# Patient Record
Sex: Female | Born: 1940
Health system: Southern US, Community
[De-identification: ages and names within clinical notes are randomized; demographics above are authoritative.]

## PROBLEM LIST (undated history)

## (undated) DIAGNOSIS — M545 Low back pain: Secondary | ICD-10-CM

## (undated) DIAGNOSIS — J45909 Unspecified asthma, uncomplicated: Secondary | ICD-10-CM

## (undated) DIAGNOSIS — R05 Cough: Secondary | ICD-10-CM

## (undated) DIAGNOSIS — J209 Acute bronchitis, unspecified: Secondary | ICD-10-CM

## (undated) DIAGNOSIS — S6000XA Contusion of unspecified finger without damage to nail, initial encounter: Secondary | ICD-10-CM

## (undated) DIAGNOSIS — R0600 Dyspnea, unspecified: Secondary | ICD-10-CM

## (undated) DIAGNOSIS — D509 Iron deficiency anemia, unspecified: Secondary | ICD-10-CM

## (undated) DIAGNOSIS — M6740 Ganglion, unspecified site: Secondary | ICD-10-CM

## (undated) DIAGNOSIS — N959 Unspecified menopausal and perimenopausal disorder: Secondary | ICD-10-CM

## (undated) DIAGNOSIS — I10 Essential (primary) hypertension: Secondary | ICD-10-CM

## (undated) DIAGNOSIS — E785 Hyperlipidemia, unspecified: Secondary | ICD-10-CM

## (undated) DIAGNOSIS — Z853 Personal history of malignant neoplasm of breast: Secondary | ICD-10-CM

## (undated) DIAGNOSIS — M25569 Pain in unspecified knee: Secondary | ICD-10-CM

## (undated) DIAGNOSIS — R21 Rash and other nonspecific skin eruption: Secondary | ICD-10-CM

## (undated) DIAGNOSIS — M25519 Pain in unspecified shoulder: Secondary | ICD-10-CM

## (undated) DIAGNOSIS — R5383 Other fatigue: Secondary | ICD-10-CM

## (undated) DIAGNOSIS — C50919 Malignant neoplasm of unspecified site of unspecified female breast: Secondary | ICD-10-CM

## (undated) DIAGNOSIS — R5381 Other malaise: Secondary | ICD-10-CM

## (undated) DIAGNOSIS — Z9289 Personal history of other medical treatment: Secondary | ICD-10-CM

## (undated) DIAGNOSIS — E538 Deficiency of other specified B group vitamins: Secondary | ICD-10-CM

## (undated) HISTORY — DX: Other fatigue: R53.83

## (undated) HISTORY — DX: Pain in unspecified shoulder: M25.519

## (undated) HISTORY — DX: Other malaise: R53.81

## (undated) HISTORY — DX: Low back pain: M54.5

## (undated) HISTORY — DX: Rash and other nonspecific skin eruption: R21

## (undated) HISTORY — DX: Contusion of unspecified finger without damage to nail, initial encounter: S60.00XA

## (undated) HISTORY — DX: Hyperlipidemia, unspecified: E78.5

## (undated) HISTORY — DX: Ganglion, unspecified site: M67.40

## (undated) HISTORY — DX: Cough: R05

## (undated) HISTORY — PX: OTHER SURGICAL HISTORY: SHX169

## (undated) HISTORY — DX: Essential (primary) hypertension: I10

## (undated) HISTORY — DX: Pain in unspecified knee: M25.569

## (undated) HISTORY — DX: Personal history of malignant neoplasm of breast: Z85.3

## (undated) HISTORY — DX: Acute bronchitis, unspecified: J20.9

## (undated) HISTORY — DX: Iron deficiency anemia, unspecified: D50.9

## (undated) HISTORY — DX: Deficiency of other specified B group vitamins: E53.8

## (undated) HISTORY — DX: Unspecified menopausal and perimenopausal disorder: N95.9

## (undated) HISTORY — DX: Unspecified asthma, uncomplicated: J45.909

---

## 1998-12-31 ENCOUNTER — Emergency Department (HOSPITAL_COMMUNITY): Admission: EM | Admit: 1998-12-31 | Discharge: 1999-01-01 | Payer: Self-pay

## 2000-12-26 ENCOUNTER — Emergency Department (HOSPITAL_COMMUNITY): Admission: EM | Admit: 2000-12-26 | Discharge: 2000-12-26 | Payer: Self-pay | Admitting: Internal Medicine

## 2003-01-25 ENCOUNTER — Emergency Department (HOSPITAL_COMMUNITY): Admission: EM | Admit: 2003-01-25 | Discharge: 2003-01-25 | Payer: Self-pay | Admitting: Emergency Medicine

## 2003-01-25 ENCOUNTER — Encounter: Payer: Self-pay | Admitting: Emergency Medicine

## 2003-01-31 ENCOUNTER — Emergency Department (HOSPITAL_COMMUNITY): Admission: EM | Admit: 2003-01-31 | Discharge: 2003-01-31 | Payer: Self-pay | Admitting: *Deleted

## 2005-03-11 ENCOUNTER — Ambulatory Visit: Payer: Self-pay | Admitting: Internal Medicine

## 2005-03-18 ENCOUNTER — Ambulatory Visit: Payer: Self-pay | Admitting: Family Medicine

## 2005-04-27 ENCOUNTER — Ambulatory Visit: Payer: Self-pay | Admitting: Internal Medicine

## 2005-06-09 ENCOUNTER — Ambulatory Visit: Payer: Self-pay | Admitting: Internal Medicine

## 2005-07-21 ENCOUNTER — Ambulatory Visit: Payer: Self-pay | Admitting: Internal Medicine

## 2005-09-04 ENCOUNTER — Emergency Department (HOSPITAL_COMMUNITY): Admission: EM | Admit: 2005-09-04 | Discharge: 2005-09-04 | Payer: Self-pay | Admitting: Emergency Medicine

## 2005-12-23 ENCOUNTER — Ambulatory Visit: Payer: Self-pay | Admitting: Internal Medicine

## 2006-09-16 ENCOUNTER — Ambulatory Visit: Payer: Self-pay | Admitting: Internal Medicine

## 2006-09-16 LAB — CONVERTED CEMR LAB
ALT: 16 units/L (ref 0–40)
Basophils Relative: 0.2 % (ref 0.0–1.0)
CO2: 28 meq/L (ref 19–32)
Chloride: 103 meq/L (ref 96–112)
Creatinine, Ser: 0.8 mg/dL (ref 0.4–1.2)
Glucose, Bld: 90 mg/dL (ref 70–99)
Hemoglobin: 11.5 g/dL — ABNORMAL LOW (ref 12.0–15.0)
Ketones, ur: NEGATIVE mg/dL
Leukocytes, UA: NEGATIVE
Lymphocytes Relative: 23.5 % (ref 12.0–46.0)
MCHC: 32.7 g/dL (ref 30.0–36.0)
Monocytes Absolute: 0.4 10*3/uL (ref 0.2–0.7)
Monocytes Relative: 5.6 % (ref 3.0–11.0)
Mucus, UA: NEGATIVE
Neutro Abs: 4.5 10*3/uL (ref 1.4–7.7)
Platelets: 245 10*3/uL (ref 150–400)
Specific Gravity, Urine: 1.015 (ref 1.000–1.03)
TSH: 0.47 microintl units/mL (ref 0.35–5.50)
Urine Glucose: NEGATIVE mg/dL
Urobilinogen, UA: 0.2 (ref 0.0–1.0)
WBC number, urine, microscopy: NONE SEEN /hpf
pH: 7.5 (ref 5.0–8.0)

## 2006-10-24 ENCOUNTER — Ambulatory Visit: Payer: Self-pay | Admitting: Internal Medicine

## 2006-12-23 ENCOUNTER — Ambulatory Visit: Payer: Self-pay | Admitting: Internal Medicine

## 2006-12-23 LAB — CONVERTED CEMR LAB
AST: 21 units/L (ref 0–37)
Albumin: 3.6 g/dL (ref 3.5–5.2)
Bilirubin, Direct: 0.1 mg/dL (ref 0.0–0.3)
Folate: >20 ng/mL
Iron: 36 ug/dL — ABNORMAL LOW (ref 42–145)
Vitamin B-12: 313 pg/mL (ref 211–911)

## 2006-12-28 ENCOUNTER — Ambulatory Visit: Payer: Self-pay | Admitting: Internal Medicine

## 2006-12-28 LAB — CONVERTED CEMR LAB
ALT: 14 units/L (ref 0–40)
Alkaline Phosphatase: 74 units/L (ref 39–117)
BUN: 15 mg/dL (ref 6–23)
Basophils Relative: 0.5 % (ref 0.0–1.0)
CO2: 33 meq/L — ABNORMAL HIGH (ref 19–32)
Calcium: 9.1 mg/dL (ref 8.4–10.5)
Creatinine, Ser: 0.7 mg/dL (ref 0.4–1.2)
Eosinophils Relative: 2.4 % (ref 0.0–5.0)
Glucose, Bld: 93 mg/dL (ref 70–99)
Hemoglobin: 11.9 g/dL — ABNORMAL LOW (ref 12.0–15.0)
Monocytes Absolute: 0.5 10*3/uL (ref 0.2–0.7)
Monocytes Relative: 6.5 % (ref 3.0–11.0)
Platelets: 278 10*3/uL (ref 150–400)
RDW: 11.4 % — ABNORMAL LOW (ref 11.5–14.6)
Total Bilirubin: 0.5 mg/dL (ref 0.3–1.2)
Total Protein: 7.1 g/dL (ref 6.0–8.3)
WBC: 7.7 10*3/uL (ref 4.5–10.5)

## 2007-01-11 ENCOUNTER — Ambulatory Visit: Payer: Self-pay

## 2007-01-12 ENCOUNTER — Ambulatory Visit: Payer: Self-pay

## 2007-01-17 ENCOUNTER — Encounter: Payer: Self-pay | Admitting: Cardiology

## 2007-01-17 ENCOUNTER — Ambulatory Visit: Payer: Self-pay

## 2007-01-26 ENCOUNTER — Ambulatory Visit: Payer: Self-pay | Admitting: Internal Medicine

## 2007-05-05 DIAGNOSIS — I10 Essential (primary) hypertension: Secondary | ICD-10-CM

## 2007-05-05 DIAGNOSIS — D509 Iron deficiency anemia, unspecified: Secondary | ICD-10-CM | POA: Insufficient documentation

## 2007-05-05 HISTORY — DX: Essential (primary) hypertension: I10

## 2007-05-05 HISTORY — DX: Iron deficiency anemia, unspecified: D50.9

## 2007-05-21 DIAGNOSIS — M545 Low back pain, unspecified: Secondary | ICD-10-CM | POA: Insufficient documentation

## 2007-05-21 DIAGNOSIS — E785 Hyperlipidemia, unspecified: Secondary | ICD-10-CM | POA: Insufficient documentation

## 2007-05-21 HISTORY — DX: Low back pain, unspecified: M54.50

## 2007-05-21 HISTORY — DX: Hyperlipidemia, unspecified: E78.5

## 2008-01-31 ENCOUNTER — Ambulatory Visit: Payer: Self-pay | Admitting: Internal Medicine

## 2008-01-31 DIAGNOSIS — J209 Acute bronchitis, unspecified: Secondary | ICD-10-CM

## 2008-01-31 DIAGNOSIS — J45909 Unspecified asthma, uncomplicated: Secondary | ICD-10-CM

## 2008-01-31 HISTORY — DX: Acute bronchitis, unspecified: J20.9

## 2008-01-31 HISTORY — DX: Unspecified asthma, uncomplicated: J45.909

## 2008-04-04 ENCOUNTER — Emergency Department (HOSPITAL_COMMUNITY): Admission: EM | Admit: 2008-04-04 | Discharge: 2008-04-04 | Payer: Self-pay | Admitting: Emergency Medicine

## 2008-05-10 ENCOUNTER — Ambulatory Visit: Payer: Self-pay | Admitting: Internal Medicine

## 2008-05-10 DIAGNOSIS — M25569 Pain in unspecified knee: Secondary | ICD-10-CM

## 2008-05-10 HISTORY — DX: Pain in unspecified knee: M25.569

## 2008-06-05 ENCOUNTER — Telehealth (INDEPENDENT_AMBULATORY_CARE_PROVIDER_SITE_OTHER): Payer: Self-pay | Admitting: *Deleted

## 2008-06-26 ENCOUNTER — Encounter: Payer: Self-pay | Admitting: Internal Medicine

## 2008-08-29 ENCOUNTER — Telehealth: Payer: Self-pay | Admitting: Internal Medicine

## 2008-08-29 ENCOUNTER — Encounter: Payer: Self-pay | Admitting: Internal Medicine

## 2008-09-09 ENCOUNTER — Encounter: Payer: Self-pay | Admitting: Internal Medicine

## 2008-09-12 ENCOUNTER — Encounter: Payer: Self-pay | Admitting: Internal Medicine

## 2008-09-19 ENCOUNTER — Encounter: Admission: RE | Admit: 2008-09-19 | Discharge: 2008-09-19 | Payer: Self-pay | Admitting: General Surgery

## 2008-09-24 ENCOUNTER — Encounter: Payer: Self-pay | Admitting: Internal Medicine

## 2008-10-30 ENCOUNTER — Ambulatory Visit: Payer: Self-pay | Admitting: Internal Medicine

## 2008-10-30 DIAGNOSIS — S6000XA Contusion of unspecified finger without damage to nail, initial encounter: Secondary | ICD-10-CM

## 2008-10-30 HISTORY — DX: Contusion of unspecified finger without damage to nail, initial encounter: S60.00XA

## 2008-12-29 ENCOUNTER — Emergency Department (HOSPITAL_COMMUNITY): Admission: EM | Admit: 2008-12-29 | Discharge: 2008-12-29 | Payer: Self-pay | Admitting: Emergency Medicine

## 2009-03-15 ENCOUNTER — Emergency Department (HOSPITAL_COMMUNITY): Admission: EM | Admit: 2009-03-15 | Discharge: 2009-03-15 | Payer: Self-pay | Admitting: Emergency Medicine

## 2009-05-01 ENCOUNTER — Ambulatory Visit: Payer: Self-pay | Admitting: Internal Medicine

## 2009-05-01 DIAGNOSIS — M6740 Ganglion, unspecified site: Secondary | ICD-10-CM | POA: Insufficient documentation

## 2009-05-01 DIAGNOSIS — R21 Rash and other nonspecific skin eruption: Secondary | ICD-10-CM

## 2009-05-01 HISTORY — DX: Ganglion, unspecified site: M67.40

## 2009-05-01 HISTORY — DX: Rash and other nonspecific skin eruption: R21

## 2009-05-02 ENCOUNTER — Encounter (INDEPENDENT_AMBULATORY_CARE_PROVIDER_SITE_OTHER): Payer: Self-pay | Admitting: *Deleted

## 2009-05-12 ENCOUNTER — Encounter (INDEPENDENT_AMBULATORY_CARE_PROVIDER_SITE_OTHER): Payer: Self-pay | Admitting: *Deleted

## 2009-05-22 ENCOUNTER — Ambulatory Visit: Payer: Self-pay | Admitting: Internal Medicine

## 2009-05-27 ENCOUNTER — Ambulatory Visit: Payer: Self-pay | Admitting: Internal Medicine

## 2009-05-27 ENCOUNTER — Telehealth: Payer: Self-pay | Admitting: Internal Medicine

## 2009-07-08 ENCOUNTER — Telehealth: Payer: Self-pay | Admitting: Internal Medicine

## 2009-07-10 ENCOUNTER — Encounter: Payer: Self-pay | Admitting: Internal Medicine

## 2009-10-17 ENCOUNTER — Ambulatory Visit: Payer: Self-pay | Admitting: Internal Medicine

## 2009-10-17 DIAGNOSIS — R05 Cough: Secondary | ICD-10-CM | POA: Insufficient documentation

## 2009-10-17 DIAGNOSIS — R059 Cough, unspecified: Secondary | ICD-10-CM

## 2009-10-17 HISTORY — DX: Cough, unspecified: R05.9

## 2009-11-14 ENCOUNTER — Ambulatory Visit: Payer: Self-pay | Admitting: Internal Medicine

## 2009-11-14 DIAGNOSIS — Z853 Personal history of malignant neoplasm of breast: Secondary | ICD-10-CM

## 2009-11-14 DIAGNOSIS — M25519 Pain in unspecified shoulder: Secondary | ICD-10-CM

## 2009-11-14 DIAGNOSIS — R5383 Other fatigue: Secondary | ICD-10-CM

## 2009-11-14 DIAGNOSIS — R5381 Other malaise: Secondary | ICD-10-CM

## 2009-11-14 DIAGNOSIS — C50511 Malignant neoplasm of lower-outer quadrant of right female breast: Secondary | ICD-10-CM

## 2009-11-14 HISTORY — DX: Pain in unspecified shoulder: M25.519

## 2009-11-14 HISTORY — DX: Personal history of malignant neoplasm of breast: Z85.3

## 2009-11-14 HISTORY — DX: Other malaise: R53.81

## 2010-02-04 ENCOUNTER — Emergency Department (HOSPITAL_COMMUNITY): Admission: EM | Admit: 2010-02-04 | Discharge: 2010-02-04 | Payer: Self-pay | Admitting: Emergency Medicine

## 2010-02-18 ENCOUNTER — Ambulatory Visit (HOSPITAL_COMMUNITY): Admission: RE | Admit: 2010-02-18 | Discharge: 2010-02-18 | Payer: Self-pay | Admitting: Internal Medicine

## 2010-02-24 ENCOUNTER — Ambulatory Visit: Payer: Self-pay | Admitting: Internal Medicine

## 2010-02-26 ENCOUNTER — Encounter: Admission: RE | Admit: 2010-02-26 | Discharge: 2010-02-26 | Payer: Self-pay | Admitting: Internal Medicine

## 2010-02-26 ENCOUNTER — Encounter: Payer: Self-pay | Admitting: Internal Medicine

## 2010-02-26 LAB — HM MAMMOGRAPHY

## 2010-03-05 ENCOUNTER — Encounter: Payer: Self-pay | Admitting: Internal Medicine

## 2010-05-22 ENCOUNTER — Ambulatory Visit: Payer: Self-pay | Admitting: Internal Medicine

## 2010-05-22 DIAGNOSIS — N959 Unspecified menopausal and perimenopausal disorder: Secondary | ICD-10-CM

## 2010-05-22 HISTORY — DX: Unspecified menopausal and perimenopausal disorder: N95.9

## 2010-05-22 LAB — CONVERTED CEMR LAB
BUN: 9 mg/dL (ref 6–23)
Basophils Relative: 0.2 % (ref 0.0–3.0)
Bilirubin, Direct: 0.1 mg/dL (ref 0.0–0.3)
CO2: 35 meq/L — ABNORMAL HIGH (ref 19–32)
Chloride: 103 meq/L (ref 96–112)
Cholesterol: 192 mg/dL (ref 0–200)
Creatinine, Ser: 0.6 mg/dL (ref 0.4–1.2)
Eosinophils Absolute: 0.1 10*3/uL (ref 0.0–0.7)
HCT: 33.8 % — ABNORMAL LOW (ref 36.0–46.0)
HDL: 51.1 mg/dL (ref >39.00)
Ketones, ur: NEGATIVE mg/dL
Lymphs Abs: 1.7 10*3/uL (ref 0.7–4.0)
MCHC: 33.6 g/dL (ref 30.0–36.0)
MCV: 88.3 fL (ref 78.0–100.0)
Monocytes Absolute: 0.4 10*3/uL (ref 0.1–1.0)
Neutrophils Relative %: 61 % (ref 43.0–77.0)
Platelets: 230 10*3/uL (ref 150.0–400.0)
Potassium: 4.2 meq/L (ref 3.5–5.1)
RBC: 3.83 M/uL — ABNORMAL LOW (ref 3.87–5.11)
Specific Gravity, Urine: 1.015 (ref 1.000–1.030)
Total CHOL/HDL Ratio: 4
Total Protein, Urine: NEGATIVE mg/dL
Total Protein: 6.4 g/dL (ref 6.0–8.3)
Triglycerides: 87 mg/dL (ref 0.0–149.0)
Urine Glucose: NEGATIVE mg/dL
pH: 7.5 (ref 5.0–8.0)

## 2010-05-23 DIAGNOSIS — E538 Deficiency of other specified B group vitamins: Secondary | ICD-10-CM

## 2010-05-23 HISTORY — DX: Deficiency of other specified B group vitamins: E53.8

## 2010-05-25 ENCOUNTER — Ambulatory Visit: Payer: Self-pay | Admitting: Internal Medicine

## 2010-05-25 LAB — CONVERTED CEMR LAB
Folate: 19.7 ng/mL
Iron: 61 ug/dL (ref 42–145)
Saturation Ratios: 23.1 % (ref 20.0–50.0)
Transferrin: 189 mg/dL — ABNORMAL LOW (ref 212.0–360.0)
Vitamin B-12: 179 pg/mL — ABNORMAL LOW (ref 211–911)

## 2010-05-28 ENCOUNTER — Telehealth: Payer: Self-pay | Admitting: Internal Medicine

## 2010-11-01 ENCOUNTER — Encounter: Payer: Self-pay | Admitting: General Surgery

## 2010-11-08 LAB — CONVERTED CEMR LAB
AST: 20 units/L (ref 0–37)
Alkaline Phosphatase: 73 units/L (ref 39–117)
BUN: 10 mg/dL (ref 6–23)
BUN: 10 mg/dL (ref 6–23)
Basophils Absolute: 0 10*3/uL (ref 0.0–0.1)
Basophils Absolute: 0 10*3/uL (ref 0.0–0.1)
Bilirubin Urine: NEGATIVE
CO2: 31 meq/L (ref 19–32)
Calcium: 9.2 mg/dL (ref 8.4–10.5)
Calcium: 9.2 mg/dL (ref 8.4–10.5)
Cholesterol: 154 mg/dL (ref 0–200)
Creatinine, Ser: 0.6 mg/dL (ref 0.4–1.2)
Creatinine, Ser: 0.6 mg/dL (ref 0.4–1.2)
Eosinophils Absolute: 0.1 10*3/uL (ref 0.0–0.7)
GFR calc non Af Amer: 127.42 mL/min (ref >60)
Glucose, Bld: 79 mg/dL (ref 70–99)
Glucose, Bld: 88 mg/dL (ref 70–99)
LDL Cholesterol: 102 mg/dL — ABNORMAL HIGH (ref 0–99)
Lymphocytes Relative: 31.5 % (ref 12.0–46.0)
Lymphocytes Relative: 34.2 % (ref 12.0–46.0)
Lymphs Abs: 2.1 10*3/uL (ref 0.7–4.0)
MCHC: 33.2 g/dL (ref 30.0–36.0)
Monocytes Relative: 5.8 % (ref 3.0–12.0)
Neutrophils Relative %: 57.3 % (ref 43.0–77.0)
Neutrophils Relative %: 60.1 % (ref 43.0–77.0)
Nitrite: NEGATIVE
Platelets: 220 10*3/uL (ref 150.0–400.0)
Platelets: 222 10*3/uL (ref 150.0–400.0)
RDW: 11 % — ABNORMAL LOW (ref 11.5–14.6)
RDW: 11.3 % — ABNORMAL LOW (ref 11.5–14.6)
Saturation Ratios: 38 % (ref 20.0–50.0)
Total Bilirubin: 0.6 mg/dL (ref 0.3–1.2)
Total Protein, Urine: NEGATIVE mg/dL
Transferrin: 156.2 mg/dL — ABNORMAL LOW (ref 212.0–360.0)
VLDL: 11.2 mg/dL (ref 0.0–40.0)

## 2010-11-12 NOTE — Assessment & Plan Note (Signed)
Summary: B12/JOHN/CD   Nurse Visit   Allergies: No Known Drug Allergies  Medication Administration  Injection # 1:    Medication: Vit B12 1000 mcg    Diagnosis: VITAMIN B12 DEFICIENCY (ICD-266.2)    Route: IM    Site: L deltoid    Exp Date: 01/2012    Lot #: [id number]    Mfr: [employer]    Patient tolerated injection without complications    Given by: Zella Ball Ewing CMA (AAMA) (May 25, 2010 11:01 AM)  Orders Added: 1)  Vit B12 1000 mcg [J3420] 2)  Admin of Therapeutic Inj  intramuscular or subcutaneous [16109]

## 2010-11-12 NOTE — Letter (Signed)
Summary: Staff Medical Report  Staff Medical Report   Imported By: Sherian Rein 11/17/2009 09:47:51  _____________________________________________________________________  External Attachment:    Type:   Image     Comment:   External Document

## 2010-11-12 NOTE — Assessment & Plan Note (Signed)
Summary: SNEEZING/NWS   Vital Signs:  Patient profile:   70 year old female Height:      65 inches Weight:      232 pounds BMI:     38.75 O2 Sat:      97 % on Room air Temp:     97.7 degrees F oral Pulse rate:   68 / minute BP sitting:   140 / 78  (left arm) Cuff size:   large  Vitals Entered ByMarland Kitchen Zella Ball Ewing (October 17, 2009 2:48 PM)  O2 Flow:  Room air CC: sneezing,cough/RE   CC:  sneezing and cough/RE.  History of Present Illness: here with several days recent sneezing, cough and headache with hx of pna and worried she might be going there again , although she has no fever, ST, sob/dpe and Pt denies CP, sob, doe, wheezing, orthopnea, pnd, worsening LE edema, palps, dizziness or syncope ; also mentions peak wt was 270, now down another 3 lbs since last visit to current with cont'd better diet and excercise;  Also mentions despite this she has onset mild left knee pain diffusely it seems more anteromedially but without effusion or decreased ROM,  denies fever , truama, falls or hx of gout.  No twisting or recent increased excercise.  Pt denies new neuro symptoms such as headache, facial or extremity weakness   Problems Prior to Update: 1)  Rash-nonvesicular  (ICD-782.1) 2)  Preventive Health Care  (ICD-V70.0) 3)  Ganglion Cyst  (ICD-727.43) 4)  Rash-nonvesicular  (ICD-782.1) 5)  Subungual Hematoma  (ICD-923.3) 6)  Knee Pain, Bilateral  (ICD-719.46) 7)  Bronchitis, Acute  (ICD-466.0) 8)  Asthma  (ICD-493.90) 9)  Family History of Asthma  (ICD-V17.5) 10)  Hyperlipidemia  (ICD-272.4) 11)  Low Back Pain  (ICD-724.2) 12)  Hypertension  (ICD-401.9) 13)  Anemia-iron Deficiency  (ICD-280.9)  Medications Prior to Update: 1)  Lotrisone 1-0.05 % Crea (Clotrimazole-Betamethasone) .... Use Asd Two Times A Day As Needed  Current Medications (verified): 1)  Lotrisone 1-0.05 % Crea (Clotrimazole-Betamethasone) .... 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   Head:  normocephalic and atraumatic.   Eyes:  vision grossly intact, pupils equal, and pupils round.   Ears:  R ear normal and L ear normal.   Nose:  no external deformity and no nasal discharge.   Mouth:  no gingival abnormalities and pharynx pink and moist.   Neck:  supple and no masses.   Lungs:  normal respiratory effort and normal breath sounds.   Heart:  normal rate and regular rhythm.   Msk:  no joint tenderness, no joint swelling, and no joint warmth.  and right knee with mild crepitus but good ROM and no effusion Extremities:  no edema, no erythema    Impression & Recommendations:  Problem # 1:  COUGH (ICD-786.2) most likely due to allergy I suspect with benign exam today; ok for claritin or similar otc as needed;  no evidence infection  today, ok to follow, pt reaasured  Problem # 2:  KNEE PAIN, LEFT (ICD-719.46) prob DJD, mild without effusion, for tylenol as needed   Problem # 3:  HYPERTENSION (ICD-401.9)  BP today: 140/78 Prior BP: 138/84 (05/27/2009)  Labs Reviewed: K+: 3.7 (05/22/2009) Creat: : 0.6 (05/22/2009)   Chol: 154 (05/22/2009)   HDL: 40.90 (05/22/2009)   LDL: 102 (05/22/2009)   TG: 56.0 (05/22/2009) stable overall by hx and exam, ok to continue meds/tx as is   Complete Medication List: 1)  Lotrisone 1-0.05 % Crea (Clotrimazole-betamethasone) .... Use asd two times a day as needed  Patient Instructions: 1)   Continue all previous medications as before this visit  2)  you can take Tylenol Arthritis for the knee pains 3)  please followup in August 2010 with CPX labs

## 2010-11-12 NOTE — Assessment & Plan Note (Signed)
Summary: ?exposure to somebody w/MRSA/cd   Vital Signs:  Patient profile:   70 year old female Height:      65 inches Weight:      219.50 pounds BMI:     36.66 O2 Sat:      96 % on Room air Temp:     97.5 degrees F oral Pulse rate:   67 / minute BP sitting:   130 / 80  (left arm) Cuff size:   large  Vitals Entered By: Zella Ball Ewing CMA (AAMA) (May 22, 2010 1:14 PM)  O2 Flow:  Room air  CC: Exposed to MRSA, labs/RE   CC:  Exposed to MRSA and labs/RE.  History of Present Illness: here to f/u, unfort has not seen gen surgeon in f/u for known right breast cancer invasive distal;  today mentions she had some exposure to a person with known MRSA but not sure about direct contact and denies fever, ST, cough, skin boil or GU symtpoms or other infectious symptoms.   Does have significant worse left knee pain iwth medial swelling with limited walking;  has sees [place] [facility] in the past with cortione shot, and is reluctant, but is willing to go back due to the pain now, and seems somewhat unstable to walk, like it might give way and fall.  Pt denies CP, sob, doe, wheezing, orthopnea, pnd, worsening LE edema, palps, dizziness or syncope  Pt denies new neuro symptoms such as headache, facial or extremity weakness   Problems Prior to Update: 1)  Shoulder Pain, Left  (ICD-719.41) 2)  Fatigue  (ICD-780.79) 3)  Breast Cancer, Hx of  (ICD-V10.3) 4)  Knee Pain, Left  (ICD-719.46) 5)  Cough  (ICD-786.2) 6)  Rash-nonvesicular  (ICD-782.1) 7)  Preventive Health Care  (ICD-V70.0) 8)  Ganglion Cyst  (ICD-727.43) 9)  Rash-nonvesicular  (ICD-782.1) 10)  Subungual Hematoma  (ICD-923.3) 11)  Knee Pain, Bilateral  (ICD-719.46) 12)  Bronchitis, Acute  (ICD-466.0) 13)  Asthma  (ICD-493.90) 14)  Family History of Asthma  (ICD-V17.5) 15)  Hyperlipidemia  (ICD-272.4) 16)  Low Back Pain  (ICD-724.2) 17)  Hypertension  (ICD-401.9) 18)  Anemia-iron Deficiency  (ICD-280.9)  Medications Prior to Update: 1)   Lotrisone 1-0.05 % Crea (Clotrimazole-Betamethasone) .... Use Asd Two Times A Day As Needed  Current Medications (verified): 1)  Lotrisone 1-0.05 % Crea (Clotrimazole-Betamethasone) .... 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         all otherwise negative per pt -    Physical Exam  General:  alert  and overweight-appearing.   Head:  normocephalic and atraumatic.   Eyes:  vision grossly intact, pupils equal, and pupils round.   Ears:  R ear normal and L ear normal.   Nose:  no external deformity and no nasal discharge.   Mouth:  no gingival abnormalities and pharynx pink and moist.   Neck:  supple and no masses.   Lungs:  normal respiratory effort and normal breath sounds.   Heart:  normal rate and regular rhythm.     Abdomen:  soft, non-tender, and normal bowel sounds.   Msk:  no joint tenderness and no joint swelling.  except for left knee mod crepitus with FROM but with medial tenderness and localize sweling Extremities:  no edema, no erythema  Neurologic:  cranial nerves II-XII intact and strength normal in all extremities.   Skin:  color normal and no rashes.   Psych:  not depressed appearing and moderately anxious.     Impression & Recommendations:  Problem # 1:  Preventive Health Care (ICD-V70.0)  Overall doing well, age appropriate education and counseling updated and referral for appropriate preventive services done unless declined, immunizations up to date or declined, diet counseling done if overweight, urged to quit smoking if smokes , most recent labs reviewed and current ordered if appropriate, ecg reviewed or declined (interpretation per ECG scanned in the EMR if done); information regarding Medicare Prevention requirements given if appropriate; speciality referrals updated as appropriate   Orders: TLB-BMP (Basic Metabolic Panel-BMET) (80048-METABOL) TLB-CBC Platelet - w/Differential (85025-CBCD) TLB-Hepatic/Liver Function Pnl (80076-HEPATIC) TLB-Lipid Panel (80061-LIPID) TLB-TSH (Thyroid Stimulating Hormone) (84443-TSH) TLB-Udip ONLY (81003-UDIP)  Problem # 2:  BREAST CANCER, HX OF (ICD-V10.3)  with known recurrence per MRI may 2011 - still needs to see Dr Derrell Lolling, I will refer   Orders: Surgical Referral (Surgery)  Problem # 3:  KNEE PAIN, BILATERAL (ICD-719.46)  needs f/u with incr pain and ? cystic mass to the left medial knee;  will refer back to [place] [facility]  Orders: Orthopedic Surgeon Referral (Ortho Surgeon)  Problem # 4:  MENOPAUSAL DISORDER (ICD-627.9)  due for dxa  Orders: T-Bone Densitometry (16109)  Complete Medication List: 1)  Lotrisone 1-0.05 % Crea (Clotrimazole-betamethasone) .... Use asd two times a day as needed  Patient Instructions: 1)  You will be  contacted about the referral(s) to: Gen Surgeon, and orthopedic 2)  Please go to the Lab in the basement for your blood and/or urine tests today 3)  please schedule your bone density before leaving today 4)  you are given the handicap parking form today 5)  Please schedule a follow-up appointment in 6 months or sooner if needed

## 2010-11-12 NOTE — Letter (Signed)
Summary: [facility]   Imported By: Lester [place] 03/11/2010 10:23:30  _____________________________________________________________________  External Attachment:    Type:   Image     Comment:   External Document

## 2010-11-12 NOTE — Progress Notes (Signed)
Summary: breast cancer referral  Phone Note From Other Clinic Call back at [phone]   Caller: Edie  Request: Talk with Telvin Reinders Summary of Call: Dr. Jacinto Halim office called about pt being non-compliant and not keeping any of her appointments. She was referred in May for her breast cancer and she has not come in yet. They are contacting the office to see if we can contact her so that she can schedule an appointment. Initial call taken by: Brenton Grills MA,  May 28, 2010 9:16 AM  Follow-up for Phone Call        noted -  Follow-up by: Corwin Levins MD,  May 28, 2010 10:13 AM

## 2010-11-12 NOTE — Miscellaneous (Signed)
Summary: BONE DENSITY  Clinical Lists Changes  Orders: Added new Test order of T-Lumbar Vertebral Assessment (77082) - Signed 

## 2010-11-12 NOTE — Letter (Signed)
Summary: [facility]   Imported By: Lester [place] 03/18/2010 09:51:46  _____________________________________________________________________  External Attachment:    Type:   Image     Comment:   External Document

## 2010-11-12 NOTE — Assessment & Plan Note (Signed)
Summary: YEARLY FU/ LABS AFTER / NWS  #   Vital Signs:  Patient profile:   70 year old female Height:      65 inches Weight:      228 pounds BMI:     38.08 O2 Sat:      97 % on Room air Temp:     98.2 degrees F oral Pulse rate:   58 / minute BP sitting:   142 / 74  (left arm) Cuff size:   large  Vitals Entered ByZella Ball Ewing (November 14, 2009 8:52 AM)  O2 Flow:  Room air CC: yearly/RE   CC:  yearly/RE.  History of Present Illness: here after being diagnosed dec 2009 with met breast cancer to right axilla and has been lost to f/u preferring to trying diet, but now wants to be staged and treated;  no obvious further symtpoms such as incresed axillary mass, pulm or bony sympotms such as sob or pain;  Pt denies CP, sob, doe, wheezing, orthopnea, pnd, worsening LE edema, palps, dizziness or syncope  Pt denies new neuro symptoms such as headache, facial or extremity weakness   Declines flu shot today.  Needs a form completed for work purposes, and also with gradually worsening x 4 to 5 months left shoudler pain assoc with a hard knot , with pain worse to forward elevated and abduct and has some limited ROM with this aswell, without hx of fall or injury or known bony mets.  Plans to retire in 6 months .    Problems Prior to Update: 1)  Shoulder Pain, Left  (ICD-719.41) 2)  Fatigue  (ICD-780.79) 3)  Breast Cancer, Hx of  (ICD-V10.3) 4)  Knee Pain, Left  (ICD-719.46) 5)  Cough  (ICD-786.2) 6)  Rash-nonvesicular  (ICD-782.1) 7)  Preventive Health Care  (ICD-V70.0) 8)  Ganglion Cyst  (ICD-727.43) 9)  Rash-nonvesicular  (ICD-782.1) 10)  Subungual Hematoma  (ICD-923.3) 11)  Knee Pain, Bilateral  (ICD-719.46) 12)  Bronchitis, Acute  (ICD-466.0) 13)  Asthma  (ICD-493.90) 14)  Family History of Asthma  (ICD-V17.5) 15)  Hyperlipidemia  (ICD-272.4) 16)  Low Back Pain  (ICD-724.2) 17)  Hypertension  (ICD-401.9) 18)  Anemia-iron Deficiency  (ICD-280.9)  Medications Prior to Update: 1)   Lotrisone 1-0.05 % Crea (Clotrimazole-Betamethasone) .... Use Asd Two Times A Day As Needed  Current Medications (verified): 1)  Lotrisone 1-0.05 % Crea (Clotrimazole-Betamethasone) .... 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   Head:  normocephalic and atraumatic.   Eyes:  vision grossly intact, pupils equal, and pupils round.   Ears:  R ear normal and L ear normal.   Nose:  no external deformity and no nasal discharge.   Mouth:  no gingival abnormalities and pharynx pink and moist.   Neck:  supple and no masses.   Lungs:  normal respiratory effort and normal breath sounds.  Heart:  normal rate and regular rhythm.   Abdomen:  soft, non-tender, normal bowel sounds, no hepatomegaly, and no splenomegaly.   Msk:  no joint tenderness and no joint swelling.  except a tender bony enlargement to the area at or near the left [facility] joint Extremities:  no edema, no erythema  Neurologic:  alert & oriented X3 and cranial nerves II-XII intact.   Cervical Nodes:  no anterior cervical adenopathy and no posterior cervical adenopathy.   Axillary Nodes:  not done Inguinal Nodes:  not done   Impression & Recommendations:  Problem # 1:  BREAST CANCER, HX OF (ICD-V10.3)  ok to refer to oncology for staging and treatment, seems to have been in some kind of denial possibly; but  is convinved after I go over the nov and dec 2009 U/S, MRI and path results.    Orders: Oncology Referral (Oncology)  Problem # 2:  HYPERTENSION (ICD-401.9)  Orders: EKG w/ Interpretation (93000)  BP today: 142/74 Prior BP: 140/78 (10/17/2009)  Labs Reviewed: K+: 3.7 (05/22/2009) Creat: : 0.6 (05/22/2009)   Chol: 154 (05/22/2009)   HDL: 40.90 (05/22/2009)   LDL: 102 (05/22/2009)   TG: 56.0 (05/22/2009) persistent mild elev - declines tx at this time  Problem # 3:  FATIGUE (ICD-780.79) exam benign, to check labs below; follow with expectant management  Orders: TLB-BMP (Basic Metabolic Panel-BMET) (80048-METABOL) TLB-CBC Platelet - w/Differential (85025-CBCD) TLB-IBC Pnl (Iron/FE;Transferrin) (83550-IBC) TLB-B12 + Folate Pnl (82746_82607-B12/FOL) TLB-Sedimentation Rate (ESR) (85652-ESR) TLB-TSH (Thyroid Stimulating Hormone) (84443-TSH)  Problem # 4:  SHOULDER PAIN, LEFT (ICD-719.41)  with bony abnormal - ? ac joint vs other large bone spur - to refer to ortho  Orders: Orthopedic Surgeon Referral (Ortho Surgeon)  Complete Medication List: 1)  Lotrisone 1-0.05 % Crea (Clotrimazole-betamethasone) .... Use asd two times a day as needed  Patient Instructions: 1)  You will be contacted about the referral(s) to: oncology , and orthopedic 2)  Please go to the Lab in the basement for your blood and/or urine tests today 3)  Continue all previous medications as before this visit  4)  Please schedule a follow-up appointment in 6 months. with CPX labs and: 5)  iron/tibc 285.9

## 2010-11-12 NOTE — Assessment & Plan Note (Signed)
Summary: follow up after mammogram-lb   Vital Signs:  Patient profile:   70 year old female Height:      64.5 inches Weight:      232 pounds BMI:     39.35 O2 Sat:      96 % on Room air Temp:     98.2 degrees F oral Pulse rate:   68 / minute BP sitting:   152 / 82  (left arm) Cuff size:   large  Vitals Entered ByZella Ball Ewing (Feb 24, 2010 1:53 PM)  O2 Flow:  Room air CC: followup on mammogram, labs/RE   CC:  followup on mammogram and labs/RE.  History of Present Illness: here after recent mammogram with finding of possilbe right breast mass,  daughter wants to know if the size of the mass is more or less than previous;  pt has f/u evaluation further testing later this wk, denies pain, fever, redness of the area, as well as no recent wt loss, CP, sob, night sweats, fatigue  or other constitutional symptoms.  Pt denies CP, sob, doe, wheezing, orthopnea, pnd, worsening LE edema, palps, dizziness or syncope  Overall good compliace with tx, currently on no meds.  BP at home has been < 140/90.    Problems Prior to Update: 1)  Shoulder Pain, Left  (ICD-719.41) 2)  Fatigue  (ICD-780.79) 3)  Breast Cancer, Hx of  (ICD-V10.3) 4)  Knee Pain, Left  (ICD-719.46) 5)  Cough  (ICD-786.2) 6)  Rash-nonvesicular  (ICD-782.1) 7)  Preventive Health Care  (ICD-V70.0) 8)  Ganglion Cyst  (ICD-727.43) 9)  Rash-nonvesicular  (ICD-782.1) 10)  Subungual Hematoma  (ICD-923.3) 11)  Knee Pain, Bilateral  (ICD-719.46) 12)  Bronchitis, Acute  (ICD-466.0) 13)  Asthma  (ICD-493.90) 14)  Family History of Asthma  (ICD-V17.5) 15)  Hyperlipidemia  (ICD-272.4) 16)  Low Back Pain  (ICD-724.2) 17)  Hypertension  (ICD-401.9) 18)  Anemia-iron Deficiency  (ICD-280.9)  Medications Prior to Update: 1)  Lotrisone 1-0.05 % Crea (Clotrimazole-Betamethasone) .... Use Asd Two Times A Day As Needed  Current Medications (verified): 1)  Lotrisone 1-0.05 % Crea (Clotrimazole-Betamethasone) .... 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   Head:  normocephalic and atraumatic.   Eyes:  vision grossly intact, pupils equal, and pupils round.   Ears:  R ear normal and L ear normal.   Nose:  no external deformity and no nasal discharge.   Mouth:  no gingival abnormalities and pharynx pink and moist.   Neck:  supple and no masses.   Lungs:  normal respiratory effort and normal breath sounds.   Heart:  normal rate and regular rhythm.   Abdomen:  soft, non-tender, and normal bowel sounds.   Extremities:  no edema, no erythema    Impression & Recommendations:  Problem # 1:  BREAST CANCER, HX OF (ICD-V10.3) with recent mammogram abnormal - d/w pt; reassured, to f/u with additional images this thur  Problem # 2:  HYPERTENSION (ICD-401.9)  BP today: 152/82 Prior BP: 142/74 (11/14/2009)  Labs Reviewed: K+: 3.8 (11/14/2009) Creat: : 0.6 (11/14/2009)   Chol: 154 (05/22/2009)   HDL: 40.90 (05/22/2009)   LDL: 102 (05/22/2009)   TG: 56.0 (05/22/2009) has been well controlled until recent;  pt declines meds, to focus on wt loss first, f/u next visit  Problem # 3:  HYPERLIPIDEMIA (ICD-272.4)  Labs Reviewed: SGOT: 20 (05/22/2009)   SGPT: 19 (05/22/2009)   HDL:40.90 (05/22/2009)  LDL:102 (05/22/2009)  Chol:154 (05/22/2009)  Trig:56.0 (05/22/2009) mild, to focus on diet and wt loss ; declines low dose statin at this time  Problem # 4:   ASTHMA (ICD-493.90) stable overall by hx and exam, ok to continue meds/tx as is   Complete Medication List: 1)  Lotrisone 1-0.05 % Crea (Clotrimazole-betamethasone) .... Use asd two times a day as needed  Patient Instructions: 1)  Continue all previous medications as before this visit  2)  Please schedule a follow-up appointment in 3 months with: CPX labs

## 2011-05-18 ENCOUNTER — Ambulatory Visit (INDEPENDENT_AMBULATORY_CARE_PROVIDER_SITE_OTHER): Payer: Medicare Other | Admitting: Internal Medicine

## 2011-05-18 ENCOUNTER — Other Ambulatory Visit (INDEPENDENT_AMBULATORY_CARE_PROVIDER_SITE_OTHER): Payer: Medicare Other

## 2011-05-18 ENCOUNTER — Encounter: Payer: Self-pay | Admitting: Internal Medicine

## 2011-05-18 DIAGNOSIS — E538 Deficiency of other specified B group vitamins: Secondary | ICD-10-CM

## 2011-05-18 DIAGNOSIS — E785 Hyperlipidemia, unspecified: Secondary | ICD-10-CM

## 2011-05-18 DIAGNOSIS — Z0000 Encounter for general adult medical examination without abnormal findings: Secondary | ICD-10-CM

## 2011-05-18 DIAGNOSIS — I10 Essential (primary) hypertension: Secondary | ICD-10-CM

## 2011-05-18 LAB — CBC WITH DIFFERENTIAL/PLATELET
Basophils Relative: 0.3 % (ref 0.0–3.0)
Eosinophils Relative: 1.9 % (ref 0.0–5.0)
HCT: 35.1 % — ABNORMAL LOW (ref 36.0–46.0)
Lymphs Abs: 1.8 10*3/uL (ref 0.7–4.0)
MCV: 90.1 fl (ref 78.0–100.0)
Monocytes Absolute: 0.3 10*3/uL (ref 0.1–1.0)
Monocytes Relative: 5.7 % (ref 3.0–12.0)
Neutrophils Relative %: 57.7 % (ref 43.0–77.0)
Platelets: 222 10*3/uL (ref 150.0–400.0)
RBC: 3.89 Mil/uL (ref 3.87–5.11)
WBC: 5.2 10*3/uL (ref 4.5–10.5)

## 2011-05-18 LAB — BASIC METABOLIC PANEL
BUN: 8 mg/dL (ref 6–23)
Calcium: 8.7 mg/dL (ref 8.4–10.5)
GFR: 202.28 mL/min (ref >60.00)
Glucose, Bld: 88 mg/dL (ref 70–99)

## 2011-05-18 LAB — HEPATIC FUNCTION PANEL
ALT: 15 U/L (ref 0–35)
AST: 21 U/L (ref 0–37)
Bilirubin, Direct: 0 mg/dL (ref 0.0–0.3)
Total Bilirubin: 0.6 mg/dL (ref 0.3–1.2)
Total Protein: 6.7 g/dL (ref 6.0–8.3)

## 2011-05-18 LAB — URINALYSIS, ROUTINE W REFLEX MICROSCOPIC
Bilirubin Urine: NEGATIVE
Hgb urine dipstick: NEGATIVE
Nitrite: NEGATIVE
Total Protein, Urine: NEGATIVE
Urine Glucose: NEGATIVE
pH: 5.5 (ref 5.0–8.0)

## 2011-05-18 LAB — LIPID PANEL: Cholesterol: 155 mg/dL (ref 0–200)

## 2011-05-18 LAB — TSH: TSH: 0.46 u[IU]/mL (ref 0.35–5.50)

## 2011-05-18 NOTE — Patient Instructions (Signed)
Continue all other medications as before Please go to LAB in the Basement for the blood and/or urine tests to be done today Please call the phone number [phone] (the [employer]Tree System) for results of testing in 2-3 days;  When calling, simply dial the number, and when prompted enter the MRN number above (the Medical Record Number) and the # key, then the message should start.  

## 2011-05-18 NOTE — Assessment & Plan Note (Signed)

## 2011-05-18 NOTE — Progress Notes (Signed)
Subjective:    Patient ID: Margaret Rubio, female    DOB: Apr 06, 1941, 70 y.o.   MRN: [id number]  HPI Here for wellness and f/u;  Overall doing ok;  Pt denies CP, worsening SOB, DOE, wheezing, orthopnea, PND, worsening LE edema, palpitations, dizziness or syncope.  Pt denies neurological change such as new Headache, facial or extremity weakness.  Pt denies polydipsia, polyuria, or low sugar symptoms. Pt states overall good compliance with treatment and medications, good tolerability, and trying to follow lower cholesterol diet.  Pt denies worsening depressive symptoms, suicidal ideation or panic. No fever, wt loss, night sweats, loss of appetite, or other constitutional symptoms.  Pt states good ability with ADL's, low fall risk, home safety reviewed and adequate, no significant changes in hearing or vision, and occasionally active with exercise.  Did incidentally have mammogram today with pt reported finding of 6 cm breast mass, was 3 cm last yr and pt has deferred tx so far.. Wt down form 219 to 205 this yr. Past Medical History  Diagnosis Date  . 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 ASTHMA 01/31/2008  . BREAST CANCER, HX OF 11/14/2009  . BRONCHITIS, ACUTE 01/31/2008  . Cough 10/17/2009  . FATIGUE 11/14/2009  . GANGLION CYST 05/01/2009  . HYPERLIPIDEMIA 05/21/2007  . HYPERTENSION 05/05/2007  . LOW BACK PAIN 05/21/2007  . MENOPAUSAL DISORDER 05/22/2010  . Pain in joint, lower leg 05/10/2008  . RASH-NONVESICULAR 05/01/2009  . SHOULDER PAIN, LEFT 11/14/2009  . 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 VITAMIN B12 DEFICIENCY 05/23/2010   Past Surgical History  Procedure Date  . Removal of neck lump     benigh 1986    reports that she has never smoked. She does not have any smokeless tobacco history on file. She reports that she drinks alcohol. Her drug history not on file. family history includes Asthma in her other; Diabetes in her other; and Hypertension in her other. No Known Allergies No current outpatient  prescriptions on file prior to visit.   Review of Systems Review of Systems  Constitutional: Negative for diaphoresis, activity change, appetite change and unexpected weight change.  HENT: Negative for hearing loss, ear pain, facial swelling, mouth sores and neck stiffness.   Eyes: Negative for pain, redness and visual disturbance.  Respiratory: Negative for shortness of breath and wheezing.   Cardiovascular: Negative for chest pain and palpitations.  Gastrointestinal: Negative for diarrhea, blood in stool, abdominal distention and rectal pain.  Genitourinary: Negative for hematuria, flank pain and decreased urine volume.  Musculoskeletal: Negative for myalgias and joint swelling.  Skin: Negative for color change and wound.  Neurological: Negative for syncope and numbness.  Hematological: Negative for adenopathy.  Psychiatric/Behavioral: Negative for hallucinations, self-injury, decreased concentration and agitation.      Objective:   Physical Exam BP 140/80  Pulse 56  Temp(Src) 98.2 F (36.8 C) (Oral)  Ht 5\' 5"  (1.651 m)  Wt 205 lb 6 oz (93.157 kg)  BMI 34.18 kg/m2  SpO2 97% Physical Exam  VS noted Constitutional: Pt is oriented to person, place, and time. Appears well-developed and well-nourished.  HENT:  Head: Normocephalic and atraumatic.  Right Ear: External ear normal.  Left Ear: External ear normal.  Nose: Nose normal.  Mouth/Throat: Oropharynx is clear and moist.  Eyes: Conjunctivae and EOM are normal. Pupils are equal, round, and reactive to light.  Neck: Normal range of motion. Neck supple. No JVD present. No tracheal deviation present.  Cardiovascular: Normal rate, regular rhythm, normal heart sounds and intact  distal pulses.   Pulmonary/Chest: Effort normal and breath sounds normal.  Abdominal: Soft. Bowel sounds are normal. There is no tenderness.  Musculoskeletal: Normal range of motion. Exhibits no edema.  Lymphadenopathy:  Has no cervical adenopathy.    Neurological: Pt is alert and oriented to person, place, and time. Pt has normal reflexes. No cranial nerve deficit.  Skin: Skin is warm and dry. No rash noted.  Psychiatric:  Has  normal mood and affect. Behavior is normal.  except 1+ nervous        Assessment & Plan:

## 2011-05-19 ENCOUNTER — Other Ambulatory Visit: Payer: Self-pay | Admitting: Oncology

## 2011-05-19 ENCOUNTER — Ambulatory Visit: Payer: Self-pay | Admitting: Internal Medicine

## 2011-05-19 ENCOUNTER — Ambulatory Visit (HOSPITAL_BASED_OUTPATIENT_CLINIC_OR_DEPARTMENT_OTHER): Payer: Medicare Other | Admitting: Surgery

## 2011-05-19 ENCOUNTER — Encounter (HOSPITAL_BASED_OUTPATIENT_CLINIC_OR_DEPARTMENT_OTHER): Payer: Medicare Other | Admitting: Oncology

## 2011-05-19 VITALS — BP 153/87 | HR 71 | Temp 97.6°F | Resp 20 | Ht 64.0 in | Wt 202.3 lb

## 2011-05-19 DIAGNOSIS — C50119 Malignant neoplasm of central portion of unspecified female breast: Secondary | ICD-10-CM

## 2011-05-19 DIAGNOSIS — C50919 Malignant neoplasm of unspecified site of unspecified female breast: Secondary | ICD-10-CM

## 2011-05-19 DIAGNOSIS — Z853 Personal history of malignant neoplasm of breast: Secondary | ICD-10-CM

## 2011-05-19 LAB — COMPREHENSIVE METABOLIC PANEL
BUN: 8 mg/dL (ref 6–23)
CO2: 29 mEq/L (ref 19–32)
Glucose, Bld: 80 mg/dL (ref 70–99)
Sodium: 140 mEq/L (ref 135–145)
Total Bilirubin: 0.4 mg/dL (ref 0.3–1.2)
Total Protein: 7.6 g/dL (ref 6.0–8.3)

## 2011-05-19 LAB — CBC WITH DIFFERENTIAL/PLATELET
Basophils Absolute: 0 10*3/uL (ref 0.0–0.1)
Eosinophils Absolute: 0.1 10*3/uL (ref 0.0–0.5)
HCT: 35.1 % (ref 34.8–46.6)
HGB: 11.8 g/dL (ref 11.6–15.9)
LYMPH%: 22 % (ref 14.0–49.7)
MCV: 89 fL (ref 79.5–101.0)
MONO#: 0.3 10*3/uL (ref 0.1–0.9)
NEUT#: 3.4 10*3/uL (ref 1.5–6.5)
NEUT%: 69.8 % (ref 38.4–76.8)
Platelets: 234 10*3/uL (ref 145–400)
WBC: 4.9 10*3/uL (ref 3.9–10.3)

## 2011-05-19 NOTE — Progress Notes (Signed)
Chief Complaint  Patient presents with  . Other    Breast Cancer   MDBC patient.  Has seen Dr. Derrell Lolling in past for her breast cancer, but failed f/u.  Margaret Rubio is a 70 y.o. AA female who is a patient of Margaret Barre, MD, MD and comes to me today for advanced right breast cancer.  The patient has a somewhat convoluted history. It appears that she presented in November 2009. 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  In December of 2009, she underwent a right axillary lymph node biopsy on 09/24/2008 which showed metastatic ductal carcinoma.  (Case No: [id number]) The tumor was ER 100% positive, PR 0% negative, Ki67 - 22%.  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  The ratio of HER-2:CEP 17 was 2.07.  In May 2011 she underwent a mammogram that showed a 3.8 cm no invasive duct carcinoma in the subareolar region of the right breast which had increased since November 2009.  During this time, she was he either scheduled to see or scheduled to have surgery by Dr. Derrell Lolling. However, she failed multiple appointments and sought alternative medical care in [place]. This involved putting something on her right breast that caused some scarring.  Today she is accompanied by her daughter who is visiting from [place]. I think her daughter is helping the patient make decisions and trying to keep her on track.  Past Medical History  Diagnosis Date  . 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 ASTHMA 01/31/2008  . BREAST CANCER, HX OF 11/14/2009  . BRONCHITIS, ACUTE 01/31/2008  . Cough 10/17/2009  . FATIGUE 11/14/2009  . GANGLION CYST 05/01/2009  . HYPERLIPIDEMIA 05/21/2007  . HYPERTENSION 05/05/2007  . LOW BACK PAIN 05/21/2007  . MENOPAUSAL DISORDER 05/22/2010  . Pain in joint, lower leg 05/10/2008  . RASH-NONVESICULAR 05/01/2009  . SHOULDER PAIN, LEFT 11/14/2009  . 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 VITAMIN B12  DEFICIENCY 05/23/2010    Past Surgical History  Procedure Date  . Removal of neck lump     benigh 1986    Current Outpatient Prescriptions  Medication Sig Dispense Refill  . clotrimazole-betamethasone (LOTRISONE) cream Apply topically 2 (two) times daily.        . cyanocobalamin (,VITAMIN B-12,) 1000 MCG/ML injection Inject 1,000 mcg into the muscle every 30 (thirty) days.          No Known Allergies  SOCIAL and FAMILY HISTORY: [name] is accompanied with her daughter. She has failed multiple appointments to manage her breast cancer and understands that she needs to keep her appointments to be appropriately managed.  PHYSICAL EXAM: BP 153/87  Pulse 71  Temp 97.6 F (36.4 C)  Resp 20  Ht 5\' 4"  (1.626 m)  Wt 202 lb 4.8 oz (91.763 kg)  BMI 34.72 kg/m2  HEENT: Normal. Pupils equal. Normal dentition. Neck: Supple. No thyroid mass. Lymph Nodes:  No supraclavicular or cervical nodes.  Fullness in right axilla. Lungs: Clear and symmetric. Breast:  "edema" of right breast.  I do not think this is an inflammatory breast ca, but more of a breast cancer that has grown and received no treatment.  Heart:  RRR. No murmur. Abdomen: No mass. No tenderness. No hernia. Normal bowel sounds.  No abdominal scars. Rectal: Not done. Extremities:  Good strength in upper and lower extremities. Neurologic:  Grossly intact to motor and sensory function.  DATA  REVIEWED: Old pathology reports and office notes.  ASSESSMENT and PLAN: 1. Right breast cancer, locally advanced.  The patient has been scheduled in the past to see Dr. Derrell Lolling for management of this breast cancer. She has failed multiple followup appointments.  I stressed to her the importance of followup in her care. I think she has a better understanding of this, but expresses significant fear of possible radiation therapy.  The daughter was in the room during my discussion with the patient.  I discussed the plan with Dr. Donnie Coffin.  Our plan is:  #1. Repeat right breast biopsy to make sure her underlying markers have not changed.  #2. To evaluate her for systemic disease with PET scan.  #3. The tumor has been strongly estrogen receptor positive in the past. It was also HER2Neu positive. She would be a candidate for neoadjuvant therapy with either antiestrogen therapy or HER-2/neu therapy depending on her new biopsies.  #4. Surgery and/or radiation therapy would follow her neoadjuvant therapy and depend on her response.  2.  Hypertension.

## 2011-05-20 ENCOUNTER — Other Ambulatory Visit: Payer: Self-pay

## 2011-05-24 ENCOUNTER — Encounter (INDEPENDENT_AMBULATORY_CARE_PROVIDER_SITE_OTHER): Payer: Self-pay

## 2011-05-24 ENCOUNTER — Telehealth: Payer: Self-pay

## 2011-05-24 ENCOUNTER — Encounter: Payer: Self-pay | Admitting: Internal Medicine

## 2011-05-24 NOTE — Telephone Encounter (Signed)
Pt's daughter called requesting excuse from work for pt's 08/07 OV. Daughter states she accompanied pt to visit.

## 2011-05-24 NOTE — Telephone Encounter (Signed)
Ok for note 

## 2011-05-25 NOTE — Telephone Encounter (Signed)
Called the patients daughter she no longer needs the letter.

## 2011-05-27 ENCOUNTER — Encounter: Payer: Self-pay | Admitting: Internal Medicine

## 2011-05-28 ENCOUNTER — Encounter (HOSPITAL_COMMUNITY): Payer: Self-pay

## 2011-05-28 ENCOUNTER — Encounter (HOSPITAL_COMMUNITY)
Admission: RE | Admit: 2011-05-28 | Discharge: 2011-05-28 | Disposition: A | Discharge status: Home / Self Care | Payer: Medicare Other | Source: Ambulatory Visit | Attending: Oncology | Admitting: Oncology

## 2011-05-28 ENCOUNTER — Other Ambulatory Visit: Payer: Self-pay | Admitting: Oncology

## 2011-05-28 DIAGNOSIS — C50919 Malignant neoplasm of unspecified site of unspecified female breast: Secondary | ICD-10-CM

## 2011-05-28 DIAGNOSIS — C773 Secondary and unspecified malignant neoplasm of axilla and upper limb lymph nodes: Secondary | ICD-10-CM | POA: Insufficient documentation

## 2011-05-28 DIAGNOSIS — I2510 Atherosclerotic heart disease of native coronary artery without angina pectoris: Secondary | ICD-10-CM | POA: Insufficient documentation

## 2011-05-28 LAB — GLUCOSE, CAPILLARY: Glucose-Capillary: 92 mg/dL (ref 70–99)

## 2011-05-28 MED ORDER — FLUDEOXYGLUCOSE F - 18 (FDG) INJECTION
18.4000 | Freq: Once | INTRAVENOUS | Status: AC | PRN
  Administered 2011-05-28: 18.4 via INTRAVENOUS

## 2011-05-28 MED ORDER — IOHEXOL 300 MG/ML  SOLN
100.0000 mL | Freq: Once | INTRAMUSCULAR | Status: AC | PRN
  Administered 2011-05-28: 100 mL via INTRAVENOUS

## 2011-07-02 NOTE — Telephone Encounter (Signed)
Erroneous encounter

## 2011-07-21 ENCOUNTER — Other Ambulatory Visit: Payer: Self-pay | Admitting: Oncology

## 2011-07-21 ENCOUNTER — Encounter (HOSPITAL_BASED_OUTPATIENT_CLINIC_OR_DEPARTMENT_OTHER): Payer: Medicare Other | Admitting: Oncology

## 2011-07-21 DIAGNOSIS — Z853 Personal history of malignant neoplasm of breast: Secondary | ICD-10-CM

## 2011-07-21 DIAGNOSIS — Z170 Estrogen receptor positive status [ER+]: Secondary | ICD-10-CM

## 2011-07-21 DIAGNOSIS — C50019 Malignant neoplasm of nipple and areola, unspecified female breast: Secondary | ICD-10-CM

## 2011-07-21 DIAGNOSIS — Z5111 Encounter for antineoplastic chemotherapy: Secondary | ICD-10-CM

## 2011-07-22 ENCOUNTER — Ambulatory Visit
Admission: RE | Admit: 2011-07-22 | Discharge: 2011-07-22 | Disposition: A | Discharge status: Home / Self Care | Payer: Medicare Other | Source: Ambulatory Visit | Attending: Oncology | Admitting: Oncology

## 2011-07-22 DIAGNOSIS — Z853 Personal history of malignant neoplasm of breast: Secondary | ICD-10-CM

## 2011-09-01 ENCOUNTER — Encounter (INDEPENDENT_AMBULATORY_CARE_PROVIDER_SITE_OTHER): Payer: Self-pay

## 2011-09-06 ENCOUNTER — Ambulatory Visit: Payer: Medicare Other

## 2011-09-08 ENCOUNTER — Other Ambulatory Visit: Payer: Medicare Other | Admitting: Lab

## 2011-09-08 ENCOUNTER — Encounter: Payer: Medicare Other | Admitting: Hematology & Oncology

## 2011-09-13 ENCOUNTER — Telehealth: Payer: Self-pay | Admitting: *Deleted

## 2011-09-13 NOTE — Telephone Encounter (Signed)
Pt called said sorry for missing 11-28 said she was still in [place] and will call when she gets back or is on the way back to reschedule

## 2011-09-16 ENCOUNTER — Telehealth: Payer: Self-pay | Admitting: *Deleted

## 2011-09-16 ENCOUNTER — Encounter (INDEPENDENT_AMBULATORY_CARE_PROVIDER_SITE_OTHER): Payer: Self-pay | Admitting: Surgery

## 2011-09-16 ENCOUNTER — Ambulatory Visit (INDEPENDENT_AMBULATORY_CARE_PROVIDER_SITE_OTHER): Payer: Medicare Other | Admitting: Surgery

## 2011-09-16 VITALS — BP 146/74 | HR 60 | Temp 97.0°F | Resp 12 | Ht 66.0 in | Wt 196.4 lb

## 2011-09-16 DIAGNOSIS — Z853 Personal history of malignant neoplasm of breast: Secondary | ICD-10-CM

## 2011-09-16 NOTE — Patient Instructions (Signed)
1.  You would be best served with an anti-estrogen treatment.  Then to consider surgery.

## 2011-09-16 NOTE — Progress Notes (Addendum)
MDBC patient  ASSESSMENT and PLAN: 1. Right breast cancer, locally advanced.  The patient has been scheduled in the past to see Dr. Derrell Lolling for management of this breast cancer. She has failed multiple followup appointments.  She was seen at the [facility] on [facility]/19/2011.  Dr. Donnie Coffin saw her with me.   Our plan was:  #1. Repeat right breast biopsy - This was done 05/20/2011 and showed invasive ductal cancer, ER 99%, PR 4%, Ki67-20%, Her2Neu - neg.  #2. PET scan (05/28/2011) - showed no disease outside of right breast and axilla.  #3. She is a candidate for neoadjuvant therapy with antiestrogen therapy, but I am not sure where she is with this.  She saw Dr. Donnie Coffin 07/21/2011.  She tried Femara for 3 weeks, then stopped.  Then she tried one or two shots of Fosladex.  She made an appt for a second opinion with Dr. Myna Hidalgo, but she failed THAT appt 09/08/2011.  [I spoke to [place], filling in for Dr. Renelda Loma nurse. DN 09/16/2011]  #4. Surgery and/or radiation therapy would follow her neoadjuvant therapy and depend on her response.  I spent around 30 minutes explaining to the patient the rationale of our treatment.  I gave her copies of her path report and PET scan report from the summer.  She is going to continue anti-estrogen tx through Dr. Donnie Coffin.  I will see her every two months during her treatment to help time her eventual surgery.  [Dr. Myna Hidalgo called me about Ms. Windmiller.  It does not sound like she has had any treatment since I saw her in Dec. 2012.  But Dr. Myna Hidalgo has seen her back and discussed taking Aromasin, which she is unsure if she will do.  He also talked to her about going ahead with a mastectomy.   So she is going to see me back.  DN 06/30/2012]  2.  Hypertension.  3.  Intentional 80 pound weight loss.  HPI: AARA JACQUOT is a 70 y.o. AA female who is a patient of Oliver Barre, MD, MD and comes to me today for advanced right breast cancer.  The patient has a somewhat convoluted history. It appears  that she presented in November 2009. 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  In December of 2009, she underwent a right axillary lymph node biopsy on 09/24/2008 which showed metastatic ductal carcinoma.  (Case No: [id number]) The tumor was ER 100% positive, PR 0% negative, Ki67 - 22%.  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  The ratio of HER-2:CEP 17 was 2.07.  In May 2011 she underwent a mammogram that showed a 3.8 cm no invasive duct carcinoma in the subareolar region of the right breast which had increased since November 2009.  During this time, she was he either scheduled to see or scheduled to have surgery by Dr. Derrell Lolling. However, she failed multiple appointments and sought alternative medical care in [place]. This involved putting something on her right breast that caused some scarring.  She was seen in the [facility] and a plan was set up to re-stage her, re-biopsy her right breast, and try to treat her with neoadjuvant therapy.  She comes by herself today.  She had two shots of Faslodex, but is not on any oral antiestrogen tx. It appears she tried Femara, but it is unclear how long.  [She saw Donnie Coffin, but then sought a second  opinion with Dr. Myna Hidalgo, but failed the appt.]  She underwent a biopsy on 05/20/2011 which showed invasive ductal cancer, ER 99%, PR 4%, Ki67-20%, Her2Neu - neg. (copies to Dr. Verita Schneiders and Mervin Hack).  PET on 06/28/2011 showed: 1. The patient's known right breast cancer is moderately hypermetabolic. There are multiple nodal metastases within the right axilla and subpectoral stations. 2. Diffuse dermal and trabecular thickening throughout the right breast may represent a component of inflammatory breast cancer. 3. No distant metastases demonstrated. --- Gerrianne Scale, M.D.    Past Medical History  Diagnosis Date  . 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 ASTHMA  01/31/2008  . BREAST CANCER, HX OF 11/14/2009  . BRONCHITIS, ACUTE 01/31/2008  . Cough 10/17/2009  . FATIGUE 11/14/2009  . GANGLION CYST 05/01/2009  . HYPERLIPIDEMIA 05/21/2007  . HYPERTENSION 05/05/2007  . LOW BACK PAIN 05/21/2007  . MENOPAUSAL DISORDER 05/22/2010  . Pain in joint, lower leg 05/10/2008  . RASH-NONVESICULAR 05/01/2009  . SHOULDER PAIN, LEFT 11/14/2009  . 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 VITAMIN B12 DEFICIENCY 05/23/2010  . rt breast ca dx'd 2009    right breast    Past Surgical History  Procedure Date  . Removal of neck lump     benigh 1986  . Myomectomy     No current outpatient prescriptions on file.    No Known Allergies  SOCIAL and FAMILY HISTORY: Her daughter lives in [place].  She is back there.  PHYSICAL EXAM: BP 146/74  Pulse 60  Temp(Src) 97 F (36.1 C) (Temporal)  Resp 12  Ht 5\' 6"  (1.676 m)  Wt 196 lb 6.4 oz (89.086 kg)  BMI 31.70 kg/m2  HEENT: Normal. Pupils equal. Normal dentition. Neck: Supple. No thyroid mass. Lymph Nodes:  No supraclavicular or cervical nodes.  Fullness in right axilla, but I am not sure that I feel a definite mass. Lungs: Clear and symmetric. Breast:  Right:  Thickening of skin with skin color changes.  Vague mass effect in the right breast.  I am not sure that I feel any axillary masses. I still do not think this is an inflammatory breast ca, but more of a breast cancer that has grown and received no treatment.  I think her right breast may be a little better since I saw her this summer.  [I took photos of her breast for comparison in the future.]  Left:  Normal to palpation.  Heart:  RRR. No murmur. Abdomen: No mass. No tenderness. No hernia. Normal bowel sounds.  No abdominal scars. Rectal: Not done. Extremities:  Good strength in upper and lower extremities. Neurologic:  Grossly intact to motor and sensory function.  DATA REVIEWED: PET scan and path report given to patient. I talked to Marylu Lund, sub nurse for Dr. Donnie Coffin.  He is out  of the office.

## 2011-09-16 NOTE — Telephone Encounter (Signed)
Dr. Ovidio Kin called.  He saw the patient today and was wondering about her follow-up with the oncologist.  Let him know that our records indicated that she scheduled an appt. To get a second opinion with Dr. Myna Hidalgo on 11-28, however she did not keep that appt. Dr. Ezzard Standing requested that Dr. Donnie Coffin call him on Monday when Dr. Donnie Coffin returns to the office.  [phone]

## 2011-09-21 ENCOUNTER — Telehealth (INDEPENDENT_AMBULATORY_CARE_PROVIDER_SITE_OTHER): Payer: Self-pay | Admitting: Surgery

## 2011-10-01 NOTE — Telephone Encounter (Signed)
I don't know why this is open.  DN  09/30/2011

## 2011-10-06 ENCOUNTER — Telehealth: Payer: Self-pay | Admitting: *Deleted

## 2011-10-06 NOTE — Telephone Encounter (Signed)
left voice message to inform the patient of the new date and time on 11-01-2011 asked patient to please call back and confirm they did recieve the message

## 2011-10-20 ENCOUNTER — Telehealth: Payer: Self-pay | Admitting: *Deleted

## 2011-10-20 NOTE — Telephone Encounter (Signed)
left voice message to inform the patient of the new date and time of the new appointment

## 2011-11-01 ENCOUNTER — Telehealth: Payer: Self-pay | Admitting: Oncology

## 2011-11-01 ENCOUNTER — Other Ambulatory Visit: Payer: Self-pay | Admitting: Lab

## 2011-11-01 ENCOUNTER — Ambulatory Visit: Payer: Self-pay | Admitting: Oncology

## 2011-11-01 NOTE — Telephone Encounter (Signed)
Pt called to cancel her lab and md appts. Per pt she will call back to r/s.

## 2011-11-02 ENCOUNTER — Ambulatory Visit: Payer: Self-pay | Admitting: Physician Assistant

## 2011-11-02 ENCOUNTER — Other Ambulatory Visit: Payer: Self-pay | Admitting: Lab

## 2011-11-02 ENCOUNTER — Ambulatory Visit: Payer: Self-pay | Admitting: Oncology

## 2011-11-04 ENCOUNTER — Encounter (INDEPENDENT_AMBULATORY_CARE_PROVIDER_SITE_OTHER): Payer: Self-pay | Admitting: Surgery

## 2012-05-08 ENCOUNTER — Telehealth: Payer: Self-pay

## 2012-05-08 ENCOUNTER — Telehealth: Payer: Self-pay | Admitting: *Deleted

## 2012-05-08 DIAGNOSIS — Z0000 Encounter for general adult medical examination without abnormal findings: Secondary | ICD-10-CM

## 2012-05-08 NOTE — Telephone Encounter (Signed)
Labs ordered.

## 2012-05-08 NOTE — Telephone Encounter (Signed)
Pt called requesting CPE labs prior to appt 08/08, please advise.

## 2012-05-08 NOTE — Telephone Encounter (Signed)
Called the patient informed ok to come to the lab and that order has been entered.

## 2012-05-08 NOTE — Telephone Encounter (Signed)
Received message from pt wanting to scheduled an appt with Dr Myna Hidalgo. Spoke to pt and advised her she would need to get a referral from [facility] to see Dr Myna Hidalgo. Pt states she had an appt with Dr Myna Hidalgo in the past and was unable to keep the appt. Pt was wanting to r/s previous appt. Gave pt # to Dr Gustavo Lah office to discuss rescheduling with them.

## 2012-05-09 ENCOUNTER — Telehealth: Payer: Self-pay | Admitting: Hematology & Oncology

## 2012-05-09 ENCOUNTER — Other Ambulatory Visit (INDEPENDENT_AMBULATORY_CARE_PROVIDER_SITE_OTHER): Payer: Self-pay

## 2012-05-09 DIAGNOSIS — Z0000 Encounter for general adult medical examination without abnormal findings: Secondary | ICD-10-CM

## 2012-05-09 LAB — CBC WITH DIFFERENTIAL/PLATELET
Basophils Absolute: 0 10*3/uL (ref 0.0–0.1)
Eosinophils Relative: 1 % (ref 0.0–5.0)
HCT: 33.1 % — ABNORMAL LOW (ref 36.0–46.0)
Lymphs Abs: 1.4 10*3/uL (ref 0.7–4.0)
MCHC: 33 g/dL (ref 30.0–36.0)
MCV: 89.6 fl (ref 78.0–100.0)
Monocytes Absolute: 0.3 10*3/uL (ref 0.1–1.0)
Platelets: 195 10*3/uL (ref 150.0–400.0)
RDW: 11.8 % (ref 11.5–14.6)

## 2012-05-09 LAB — LIPID PANEL
LDL Cholesterol: 84 mg/dL (ref 0–99)
VLDL: 9.8 mg/dL (ref 0.0–40.0)

## 2012-05-09 LAB — BASIC METABOLIC PANEL
GFR: 155.92 mL/min (ref >60.00)
Potassium: 4.3 mEq/L (ref 3.5–5.1)
Sodium: 139 mEq/L (ref 135–145)

## 2012-05-09 LAB — URINALYSIS, ROUTINE W REFLEX MICROSCOPIC
Bilirubin Urine: NEGATIVE
Ketones, ur: NEGATIVE
Leukocytes, UA: NEGATIVE
pH: 7 (ref 5.0–8.0)

## 2012-05-09 LAB — HEPATIC FUNCTION PANEL
AST: 20 U/L (ref 0–37)
Alkaline Phosphatase: 59 U/L (ref 39–117)
Total Bilirubin: 0.5 mg/dL (ref 0.3–1.2)

## 2012-05-09 LAB — TSH: TSH: 0.5 u[IU]/mL (ref 0.35–5.50)

## 2012-05-09 LAB — IBC PANEL: Iron: 80 ug/dL (ref 42–145)

## 2012-05-09 NOTE — Telephone Encounter (Signed)
Patient called and resch missed appt from 09/08/11.  She is back in town and wanted to sch appt, so she sch appt for 06/05/12.

## 2012-05-15 ENCOUNTER — Encounter: Payer: Self-pay | Admitting: Internal Medicine

## 2012-05-15 ENCOUNTER — Ambulatory Visit (INDEPENDENT_AMBULATORY_CARE_PROVIDER_SITE_OTHER): Payer: Medicare Other | Admitting: Internal Medicine

## 2012-05-15 VITALS — BP 140/78 | HR 66 | Temp 98.1°F | Ht 64.0 in | Wt 195.5 lb

## 2012-05-15 DIAGNOSIS — Z0000 Encounter for general adult medical examination without abnormal findings: Secondary | ICD-10-CM

## 2012-05-15 NOTE — Progress Notes (Signed)
Subjective:    Patient ID: Margaret Rubio, female    DOB: 08-27-1941, 71 y.o.   MRN: [id number]  HPI Here for wellness and f/u;  Overall doing ok;  Pt denies CP, worsening SOB, DOE, wheezing, orthopnea, PND, worsening LE edema, palpitations, dizziness or syncope.  Pt denies neurological change such as new Headache, facial or extremity weakness.  Pt denies polydipsia, polyuria, or low sugar symptoms. Pt states overall good compliance with treatment and medications, good tolerability, and trying to follow lower cholesterol diet.  Pt denies worsening depressive symptoms, suicidal ideation or panic. No fever, wt loss, night sweats, loss of appetite, or other constitutional symptoms.  Pt states good ability with ADL's, low fall risk, home safety reviewed and adequate, no significant changes in hearing or vision, and occasionally active with exercise.  Has known breast ca, followed per Dr Ezzard Standing, and has declined surgury or med tx so far.  Has lost 10 lbs since aug 2012, had been as much as 279 per pt about 3 yrs ago.   Does walk for exercise now up to 2 miles about 3 times per wk, but at east one mild per day Past Medical History  Diagnosis Date  . 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 ASTHMA 01/31/2008  . BREAST CANCER, HX OF 11/14/2009  . BRONCHITIS, ACUTE 01/31/2008  . Cough 10/17/2009  . FATIGUE 11/14/2009  . GANGLION CYST 05/01/2009  . HYPERLIPIDEMIA 05/21/2007  . HYPERTENSION 05/05/2007  . LOW BACK PAIN 05/21/2007  . MENOPAUSAL DISORDER 05/22/2010  . Pain in joint, lower leg 05/10/2008  . RASH-NONVESICULAR 05/01/2009  . SHOULDER PAIN, LEFT 11/14/2009  . 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 VITAMIN B12 DEFICIENCY 05/23/2010  . rt breast ca dx'd 2009    right breast   Past Surgical History  Procedure Date  . Removal of neck lump     benigh 1986  . Myomectomy     reports that she has never smoked. She does not have any smokeless tobacco history on file. She reports that she does not drink alcohol or use illicit  drugs. family history includes Asthma in her other; Diabetes in her other; Heart disease in her father; and Hypertension in her father and other. No Known Allergies No current outpatient prescriptions on file prior to visit.   Review of Systems Review of Systems  Constitutional: Negative for diaphoresis, activity change, appetite change and unexpected weight change.  HENT: Negative for hearing loss, ear pain, facial swelling, mouth sores and neck stiffness.   Eyes: Negative for pain, redness and visual disturbance.  Respiratory: Negative for shortness of breath and wheezing.   Cardiovascular: Negative for chest pain and palpitations.  Gastrointestinal: Negative for diarrhea, blood in stool, abdominal distention and rectal pain.  Genitourinary: Negative for hematuria, flank pain and decreased urine volume.  Musculoskeletal: Negative for myalgias and joint swelling.  Skin: Negative for color change and wound.  Neurological: Negative for syncope and numbness.  Hematological: Negative for adenopathy.  Psychiatric/Behavioral: Negative for hallucinations, self-injury, decreased concentration and agitation.      Objective:   Physical Exam BP 140/78  Pulse 66  Temp 98.1 F (36.7 C) (Oral)  Ht 5\' 4"  (1.626 m)  Wt 195 lb 8 oz (88.678 kg)  BMI 33.56 kg/m2  SpO2 97% Physical Exam  VS noted, very pleasant, smiling Constitutional: Pt is oriented to person, place, and time. Appears well-developed and well-nourished.  Head: Normocephalic and atraumatic.  Right Ear: External ear normal.  Left Ear: External ear normal.  Nose:  Nose normal.  Mouth/Throat: Oropharynx is clear and moist.  Eyes: Conjunctivae and EOM are normal. Pupils are equal, round, and reactive to light.  Neck: Normal range of motion. Neck supple. No JVD present. No tracheal deviation present.  Cardiovascular: Normal rate, regular rhythm, normal heart sounds and intact distal pulses.   Pulmonary/Chest: Effort normal and breath  sounds normal.  Abdominal: Soft. Bowel sounds are normal. There is no tenderness.  Musculoskeletal: Normal range of motion. Exhibits no edema.  Lymphadenopathy:  Has no cervical adenopathy.  Neurological: Pt is alert and oriented to person, place, and time. Pt has normal reflexes. No cranial nerve deficit.  Skin: Skin is warm and dry. No rash noted.  Psychiatric:  Has  normal mood and affect. Behavior is normal.     Assessment & Plan:

## 2012-05-15 NOTE — Assessment & Plan Note (Signed)

## 2012-05-15 NOTE — Patient Instructions (Addendum)
Please start Aspirin 81 mg - 1 per day - Enteric Coated only - to help reduce risk of stroke and heart disease Please continue your efforts at being more active, low cholesterol diet, and weight control. Please keep your appointments with your specialists as you have planned Please return in 6 months, or sooner if needed

## 2012-05-18 ENCOUNTER — Encounter: Payer: Medicare Other | Admitting: Internal Medicine

## 2012-06-05 ENCOUNTER — Other Ambulatory Visit: Payer: Self-pay | Admitting: *Deleted

## 2012-06-05 ENCOUNTER — Other Ambulatory Visit (HOSPITAL_BASED_OUTPATIENT_CLINIC_OR_DEPARTMENT_OTHER): Payer: Medicare Other | Admitting: Lab

## 2012-06-05 ENCOUNTER — Ambulatory Visit (HOSPITAL_BASED_OUTPATIENT_CLINIC_OR_DEPARTMENT_OTHER): Payer: Medicare Other | Admitting: Hematology & Oncology

## 2012-06-05 VITALS — BP 145/69 | HR 66 | Temp 98.0°F | Resp 18 | Ht 64.0 in | Wt 194.0 lb

## 2012-06-05 DIAGNOSIS — C50919 Malignant neoplasm of unspecified site of unspecified female breast: Secondary | ICD-10-CM

## 2012-06-05 DIAGNOSIS — C50119 Malignant neoplasm of central portion of unspecified female breast: Secondary | ICD-10-CM

## 2012-06-05 DIAGNOSIS — Z170 Estrogen receptor positive status [ER+]: Secondary | ICD-10-CM

## 2012-06-05 MED ORDER — EXEMESTANE 25 MG PO TABS: 25.0000 mg | ORAL_TABLET | Freq: Every day | ORAL | Status: DC

## 2012-06-05 NOTE — Telephone Encounter (Signed)
error 

## 2012-06-05 NOTE — Progress Notes (Signed)
This office note has been dictated.

## 2012-06-07 ENCOUNTER — Other Ambulatory Visit (HOSPITAL_BASED_OUTPATIENT_CLINIC_OR_DEPARTMENT_OTHER): Payer: Medicare Other

## 2012-06-07 ENCOUNTER — Ambulatory Visit (HOSPITAL_BASED_OUTPATIENT_CLINIC_OR_DEPARTMENT_OTHER)
Admission: RE | Admit: 2012-06-07 | Discharge: 2012-06-07 | Disposition: A | Discharge status: Home / Self Care | Payer: Medicare Other | Source: Ambulatory Visit | Attending: Hematology & Oncology | Admitting: Hematology & Oncology

## 2012-06-07 DIAGNOSIS — C50919 Malignant neoplasm of unspecified site of unspecified female breast: Secondary | ICD-10-CM | POA: Insufficient documentation

## 2012-06-07 DIAGNOSIS — C779 Secondary and unspecified malignant neoplasm of lymph node, unspecified: Secondary | ICD-10-CM | POA: Insufficient documentation

## 2012-06-07 DIAGNOSIS — C7951 Secondary malignant neoplasm of bone: Secondary | ICD-10-CM

## 2012-06-07 MED ORDER — FLUDEOXYGLUCOSE F - 18 (FDG) INJECTION
14.4500 | Freq: Once | INTRAVENOUS | Status: AC | PRN
  Administered 2012-06-07: 14.45 via INTRAVENOUS

## 2012-06-08 NOTE — Progress Notes (Signed)
CC:   Corwin Levins, MD Sandria Bales. Ezzard Standing, M.D.  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  CURRENT THERAPY:  Observation.  INTERIM HISTORY:  Ms. Haskew comes in for her 1st office visit to the [facility].  She had been seen by Dr. Donnie Coffin in the past.  She also has been seeing other doctors.  She apparently has been doing what she felt was best for herself.  She has been recommended to have an mastectomy, which she has refused.  Dr. Donnie Coffin had put her on Femara.  Her tumor was ER/PR positive.  Her tumor was HER-2 negative.  She did take the Femara for only a couple of weeks. She then stopped because of some arthralgias.  She has not seen any doctor since October of last year.  When she last had studies done with lab work, her CA27-29 was 56.  This was back in August.  Her last PET scan was done back in August 2012. Again, the PET scan showed the known right breast cancer.  There was nodal metastasis within the right axilla and some pectoral stations. There was no evidence of distant metastases.  She said that she feels okay.  She has not noticed any problems with her right breast.  There is no pain with right breast.  There is no discharge with right breast.  There is no swelling of the right arm.  She has had no headache.  She has had no cough.  She has had no nausea or vomiting.  There has been no change in bowel or bladder habits.  Again, she just has not felt comfortable going through surgery.  She has also not felt comfortable taking any kind of chemotherapy or other recognized treatment for breast cancer.  She now comes to [facility] to discuss her current status.  PHYSICAL EXAMINATION:  This is a well-developed, well-nourished [place] American female in no obvious distress.  Vital signs:  Temperature of 98, pulse 66, respiratory rate 18, blood pressure 145/69.  Weight is 194.  Head and  neck:  Normocephalic, atraumatic skull.  There are no ocular or oral lesions.  There are no palpable cervical or supraclavicular lymph nodes.  Thyroid is not palpable.  Lungs:  Clear bilaterally.  There are no rales, wheezes or rhonchi.  Cardiac:  Regular rate and rhythm with a normal S1, S2.  There are no murmurs, rubs or bruits.  Abdomen:  Soft with good bowel sounds.  There is no palpable abdominal mass.  There is no fluid wave.  No palpable hepatosplenomegaly.  Breasts:  The left breast with no masses, edema or erythema.  There is no left axillary adenopathy.  Right breast is slightly hyperpigmented.  There may be some slight swelling of the right breast.  There is some firmness in the upper outer quadrant of the right breast.  There is an area of fullness measuring about 2 cm.  No discharge is noted from the right breast.  There is no right axillary adenopathy.  Back:  No tenderness over the spine, ribs, or hips. Extremities:  No clubbing, cyanosis or edema.  No lymphedema is noted in the right arm.  She has good range motion of her joints.  Neurological: No focal neurological deficits.  LABORATORY STUDIES:  White cell count 4.6, hemoglobin 10.9, hematocrit 33.1, platelet count 195.  Her calcium is 9.2 with an albumin of 3.7. Sodium 139, potassium 4.3.  Liver function tests  were normal.  She has had a PET scan done.  The PET scan does show improvement in the right breast lesion. It now measures 2.4 cm with an SUV of 3.4.  There is interval improvement in the right axillary lymphadenopathy.  There is no mediastinal or hilar lymphadenopathy noted on PET scan.  There is no evidence of distant metastasis.  IMPRESSION:  Ms. Olvey is a 71 year old African American female with a ductal carcinoma of the right breast.  She has been doing holistic therapy.  Shockingly enough, she does not have any evidence of metastatic disease.  In fact, it appears as if her tumor has shrunk.  I still  believe that she would benefit from local control.  I still believe that surgery would be the best way to try to help her out to try to help control her tumor locally.  I think that lumpectomy certainly would be feasible with axillary node dissection.  Whether or not she goes for this is another story.  I did recommend that she go on Aromasin.  I think this is very reasonable approach for her.  I think that she would be able to tolerate Aromasin.  As her tumor was ER positive, we should be able to see a response with Aromasin.  I spent a good hour with Ms. Tuman.  She definitely has her own opinions as to what she feels is best.  I will get back with her regarding the results of her PET scan.  Maybe we can convince her to go back to surgery for evaluation and possible lumpectomy.  If not, then I would just keep her on the Aromasin.  I am not sure what radiation therapy would be able to do for [place] since she has asymptomatic disease.  I am not sure she would even go for radiation therapy.  We will plan to have Ms. Slezak come back to see [place] in another 3 weeks.    ______________________________ Josph Macho, M.D. PRE/MEDQ  D:  06/07/2012  T:  06/08/2012  Job:  [id number]

## 2012-06-09 ENCOUNTER — Other Ambulatory Visit (HOSPITAL_BASED_OUTPATIENT_CLINIC_OR_DEPARTMENT_OTHER): Payer: Medicare Other

## 2012-06-14 ENCOUNTER — Telehealth: Payer: Self-pay | Admitting: *Deleted

## 2012-06-14 NOTE — Telephone Encounter (Signed)
Patient called wanting to find out results of recent PET scan.  Dr. Myna Hidalgo read scan and stated that according to scan, does not appear to have spread and actually looks better.  Gave this information to patient

## 2012-06-30 ENCOUNTER — Other Ambulatory Visit (HOSPITAL_BASED_OUTPATIENT_CLINIC_OR_DEPARTMENT_OTHER): Payer: Medicare Other | Admitting: Lab

## 2012-06-30 ENCOUNTER — Ambulatory Visit (HOSPITAL_BASED_OUTPATIENT_CLINIC_OR_DEPARTMENT_OTHER): Payer: Medicare Other | Admitting: Hematology & Oncology

## 2012-06-30 VITALS — BP 136/67 | HR 67 | Temp 98.0°F | Resp 16 | Ht 64.0 in | Wt 198.0 lb

## 2012-06-30 DIAGNOSIS — C50919 Malignant neoplasm of unspecified site of unspecified female breast: Secondary | ICD-10-CM

## 2012-06-30 DIAGNOSIS — C50119 Malignant neoplasm of central portion of unspecified female breast: Secondary | ICD-10-CM

## 2012-06-30 LAB — LACTATE DEHYDROGENASE: LDH: 193 U/L (ref 94–250)

## 2012-06-30 LAB — COMPREHENSIVE METABOLIC PANEL
Albumin: 4 g/dL (ref 3.5–5.2)
CO2: 27 mEq/L (ref 19–32)
Calcium: 9.1 mg/dL (ref 8.4–10.5)
Chloride: 103 mEq/L (ref 96–112)
Glucose, Bld: 79 mg/dL (ref 70–99)
Potassium: 4.4 mEq/L (ref 3.5–5.3)
Sodium: 138 mEq/L (ref 135–145)
Total Protein: 6.6 g/dL (ref 6.0–8.3)

## 2012-06-30 LAB — CBC WITH DIFFERENTIAL (CANCER CENTER ONLY)
Eosinophils Absolute: 0.1 10*3/uL (ref 0.0–0.5)
LYMPH#: 1.8 10*3/uL (ref 0.9–3.3)
LYMPH%: 34.2 % (ref 14.0–48.0)
MCV: 87 fL (ref 81–101)
MONO#: 0.5 10*3/uL (ref 0.1–0.9)
NEUT#: 2.8 10*3/uL (ref 1.5–6.5)
Platelets: 213 10*3/uL (ref 145–400)
RBC: 3.82 10*6/uL (ref 3.70–5.32)
WBC: 5.1 10*3/uL (ref 3.9–10.0)

## 2012-06-30 LAB — CANCER ANTIGEN 27.29: CA 27.29: 28 U/mL (ref 0–39)

## 2012-06-30 MED ORDER — EXEMESTANE 25 MG PO TABS: 25.0000 mg | ORAL_TABLET | Freq: Every day | ORAL | Status: DC

## 2012-06-30 MED ORDER — ERGOCALCIFEROL 1.25 MG (50000 UT) PO CAPS: 50000.0000 [IU] | ORAL_CAPSULE | ORAL | Status: DC

## 2012-06-30 NOTE — Patient Instructions (Signed)
Please see Dr. Ezzard Standing - his office will call you!!  Please take the Vit D once a week!!!  \  Take the Aromasin every day!!!  It will help your breast cancer even if you have surgery!!!

## 2012-06-30 NOTE — Progress Notes (Signed)
This office note has been dictated.

## 2012-07-01 NOTE — Progress Notes (Signed)
CC:   Margaret Levins, MD Margaret Rubio. Margaret Rubio, M.D.  DIAGNOSIS:  Locally-advanced, lymph=node positive by PET scan breast cancer of the right breast.  CURRENT THERAPY:  Observation.  INTERIM HISTORY:  [name] comes in for a 2nd office visit.  She had a PET scan done.  Surprisingly enough, the PET scan did not show any evidence of metastatic disease.  [name] has not been on any treatment for the breast cancer for almost a year.  The PET scan actually showed improvement in the right breast mass.  It measures 2.4 cm.  There is interval improvement in right axillary adenopathy.  No evidence of metabolic disease elsewhere to suggest metastasis.  She feels okay.  She has had no right breast swelling.  There has been no right arm pain or swelling.  She has had no fever.  There has been no cough.  She has had no change in bowel or bladder habits.  There has been no headache.  When we last saw her, her CA27-29 was normal.  PHYSICAL EXAMINATION:  This is a well-developed, well-nourished [place] American female in no obvious distress.  Vital signs 98, pulse 67, respiratory rate 16, blood pressure 136/67.  Weight is 198.  Head and neck:  Normocephalic, atraumatic skull.  There are no ocular or oral lesions.  There are no palpable cervical or supraclavicular lymph nodes. Lungs:  Clear bilaterally.  Cardiac:  Regular rate and rhythm with a normal S1 and S2.  There are no murmurs, rubs or bruits.  Abdomen:  Soft with good bowel sounds.  There is no palpable abdominal mass.  There is no fluid wave.  There is no palpable hepatosplenomegaly.  Back:  No tenderness over the spine, ribs, or hips.  Extremities:  No clubbing, cyanosis or edema.  LABORATORY STUDIES:  White cell count is 5.1, hemoglobin 11.2, hematocrit 33.3, platelet count 213.  IMPRESSION:  [name] is a 71 year old African American female with essentially untreated breast cancer of the right breast.  Again, she does not  have any metastatic disease.  She has not had any real treatment for almost a year.  I convinced her to talk to Dr. Ovidio Kin again.  I told her that if she really wanted to have the best chance of cure that she will need surgery.  Hopefully, she will not need a mastectomy.  I also told her that she really would benefit from antiestrogen therapy. She had been on Femara in the past for only 3 weeks.  She has had a couple of doses of past Faslodex.  I think that Aromasin would be reasonable for her.  I also think that vitamin D might help with any arthralgias that she may get from the Aromasin.  I gave her the prescriptions for the Aromasin and the vitamin D. Hopefully, she will get these filled.  I spoke to Dr. Ovidio Kin.  He will see her.  I want see [name] back in 2 months' time.  Hopefully by then, she would have had her surgery.    ______________________________ Josph Macho, M.D. PRE/MEDQ  D:  06/30/2012  T:  07/01/2012  Job:  [id number]

## 2012-07-12 NOTE — Progress Notes (Signed)
This encounter was created in error - please disregard.

## 2012-08-11 LAB — HM MAMMOGRAPHY

## 2012-08-29 ENCOUNTER — Other Ambulatory Visit: Payer: Medicare Other | Admitting: Lab

## 2012-08-29 ENCOUNTER — Ambulatory Visit: Payer: Medicare Other | Admitting: Hematology & Oncology

## 2012-08-30 ENCOUNTER — Telehealth: Payer: Self-pay | Admitting: Hematology & Oncology

## 2012-08-30 NOTE — Telephone Encounter (Signed)
Called pt to see if she would like to reschedule missed 11-19 appointment. No answer or voice mail sent letter to pt

## 2012-12-15 ENCOUNTER — Ambulatory Visit: Payer: Medicare Other | Admitting: Internal Medicine

## 2012-12-19 ENCOUNTER — Encounter: Payer: Self-pay | Admitting: Internal Medicine

## 2012-12-19 ENCOUNTER — Ambulatory Visit (INDEPENDENT_AMBULATORY_CARE_PROVIDER_SITE_OTHER): Payer: Medicare Other | Admitting: Internal Medicine

## 2012-12-19 VITALS — BP 140/80 | HR 61 | Temp 97.3°F | Ht 65.0 in | Wt 200.0 lb

## 2012-12-19 DIAGNOSIS — E785 Hyperlipidemia, unspecified: Secondary | ICD-10-CM

## 2012-12-19 DIAGNOSIS — Z853 Personal history of malignant neoplasm of breast: Secondary | ICD-10-CM

## 2012-12-19 DIAGNOSIS — M25561 Pain in right knee: Secondary | ICD-10-CM | POA: Insufficient documentation

## 2012-12-19 DIAGNOSIS — I10 Essential (primary) hypertension: Secondary | ICD-10-CM

## 2012-12-19 DIAGNOSIS — Z0000 Encounter for general adult medical examination without abnormal findings: Secondary | ICD-10-CM

## 2012-12-19 NOTE — Assessment & Plan Note (Signed)
stable overall by history and exam, recent data reviewed with pt, and pt to continue medical treatment as before,  to f/u any worsening symptoms or concerns Lab Results  Component Value Date   LDLCALC 84 05/09/2012

## 2012-12-19 NOTE — Patient Instructions (Addendum)
Please see our Patient Care Coordinator to help you make an appt today with Dr Ezzard Standing, who still may be able to help you with surgury if you do not take the medication from Dr Marinell Blight will be contacted regarding the referral for: orthopedic You should also make an appointment with Dr Myna Hidalgo for after seeing Dr Ezzard Standing Please continue all other medications as before, and refills have been done if requested. Please remember to sign up for My Chart if you have not done so, as this will be important to you in the future with finding out test results, communicating by private email, and scheduling acute appointments online when needed. Please return in 6 months, or sooner if needed, with Lab testing done 3-5 days before

## 2012-12-19 NOTE — Assessment & Plan Note (Signed)
stable overall by history and exam, recent data reviewed with pt, and pt to continue medical treatment as before,  to f/u any worsening symptoms or concerns BP Readings from Last 3 Encounters:  12/19/12 140/80  06/30/12 136/67  06/05/12 145/69

## 2012-12-19 NOTE — Assessment & Plan Note (Addendum)
Pt just not willing to take aromasin;  I explained that although this might help prior to surgury, she might benefit from surgury anyway, though may need re-staging at this point, I asked her to make appt with Dr Allene Pyo office before leaving today and she agrees; ideally should also f/u with Dr Myna Hidalgo but suspect she needs to see Dr Ezzard Standing first  Note:  Total time for pt hx, exam, review of record with pt in the room, determination of diagnoses and plan for further eval and tx is > 40 min, with over 50% spent in coordination and counseling of patient

## 2012-12-19 NOTE — Progress Notes (Signed)
Subjective:    Patient ID: Margaret Rubio, female    DOB: 1941/02/04, 72 y.o.   MRN: [id number]  HPI  Here to f/u, has not started the aromasin. Has not seen Dr Ezzard Standing b/c she was under the impression she was required to take the aromasin prior, and she just is too wary of side effect/less quality of life to take the med at all, so did not take at all, and also lost to f/u with Dr Myna Hidalgo as well (suggested for nov 2013).  Thinks right breast mass may be enlarging. C/o mild discomfort but no nipple d/c, and overlying skin has become more "firm" but nonulcerated.  Pt denies chest pain, increased sob or doe, wheezing, orthopnea, PND, increased LE swelling, palpitations, dizziness or syncope.  No known hx of metastasis but last eval was 2012 with PET.   Pt denies fever, wt loss, night sweats, loss of appetite, or other constitutional symptoms  Denies worsening depressive symptoms, suicidal ideation, or panic; has ongoing anxiety.  Also with increased pain/swelling to right knee with tendency to giveaway in the past 1-2 mo, no falls. Past Medical History  Diagnosis Date  . 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 ASTHMA 01/31/2008  . BREAST CANCER, HX OF 11/14/2009  . BRONCHITIS, ACUTE 01/31/2008  . Cough 10/17/2009  . FATIGUE 11/14/2009  . GANGLION CYST 05/01/2009  . HYPERLIPIDEMIA 05/21/2007  . HYPERTENSION 05/05/2007  . LOW BACK PAIN 05/21/2007  . MENOPAUSAL DISORDER 05/22/2010  . Pain in joint, lower leg 05/10/2008  . RASH-NONVESICULAR 05/01/2009  . SHOULDER PAIN, LEFT 11/14/2009  . 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 VITAMIN B12 DEFICIENCY 05/23/2010  . rt breast ca dx'd 2009    right breast   Past Surgical History  Procedure Laterality Date  . Removal of neck lump      benigh 1986  . Myomectomy      reports that she has never smoked. She does not have any smokeless tobacco history on file. She reports that she does not drink alcohol or use illicit drugs. family history includes Asthma in her other; Diabetes  in her other; Heart disease in her father; and Hypertension in her father and other. No Known Allergies Current Outpatient Prescriptions on File Prior to Visit  Medication Sig Dispense Refill  . Ascorbic Acid (VITAMIN C) 100 MG CHEW Chew by mouth every morning.      . ergocalciferol (VITAMIN D2) 50000 UNITS capsule Take 1 capsule (50,000 Units total) by mouth once a week.  4 capsule  12  . exemestane (AROMASIN) 25 MG tablet Take 1 tablet (25 mg total) by mouth daily after breakfast.  30 tablet  6   No current facility-administered medications on file prior to visit.   Review of Systems  Constitutional: Negative for unexpected weight change, or unusual diaphoresis  HENT: Negative for tinnitus.   Eyes: Negative for photophobia and visual disturbance.  Respiratory: Negative for choking and stridor.   Gastrointestinal: Negative for vomiting and blood in stool.  Genitourinary: Negative for hematuria and decreased urine volume.  Musculoskeletal: Negative for acute joint swelling Skin: Negative for color change and wound.  Neurological: Negative for tremors and numbness other than noted  Psychiatric/Behavioral: Negative for decreased concentration or  hyperactivity.       Objective:   Physical Exam BP 140/80  Pulse 61  Temp(Src) 97.3 F (36.3 C) (Oral)  Ht 5\' 5"  (1.651 m)  Wt 200 lb (90.719 kg)  BMI 33.28 kg/m2  SpO2 97% VS noted,  Constitutional:  Pt appears well-developed and well-nourished.  HENT: Head: NCAT.  Right Ear: External ear normal.  Left Ear: External ear normal.  Eyes: Conjunctivae and EOM are normal. Pupils are equal, round, and reactive to light.  Neck: Normal range of motion. Neck supple.  Cardiovascular: Normal rate and regular rhythm.   Pulmonary/Chest: Effort normal and breath sounds normal.  Abd:  Soft, NT, non-distended, + BS Neurological: Pt is alert. Not confused  Psychiatric: Pt behavior is normal. Thought content normal. 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

## 2012-12-19 NOTE — Assessment & Plan Note (Signed)
With OA flare it seems but also with tendency towards giveaway, for ortho eval - will refer

## 2012-12-20 ENCOUNTER — Encounter (INDEPENDENT_AMBULATORY_CARE_PROVIDER_SITE_OTHER): Payer: Self-pay | Admitting: Surgery

## 2012-12-21 ENCOUNTER — Ambulatory Visit (INDEPENDENT_AMBULATORY_CARE_PROVIDER_SITE_OTHER): Payer: Medicare Other | Admitting: Surgery

## 2012-12-21 ENCOUNTER — Encounter (INDEPENDENT_AMBULATORY_CARE_PROVIDER_SITE_OTHER): Payer: Self-pay | Admitting: Surgery

## 2012-12-21 ENCOUNTER — Telehealth (INDEPENDENT_AMBULATORY_CARE_PROVIDER_SITE_OTHER): Payer: Self-pay

## 2012-12-21 VITALS — BP 140/82 | HR 71 | Temp 97.8°F | Resp 18 | Ht 65.04 in | Wt 202.4 lb

## 2012-12-21 DIAGNOSIS — Z853 Personal history of malignant neoplasm of breast: Secondary | ICD-10-CM

## 2012-12-21 NOTE — Progress Notes (Addendum)
Name:  Margaret Rubio DOB:  [birth year] MRN: [id number]  MDBC patient  ASSESSMENT and PLAN: 1. Right breast cancer, locally advanced.  She continues to fail follow up and will not take anti-estrogen meds.  This all presents a delimma.  I discussed the options for breast cancer treatment with the patient.  I discussed the idea of a multidisciplinary approach to the treatment of breast cancer, which includes medical oncology and radiation oncology.  She really is only a candidate for mastectomy, because of the size of her cancer.  And I don't know how much of the skin pigmentation changes could be cancer, though I don't think so.   She said that she put "potices" on her breast and this changed the skin color.  The risks of surgery include, but are not limited to, bleeding, infection, the need for further surgery, and nerve injury.  Dr. Derrell Lolling, Dr. Myna Hidalgo, and I have all tried to get her to consider anti-estrogen tx, which I think could shrink the tumor and make it more resectable.  But she just will not take it.  So I think now is time to go ahead with a mastectomy.  The tumor is mobile.  I think there is a risk of skin involvement.  I discussed this with her.  If her skin is involved, treatment options would be rad tx, anti-hormonal tx (which she has refused to take up until now), or wider skin excision.  She has made her own decisions and not followed our advice at all so far.  [Spoke to Ms Dareen Piano by phone.  She said that she is ready to set up her surgery.  She wondered about how long the drains would stay in (1 to 3 weeks).  She wanted to be recovered by 7/4, which should be okay.  She does not remember me talking about she having a prior positive axillary node.  (09/24/2008 - Case #: [id number])  I think that she needs an axillary dissection.  She has a friend who has lymphedema post axillary surgery and is worried about this.  I again talked about anti-estrogen therapy.  She has refused to take  this in the past.  She wants me to talk to her daughters (twins) - Lorinda Copland - [phone] and Vinisha Faxon - [phone] (who both live in [place]).  Her son, Dorothyann Gibbs, is at her house.  I tried Education officer, environmental and left a message on her phone.  DN 02/06/2013]  2.  Hypertension. 3.  Intentional 80 pound weight loss.  HPI: Margaret Rubio is a 72 y.o. AA female who is a patient of Oliver Barre, MD and comes to me today for advanced right breast cancer.  She seems to do what she wants to do.  She did not remember Dr. Gustavo Lah name.  Dr. Oliver Barre had pushed her to our office to be seen.  She was supposed to follow up with me q 2 months, but that did not happen.   History of Breast Cancer: The patient has a somewhat convoluted history. It appears that she presented in November 2009. 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  In December of 2009, she underwent a right axillary lymph node biopsy on 09/24/2008 which showed metastatic ductal carcinoma.  (Case No: [id number]) The tumor was ER 100% positive, PR 0% negative, Ki67 - 22%.  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  The ratio of HER-2:CEP 17 was 2.07.  In May 2011 she underwent a mammogram that showed a 3.8 cm no invasive duct carcinoma in the subareolar region of the right breast which had increased since November 2009.  During this time, she was he either scheduled to see or scheduled to have surgery by Dr. Derrell Lolling. However, she failed multiple appointments and sought alternative medical care in [place]. This involved putting something on her right breast that caused some scarring.  She was seen in the [facility] and a plan was set up to re-stage her, re-biopsy her right breast, and try to treat her with neoadjuvant therapy.  She comes by herself today.  She had two shots of Faslodex, but is not on any oral antiestrogen tx. It appears she  tried Femara, but it is unclear how long.  [She saw Donnie Coffin, but then sought a second opinion with Dr. Myna Hidalgo, but failed the appt.]  She underwent a biopsy on 05/20/2011 which showed invasive ductal cancer, ER 99%, PR 4%, Ki67-20%, Her2Neu - neg. (copies to Dr. Verita Schneiders and Mervin Hack).  PET on 06/28/2011 showed: 1. The patient's known right breast cancer is moderately hypermetabolic. There are multiple nodal metastases within the right axilla and subpectoral stations. 2. Diffuse dermal and trabecular thickening throughout the right breast may represent a component of inflammatory breast cancer. 3. No distant metastases demonstrated. --- Gerrianne Scale, M.D.  She was seen at the [facility] on 05/19/2011.  Dr. Donnie Coffin saw her with me. Our plan was:  #1. Repeat right breast biopsy - This was done 05/20/2011 and showed invasive ductal cancer, ER 99%, PR 4%, Ki67-20%, Her2Neu - neg.  #2. PET scan (05/28/2011) - showed no disease outside of right breast and axilla.  #3. She is a candidate for neoadjuvant therapy with antiestrogen therapy, but I am not sure where she is with this.  She saw Dr. Donnie Coffin 07/21/2011.  She tried Femara for 3 weeks, then stopped.  Then she tried one or two shots of Fosladex.  She made an appt for a second opinion with Dr. Myna Hidalgo, but she failed THAT appt 09/08/2011.   #4. Surgery and/or radiation therapy would follow her neoadjuvant therapy and depend on her response.  +++++++Dr. Myna Hidalgo called me about Ms. Tercero.  It does not sound like she has had any treatment since I saw her in Dec. 2012.  But Dr. Myna Hidalgo has seen her back and discussed taking Aromasin, which she is unsure if she will do.  He also talked to her about going ahead with a mastectomy.   So she is going to see me back.  DN 06/30/2012    Past Medical History  Diagnosis Date  . 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 ASTHMA 01/31/2008  . BREAST CANCER, HX OF 11/14/2009  . BRONCHITIS, ACUTE 01/31/2008  . Cough 10/17/2009  .  FATIGUE 11/14/2009  . GANGLION CYST 05/01/2009  . HYPERLIPIDEMIA 05/21/2007  . HYPERTENSION 05/05/2007  . LOW BACK PAIN 05/21/2007  . MENOPAUSAL DISORDER 05/22/2010  . Pain in joint, lower leg 05/10/2008  . RASH-NONVESICULAR 05/01/2009  . SHOULDER PAIN, LEFT 11/14/2009  . 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 VITAMIN B12 DEFICIENCY 05/23/2010  . rt breast ca dx'd 2009    right breast    Past Surgical History  Procedure Laterality Date  . Removal of neck lump      benigh 1986  . Myomectomy      Current Outpatient Prescriptions  Medication Sig Dispense Refill  . Ascorbic  Acid (VITAMIN C) 100 MG CHEW Chew by mouth every morning.      . ergocalciferol (VITAMIN D2) 50000 UNITS capsule Take 1 capsule (50,000 Units total) by mouth once a week.  4 capsule  12   No current facility-administered medications for this visit.    No Known Allergies  SOCIAL and FAMILY HISTORY: Widowed Her children live in [place].  She has 3 children, 2 daughters and a son. She travels between here, [facility], and [place].  She has a good friend, who does holistic medicine, who lives in [place]. Niece, Meimei, is a weight loss patient of mine.  PHYSICAL EXAM: BP 140/82  Pulse 71  Temp(Src) 97.8 F (36.6 C) (Temporal)  Resp 18  Ht 5' 5.04" (1.652 m)  Wt 202 lb 6.4 oz (91.808 kg)  BMI 33.64 kg/m2  HEENT: Normal. Pupils equal. Normal dentition. Neck: Supple. No thyroid mass. Lymph Nodes:  No supraclavicular or cervical nodes.  Fullness in right axilla, but I am not sure that I feel a definite mass. Lungs: Clear and symmetric. Breast:  Right:  Thickening of skin with skin color changes.  Mass at 5 o'clock right breast.  I am not sure that I feel any axillary masses.  Not fixed.  [I took photos of her breast for comparison in the future.  There are also photos in Epic from my last visit.]  Left:  Normal to palpation.  Heart:  RRR. No murmur. Abdomen: No mass. No tenderness. No hernia. Normal bowel sounds.  No abdominal  scars. Extremities:  Good strength in upper and lower extremities. Neurologic:  Grossly intact to motor and sensory function.  DATA REVIEWED: Notes in Epic.  Ovidio Kin, MD, [facility] Pager: [phone] Office phone:  [phone]

## 2012-12-25 NOTE — Telephone Encounter (Signed)
error 

## 2012-12-28 ENCOUNTER — Encounter (INDEPENDENT_AMBULATORY_CARE_PROVIDER_SITE_OTHER): Payer: Self-pay | Admitting: Surgery

## 2013-01-03 ENCOUNTER — Telehealth (INDEPENDENT_AMBULATORY_CARE_PROVIDER_SITE_OTHER): Payer: Self-pay

## 2013-01-03 NOTE — Telephone Encounter (Signed)
Patient states she is waiting for her children to call and let her know the dates they are  available so she will have assistance after surgery. I advised her to call soon.

## 2013-01-10 ENCOUNTER — Telehealth: Payer: Self-pay | Admitting: Hematology & Oncology

## 2013-01-10 NOTE — Telephone Encounter (Signed)
Left pt message to call for appointment °

## 2013-01-12 ENCOUNTER — Telehealth: Payer: Self-pay | Admitting: Hematology & Oncology

## 2013-01-12 NOTE — Telephone Encounter (Signed)
Dr. Myna Hidalgo aware I left message for pt to call for appointment on 01-10-13. He said to let her call us back.

## 2013-04-30 ENCOUNTER — Telehealth (INDEPENDENT_AMBULATORY_CARE_PROVIDER_SITE_OTHER): Payer: Self-pay | Admitting: Surgery

## 2013-04-30 NOTE — Telephone Encounter (Signed)
Left voice mail for patient to call back about surgery, will need to be seen in office again.

## 2013-09-26 ENCOUNTER — Encounter: Payer: Self-pay | Admitting: Family Medicine

## 2013-09-26 ENCOUNTER — Other Ambulatory Visit (INDEPENDENT_AMBULATORY_CARE_PROVIDER_SITE_OTHER): Payer: Medicare Other

## 2013-09-26 ENCOUNTER — Encounter: Payer: Self-pay | Admitting: Internal Medicine

## 2013-09-26 ENCOUNTER — Ambulatory Visit (INDEPENDENT_AMBULATORY_CARE_PROVIDER_SITE_OTHER): Payer: Medicare Other | Admitting: Family Medicine

## 2013-09-26 ENCOUNTER — Ambulatory Visit (INDEPENDENT_AMBULATORY_CARE_PROVIDER_SITE_OTHER): Payer: Medicare Other | Admitting: Internal Medicine

## 2013-09-26 VITALS — BP 138/80 | HR 58 | Ht 65.0 in | Wt 186.0 lb

## 2013-09-26 VITALS — BP 138/80 | HR 58 | Temp 98.4°F | Ht 65.0 in | Wt 186.8 lb

## 2013-09-26 DIAGNOSIS — Z0000 Encounter for general adult medical examination without abnormal findings: Secondary | ICD-10-CM

## 2013-09-26 DIAGNOSIS — M25569 Pain in unspecified knee: Secondary | ICD-10-CM

## 2013-09-26 DIAGNOSIS — M1710 Unilateral primary osteoarthritis, unspecified knee: Secondary | ICD-10-CM | POA: Insufficient documentation

## 2013-09-26 DIAGNOSIS — M1711 Unilateral primary osteoarthritis, right knee: Secondary | ICD-10-CM

## 2013-09-26 DIAGNOSIS — Z136 Encounter for screening for cardiovascular disorders: Secondary | ICD-10-CM

## 2013-09-26 DIAGNOSIS — E559 Vitamin D deficiency, unspecified: Secondary | ICD-10-CM | POA: Insufficient documentation

## 2013-09-26 DIAGNOSIS — I10 Essential (primary) hypertension: Secondary | ICD-10-CM

## 2013-09-26 DIAGNOSIS — E538 Deficiency of other specified B group vitamins: Secondary | ICD-10-CM

## 2013-09-26 DIAGNOSIS — E785 Hyperlipidemia, unspecified: Secondary | ICD-10-CM

## 2013-09-26 DIAGNOSIS — M25561 Pain in right knee: Secondary | ICD-10-CM

## 2013-09-26 DIAGNOSIS — D649 Anemia, unspecified: Secondary | ICD-10-CM | POA: Insufficient documentation

## 2013-09-26 LAB — URINALYSIS, ROUTINE W REFLEX MICROSCOPIC
Hgb urine dipstick: NEGATIVE
Ketones, ur: NEGATIVE
Specific Gravity, Urine: 1.01 (ref 1.000–1.030)
Total Protein, Urine: NEGATIVE
Urine Glucose: NEGATIVE
Urobilinogen, UA: 0.2 (ref 0.0–1.0)
pH: 7.5 (ref 5.0–8.0)

## 2013-09-26 LAB — CBC WITH DIFFERENTIAL/PLATELET
Basophils Absolute: 0 10*3/uL (ref 0.0–0.1)
Eosinophils Absolute: 0.1 10*3/uL (ref 0.0–0.7)
Eosinophils Relative: 1 % (ref 0.0–5.0)
HCT: 35.9 % — ABNORMAL LOW (ref 36.0–46.0)
Hemoglobin: 11.9 g/dL — ABNORMAL LOW (ref 12.0–15.0)
Lymphocytes Relative: 26.3 % (ref 12.0–46.0)
Lymphs Abs: 1.3 10*3/uL (ref 0.7–4.0)
MCHC: 33.2 g/dL (ref 30.0–36.0)
MCV: 87.9 fl (ref 78.0–100.0)
Monocytes Absolute: 0.3 10*3/uL (ref 0.1–1.0)
Neutro Abs: 3.4 10*3/uL (ref 1.4–7.7)
RBC: 4.09 Mil/uL (ref 3.87–5.11)
RDW: 12 % (ref 11.5–14.6)

## 2013-09-26 LAB — BASIC METABOLIC PANEL
Calcium: 8.7 mg/dL (ref 8.4–10.5)
GFR: 136.28 mL/min (ref >60.00)
Potassium: 3.6 mEq/L (ref 3.5–5.1)
Sodium: 135 mEq/L (ref 135–145)

## 2013-09-26 LAB — IBC PANEL
Iron: 72 ug/dL (ref 42–145)
Saturation Ratios: 29.2 % (ref 20.0–50.0)
Transferrin: 175.9 mg/dL — ABNORMAL LOW (ref 212.0–360.0)

## 2013-09-26 LAB — LIPID PANEL
Cholesterol: 165 mg/dL (ref 0–200)
HDL: 49.5 mg/dL (ref >39.00)
Total CHOL/HDL Ratio: 3
VLDL: 19.6 mg/dL (ref 0.0–40.0)

## 2013-09-26 LAB — HEPATIC FUNCTION PANEL
Albumin: 4 g/dL (ref 3.5–5.2)
Alkaline Phosphatase: 65 U/L (ref 39–117)
Bilirubin, Direct: 0.1 mg/dL (ref 0.0–0.3)

## 2013-09-26 MED ORDER — ASPIRIN EC 81 MG PO TBEC: 81.0000 mg | DELAYED_RELEASE_TABLET | Freq: Every day | ORAL | Status: DC

## 2013-09-26 NOTE — Progress Notes (Signed)
Subjective:    Patient ID: Margaret Rubio, female    DOB: [birth year], 72 y.o.   MRN: [id number]  HPI  Here for wellness and f/u;  Overall doing ok;  Pt denies CP, worsening SOB, DOE, wheezing, orthopnea, PND, worsening LE edema, palpitations, dizziness or syncope.  Pt denies neurological change such as new headache, facial or extremity weakness.  Pt denies polydipsia, polyuria, or low sugar symptoms. Pt states overall good compliance with treatment and medications, good tolerability, and has been trying to follow lower cholesterol diet.  Pt denies worsening depressive symptoms, suicidal ideation or panic. No fever, night sweats, wt loss, loss of appetite, or other constitutional symptoms.  Pt states good ability with ADL's, has low fall risk, home safety reviewed and adequate, no other significant changes in hearing or vision, and only occasionally active with exercise.  Has bilat knee pain right > left with ? Effusion on the right, tendency towards giveaway but without falls Past Medical History  Diagnosis Date  . 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 ASTHMA 01/31/2008  . BREAST CANCER, HX OF 11/14/2009  . BRONCHITIS, ACUTE 01/31/2008  . Cough 10/17/2009  . FATIGUE 11/14/2009  . GANGLION CYST 05/01/2009  . HYPERLIPIDEMIA 05/21/2007  . HYPERTENSION 05/05/2007  . LOW BACK PAIN 05/21/2007  . MENOPAUSAL DISORDER 05/22/2010  . Pain in joint, lower leg 05/10/2008  . RASH-NONVESICULAR 05/01/2009  . SHOULDER PAIN, LEFT 11/14/2009  . 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 VITAMIN B12 DEFICIENCY 05/23/2010  . rt breast ca dx'd 2009    right breast   Past Surgical History  Procedure Laterality Date  . Removal of neck lump      benigh 1986  . Myomectomy      reports that she has never smoked. She does not have any smokeless tobacco history on file. She reports that she does not drink alcohol or use illicit drugs. family history includes Asthma in her other; Diabetes in her other; Heart disease in her father;  Hypertension in her father and other. No Known Allergies Current Outpatient Prescriptions on File Prior to Visit  Medication Sig Dispense Refill  . Ascorbic Acid (VITAMIN C) 100 MG CHEW Chew by mouth every morning.      . ergocalciferol (VITAMIN D2) 50000 UNITS capsule Take 1 capsule (50,000 Units total) by mouth once a week.  4 capsule  12   No current facility-administered medications on file prior to visit.   Review of Systems Constitutional: Negative for diaphoresis, activity change, appetite change or unexpected weight change.  HENT: Negative for hearing loss, ear pain, facial swelling, mouth sores and neck stiffness.   Eyes: Negative for pain, redness and visual disturbance.  Respiratory: Negative for shortness of breath and wheezing.   Cardiovascular: Negative for chest pain and palpitations.  Gastrointestinal: Negative for diarrhea, blood in stool, abdominal distention or other pain Genitourinary: Negative for hematuria, flank pain or change in urine volume.  Musculoskeletal: Negative for myalgias and joint swelling.  Skin: Negative for color change and wound.  Neurological: Negative for syncope and numbness. other than noted Hematological: Negative for adenopathy.  Psychiatric/Behavioral: Negative for hallucinations, self-injury, decreased concentration and agitation.      Objective:   Physical Exam BP 138/80  Pulse 58  Temp(Src) 98.4 F (36.9 C) (Oral)  Ht 5\' 5"  (1.651 m)  Wt 186 lb 12 oz (84.709 kg)  BMI 31.08 kg/m2  SpO2 98% VS noted,  Constitutional: Pt is oriented to person, place, and time. Appears well-developed and well-nourished.  Head: Normocephalic and atraumatic.  Right Ear: External ear normal.  Left Ear: External ear normal.  Nose: Nose normal.  Mouth/Throat: Oropharynx is clear and moist.  Eyes: Conjunctivae and EOM are normal. Pupils are equal, round, and reactive to light.  Neck: Normal range of motion. Neck supple. No JVD present. No tracheal  deviation present.  Cardiovascular: Normal rate, regular rhythm, normal heart sounds and intact distal pulses.   Pulmonary/Chest: Effort normal and breath sounds normal.  Abdominal: Soft. Bowel sounds are normal. There is no tenderness. No HSM  Musculoskeletal: Normal range of motion. Exhibits no edema.  Lymphadenopathy:  Has no cervical adenopathy.  Neurological: Pt is alert and oriented to person, place, and time. Pt has normal reflexes. No cranial nerve deficit.  Skin: Skin is warm and dry. No rash noted.  Psychiatric:  Has  normal mood and affect. Behavior is normal.  Right knee with trace effusion, crepitusm left with crepitus as well    Assessment & Plan:

## 2013-09-26 NOTE — Patient Instructions (Signed)
Very nice to meet you Try exercises I am giving you for your knee and shoulder.  Take tylenol 650 mg three times a day is the best evidence based medicine we have for arthritis.  Glucosamine sulfate 750mg  twice a day is a supplement that has been shown to help moderate to severe arthritis. Vitamin D 1000-2000 IU daily Fish oil 2 grams daily.  Tumeric 500mg  twice daily.  Capsaicin topically up to four times a day may also help with pain. Cortisone injections are an option if these interventions do not seem to make a difference or need more relief.  If cortisone injections do not help, there are different types of shots that may help but they take longer to take effect.  We can discuss this at follow up.  It's important that you continue to stay active. Controlling your weight is important.  Consider physical therapy to strengthen muscles around the joint that hurts to take pressure off of the joint itself. Shoe inserts with good arch support may be helpful.  [employer] [facility] [employer] [facility] could help.  Water aerobics and cycling with low resistance are the best two types of exercise for arthritis. Come back and see me in 4 weeks.

## 2013-09-26 NOTE — Progress Notes (Signed)
I'm seeing this patient by the request  of:  Oliver Barre, MD   CC: Right knee pain  HPI: Patient is a very pleasant 72 year old female who is coming in with bilateral knee pain right greater than left. Patient states that it hurts more on the inside of her right knee. Patient does not remember any true injury. Patient states that the pain seems to be more constant now and describes it as a dull aching sensation. Patient denies any giving out on her but states that it does swell from time to time. Patient denies any radiation or numbness of the lower extremity. Patient denies any redness, fevers chills or any abnormal weight loss. Patient though has lost a significant amount of weight on purpose. Patient felt that there could be potentially a mass on the inside of his knee but she did feel. Denies that this is painful to. Patient is still able to do light duty today living. Denies any nighttime awakening. Patient rates the pain 5/10 on severity.  Past medical, surgical, family and social history reviewed. Medications reviewed all in the electronic medical record.   Review of Systems: No headache, visual changes, nausea, vomiting, diarrhea, constipation, dizziness, abdominal pain, skin rash, fevers, chills, night sweats, weight loss, swollen lymph nodes, body aches, joint swelling, muscle aches, chest pain, shortness of breath, mood changes.   Objective:    Blood pressure 138/80, pulse 58, height 5\' 5"  (1.651 m), weight 186 lb (84.369 kg).   General: No apparent distress alert and oriented x3 mood and affect normal, dressed appropriately.  HEENT: Pupils equal, extraocular movements intact Respiratory: Patient's speak in full sentences and does not appear short of breath Cardiovascular: No lower extremity edema, non tender, no erythema Skin: Warm dry intact with no signs of infection or rash on extremities or on axial skeleton. Abdomen: Soft nontender Neuro: Cranial nerves II through XII are  intact, neurovascularly intact in all extremities with 2+ DTRs and 2+ pulses. Lymph: No lymphadenopathy of posterior or anterior cervical chain or axillae bilaterally.  Gait normal with good balance and coordination.  MSK: Non tender with full range of motion and good stability and symmetric strength and tone of shoulders, elbows, wrist, and ankles bilaterally.  Knee: Right Normal to inspection with no erythema or effusion or obvious bony abnormalities. Patient though does have osteoarthritic changes of the knees bilaterally Palpation normal with no warmth, but does have joint line tenderness, but no patellar tenderness, or condyle tenderness. ROM full in flexion and extension and lower leg rotation. Ligaments with solid consistent endpoints including ACL, PCL, LCL, MCL. Negative Mcmurray's, Apley's, and Thessalonian tests. Painful patellar compression. Patellar glide with marked crepitus. Patellar and quadriceps tendons unremarkable. Hamstring and quadriceps strength is normal.  Contralateral knee is fairly similar with less joint line tenderness there is no mass that is appreciated on the medial aspect.  MSK US performed of: Right knee This study was ordered, performed, and interpreted by Terrilee Files D.O.  Knee: All structures visualized. Patient does have significant osteophytic changes with joint space narrowing of the medial and lateral joints. Anteromedial, anterolateral, posteromedial, and posterolateral menisci unremarkable without tearing,, effusion, or displacement. Patient's meniscus do appeared degenerative with some mild fraying Patellar Tendon unremarkable on long and transverse views without effusion. No abnormality of prepatellar bursa. LCL and MCL unremarkable on long and transverse views. No abnormality of origin of medial or lateral head of the gastrocnemius.  IMPRESSION:  Osteoarthritis right knee   Impression and Recommendations:  This case required medical  decision making of moderate complexity.

## 2013-09-26 NOTE — Patient Instructions (Addendum)
Please consider returning for a Nurse Visit at your convenience for the new Prevnar Pneumonia shot Please start the Aspirin 81 mg - 1 per day - Enteric coated only Please continue all other medications as before, and refills have been done if requested. Please have the pharmacy call with any other refills you may need. Please continue your efforts at being more active, low cholesterol diet, and weight control. You are otherwise up to date with prevention measures today.  Please go to the LAB in the Basement (turn left off the elevator) for the tests to be done today  You will be contacted by phone if any changes need to be made immediately.  Otherwise, you will receive a letter about your results with an explanation, but please check with MyChart first  Please remember to sign up for My Chart if you have not done so, as this will be important to you in the future with finding out test results, communicating by private email, and scheduling acute appointments online when needed.  Please return in 1 year for your yearly visit, or sooner if needed  You have a 930AM appt with Dr Katrinka Blazing for your knees

## 2013-09-26 NOTE — Assessment & Plan Note (Signed)
For sport med referral later todday

## 2013-09-26 NOTE — Progress Notes (Signed)
Pre-visit discussion using our clinic review tool. No additional management support is needed unless otherwise documented below in the visit note.  

## 2013-09-26 NOTE — Assessment & Plan Note (Signed)
Patient does have appears to be osteoarthritis of the right knee. Patient does have some degenerative tearing of the meniscus but does not appear that that is what giving her pain. Patient is ambulating without any significant discomfort at this time. Patient was given multiple different treatment options and prognosis. Patient elected try conservative therapy and was given a list of over-the-counter medications that can be beneficial. We discussed icing protocol and was given a home exercise program. Patient will return again in 3 weeks. If she continues to have pain we'll consider doing a steroid injection as well as getting x-rays.

## 2013-09-26 NOTE — Assessment & Plan Note (Addendum)

## 2013-09-27 ENCOUNTER — Encounter: Payer: Self-pay | Admitting: Internal Medicine

## 2013-09-27 LAB — VITAMIN D 25 HYDROXY (VIT D DEFICIENCY, FRACTURES): Vit D, 25-Hydroxy: <10 ng/mL — ABNORMAL LOW (ref 30–89)

## 2013-10-29 ENCOUNTER — Ambulatory Visit (INDEPENDENT_AMBULATORY_CARE_PROVIDER_SITE_OTHER): Payer: Medicare Other | Admitting: Family Medicine

## 2013-10-29 ENCOUNTER — Encounter: Payer: Self-pay | Admitting: Family Medicine

## 2013-10-29 VITALS — BP 138/76 | HR 67 | Temp 98.5°F | Resp 16 | Wt 185.0 lb

## 2013-10-29 DIAGNOSIS — R5383 Other fatigue: Secondary | ICD-10-CM

## 2013-10-29 DIAGNOSIS — R5381 Other malaise: Secondary | ICD-10-CM

## 2013-10-29 DIAGNOSIS — M25569 Pain in unspecified knee: Secondary | ICD-10-CM

## 2013-10-29 NOTE — Patient Instructions (Signed)
It is wonderful to see you! Continue your vitamins.  Vitamin D 2000IU daily Iron 325 mg daily.  Consider adding a soy protein. Look into soy protein supplements.  Try exercises most days of the week for your arms Come back in 4 weeks.

## 2013-10-29 NOTE — Assessment & Plan Note (Signed)
Patient is responding well to conservative therapy as well as weight loss. Encourage her to start vitamin D on a regular basis which I think will be beneficial as well, discussed over-the-counter orthotics they can help relieve some of the stress from regular activities, patient with followup again in 4 weeks for further evaluation. Once again if she has any pain we can was consider injections as well as formal physical therapy.

## 2013-10-29 NOTE — Progress Notes (Signed)
Pre-visit discussion using our clinic review tool. No additional management support is needed unless otherwise documented below in the visit note.  

## 2013-10-29 NOTE — Progress Notes (Signed)
  CC: Right knee pain follow up  HPI: Patient is a very pleasant 73 year old female who is coming in for followup of bilateral knee pain right greater than left. Patient was seen previously one month ago. Patient was given exercises to try to do and states that she is doing significantly better. Patient denies any worsening pain, any numbness, any radiation. Patient has lost weight as well which has made her feel much better. Patient has been trying to start other activity but she still has some weakness she states with most of her muscles. Patient states her upper to recently given her some trouble as well. Overall the patient is able to do all activities of daily living without any significant discomfort.  Past medical, surgical, family and social history reviewed. Medications reviewed all in the electronic medical record.   Review of Systems: No headache, visual changes, nausea, vomiting, diarrhea, constipation, dizziness, abdominal pain, skin rash, fevers, chills, night sweats, weight loss, swollen lymph nodes, body aches, joint swelling, muscle aches, chest pain, shortness of breath, mood changes.   Objective:    Blood pressure 138/76, pulse 67, temperature 98.5 F (36.9 C), temperature source Oral, resp. rate 16, weight 185 lb 0.6 oz (83.934 kg), SpO2 97.00%.   General: No apparent distress alert and oriented x3 mood and affect normal, dressed appropriately.  HEENT: Pupils equal, extraocular movements intact Respiratory: Patient's speak in full sentences and does not appear short of breath Cardiovascular: No lower extremity edema, non tender, no erythema Skin: Warm dry intact with no signs of infection or rash on extremities or on axial skeleton. Abdomen: Soft nontender Neuro: Cranial nerves II through XII are intact, neurovascularly intact in all extremities with 2+ DTRs and 2+ pulses. Lymph: No lymphadenopathy of posterior or anterior cervical chain or axillae bilaterally.  Gait normal  with good balance and coordination.  MSK: Non tender with full range of motion and good stability and symmetric strength and tone of shoulders, elbows, wrist, and ankles bilaterally.  Knee: Right Normal to inspection with no erythema or effusion or obvious bony abnormalities. Patient though does have osteoarthritic changes of the knees bilaterally Palpation normal with no warmth, but does have joint line tenderness, but no patellar tenderness, or condyle tenderness. ROM full in flexion and extension and lower leg rotation. Ligaments with solid consistent endpoints including ACL, PCL, LCL, MCL. Negative Mcmurray's, Apley's, and Thessalonian tests. Painful patellar compression. Patellar glide with marked crepitus. Patellar and quadriceps tendons unremarkable. Hamstring and quadriceps strength is normal.  Contralateral knee is fairly similar with less joint line tenderness there is no mass that is appreciated on the medial aspect. Patient though does have full range of motion is full strength of the shoulder girdles bilaterally neurovascularly intact distally.   Impression and Recommendations:     This case required medical decision making of moderate complexity.

## 2013-10-29 NOTE — Assessment & Plan Note (Signed)
I believe patient's fatigue is secondary to her diet. Patient is trying to lose weight and does not eat any minimal products. Discussed soy protein supplementation as well as iron supplementation second help.  Discussed timing of medications as well as food that can be beneficial to Patient will come back again in 4 weeks for further evaluation and continuing to have fatigue we may consider further workup including labs.

## 2014-08-19 ENCOUNTER — Telehealth (HOSPITAL_COMMUNITY): Payer: Self-pay | Admitting: *Deleted

## 2014-08-19 NOTE — Telephone Encounter (Signed)
error 

## 2014-08-19 NOTE — Telephone Encounter (Signed)
Spoke to pt about need for AWV to include colon Ca screening that is due at this point. She stated refusal for colonoscopy procedure. Pt will call back to schedule AWV.

## 2014-08-21 ENCOUNTER — Encounter: Payer: Self-pay | Admitting: Internal Medicine

## 2014-08-21 ENCOUNTER — Other Ambulatory Visit: Payer: Self-pay | Admitting: Internal Medicine

## 2014-08-21 ENCOUNTER — Ambulatory Visit (INDEPENDENT_AMBULATORY_CARE_PROVIDER_SITE_OTHER)
Admission: RE | Admit: 2014-08-21 | Discharge: 2014-08-21 | Disposition: A | Discharge status: Home / Self Care | Payer: Medicare Other | Source: Ambulatory Visit | Attending: Internal Medicine | Admitting: Internal Medicine

## 2014-08-21 ENCOUNTER — Other Ambulatory Visit (INDEPENDENT_AMBULATORY_CARE_PROVIDER_SITE_OTHER): Payer: Medicare Other

## 2014-08-21 ENCOUNTER — Ambulatory Visit (INDEPENDENT_AMBULATORY_CARE_PROVIDER_SITE_OTHER): Payer: Medicare Other | Admitting: Internal Medicine

## 2014-08-21 VITALS — BP 120/82 | HR 73 | Temp 98.1°F | Wt 185.0 lb

## 2014-08-21 DIAGNOSIS — Z853 Personal history of malignant neoplasm of breast: Secondary | ICD-10-CM

## 2014-08-21 DIAGNOSIS — E785 Hyperlipidemia, unspecified: Secondary | ICD-10-CM

## 2014-08-21 DIAGNOSIS — E538 Deficiency of other specified B group vitamins: Secondary | ICD-10-CM

## 2014-08-21 DIAGNOSIS — E559 Vitamin D deficiency, unspecified: Secondary | ICD-10-CM

## 2014-08-21 DIAGNOSIS — Z0000 Encounter for general adult medical examination without abnormal findings: Secondary | ICD-10-CM

## 2014-08-21 DIAGNOSIS — D509 Iron deficiency anemia, unspecified: Secondary | ICD-10-CM

## 2014-08-21 LAB — LIPID PANEL
CHOL/HDL RATIO: 3
Cholesterol: 189 mg/dL (ref 0–200)
HDL: 56.6 mg/dL (ref >39.00)
LDL Cholesterol: 119 mg/dL — ABNORMAL HIGH (ref 0–99)
NONHDL: 132.4
TRIGLYCERIDES: 68 mg/dL (ref 0.0–149.0)
VLDL: 13.6 mg/dL (ref 0.0–40.0)

## 2014-08-21 LAB — CBC WITH DIFFERENTIAL/PLATELET
BASOS PCT: 0.2 % (ref 0.0–3.0)
Basophils Absolute: 0 10*3/uL (ref 0.0–0.1)
EOS PCT: 1 % (ref 0.0–5.0)
Eosinophils Absolute: 0.1 10*3/uL (ref 0.0–0.7)
HCT: 35.1 % — ABNORMAL LOW (ref 36.0–46.0)
HEMOGLOBIN: 11.4 g/dL — AB (ref 12.0–15.0)
LYMPHS ABS: 1.9 10*3/uL (ref 0.7–4.0)
Lymphocytes Relative: 31.1 % (ref 12.0–46.0)
MCHC: 32.4 g/dL (ref 30.0–36.0)
MCV: 88 fl (ref 78.0–100.0)
MONO ABS: 0.4 10*3/uL (ref 0.1–1.0)
Monocytes Relative: 6.3 % (ref 3.0–12.0)
NEUTROS ABS: 3.7 10*3/uL (ref 1.4–7.7)
Neutrophils Relative %: 61.4 % (ref 43.0–77.0)
PLATELETS: 230 10*3/uL (ref 150.0–400.0)
RBC: 3.99 Mil/uL (ref 3.87–5.11)
RDW: 12.2 % (ref 11.5–15.5)
WBC: 6 10*3/uL (ref 4.0–10.5)

## 2014-08-21 LAB — VITAMIN D 25 HYDROXY (VIT D DEFICIENCY, FRACTURES): VITD: 7.29 ng/mL — ABNORMAL LOW (ref 30.00–100.00)

## 2014-08-21 LAB — BASIC METABOLIC PANEL
BUN: 10 mg/dL (ref 6–23)
CHLORIDE: 104 meq/L (ref 96–112)
CO2: 26 mEq/L (ref 19–32)
Calcium: 9.4 mg/dL (ref 8.4–10.5)
Creatinine, Ser: 0.6 mg/dL (ref 0.4–1.2)
GFR: 123.17 mL/min (ref >60.00)
GLUCOSE: 83 mg/dL (ref 70–99)
Potassium: 3.8 mEq/L (ref 3.5–5.1)
SODIUM: 138 meq/L (ref 135–145)

## 2014-08-21 LAB — URINALYSIS, ROUTINE W REFLEX MICROSCOPIC
Bilirubin Urine: NEGATIVE
KETONES UR: NEGATIVE
Nitrite: NEGATIVE
PH: 5.5 (ref 5.0–8.0)
SPECIFIC GRAVITY, URINE: 1.01 (ref 1.000–1.030)
TOTAL PROTEIN, URINE-UPE24: NEGATIVE
URINE GLUCOSE: NEGATIVE
Urobilinogen, UA: 0.2 (ref 0.0–1.0)

## 2014-08-21 LAB — HEPATIC FUNCTION PANEL
ALT: 14 U/L (ref 0–35)
AST: 32 U/L (ref 0–37)
Albumin: 3.4 g/dL — ABNORMAL LOW (ref 3.5–5.2)
Alkaline Phosphatase: 65 U/L (ref 39–117)
BILIRUBIN DIRECT: 0.1 mg/dL (ref 0.0–0.3)
BILIRUBIN TOTAL: 0.5 mg/dL (ref 0.2–1.2)
Total Protein: 7.1 g/dL (ref 6.0–8.3)

## 2014-08-21 LAB — IBC PANEL
Iron: 74 ug/dL (ref 42–145)
Saturation Ratios: 29.2 % (ref 20.0–50.0)
TRANSFERRIN: 180.8 mg/dL — AB (ref 212.0–360.0)

## 2014-08-21 LAB — TSH: TSH: 0.48 u[IU]/mL (ref 0.35–4.50)

## 2014-08-21 LAB — VITAMIN B12: VITAMIN B 12: 352 pg/mL (ref 211–911)

## 2014-08-21 MED ORDER — ERGOCALCIFEROL 1.25 MG (50000 UT) PO CAPS: 50000.0000 [IU] | ORAL_CAPSULE | ORAL | Status: DC

## 2014-08-21 NOTE — Assessment & Plan Note (Signed)
Also for vit b12 re-check

## 2014-08-21 NOTE — Assessment & Plan Note (Signed)
Also for iron check

## 2014-08-21 NOTE — Progress Notes (Signed)
Subjective:    Patient ID: Margaret Rubio, female    DOB: 11/18/1940, 73 y.o.   MRN: [id number]  HPI  Here for wellness and f/u;  Overall doing ok;  Pt denies CP, worsening SOB, DOE, wheezing, orthopnea, PND, worsening LE edema, palpitations, dizziness or syncope.  Pt denies neurological change such as new headache, facial or extremity weakness.  Pt denies polydipsia, polyuria, or low sugar symptoms. Pt states overall good compliance with treatment and medications, good tolerability, and has been trying to follow lower cholesterol diet.  Pt denies worsening depressive symptoms, suicidal ideation or panic. No fever, night sweats, wt loss, loss of appetite, or other constitutional symptoms.  Pt states good ability with ADL's, has low fall risk, home safety reviewed and adequate, no other significant changes in hearing or vision, and only occasionally active with exercise.  Has also increased size and density of right breast recently, now with some ? Ulceration near nipple.  Has hx of locally advanced breast ca since 2009, has deferred tx, trying diet and wt loss. Past Medical History  Diagnosis Date  . 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 ASTHMA 01/31/2008  . BREAST CANCER, HX OF 11/14/2009  . BRONCHITIS, ACUTE 01/31/2008  . Cough 10/17/2009  . FATIGUE 11/14/2009  . GANGLION CYST 05/01/2009  . HYPERLIPIDEMIA 05/21/2007  . HYPERTENSION 05/05/2007  . LOW BACK PAIN 05/21/2007  . MENOPAUSAL DISORDER 05/22/2010  . Pain in joint, lower leg 05/10/2008  . RASH-NONVESICULAR 05/01/2009  . SHOULDER PAIN, LEFT 11/14/2009  . 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 VITAMIN B12 DEFICIENCY 05/23/2010  . rt breast ca dx'd 2009    right breast   Past Surgical History  Procedure Laterality Date  . Removal of neck lump      benigh 1986  . Myomectomy      reports that she has never smoked. She does not have any smokeless tobacco history on file. She reports that she does not drink alcohol or use illicit drugs. family history  includes Asthma in her other; Diabetes in her other; Heart disease in her father; Hypertension in her father and other. No Known Allergies Current Outpatient Prescriptions on File Prior to Visit  Medication Sig Dispense Refill  . Ascorbic Acid (VITAMIN C) 100 MG CHEW Chew by mouth every morning.    Marland Kitchen aspirin EC 81 MG tablet Take 1 tablet (81 mg total) by mouth daily. 90 tablet 11  . ergocalciferol (VITAMIN D2) 50000 UNITS capsule Take 1 capsule (50,000 Units total) by mouth once a week. 4 capsule 12   No current facility-administered medications on file prior to visit.   Review of Systems Constitutional: Negative for increased diaphoresis, other activity, appetite or other siginficant weight change  HENT: Negative for worsening hearing loss, ear pain, facial swelling, mouth sores and neck stiffness.   Eyes: Negative for other worsening pain, redness or visual disturbance.  Respiratory: Negative for shortness of breath and wheezing.   Cardiovascular: Negative for chest pain and palpitations.  Gastrointestinal: Negative for diarrhea, blood in stool, abdominal distention or other pain Genitourinary: Negative for hematuria, flank pain or change in urine volume.  Musculoskeletal: Negative for myalgias or other joint complaints.  Skin: Negative for color change and wound.  Neurological: Negative for syncope and numbness. other than noted Hematological: Negative for adenopathy. or other swelling Psychiatric/Behavioral: Negative for hallucinations, self-injury, decreased concentration or other worsening agitation.      Objective:   Physical Exam BP 120/82 mmHg  Pulse 73  Temp(Src) 98.1 F (  36.7 C) (Oral)  Wt 185 lb (83.915 kg)  SpO2 98% VS noted,  Constitutional: Pt is oriented to person, place, and time. Appears well-developed and well-nourished.  Head: Normocephalic and atraumatic.  Right Ear: External ear normal.  Left Ear: External ear normal.  Nose: Nose normal.  Mouth/Throat:  Oropharynx is clear and moist.  Breasts: right twice size and density as left diffusely with diffuse skin induration, and ulcerations small x 3 near nipple without d/c, no definitive mass I could appreciate; left breast NT, normal size, no skin change, no nipple d/c, no mass Eyes: Conjunctivae and EOM are normal. Pupils are equal, round, and reactive to light.  Neck: Normal range of motion. Neck supple. No JVD present. No tracheal deviation present.  Cardiovascular: Normal rate, regular rhythm, normal heart sounds and intact distal pulses.   Pulmonary/Chest: Effort normal and breath sounds without rales or wheezing  Abdominal: Soft. Bowel sounds are normal. NT. No HSM  Musculoskeletal: Normal range of motion. Exhibits no edema.  Lymphadenopathy:  Has no cervical adenopathy.  Neurological: Pt is alert and oriented to person, place, and time. Pt has normal reflexes. No cranial nerve deficit. Motor grossly intact Skin: Skin is warm and dry. No rash noted.  Psychiatric:  Has normal mood and affect. Behavior is normal.      Assessment & Plan:

## 2014-08-21 NOTE — Assessment & Plan Note (Signed)
Also for vit d re-check

## 2014-08-21 NOTE — Assessment & Plan Note (Signed)

## 2014-08-21 NOTE — Assessment & Plan Note (Signed)
Worse in last few months, pt now wishes referral to Dr Galvin Proffer for re-eva;l, also for cxr, also for medical oncology referral - pt is ok with this

## 2014-08-21 NOTE — Patient Instructions (Addendum)
Please continue all other medications as before, and refills have been done if requested.  Please have the pharmacy call with any other refills you may need.  Please continue your efforts at being more active, low cholesterol diet, and weight control.  You are otherwise up to date with prevention measures today.  Please keep your appointments with your specialists as you may have planned  You will be contacted regarding the referral for: Dr Newman/surgury, as well as Oncology (the medical cancer doctor)  Please go to the XRAY Department in the Basement (go straight as you get off the elevator) for the x-ray testing  Please go to the LAB in the Basement (turn left off the elevator) for the tests to be done today  You will be contacted by phone if any changes need to be made immediately.  Otherwise, you will receive a letter about your results with an explanation, but please check with MyChart first.  Please return in 6 months, or sooner if needed

## 2014-08-21 NOTE — Progress Notes (Signed)
Pre visit review using our clinic review tool, if applicable. No additional management support is needed unless otherwise documented below in the visit note. 

## 2014-09-23 ENCOUNTER — Telehealth: Payer: Self-pay | Admitting: *Deleted

## 2014-09-23 NOTE — Telephone Encounter (Signed)
[place] [facility] - Client TELEPHONE ADVICE RECORD [facility] Patient Name: Margaret Rubio Gender: Female DOB: 1941/09/23 Age: 73 Y 67 M 28 D Return Phone Number: [phone] (Primary) Address: City/State/Zip: [place] [employer] Primary Care [place] Night - Client Client Site [place] - Night Physician [place], [place] Type Call [place] Name [place] Phone Number [phone] Relationship To Patient Self Is this call to report lab results? No Call Type General Information Initial Comment Caller states she needs the office to call her, she wants to make an appointment, declined nurse triage. General Information Type Message Only Nurse Assessment Guidelines Guideline Title Affirmed Question Affirmed Notes Nurse Date/Time (Eastern Time) Disp. Time Eilene Ghazi Time) Disposition Final User 09/23/2014 7:09:44 AM General Information Provided Yes Baruch Goldmann After Care Instructions Given Call Event Type User Date / Time Description

## 2014-09-24 ENCOUNTER — Ambulatory Visit (INDEPENDENT_AMBULATORY_CARE_PROVIDER_SITE_OTHER): Payer: Medicare Other | Admitting: Internal Medicine

## 2014-09-24 ENCOUNTER — Encounter: Payer: Self-pay | Admitting: Internal Medicine

## 2014-09-24 VITALS — BP 134/78 | HR 74 | Temp 97.9°F | Ht 65.0 in | Wt 188.5 lb

## 2014-09-24 DIAGNOSIS — M545 Low back pain, unspecified: Secondary | ICD-10-CM

## 2014-09-24 DIAGNOSIS — I10 Essential (primary) hypertension: Secondary | ICD-10-CM

## 2014-09-24 DIAGNOSIS — M79605 Pain in left leg: Principal | ICD-10-CM

## 2014-09-24 MED ORDER — KETOROLAC TROMETHAMINE 30 MG/ML IJ SOLN
30.0000 mg | Freq: Once | INTRAMUSCULAR | Status: AC
  Administered 2014-09-24: 30 mg via INTRAMUSCULAR

## 2014-09-24 MED ORDER — TRAMADOL HCL 50 MG PO TABS: 50.0000 mg | ORAL_TABLET | Freq: Four times a day (QID) | ORAL | Status: DC | PRN

## 2014-09-24 MED ORDER — TIZANIDINE HCL 4 MG PO TABS: 4.0000 mg | ORAL_TABLET | Freq: Four times a day (QID) | ORAL | Status: DC | PRN

## 2014-09-24 MED ORDER — PREDNISONE 10 MG PO TABS: ORAL_TABLET | ORAL | Status: DC

## 2014-09-24 NOTE — Assessment & Plan Note (Signed)
Mod to severe, no neuro change and pain seems radiating but not neuritic, has element left lumbar paravertebral spasm tender noted, for toradoil IM, predpack, tramadol pain control and mucle relaxer prn, f/u anyworsening neuro symptoms

## 2014-09-24 NOTE — Patient Instructions (Signed)
You had the toradol shot for pain today  Please take all new medication as prescribed - the prednisone, tramadol for pain , and tizanidine for muscle relaxer as needed  Please call for any worsening pain, numbness or weakness of the left leg  Please continue all other medications as before, and refills have been done if requested.  Please have the pharmacy call with any other refills you may need.  Please keep your appointments with your specialists as you may have planned

## 2014-09-24 NOTE — Progress Notes (Signed)
Pre visit review using our clinic review tool, if applicable. No additional management support is needed unless otherwise documented below in the visit note. 

## 2014-09-24 NOTE — Progress Notes (Signed)
Subjective:    Patient ID: Margaret Rubio, female    DOB: 1940/12/06, 73 y.o.   MRN: [id number]  HPI  Since Dec 11, Friday, sudden onset severe sharp pain to left lower back with radiation to just above the knee, started after cleaning up about the house, worse now to standing up or bending.  Pt continues to have recurring LBP, no bowel or bladder change, fever, wt loss,  worsening LE numbness/weakness, gait change or falls. Can only walk room to room holding furniture, Pt denies chest pain, increased sob or doe, wheezing, orthopnea, PND, increased LE swelling, palpitations, dizziness or syncope.  Pt denies other new neurological symptoms such as new headache, or facial or extremity weakness or numbness  Denies urinary symptoms such as dysuria, frequency, urgency, flank pain, hematuria or n/v, fever, chills. Past Medical History  Diagnosis Date  . 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 ASTHMA 01/31/2008  . BREAST CANCER, HX OF 11/14/2009  . BRONCHITIS, ACUTE 01/31/2008  . Cough 10/17/2009  . FATIGUE 11/14/2009  . GANGLION CYST 05/01/2009  . HYPERLIPIDEMIA 05/21/2007  . HYPERTENSION 05/05/2007  . LOW BACK PAIN 05/21/2007  . MENOPAUSAL DISORDER 05/22/2010  . Pain in joint, lower leg 05/10/2008  . RASH-NONVESICULAR 05/01/2009  . SHOULDER PAIN, LEFT 11/14/2009  . 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 VITAMIN B12 DEFICIENCY 05/23/2010  . rt breast ca dx'd 2009    right breast   Past Surgical History  Procedure Laterality Date  . Removal of neck lump      benigh 1986  . Myomectomy      reports that she has never smoked. She does not have any smokeless tobacco history on file. She reports that she does not drink alcohol or use illicit drugs. family history includes Asthma in her other; Diabetes in her other; Heart disease in her father; Hypertension in her father and other. No Known Allergies Current Outpatient Prescriptions on File Prior to Visit  Medication Sig Dispense Refill  . Ascorbic Acid (VITAMIN C) 100  MG CHEW Chew by mouth every morning.    Marland Kitchen aspirin EC 81 MG tablet Take 1 tablet (81 mg total) by mouth daily. 90 tablet 11  . ergocalciferol (VITAMIN D2) 50000 UNITS capsule Take 1 capsule (50,000 Units total) by mouth once a week. 4 capsule 3   No current facility-administered medications on file prior to visit.    Review of Systems  Constitutional: Negative for unusual diaphoresis or other sweats  HENT: Negative for ringing in ear Eyes: Negative for double vision or worsening visual disturbance.  Respiratory: Negative for choking and stridor.   Gastrointestinal: Negative for vomiting or other signifcant bowel change Genitourinary: Negative for hematuria or decreased urine volume.  Musculoskeletal: Negative for other MSK pain or swelling Skin: Negative for color change and worsening wound.  Neurological: Negative for tremors and numbness other than noted  Psychiatric/Behavioral: Negative for decreased concentration or agitation other than above       Objective:   Physical Exam BP 134/78 mmHg  Pulse 74  Temp(Src) 97.9 F (36.6 C) (Oral)  Ht 5\' 5"  (1.651 m)  Wt 188 lb 8 oz (85.503 kg)  BMI 31.37 kg/m2  SpO2 96% VS noted,  Constitutional: Pt appears well-developed, well-nourished.  HENT: Head: NCAT.  Right Ear: External ear normal.  Left Ear: External ear normal.  Eyes: . Pupils are equal, round, and reactive to light. Conjunctivae and EOM are normal Neck: Normal range of motion. Neck supple.  Cardiovascular: Normal rate and regular  rhythm.   Pulmonary/Chest: Effort normal and breath sounds normal.  Abd:  Soft, NT, ND, + BS Spine nontender Left lumbar paravertebral tender spasm noted Neurological: Pt is alert. Not confused , motor grossly intact Skin: Skin is warm. No rash Psychiatric: Pt behavior is normal. No agitation.     Assessment & Plan:

## 2014-09-29 NOTE — Assessment & Plan Note (Signed)
stable overall by history and exam, recent data reviewed with pt, and pt to continue medical treatment as before,  to f/u any worsening symptoms or concerns BP Readings from Last 3 Encounters:  09/24/14 134/78  08/21/14 120/82  10/29/13 138/76

## 2014-10-08 ENCOUNTER — Encounter: Payer: Self-pay | Admitting: Internal Medicine

## 2014-10-08 ENCOUNTER — Ambulatory Visit
Admission: RE | Admit: 2014-10-08 | Discharge: 2014-10-08 | Disposition: A | Discharge status: Home / Self Care | Payer: Medicare Other | Source: Ambulatory Visit | Attending: Internal Medicine | Admitting: Internal Medicine

## 2014-10-08 ENCOUNTER — Ambulatory Visit (INDEPENDENT_AMBULATORY_CARE_PROVIDER_SITE_OTHER): Payer: Medicare Other | Admitting: Internal Medicine

## 2014-10-08 ENCOUNTER — Telehealth: Payer: Self-pay | Admitting: Internal Medicine

## 2014-10-08 VITALS — BP 148/82 | HR 66 | Temp 97.9°F | Ht 65.0 in | Wt 184.0 lb

## 2014-10-08 DIAGNOSIS — M5416 Radiculopathy, lumbar region: Secondary | ICD-10-CM

## 2014-10-08 DIAGNOSIS — I10 Essential (primary) hypertension: Secondary | ICD-10-CM

## 2014-10-08 DIAGNOSIS — C50919 Malignant neoplasm of unspecified site of unspecified female breast: Secondary | ICD-10-CM

## 2014-10-08 MED ORDER — HYDROCODONE-ACETAMINOPHEN 5-325 MG PO TABS: 1.0000 | ORAL_TABLET | Freq: Four times a day (QID) | ORAL | Status: DC | PRN

## 2014-10-08 MED ORDER — PREDNISONE 10 MG PO TABS: ORAL_TABLET | ORAL | Status: DC

## 2014-10-08 MED ORDER — MORPHINE SULFATE ER 15 MG PO TBCR: 15.0000 mg | EXTENDED_RELEASE_TABLET | Freq: Two times a day (BID) | ORAL | Status: DC

## 2014-10-08 NOTE — Patient Instructions (Signed)
Please take all new medication as prescribed - the pain medication, and prednisone  Please continue all other medications as before, and refills have been done if requested.  Please have the pharmacy call with any other refills you may need.  Please keep your appointments with your specialists as you may have planned  You will be contacted regarding the referral for: MRI, and Orthopedic referral

## 2014-10-08 NOTE — Assessment & Plan Note (Signed)
With new worsening neuro changes c/w ; for increased pain control, repeat prednisone, MRI LS spine, and refer ortho,  to f/u any worsening symptoms or concerns

## 2014-10-08 NOTE — Telephone Encounter (Signed)
Spoke to pt at home by phone,  MRI high suspicion for met breast ca recurrence  For oncology referral, as well as MS Contin low dose 15 bid - pt to come to office to pick up, hold hydrocodone for breakthrough pain only

## 2014-10-08 NOTE — Assessment & Plan Note (Signed)
stable overall by history and exam, recent data reviewed with pt, and pt to continue medical treatment as before,  to f/u any worsening symptoms or concerns BP Readings from Last 3 Encounters:  10/08/14 148/82  09/24/14 134/78  08/21/14 120/82   Mild elev today likely reactive,  to f/u any worsening symptoms or concerns

## 2014-10-08 NOTE — Progress Notes (Signed)
Subjective:    Patient ID: Margaret Rubio, female    DOB: 25-Sep-1941, 73 y.o.   MRN: [id number]  HPI  Here unfortunately with left LBP persistent, similar in character as last visit, but more severe now 8-10/10.  Dec 11, Friday, sudden onset severe sharp pain to left lower back with radiation to just above the knee, started after cleaning up about the house, worse now to standing up or bending. Pt continues to have recurring LBP, no bowel or bladder change, fever, wt loss, worsening LE numbness/weakness, gait change or falls, except now radiating to the post left thigh to below the knee.. Can only walk room to room holding furniture. No recent MRI, no prior ortho. Usually no significant ortho issues, except for remote left knee arthroscopy. Past Medical History  Diagnosis Date  . 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 ASTHMA 01/31/2008  . BREAST CANCER, HX OF 11/14/2009  . BRONCHITIS, ACUTE 01/31/2008  . Cough 10/17/2009  . FATIGUE 11/14/2009  . GANGLION CYST 05/01/2009  . HYPERLIPIDEMIA 05/21/2007  . HYPERTENSION 05/05/2007  . LOW BACK PAIN 05/21/2007  . MENOPAUSAL DISORDER 05/22/2010  . Pain in joint, lower leg 05/10/2008  . RASH-NONVESICULAR 05/01/2009  . SHOULDER PAIN, LEFT 11/14/2009  . 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 VITAMIN B12 DEFICIENCY 05/23/2010  . rt breast ca dx'd 2009    right breast   Past Surgical History  Procedure Laterality Date  . Removal of neck lump      benigh 1986  . Myomectomy      reports that she has never smoked. She does not have any smokeless tobacco history on file. She reports that she does not drink alcohol or use illicit drugs. family history includes Asthma in her other; Diabetes in her other; Heart disease in her father; Hypertension in her father and other. No Known Allergies Current Outpatient Prescriptions on File Prior to Visit  Medication Sig Dispense Refill  . Ascorbic Acid (VITAMIN C) 100 MG CHEW Chew by mouth every morning.    Marland Kitchen aspirin EC 81 MG tablet  Take 1 tablet (81 mg total) by mouth daily. 90 tablet 11  . ergocalciferol (VITAMIN D2) 50000 UNITS capsule Take 1 capsule (50,000 Units total) by mouth once a week. 4 capsule 3  . tiZANidine (ZANAFLEX) 4 MG tablet Take 1 tablet (4 mg total) by mouth every 6 (six) hours as needed for muscle spasms. 40 tablet 1  . traMADol (ULTRAM) 50 MG tablet Take 1 tablet (50 mg total) by mouth every 6 (six) hours as needed. 60 tablet 1   No current facility-administered medications on file prior to visit.   Review of Systems  Constitutional: Negative for unusual diaphoresis or other sweats  HENT: Negative for ringing in ear Eyes: Negative for double vision or worsening visual disturbance.  Respiratory: Negative for choking and stridor.   Gastrointestinal: Negative for vomiting or other signifcant bowel change Genitourinary: Negative for hematuria or decreased urine volume.  Musculoskeletal: Negative for other MSK pain or swelling Skin: Negative for color change and worsening wound.  Neurological: Negative for tremors and numbness other than noted  Psychiatric/Behavioral: Negative for decreased concentration or agitation other than above       Objective:   Physical Exam BP 148/82 mmHg  Pulse 66  Temp(Src) 97.9 F (36.6 C) (Oral)  Ht 5\' 5"  (1.651 m)  Wt 184 lb (83.462 kg)  BMI 30.62 kg/m2  SpO2 97% VS noted,  Constitutional: Pt appears well-developed, well-nourished.  HENT: Head: NCAT.  Right Ear: External ear normal.  Left Ear: External ear normal.  Eyes: . Pupils are equal, round, and reactive to light. Conjunctivae and EOM are normal Neck: Normal range of motion. Neck supple.  Cardiovascular: Normal rate and regular rhythm.   Pulmonary/Chest: Effort normal and breath sounds without rales or wheezing.  Abd:  Soft, NT, ND, + BS, no flank tender Neurological: Pt is alert. Not confused , motor with 4+/5 LLE weakness, dtr/sens intact, gait markedly slowed ,mod risk fall, walks with cane Skin:  Skin is warm. No rash Psychiatric: Pt behavior is normal. No agitation.     Assessment & Plan:

## 2014-10-08 NOTE — Progress Notes (Signed)
Pre visit review using our clinic review tool, if applicable. No additional management support is needed unless otherwise documented below in the visit note. 

## 2014-10-14 ENCOUNTER — Telehealth: Payer: Self-pay | Admitting: *Deleted

## 2014-10-14 NOTE — Telephone Encounter (Signed)
Received referral from Dr. Gwynn Burly offices.  Noticed pt is a current pt of Dr. Dicie Beam.  Called and left a message for the pt to return my call so I can see if she wants to see Dr. Marin Olp again or come back her to [place].

## 2014-10-15 ENCOUNTER — Telehealth: Payer: Self-pay | Admitting: *Deleted

## 2014-10-15 NOTE — Telephone Encounter (Signed)
Pt returned my call and requested a female med onc.  Does not want to see Dr. Marin Olp since [facility] is too far for her to drive.  Confirmed 10/30/14 appt w/ her.  Emailed Dr. Jenny Reichmann to make him aware.

## 2014-10-30 ENCOUNTER — Ambulatory Visit (HOSPITAL_BASED_OUTPATIENT_CLINIC_OR_DEPARTMENT_OTHER): Payer: Medicare Other

## 2014-10-30 ENCOUNTER — Encounter: Payer: Self-pay | Admitting: Hematology

## 2014-10-30 ENCOUNTER — Telehealth: Payer: Self-pay | Admitting: Hematology

## 2014-10-30 ENCOUNTER — Ambulatory Visit (HOSPITAL_BASED_OUTPATIENT_CLINIC_OR_DEPARTMENT_OTHER): Payer: Medicare Other | Admitting: Hematology

## 2014-10-30 VITALS — BP 128/57 | HR 74 | Temp 98.1°F | Resp 18 | Ht 65.0 in | Wt 180.7 lb

## 2014-10-30 DIAGNOSIS — M25559 Pain in unspecified hip: Secondary | ICD-10-CM

## 2014-10-30 DIAGNOSIS — C50911 Malignant neoplasm of unspecified site of right female breast: Secondary | ICD-10-CM

## 2014-10-30 DIAGNOSIS — M545 Low back pain: Secondary | ICD-10-CM | POA: Diagnosis not present

## 2014-10-30 DIAGNOSIS — C50919 Malignant neoplasm of unspecified site of unspecified female breast: Secondary | ICD-10-CM | POA: Diagnosis not present

## 2014-10-30 LAB — COMPREHENSIVE METABOLIC PANEL (CC13)
ALBUMIN: 3.6 g/dL (ref 3.5–5.0)
ALK PHOS: 77 U/L (ref 40–150)
ALT: 9 U/L (ref 0–55)
AST: 28 U/L (ref 5–34)
Anion Gap: 8 mEq/L (ref 3–11)
BUN: 8.8 mg/dL (ref 7.0–26.0)
CALCIUM: 8.9 mg/dL (ref 8.4–10.4)
CHLORIDE: 104 meq/L (ref 98–109)
CO2: 26 mEq/L (ref 22–29)
Creatinine: 0.7 mg/dL (ref 0.6–1.1)
GLUCOSE: 100 mg/dL (ref 70–140)
Potassium: 4.2 mEq/L (ref 3.5–5.1)
Sodium: 138 mEq/L (ref 136–145)
TOTAL PROTEIN: 6.7 g/dL (ref 6.4–8.3)
Total Bilirubin: 0.54 mg/dL (ref 0.20–1.20)

## 2014-10-30 LAB — CBC WITH DIFFERENTIAL/PLATELET
BASO%: 0.5 % (ref 0.0–2.0)
BASOS ABS: 0 10*3/uL (ref 0.0–0.1)
EOS ABS: 0 10*3/uL (ref 0.0–0.5)
EOS%: 0.8 % (ref 0.0–7.0)
HCT: 31.9 % — ABNORMAL LOW (ref 34.8–46.6)
HEMOGLOBIN: 10 g/dL — AB (ref 11.6–15.9)
LYMPH#: 1.4 10*3/uL (ref 0.9–3.3)
LYMPH%: 28.4 % (ref 14.0–49.7)
MCH: 28 pg (ref 25.1–34.0)
MCHC: 31.3 g/dL — ABNORMAL LOW (ref 31.5–36.0)
MCV: 89.3 fL (ref 79.5–101.0)
MONO#: 0.3 10*3/uL (ref 0.1–0.9)
MONO%: 6.7 % (ref 0.0–14.0)
NEUT#: 3.3 10*3/uL (ref 1.5–6.5)
NEUT%: 63.6 % (ref 38.4–76.8)
Platelets: 260 10*3/uL (ref 145–400)
RBC: 3.58 10*6/uL — AB (ref 3.70–5.45)
RDW: 13.2 % (ref 11.2–14.5)
WBC: 5.1 10*3/uL (ref 3.9–10.3)

## 2014-10-30 MED ORDER — HYDROCODONE-ACETAMINOPHEN 5-325 MG PO TABS: 1.0000 | ORAL_TABLET | Freq: Four times a day (QID) | ORAL | Status: DC | PRN

## 2014-10-30 NOTE — Progress Notes (Signed)
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 Pt declined surgery or other treatment.     05/20/2011 Tumor Marker right breast mass biopsy showed IDA, ER+/PR+/HER2- (ratio 1.37)   09/04/2011 Cancer Staging PET scan showed no distant mets   10/08/2014 Progression  lumbar spine MRI showed multiple bone metastasis , highly suspicious for  metastatic disease.     HISTORY OF PRESENTING ILLNESS:  Margaret Rubio 74 y.o. female  Was referred by her primary care physician to discuss the management ofprobable metastatic breast cancer.  She was initially diagnosed with right breast cancer in November 2009. 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 In December of 2009, she underwent a right axillary lymph node biopsy on 09/24/2008 which showed metastatic ductal carcinoma. (Case No: [id number]) The tumor was ER 100% positive, PR 0% negative, Ki67 - 22%. 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 The ratio of HER-2:CEP 17 was 2.07.  She decided to pursue nature therapy by diet and exercise, and did not have surgery and other tranditional therapy for her breast cancer.   In May 2011 she underwent a mammogram that showed a 3.8 cm invasive duct carcinoma in the subareolar region of the right breast which had increased since November 2009.  The tumor was ER  99% positive,  PR 4% positive, HER-2 negative ( ratio 1.37).  She  was seen by Dr. Obie Dredge and Dr. Ezzard Standing.  However, she failed multiple appointments and sought alternative medical care in [place]. This involved putting something on her right breast that caused some scarring.  She noticed the enlarged right breast mass and nipple inversion about one year ago. No pain, but some skin change. She developed left low back/buttock and hip and  pain in Nov 2015, was seen by PCP Dr. Jonny Ruiz who prescribed vicodin and tramadol and pain improved with meds. Due to the persistent pain, she underwent a lumber MRI  On 10/08/2014, which showed multiple bone metastasis.  She run out pain medication about a week ago,  But did not call for refill. She states her pain is not bad , but she appears to be uncomfortable when she stands up and walks.  She otherwise feels well overall,  Has good appetite and energy level. She lives alone and still functions well.  She comes in with her niece today.    MEDICAL HISTORY:  Past Medical History  Diagnosis Date  . 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 ASTHMA 01/31/2008  . BREAST CANCER, HX OF 11/14/2009  . BRONCHITIS, ACUTE 01/31/2008  . Cough 10/17/2009  . FATIGUE 11/14/2009  . GANGLION CYST 05/01/2009  . HYPERLIPIDEMIA 05/21/2007  . HYPERTENSION 05/05/2007  . LOW BACK PAIN 05/21/2007  . MENOPAUSAL DISORDER 05/22/2010  . Pain in joint, lower leg 05/10/2008  . RASH-NONVESICULAR 05/01/2009  . SHOULDER PAIN, LEFT 11/14/2009  . 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 VITAMIN B12 DEFICIENCY 05/23/2010  . rt breast ca dx'd 2009    right  breast    SURGICAL HISTORY: Past Surgical History  Procedure Laterality Date  . Removal of neck lump      benigh 1986  . Myomectomy      SOCIAL HISTORY: History   Social History  . Marital Status: Widowed    Spouse Name: N/A    Number of Children: 69, lives in [place].   . Years of Education: N/A   Occupational History  . Not on file.   Social History Main Topics  . Smoking status: Never Smoker   . Smokeless  tobacco: Not on file  . Alcohol Use: No  . Drug Use: No  . Sexual Activity: Not on file   Other Topics Concern  . Not on file   Social History Narrative    FAMILY HISTORY: Family History  Problem Relation Age of Onset  . Asthma Other   . Diabetes Other   . Hypertension Other   . Hypertension Father   . Heart disease Father   Paternal aunt had cancer, unknown type. No other family history of malignancy   ALLERGIES:  has No Known Allergies.  MEDICATIONS:  Current Outpatient Prescriptions  Medication Sig Dispense Refill  . Ascorbic Acid (VITAMIN C) 100 MG CHEW Chew by mouth every morning.    Marland Kitchen aspirin EC 81 MG tablet Take 1 tablet (81 mg total) by mouth daily. 90 tablet 11  . ergocalciferol (VITAMIN D2) 50000 UNITS capsule Take 1 capsule (50,000 Units total) by mouth once a week. 4 capsule 3  . HYDROcodone-acetaminophen (NORCO/VICODIN) 5-325 MG per tablet Take 1 tablet by mouth every 6 (six) hours as needed for moderate pain. 60 tablet 0  . morphine (MS CONTIN) 15 MG 12 hr tablet Take 1 tablet (15 mg total) by mouth every 12 (twelve) hours. 60 tablet 0  . 0    0  . tiZANidine (ZANAFLEX) 4 MG tablet Take 1 tablet (4 mg total) by mouth every 6 (six) hours as needed for muscle spasms. 40 tablet 1   No current facility-administered medications for this visit.    REVIEW OF SYSTEMS:   Constitutional: Denies fevers, chills or abnormal night sweats Eyes: Denies blurriness of vision, double vision or watery eyes Ears, nose, mouth, throat, and face: Denies mucositis or sore throat Respiratory: Denies cough, dyspnea or wheezes Cardiovascular: Denies palpitation, chest discomfort or lower extremity swelling Gastrointestinal:  Denies nausea, heartburn or change in bowel habits Skin: Denies abnormal skin rashes Lymphatics: Denies new lymphadenopathy or easy bruising Neurological:Denies numbness, tingling or new weaknesses Behavioral/Psych: Mood is stable, no new changes  All other  systems were reviewed with the patient and are negative.  PHYSICAL EXAMINATION: ECOG PERFORMANCE STATUS: 1 - Symptomatic but completely ambulatory  Filed Vitals:   10/30/14 1115  BP: 128/57  Pulse: 74  Temp: 98.1 F (36.7 C)  Resp: 18   Filed Weights   10/30/14 1115  Weight: 180 lb 11.2 oz (81.965 kg)    GENERAL:alert, no distress and comfortable SKIN: skin color, texture, turgor are normal, no rashes or significant lesions EYES: normal, conjunctiva are pink and non-injected, sclera clear OROPHARYNX:no exudate, no erythema and lips, buccal mucosa, and tongue normal  NECK: supple, thyroid normal size, non-tender, without nodularity LYMPH:  no palpable lymphadenopathy in the cervical, axillary or inguinal LUNGS: clear to auscultation and percussion with normal breathing effort HEART: regular rate & rhythm and no murmurs and no lower extremity edema ABDOMEN:abdomen soft, non-tender and normal bowel sounds Musculoskeletal:no cyanosis of digits and no clubbing  PSYCH: alert & oriented x 3 with fluent speech NEURO: no focal motor/sensory deficits Breasts: Breast inspection showed  Significantly enlarged right breast, compared to left breast, (+) skin pigmentations , nipple inversion and surrounding dry ulcer/ scar change , no discharge.  The entire breast is very firm , nontender , no discrete mass. Palpation of the  Left breasts and axilla revealed no obvious mass that I could appreciate.   LABORATORY DATA:  I have reviewed the data as listed Lab Results  Component Value Date   WBC 6.0 08/21/2014   HGB 11.4* 08/21/2014   HCT 35.1* 08/21/2014   MCV 88.0 08/21/2014   PLT 230.0 08/21/2014    Recent Labs  08/21/14 1524  NA 138  K 3.8  CL 104  CO2 26  GLUCOSE 83  BUN 10  CREATININE 0.6  CALCIUM 9.4  PROT 7.1  ALBUMIN 3.4*  AST 32  ALT 14  ALKPHOS 65  BILITOT 0.5  BILIDIR 0.1   PATHOLOGY REPORT  RIGHT BREAST MASS, 6 O'CLOCK, NEEDLE CORE BIOPSIES:  09/03/2008 MICROSCOPIC FOCUS OF DUCTAL CARCINOMA IN SITU ASSOCIATED WITH MICROSCOPIC FOCUS OF MICROINVASION. SEE COMMENT.  LYMPH NODE, RIGHT AXILLARY, NEEDLE CORE BIOPSIES: METASTATIC DUCTAL CARCINOMA.  09/24/2008  COMMENT There are needle core biopsies of lymph node showing partial replacement by metastatic adenocarcinoma consistent with metastasis from a breast primary, metastatic ductal carcinoma. Since the breast prognostic profile is not performed on the previous material because of limited invasive tumor ([id number]), the breast prognostic profile will be performed on the tumor in the lymph node and reported in a separate report. Interpretation HER-2/NEU BY CISH -- SHOWS AMPLIFICATION BY CISH ANALYSIS. THE RATIO OF HER-2: CEP 17 SIGNALS WAS 2.07  Diagnosis 05/20/2011 Breast, right, needle core biopsy - INVASIVE DUCTAL CARCINOMA, SEE COMMENT. - LYMPHOVASCULAR INVASION PRESENT. Estrogen Receptor (Negative, <1%): 99%, STRONG STAINING INTENSITY Progesterone Receptor (Negative, <1%): 4%, STRONG HER-2/NEU BY CISH - NO AMPLIFICATION OF HER-2 DETECTED. THE RATIO OF HER-2: CEP 17 SIGNALS WAS 1.38.  RADIOGRAPHIC STUDIES: I have personally reviewed the radiological images as listed and agreed with the findings in the report.  Mr Lumbar Spine Wo Contrast 10/08/2014   IMPRESSION: 1. New diffuse lumbosacral metastatic disease compatible with metastatic breast cancer. No cauda equina compression or extraosseous extension of tumor. No pathologic compression fracture. 2. 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 In this patient with LEFT hip and gluteal pain, the metastatic disease is the most likely cause however there is also potential for facet mediated referred pain or irritation of the exited LEFT L3 nerve root at L3-L4. 3. These results will be called to the ordering clinician or representative by the Radiologist Assistant, and communication documented in the PACS or  zVision Dashboard.   Electronically Signed   By: Dereck Ligas M.D.   On: 10/08/2014 16:33    ASSESSMENT & PLAN:   Mrs. Lauderbaugh is a 74 year old  African-American female, who was diagnosed with node positive right breast  Invasive adenocarcinoma in  2009, untreated,  Now presents with low back pain and lumbar spine MRI is highly suspicious for bone metastasis.  1.  Right breast IDA,  Strongly ER positive,  PR weakly positive,  HER-2 positive on first biopsy , but negative on second biopsy 3 years later,  Likely metastatic now.  - it has been over 6 years since her initial diagnosis. Her lumbar MRI is very suspicious for bone metastasis.  Her disease course is more consistent with ER positive , HER-2 negative  disease. - I recommend to obtain a PET scan to further evaluate her bone lesions and ruled out other distant metastasis. - based on the PET scan findings, I recommend a biopsy at is a metastatic lesion,  If possible.  If bone biopsy is not feasible, I would consider repeated her breast mass biopsy to  clarify her HER-2 status. - I discussed that her disease at this stage is unlikely curable , I would not offer surgery. The goal of treatment is palliative. - I would likely consider hormonal therapy  As first line treatment if there are no  visceral metastasis.  Chemotherapy can be considered if she has high disease burden. - She voiced good understanding, and is willing to start treatment.  2.  Low back/ hip pain,  Secondary to metastatic cancer - I encouraged her to take pain medication. I given her a prescription of Vicodin  Today.  Plan: - PET scan -  tissuebiopsy based on PET scan findings,  And repeat  ER/PR and HER-2 - return to clinic in 2-3 weeks after the above  Results return.   I spoke with her daughter Severiano Gilbert [phone]),  Who is her power of attorney,  regarding the above.  She  Lives in [place],  And is planning to visit her mother soon.   All questions were  answered. The patient knows to call the clinic with any problems, questions or concerns. I spent 55 minutes counseling the patient face to face. The total time spent in the appointment was 60 minutes and more than 50% was on counseling.     Truitt Merle, MD 10/30/2014 11:21 AM

## 2014-10-30 NOTE — Telephone Encounter (Signed)
Pt confirmed labs/ov per 01/20 POF, gave pt AVS.... KJ

## 2014-10-31 LAB — CANCER ANTIGEN 27.29: CA 27.29: 355 U/mL — ABNORMAL HIGH (ref 0–39)

## 2014-11-13 ENCOUNTER — Ambulatory Visit (HOSPITAL_COMMUNITY): Payer: Medicare Other

## 2014-11-14 ENCOUNTER — Ambulatory Visit: Payer: Medicare Other | Admitting: Hematology

## 2014-11-14 ENCOUNTER — Other Ambulatory Visit: Payer: Medicare Other

## 2014-11-14 ENCOUNTER — Telehealth: Payer: Self-pay | Admitting: *Deleted

## 2014-11-14 NOTE — Telephone Encounter (Signed)
Pt called and left message wanting to know re:  Could pt rescheduled follow up appt with Dr. Burr Medico from 11/20/14 to 11/25/14  Since her daughter will be in town and can come with pt to her appt - to discuss PET scan results.   If pt cannot change her appt, would Dr. Burr Medico discuss scan results with her daughter who lives out of town. Pt's  Phone    [phone].

## 2014-11-15 ENCOUNTER — Telehealth: Payer: Self-pay | Admitting: Hematology

## 2014-11-15 ENCOUNTER — Ambulatory Visit (HOSPITAL_COMMUNITY): Payer: Medicare Other

## 2014-11-15 NOTE — Telephone Encounter (Signed)
Pt came in r/s labs/ov due to PET scan was down r/s pt confirmed updated labs/ov.... KJ

## 2014-11-20 ENCOUNTER — Telehealth: Payer: Self-pay | Admitting: *Deleted

## 2014-11-20 ENCOUNTER — Other Ambulatory Visit: Payer: Medicare Other

## 2014-11-20 ENCOUNTER — Ambulatory Visit: Payer: Medicare Other | Admitting: Hematology

## 2014-11-20 NOTE — Telephone Encounter (Signed)
Patient called reporting she sees she missed a call from [phone] number. Message sent to collaborative nurse to notify of this call.

## 2014-11-20 NOTE — Telephone Encounter (Signed)
Pt returned nurse's call from 11/19/14.  Attempted to call pt back unsuccessfully.  Unable to leave message on voice mail due to voice mail box has not been set up.

## 2014-11-25 ENCOUNTER — Encounter (HOSPITAL_COMMUNITY)
Admission: RE | Admit: 2014-11-25 | Discharge: 2014-11-25 | Disposition: A | Discharge status: Home / Self Care | Payer: Medicare Other | Source: Ambulatory Visit | Attending: Hematology | Admitting: Hematology

## 2014-11-25 ENCOUNTER — Other Ambulatory Visit: Payer: Self-pay | Admitting: Hematology

## 2014-11-25 DIAGNOSIS — C50911 Malignant neoplasm of unspecified site of right female breast: Secondary | ICD-10-CM

## 2014-11-25 DIAGNOSIS — C7981 Secondary malignant neoplasm of breast: Secondary | ICD-10-CM | POA: Diagnosis not present

## 2014-11-25 DIAGNOSIS — C7951 Secondary malignant neoplasm of bone: Secondary | ICD-10-CM | POA: Diagnosis not present

## 2014-11-25 LAB — GLUCOSE, CAPILLARY: GLUCOSE-CAPILLARY: 78 mg/dL (ref 70–99)

## 2014-11-25 MED ORDER — FLUDEOXYGLUCOSE F - 18 (FDG) INJECTION
7.9000 | Freq: Once | INTRAVENOUS | Status: AC | PRN
  Administered 2014-11-25: 7.9 via INTRAVENOUS

## 2014-11-29 ENCOUNTER — Encounter: Payer: Self-pay | Admitting: Hematology

## 2014-11-29 ENCOUNTER — Telehealth: Payer: Self-pay | Admitting: Hematology

## 2014-11-29 ENCOUNTER — Other Ambulatory Visit (HOSPITAL_BASED_OUTPATIENT_CLINIC_OR_DEPARTMENT_OTHER): Payer: Medicare Other

## 2014-11-29 ENCOUNTER — Ambulatory Visit (HOSPITAL_BASED_OUTPATIENT_CLINIC_OR_DEPARTMENT_OTHER): Payer: Medicare Other | Admitting: Hematology

## 2014-11-29 VITALS — BP 146/66 | HR 62 | Temp 98.5°F | Resp 18 | Ht 65.0 in | Wt 181.0 lb

## 2014-11-29 DIAGNOSIS — C7951 Secondary malignant neoplasm of bone: Secondary | ICD-10-CM | POA: Diagnosis not present

## 2014-11-29 DIAGNOSIS — C50911 Malignant neoplasm of unspecified site of right female breast: Secondary | ICD-10-CM

## 2014-11-29 DIAGNOSIS — C50811 Malignant neoplasm of overlapping sites of right female breast: Secondary | ICD-10-CM

## 2014-11-29 DIAGNOSIS — M25559 Pain in unspecified hip: Secondary | ICD-10-CM

## 2014-11-29 DIAGNOSIS — M545 Low back pain: Secondary | ICD-10-CM

## 2014-11-29 LAB — CBC WITH DIFFERENTIAL/PLATELET
BASO%: 0.2 % (ref 0.0–2.0)
Basophils Absolute: 0 10*3/uL (ref 0.0–0.1)
EOS%: 1.2 % (ref 0.0–7.0)
Eosinophils Absolute: 0.1 10*3/uL (ref 0.0–0.5)
HCT: 32.1 % — ABNORMAL LOW (ref 34.8–46.6)
HGB: 10.5 g/dL — ABNORMAL LOW (ref 11.6–15.9)
LYMPH#: 1.4 10*3/uL (ref 0.9–3.3)
LYMPH%: 32.2 % (ref 14.0–49.7)
MCH: 29 pg (ref 25.1–34.0)
MCHC: 32.7 g/dL (ref 31.5–36.0)
MCV: 88.7 fL (ref 79.5–101.0)
MONO#: 0.4 10*3/uL (ref 0.1–0.9)
MONO%: 8.1 % (ref 0.0–14.0)
NEUT%: 58.3 % (ref 38.4–76.8)
NEUTROS ABS: 2.5 10*3/uL (ref 1.5–6.5)
Platelets: 243 10*3/uL (ref 145–400)
RBC: 3.62 10*6/uL — AB (ref 3.70–5.45)
RDW: 12.9 % (ref 11.2–14.5)
WBC: 4.3 10*3/uL (ref 3.9–10.3)

## 2014-11-29 LAB — COMPREHENSIVE METABOLIC PANEL (CC13)
ALK PHOS: 77 U/L (ref 40–150)
ALT: 10 U/L (ref 0–55)
ANION GAP: 10 meq/L (ref 3–11)
AST: 28 U/L (ref 5–34)
Albumin: 3.6 g/dL (ref 3.5–5.0)
BILIRUBIN TOTAL: 0.43 mg/dL (ref 0.20–1.20)
BUN: 7.2 mg/dL (ref 7.0–26.0)
CO2: 24 mEq/L (ref 22–29)
CREATININE: 0.7 mg/dL (ref 0.6–1.1)
Calcium: 9.3 mg/dL (ref 8.4–10.4)
Chloride: 105 mEq/L (ref 98–109)
EGFR: >90 mL/min/{1.73_m2} (ref >90)
Glucose: 86 mg/dl (ref 70–140)
Potassium: 3.7 mEq/L (ref 3.5–5.1)
Sodium: 139 mEq/L (ref 136–145)
TOTAL PROTEIN: 6.8 g/dL (ref 6.4–8.3)

## 2014-11-29 LAB — CANCER ANTIGEN 27.29: CA 27.29: 384 U/mL — ABNORMAL HIGH (ref 0–39)

## 2014-11-29 MED ORDER — ANASTROZOLE 1 MG PO TABS: 1.0000 mg | ORAL_TABLET | Freq: Every day | ORAL | Status: DC

## 2014-11-29 NOTE — Progress Notes (Signed)
Pt would like to apply for the [employer].  I went over what the grant will cover.  She will bring her bank statement or SSI to see if she qualifies.

## 2014-11-29 NOTE — Telephone Encounter (Signed)
Gave avs & calendar for February/march. No pof

## 2014-11-29 NOTE — Progress Notes (Signed)
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 Pt declined surgery or other treatment.     05/20/2011 Tumor Marker right breast mass biopsy showed IDA, ER+/PR+/HER2- (ratio 1.37)   09/04/2011 Cancer Staging PET scan showed no distant mets   10/08/2014 Progression  lumbar spine MRI showed multiple bone metastasis , highly suspicious for  metastatic disease.     HISTORY OF PRESENTING ILLNESS:  Margaret Rubio 74 y.o. female  Was referred by her primary care physician to discuss the management ofprobable metastatic breast cancer.  She was initially diagnosed with right breast cancer in November 2009. 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 In December of 2009, she underwent a right axillary lymph node biopsy on 09/24/2008 which showed metastatic ductal carcinoma. (Case No: [id number]) The tumor was ER 100% positive, PR 0% negative, Ki67 - 22%. 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 The ratio of HER-2:CEP 17 was 2.07.  She decided to pursue nature therapy by diet and exercise, and did not have surgery and other tranditional therapy for her breast cancer.   In May 2011 she underwent a mammogram that showed a 3.8 cm invasive duct carcinoma in the subareolar region of the right breast which had increased since November 2009.  The tumor was ER  99% positive,  PR 4% positive, HER-2 negative ( ratio 1.37).  She was seen by Dr. Estella Husk and Dr. Lucia Gaskins.  However, she failed multiple appointments and sought alternative medical care in [place]. This involved putting something  on her right breast that caused some scarring.  She noticed the enlarged right breast mass and nipple inversion about one year ago. No pain, but some skin change. She developed left low back/buttock and hip and  pain in Nov 2015, was seen by PCP Dr. Jenny Reichmann who prescribed vicodin and tramadol and pain improved with meds. Due to the persistent pain, she underwent a lumber MRI  On 10/08/2014, which showed multiple bone metastasis.  She run out pain medication about a week ago,  But did not call for refill. She states her pain is not bad , but she appears to be uncomfortable when she stands up and walks.  She otherwise feels well overall,  Has good appetite and energy level. She lives alone and still functions well.  She comes in with her niece today.   INTERIM HISTORY: Margaret Rubio returns for follow-up with her daughter. She tried Vicodin once, did not like the way she feels and stopped taking it. She still has moderate low back pain, not taking any pain medication. She denies any other new symptoms. She states she has good appetite, and feels well. She lives alone, able to do all activities independently.  MEDICAL HISTORY:  Past Medical History  Diagnosis Date  . 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 ASTHMA 01/31/2008  . BREAST CANCER, HX OF 11/14/2009  . BRONCHITIS, ACUTE 01/31/2008  . Cough 10/17/2009  . FATIGUE 11/14/2009  . GANGLION CYST 05/01/2009  . HYPERLIPIDEMIA 05/21/2007  . HYPERTENSION 05/05/2007  . LOW BACK PAIN 05/21/2007  . MENOPAUSAL DISORDER 05/22/2010  .  Pain in joint, lower leg 05/10/2008  . RASH-NONVESICULAR 05/01/2009  . SHOULDER PAIN, LEFT 11/14/2009  . 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 VITAMIN B12 DEFICIENCY 05/23/2010  . rt breast ca dx'd 2009    right breast    SURGICAL HISTORY: Past Surgical History  Procedure Laterality Date  . Removal of neck lump      benigh 1986  . Myomectomy      SOCIAL HISTORY: History   Social History  . Marital Status: Widowed    Spouse Name: N/A      Number of Children: 63, lives in [place].   . Years of Education: N/A   Occupational History  . Not on file.   Social History Main Topics  . Smoking status: Never Smoker   . Smokeless tobacco: Not on file  . Alcohol Use: No  . Drug Use: No  . Sexual Activity: Not on file   Other Topics Concern  . Not on file   Social History Narrative    FAMILY HISTORY: Family History  Problem Relation Age of Onset  . Asthma Other   . Diabetes Other   . Hypertension Other   . Hypertension Father   . Heart disease Father   Paternal aunt had cancer, unknown type. No other family history of malignancy   ALLERGIES:  has No Known Allergies.  MEDICATIONS:  Current Outpatient Prescriptions  Medication Sig Dispense Refill  . Ascorbic Acid (VITAMIN C) 100 MG CHEW Chew by mouth every morning.    Marland Kitchen aspirin EC 81 MG tablet Take 1 tablet (81 mg total) by mouth daily. 90 tablet 11  . ergocalciferol (VITAMIN D2) 50000 UNITS capsule Take 1 capsule (50,000 Units total) by mouth once a week. 4 capsule 3  . HYDROcodone-acetaminophen (NORCO/VICODIN) 5-325 MG per tablet Take 1 tablet by mouth every 6 (six) hours as needed for moderate pain. 60 tablet 0  . morphine (MS CONTIN) 15 MG 12 hr tablet Take 1 tablet (15 mg total) by mouth every 12 (twelve) hours. 60 tablet 0  . 0    0  . tiZANidine (ZANAFLEX) 4 MG tablet Take 1 tablet (4 mg total) by mouth every 6 (six) hours as needed for muscle spasms. 40 tablet 1   No current facility-administered medications for this visit.    REVIEW OF SYSTEMS:   Constitutional: Denies fevers, chills or abnormal night sweats Eyes: Denies blurriness of vision, double vision or watery eyes Ears, nose, mouth, throat, and face: Denies mucositis or sore throat Respiratory: Denies cough, dyspnea or wheezes Cardiovascular: Denies palpitation, chest discomfort or lower extremity swelling Gastrointestinal:  Denies nausea, heartburn or change in bowel habits Skin: Denies abnormal  skin rashes Lymphatics: Denies new lymphadenopathy or easy bruising Neurological:Denies numbness, tingling or new weaknesses Behavioral/Psych: Mood is stable, no new changes  All other systems were reviewed with the patient and are negative.  PHYSICAL EXAMINATION: ECOG PERFORMANCE STATUS: 1 - Symptomatic but completely ambulatory  Filed Vitals:   11/29/14 0822  BP: 146/66  Pulse: 62  Temp: 98.5 F (36.9 C)  Resp: 18   Filed Weights   11/29/14 0822  Weight: 181 lb (82.101 kg)    GENERAL:alert, no distress and comfortable SKIN: skin color, texture, turgor are normal, no rashes or significant lesions EYES: normal, conjunctiva are pink and non-injected, sclera clear OROPHARYNX:no exudate, no erythema and lips, buccal mucosa, and tongue normal  NECK: supple, thyroid normal size, non-tender, without nodularity LYMPH:  no palpable lymphadenopathy in the cervical, axillary or inguinal LUNGS:  clear to auscultation and percussion with normal breathing effort HEART: regular rate & rhythm and no murmurs and no lower extremity edema ABDOMEN:abdomen soft, non-tender and normal bowel sounds Musculoskeletal:no cyanosis of digits and no clubbing  PSYCH: alert & oriented x 3 with fluent speech NEURO: no focal motor/sensory deficits Breasts: Breast inspection showed  Significantly enlarged right breast, compared to left breast, (+) skin pigmentations , nipple inversion and surrounding dry ulcer/ scar change , no discharge.  The entire breast is very firm , nontender , no discrete mass. Palpation of the  Left breasts and axilla revealed no obvious mass that I could appreciate.   LABORATORY DATA:  I have reviewed the data as listed Lab Results  Component Value Date   WBC 4.3 11/29/2014   HGB 10.5* 11/29/2014   HCT 32.1* 11/29/2014   MCV 88.7 11/29/2014   PLT 243 11/29/2014    Recent Labs  08/21/14 1524 10/30/14 1302 11/29/14 0805  NA 138 138 139  K 3.8 4.2 3.7  CL 104  --   --    CO2 $Re'26 26 24  'eXk$ GLUCOSE 83 100 86  BUN 10 8.8 7.2  CREATININE 0.6 0.7 0.7  CALCIUM 9.4 8.9 9.3  PROT 7.1 6.7 6.8  ALBUMIN 3.4* 3.6 3.6  AST 32 28 28  ALT $Re'14 9 10  'trG$ ALKPHOS 65 77 77  BILITOT 0.5 0.54 0.43  BILIDIR 0.1  --   --    PATHOLOGY REPORT  RIGHT BREAST MASS, 6 O'CLOCK, NEEDLE CORE BIOPSIES: 09/03/2008 MICROSCOPIC FOCUS OF DUCTAL CARCINOMA IN SITU ASSOCIATED WITH MICROSCOPIC FOCUS OF MICROINVASION. SEE COMMENT.  LYMPH NODE, RIGHT AXILLARY, NEEDLE CORE BIOPSIES: METASTATIC DUCTAL CARCINOMA.  09/24/2008  COMMENT There are needle core biopsies of lymph node showing partial replacement by metastatic adenocarcinoma consistent with metastasis from a breast primary, metastatic ductal carcinoma. Since the breast prognostic profile is not performed on the previous material because of limited invasive tumor ([id number]), the breast prognostic profile will be performed on the tumor in the lymph node and reported in a separate report. Interpretation HER-2/NEU BY CISH -- SHOWS AMPLIFICATION BY CISH ANALYSIS. THE RATIO OF HER-2: CEP 17 SIGNALS WAS 2.07  Diagnosis 05/20/2011 Breast, right, needle core biopsy - INVASIVE DUCTAL CARCINOMA, SEE COMMENT. - LYMPHOVASCULAR INVASION PRESENT. Estrogen Receptor (Negative, <1%): 99%, STRONG STAINING INTENSITY Progesterone Receptor (Negative, <1%): 4%, STRONG HER-2/NEU BY CISH - NO AMPLIFICATION OF HER-2 DETECTED. THE RATIO OF HER-2: CEP 17 SIGNALS WAS 1.38.  RADIOGRAPHIC STUDIES: I have personally reviewed the radiological images as listed and agreed with the findings in the report.  Mr Lumbar Spine Wo Contrast 10/08/2014   IMPRESSION: 1. New diffuse lumbosacral metastatic disease compatible with metastatic breast cancer. No cauda equina compression or extraosseous extension of tumor. No pathologic compression fracture. 2. 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 In this patient with LEFT hip and gluteal pain, the  metastatic disease is the most likely cause however there is also potential for facet mediated referred pain or irritation of the exited LEFT L3 nerve root at L3-L4. 3. These results will be called to the ordering clinician or representative by the Radiologist Assistant, and communication documented in the PACS or zVision Dashboard.   Electronically Signed   By: Dereck Ligas M.D.   On: 10/08/2014 16:33    ASSESSMENT & PLAN:   Margaret Rubio is a 74 year old  African-American female, who was diagnosed with node positive right breast  Invasive adenocarcinoma in  2009, untreated,  Now presents with low  back pain and lumbar spine MRI is highly suspicious for bone metastasis.  1.  Right breast IDA,  Strongly ER positive,  PR weakly positive,  HER-2 positive on first biopsy , but negative on second biopsy 3 years later, with diffuse bone metastatic now, stage IV  - it has been over 6 years since her initial diagnosis. Her lumbar MRI is very suspicious for bone metastasis.  Her disease course is more consistent with ER positive , HER-2 negative disease. - The discussed her PET scan results with her and her daughter. 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 -I recommend bone biopsy to confirm the metastasis, and also repeat ER/PR/HER-2 to guide further therapy. Her daughter strongly encouraged her to consider biopsy but she he declined after long conversation. She is also very hesitated to have a breast mass biopsy. - I discussed that her disease at this stage is unlikely curable , I would not offer surgery. The goal of treatment is palliative. - Giving the bone metastasis only, with recommend endocrine therapy as a first-line treatment. I discussed aromatase inhibitors with or without palbociclib, potential side effects of each medication were discussed with patient, which includes but not limited to, fatigue, hot flash, vaginal dryness, musculoskeletal pain and stiffness, cytopenia,  risk of infection, she finally agreed to start aromatase inhibitor alone. I sent a prescription of anastrozole to her pharmacy today. She will start tomorrow. -She is very interested in complementary therapy, I spoke with the practitioner who offers her countryman to therapy, and she'll bring a list of supplements she will take. I explained the possibility of interaction between this and anastrozole, but that she seems still want to take it regardless.   2.  Low back/ hip pain,  Secondary to metastatic cancer - I encouraged her to take pain medication Vicodin, she agrees to try.  Plan: - start Arimidex tomorrow -RTC in 2 weeks -please consider bone biopsy    All questions were answered. The patient knows to call the clinic with any problems, questions or concerns. I spent 30 minutes counseling the patient face to face. The total time spent in the appointment was 40 minutes and more than 50% was on counseling.     Truitt Merle, MD 11/29/2014 6:00 PM

## 2014-12-13 ENCOUNTER — Telehealth: Payer: Self-pay | Admitting: Hematology

## 2014-12-13 ENCOUNTER — Ambulatory Visit
Admission: RE | Admit: 2014-12-13 | Discharge: 2014-12-13 | Disposition: A | Discharge status: Home / Self Care | Payer: Medicare Other | Source: Ambulatory Visit | Attending: Hematology | Admitting: Hematology

## 2014-12-13 ENCOUNTER — Encounter: Payer: Self-pay | Admitting: Hematology

## 2014-12-13 ENCOUNTER — Other Ambulatory Visit: Payer: Self-pay | Admitting: Hematology

## 2014-12-13 ENCOUNTER — Ambulatory Visit (HOSPITAL_BASED_OUTPATIENT_CLINIC_OR_DEPARTMENT_OTHER): Payer: Medicare Other | Admitting: Hematology

## 2014-12-13 VITALS — BP 137/67 | HR 68 | Temp 98.6°F | Resp 18 | Ht 65.0 in | Wt 184.0 lb

## 2014-12-13 DIAGNOSIS — C7951 Secondary malignant neoplasm of bone: Secondary | ICD-10-CM | POA: Diagnosis not present

## 2014-12-13 DIAGNOSIS — Z170 Estrogen receptor positive status [ER+]: Secondary | ICD-10-CM

## 2014-12-13 DIAGNOSIS — C50911 Malignant neoplasm of unspecified site of right female breast: Secondary | ICD-10-CM

## 2014-12-13 DIAGNOSIS — C773 Secondary and unspecified malignant neoplasm of axilla and upper limb lymph nodes: Secondary | ICD-10-CM | POA: Diagnosis not present

## 2014-12-13 DIAGNOSIS — C50111 Malignant neoplasm of central portion of right female breast: Secondary | ICD-10-CM

## 2014-12-13 DIAGNOSIS — C50511 Malignant neoplasm of lower-outer quadrant of right female breast: Secondary | ICD-10-CM | POA: Diagnosis not present

## 2014-12-13 DIAGNOSIS — N63 Unspecified lump in breast: Secondary | ICD-10-CM | POA: Diagnosis not present

## 2014-12-13 NOTE — Progress Notes (Signed)
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 Pt declined surgery or other treatment.     05/20/2011 Tumor Marker right breast mass biopsy showed IDA, ER+/PR+/HER2- (ratio 1.37)   09/04/2011 Cancer Staging PET scan showed no distant mets   10/08/2014 Progression  lumbar spine MRI showed multiple bone metastasis , highly suspicious for  metastatic disease.     HISTORY OF PRESENTING ILLNESS:  Margaret Rubio 74 y.o. female  Was referred by her primary care physician to discuss the management ofprobable metastatic breast cancer.  She was initially diagnosed with right breast cancer in November 2009. 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 In December of 2009, she underwent a right axillary lymph node biopsy on 09/24/2008 which showed metastatic ductal carcinoma. (Case No: [id number]) The tumor was ER 100% positive, PR 0% negative, Ki67 - 22%. 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 The ratio of HER-2:CEP 17 was 2.07.  She decided to pursue nature therapy by diet and exercise, and did not have surgery and other tranditional therapy for her breast cancer.   In May 2011 she underwent a mammogram that showed a 3.8 cm invasive duct carcinoma in the subareolar region of the right breast which had increased since November 2009.  The tumor was ER  99% positive,  PR 4% positive, HER-2 negative ( ratio 1.37).  She was seen by Dr. Estella Husk and Dr. Lucia Gaskins.  However, she failed multiple appointments and sought alternative medical care in [place]. This involved putting something on  her right breast that caused some scarring.  She noticed the enlarged right breast mass and nipple inversion about one year ago. No pain, but some skin change. She developed left low back/buttock and hip and  pain in Nov 2015, was seen by PCP Dr. Jenny Reichmann who prescribed vicodin and tramadol and pain improved with meds. Due to the persistent pain, she underwent a lumber MRI  On 10/08/2014, which showed multiple bone metastasis.  She run out pain medication about a week ago,  But did not call for refill. She states her pain is not bad , but she appears to be uncomfortable when she stands up and walks.  She otherwise feels well overall,  Has good appetite and energy level. She lives alone and still functions well.  She comes in with her niece today.   INTERIM HISTORY: [name] returns for follow-up with her daughter. She feels that the prescription of anastrozole, but has not started yet. She is still concerned about the side effect of medication. She states her left hip pain is stable, she only takes Tylenol as needed. She does use a cane. She lives alone, able to take care of herself. Her daughter came from [place] and accommodated her to the clinic today. She denies any other new symptoms.   MEDICAL HISTORY:  Past Medical History  Diagnosis Date  . 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 ASTHMA 01/31/2008  . BREAST CANCER, HX OF 11/14/2009  . BRONCHITIS, ACUTE 01/31/2008  . Cough 10/17/2009  . FATIGUE 11/14/2009  . GANGLION CYST 05/01/2009  .  HYPERLIPIDEMIA 05/21/2007  . HYPERTENSION 05/05/2007  . LOW BACK PAIN 05/21/2007  . MENOPAUSAL DISORDER 05/22/2010  . Pain in joint, lower leg 05/10/2008  . RASH-NONVESICULAR 05/01/2009  . SHOULDER PAIN, LEFT 11/14/2009  . 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 VITAMIN B12 DEFICIENCY 05/23/2010  . rt breast ca dx'd 2009    right breast    SURGICAL HISTORY: Past Surgical History  Procedure Laterality Date  . Removal of neck lump      benigh 1986  . Myomectomy       SOCIAL HISTORY: History   Social History  . Marital Status: Widowed    Spouse Name: N/A    Number of Children: 81, lives in [place].   . Years of Education: N/A   Occupational History  . Not on file.   Social History Main Topics  . Smoking status: Never Smoker   . Smokeless tobacco: Not on file  . Alcohol Use: No  . Drug Use: No  . Sexual Activity: Not on file   Other Topics Concern  . Not on file   Social History Narrative    FAMILY HISTORY: Family History  Problem Relation Age of Onset  . Asthma Other   . Diabetes Other   . Hypertension Other   . Hypertension Father   . Heart disease Father   Paternal aunt had cancer, unknown type. No other family history of malignancy   ALLERGIES:  has No Known Allergies.  MEDICATIONS:  Current Outpatient Prescriptions  Medication Sig Dispense Refill  . Ascorbic Acid (VITAMIN C) 100 MG CHEW Chew by mouth every morning.    Marland Kitchen aspirin EC 81 MG tablet Take 1 tablet (81 mg total) by mouth daily. 90 tablet 11  . ergocalciferol (VITAMIN D2) 50000 UNITS capsule Take 1 capsule (50,000 Units total) by mouth once a week. 4 capsule 3  . HYDROcodone-acetaminophen (NORCO/VICODIN) 5-325 MG per tablet Take 1 tablet by mouth every 6 (six) hours as needed for moderate pain. 60 tablet 0  . morphine (MS CONTIN) 15 MG 12 hr tablet Take 1 tablet (15 mg total) by mouth every 12 (twelve) hours. 60 tablet 0  . 0    0  . tiZANidine (ZANAFLEX) 4 MG tablet Take 1 tablet (4 mg total) by mouth every 6 (six) hours as needed for muscle spasms. 40 tablet 1   No current facility-administered medications for this visit.    REVIEW OF SYSTEMS:   Constitutional: Denies fevers, chills or abnormal night sweats Eyes: Denies blurriness of vision, double vision or watery eyes Ears, nose, mouth, throat, and face: Denies mucositis or sore throat Respiratory: Denies cough, dyspnea or wheezes Cardiovascular: Denies palpitation, chest discomfort or lower extremity  swelling Gastrointestinal:  Denies nausea, heartburn or change in bowel habits Skin: Denies abnormal skin rashes Lymphatics: Denies new lymphadenopathy or easy bruising Neurological:Denies numbness, tingling or new weaknesses Behavioral/Psych: Mood is stable, no new changes  All other systems were reviewed with the patient and are negative.  PHYSICAL EXAMINATION: ECOG PERFORMANCE STATUS: 1 - Symptomatic but completely ambulatory  Filed Vitals:   12/13/14 0828  BP: 137/67  Pulse: 68  Temp: 98.6 F (37 C)  Resp: 18   Filed Weights   12/13/14 0828  Weight: 184 lb (83.462 kg)    GENERAL:alert, no distress and comfortable SKIN: skin color, texture, turgor are normal, no rashes or significant lesions EYES: normal, conjunctiva are pink and non-injected, sclera clear OROPHARYNX:no exudate, no erythema and lips, buccal mucosa, and tongue normal  NECK: supple,  thyroid normal size, non-tender, without nodularity LYMPH:  no palpable lymphadenopathy in the cervical, axillary or inguinal LUNGS: clear to auscultation and percussion with normal breathing effort HEART: regular rate & rhythm and no murmurs and no lower extremity edema ABDOMEN:abdomen soft, non-tender and normal bowel sounds Musculoskeletal:no cyanosis of digits and no clubbing  PSYCH: alert & oriented x 3 with fluent speech NEURO: no focal motor/sensory deficits Breasts: Breast inspection showed  Significantly enlarged right breast, compared to left breast, (+) skin pigmentations , nipple inversion and surrounding dry ulcer/ scar change , no discharge.  The entire breast is very firm , nontender , no discrete mass. Palpation of the  Left breasts and axilla revealed no obvious mass that I could appreciate.   LABORATORY DATA:  I have reviewed the data as listed Lab Results  Component Value Date   WBC 4.3 11/29/2014   HGB 10.5* 11/29/2014   HCT 32.1* 11/29/2014   MCV 88.7 11/29/2014   PLT 243 11/29/2014    Recent Labs   08/21/14 1524 10/30/14 1302 11/29/14 0805  NA 138 138 139  K 3.8 4.2 3.7  CL 104  --   --   CO2 $Re'26 26 24  'KaQ$ GLUCOSE 83 100 86  BUN 10 8.8 7.2  CREATININE 0.6 0.7 0.7  CALCIUM 9.4 8.9 9.3  PROT 7.1 6.7 6.8  ALBUMIN 3.4* 3.6 3.6  AST 32 28 28  ALT $Re'14 9 10  'ZnN$ ALKPHOS 65 77 77  BILITOT 0.5 0.54 0.43  BILIDIR 0.1  --   --    PATHOLOGY REPORT  RIGHT BREAST MASS, 6 O'CLOCK, NEEDLE CORE BIOPSIES: 09/03/2008 MICROSCOPIC FOCUS OF DUCTAL CARCINOMA IN SITU ASSOCIATED WITH MICROSCOPIC FOCUS OF MICROINVASION. SEE COMMENT.  LYMPH NODE, RIGHT AXILLARY, NEEDLE CORE BIOPSIES: METASTATIC DUCTAL CARCINOMA.  09/24/2008  COMMENT There are needle core biopsies of lymph node showing partial replacement by metastatic adenocarcinoma consistent with metastasis from a breast primary, metastatic ductal carcinoma. Since the breast prognostic profile is not performed on the previous material because of limited invasive tumor ([id number]), the breast prognostic profile will be performed on the tumor in the lymph node and reported in a separate report. Interpretation HER-2/NEU BY CISH -- SHOWS AMPLIFICATION BY CISH ANALYSIS. THE RATIO OF HER-2: CEP 17 SIGNALS WAS 2.07  Diagnosis 05/20/2011 Breast, right, needle core biopsy - INVASIVE DUCTAL CARCINOMA, SEE COMMENT. - LYMPHOVASCULAR INVASION PRESENT. Estrogen Receptor (Negative, <1%): 99%, STRONG STAINING INTENSITY Progesterone Receptor (Negative, <1%): 4%, STRONG HER-2/NEU BY CISH - NO AMPLIFICATION OF HER-2 DETECTED. THE RATIO OF HER-2: CEP 17 SIGNALS WAS 1.38.  RADIOGRAPHIC STUDIES: I have personally reviewed the radiological images as listed and agreed with the findings in the report.  Mr Lumbar Spine Wo Contrast 10/08/2014   IMPRESSION: 1. New diffuse lumbosacral metastatic disease compatible with metastatic breast cancer. No cauda equina compression or extraosseous extension of tumor. No pathologic compression fracture. 2. Mild to  moderate lumbar spondylosis detailed above. In this patient with LEFT hip and gluteal pain, the metastatic disease is the most likely cause however there is also potential for facet mediated referred pain or irritation of the exited LEFT L3 nerve root at L3-L4. 3. These results will be called to the ordering clinician or representative by the Radiologist Assistant, and communication documented in the PACS or zVision Dashboard.   Electronically Signed   By: Dereck Ligas M.D.   On: 10/08/2014 16:33    ASSESSMENT & PLAN:   Mrs. Briski is a 74 year old  African-American female, who was  diagnosed with node positive right breast  Invasive adenocarcinoma in  2009, untreated,  Now presents with low back pain and lumbar spine MRI is highly suspicious for bone metastasis.  1.  Right breast IDA,  Strongly ER positive,  PR weakly positive,  HER-2 positive on first biopsy , but negative on second biopsy 3 years later, with diffuse bone metastatic now, stage IV  - it has been over 6 years since her initial diagnosis. Her lumbar MRI is very suspicious for bone metastasis.  Her disease course is more consistent with ER positive , HER-2 negative disease. - The discussed her PET scan results with her and her daughter. 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 -I recommend bone biopsy to confirm the metastasis, and also repeat ER/PR/HER-2 to guide further therapy. Her daughter strongly encouraged her to consider biopsy but she again declined bone biopsy after long conversation. She finally agreed with a breast biopsy. -I called [facility] and arranged her breast biopsy for today. - I discussed that her disease at this stage is unlikely curable , I would not offer surgery. The goal of treatment is palliative. - Giving the bone metastasis only, I recommend endocrine therapy as a first-line treatment. I discussed aromatase inhibitors with or without palbociclib, potential  side effects of each medication were discussed with patient, which includes but not limited to, fatigue, hot flash, vaginal dryness, musculoskeletal pain and stiffness, cytopenia, risk of infection. -She finally agreed to start anastrozole today which she already field.  -She is not willing to try palbociclib at this point. -She is very interested in complementary therapy, I spoke with the practitioner who offers her countryman to therapy, and she'll bring a list of supplements she will take. I explained the possibility of interaction between this and anastrozole, but that she seems still want to take it regardless.   2.  Low back/ hip pain,  Secondary to metastatic cancer - I encouraged her to take pain medication Vicodin, she agrees to try.  Plan: - start Arimidex today -RTC in 2 weeks -breast biopsy today, to repeat HER2 status    All questions were answered. The patient knows to call the clinic with any problems, questions or concerns. I spent 30 minutes counseling the patient face to face. The total time spent in the appointment was 40 minutes and more than 50% was on counseling.     Truitt Merle, MD 12/13/2014

## 2014-12-13 NOTE — Telephone Encounter (Signed)
gv adn printed appt sched adn avs for pt for March....pt sched to go to [facility] at 12:45pm

## 2014-12-20 ENCOUNTER — Telehealth: Payer: Self-pay

## 2014-12-23 NOTE — Telephone Encounter (Signed)
Unable to confirm status of flu; could not reach patient

## 2014-12-27 ENCOUNTER — Other Ambulatory Visit (HOSPITAL_BASED_OUTPATIENT_CLINIC_OR_DEPARTMENT_OTHER): Payer: Medicare Other

## 2014-12-27 ENCOUNTER — Ambulatory Visit (HOSPITAL_BASED_OUTPATIENT_CLINIC_OR_DEPARTMENT_OTHER): Payer: Medicare Other | Admitting: Hematology

## 2014-12-27 ENCOUNTER — Encounter: Payer: Self-pay | Admitting: Hematology

## 2014-12-27 ENCOUNTER — Telehealth: Payer: Self-pay | Admitting: Hematology

## 2014-12-27 VITALS — BP 135/75 | HR 65 | Temp 98.3°F | Resp 18 | Ht 65.0 in | Wt 187.0 lb

## 2014-12-27 DIAGNOSIS — Z170 Estrogen receptor positive status [ER+]: Secondary | ICD-10-CM | POA: Diagnosis not present

## 2014-12-27 DIAGNOSIS — C50911 Malignant neoplasm of unspecified site of right female breast: Secondary | ICD-10-CM | POA: Diagnosis not present

## 2014-12-27 DIAGNOSIS — C50111 Malignant neoplasm of central portion of right female breast: Secondary | ICD-10-CM | POA: Diagnosis not present

## 2014-12-27 DIAGNOSIS — D649 Anemia, unspecified: Secondary | ICD-10-CM | POA: Diagnosis not present

## 2014-12-27 DIAGNOSIS — C7951 Secondary malignant neoplasm of bone: Secondary | ICD-10-CM

## 2014-12-27 LAB — CBC WITH DIFFERENTIAL/PLATELET
BASO%: 0.6 % (ref 0.0–2.0)
Basophils Absolute: 0 10*3/uL (ref 0.0–0.1)
EOS ABS: 0 10*3/uL (ref 0.0–0.5)
EOS%: 1.2 % (ref 0.0–7.0)
HCT: 29.7 % — ABNORMAL LOW (ref 34.8–46.6)
HGB: 9.4 g/dL — ABNORMAL LOW (ref 11.6–15.9)
LYMPH%: 32.2 % (ref 14.0–49.7)
MCH: 28.5 pg (ref 25.1–34.0)
MCHC: 31.6 g/dL (ref 31.5–36.0)
MCV: 90.2 fL (ref 79.5–101.0)
MONO#: 0.3 10*3/uL (ref 0.1–0.9)
MONO%: 7.9 % (ref 0.0–14.0)
NEUT#: 2.3 10*3/uL (ref 1.5–6.5)
NEUT%: 58.1 % (ref 38.4–76.8)
Platelets: 214 10*3/uL (ref 145–400)
RBC: 3.3 10*6/uL — ABNORMAL LOW (ref 3.70–5.45)
RDW: 13.5 % (ref 11.2–14.5)
WBC: 4 10*3/uL (ref 3.9–10.3)
lymph#: 1.3 10*3/uL (ref 0.9–3.3)

## 2014-12-27 LAB — COMPREHENSIVE METABOLIC PANEL (CC13)
ALK PHOS: 78 U/L (ref 40–150)
ALT: 13 U/L (ref 0–55)
ANION GAP: 6 meq/L (ref 3–11)
AST: 25 U/L (ref 5–34)
Albumin: 3.4 g/dL — ABNORMAL LOW (ref 3.5–5.0)
BILIRUBIN TOTAL: 0.42 mg/dL (ref 0.20–1.20)
BUN: 10.1 mg/dL (ref 7.0–26.0)
CO2: 26 mEq/L (ref 22–29)
Calcium: 8.7 mg/dL (ref 8.4–10.4)
Chloride: 106 mEq/L (ref 98–109)
Creatinine: 0.7 mg/dL (ref 0.6–1.1)
EGFR: >90 mL/min/{1.73_m2} (ref >90)
Glucose: 85 mg/dl (ref 70–140)
Potassium: 4.1 mEq/L (ref 3.5–5.1)
Sodium: 138 mEq/L (ref 136–145)
Total Protein: 6.4 g/dL (ref 6.4–8.3)

## 2014-12-27 LAB — CANCER ANTIGEN 27.29: CA 27.29: 414 U/mL — ABNORMAL HIGH (ref 0–39)

## 2014-12-27 NOTE — Progress Notes (Signed)
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 Pt declined surgery or other treatment.     05/20/2011 Tumor Marker right breast mass biopsy showed IDA, ER+/PR+/HER2- (ratio 1.37)   09/04/2011 Cancer Staging PET scan showed no distant mets   10/08/2014 Progression  lumbar spine MRI showed multiple bone metastasis , highly suspicious for  metastatic disease.   12/13/2014 Pathology Results Right breast needle biopsy showed invasive ductal carcinoma, ER 100% positive, PR 2% positive, HER-2 negative with ratio 1.45 and copy number 2.25   12/14/2014 -  Anti-estrogen oral therapy Arimidex $RemoveBeforeDE'1mg'[id number]$  daily      HISTORY OF PRESENTING ILLNESS:  Margaret Rubio 74 y.o. female  Was referred by her primary care physician to discuss the management ofprobable metastatic breast cancer.  She was initially diagnosed with right breast cancer in November 2009. 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 In December of 2009, she underwent a right axillary lymph node biopsy on 09/24/2008 which showed metastatic ductal carcinoma. (Case No: [id number]) The tumor was ER 100% positive, PR 0% negative, Ki67 - 22%. 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 The ratio of HER-2:CEP 17 was 2.07.  She decided to pursue nature therapy by diet and exercise, and did not have surgery and other tranditional therapy for her breast cancer.   In May 2011 she underwent a mammogram that showed a 3.8 cm invasive duct carcinoma in the subareolar region of the right breast which had increased since November 2009.  The tumor was ER   99% positive,  PR 4% positive, HER-2 negative ( ratio 1.37).  She was seen by Dr. Estella Husk and Dr. Lucia Gaskins.  However, she failed multiple appointments and sought alternative medical care in [place]. This involved putting something on her right breast that caused some scarring.  She noticed the enlarged right breast mass and nipple inversion about one year ago. No pain, but some skin change. She developed left low back/buttock and hip and  pain in Nov 2015, was seen by PCP Dr. Jenny Reichmann who prescribed vicodin and tramadol and pain improved with meds. Due to the persistent pain, she underwent a lumber MRI  On 10/08/2014, which showed multiple bone metastasis.  She run out pain medication about a week ago,  But did not call for refill. She states her pain is not bad , but she appears to be uncomfortable when she stands up and walks.  She otherwise feels well overall,  Has good appetite and energy level. She lives alone and still functions well.  She comes in with her niece today.   Current therapy: Anastrozole 1 mg once daily started on 12/10/2014  INTERIM HISTORY: Ms. Renier returns for follow-up with her daughter. She stared Arimidex 2 weeks ago, tolerating it well. She had a few episodes of night sweats, but overall half rash is mild and tolerable. She also notices her movement is little slower than before,  still has intermittent left buttock pain, and she takes pain medication only as needed. She otherwise denies any other new symptoms, functions well at home.  MEDICAL HISTORY:  Past Medical History  Diagnosis Date  . 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 ASTHMA 01/31/2008  . BREAST CANCER, HX OF 11/14/2009  . BRONCHITIS, ACUTE 01/31/2008  . Cough 10/17/2009  . FATIGUE 11/14/2009  . GANGLION CYST 05/01/2009  . HYPERLIPIDEMIA 05/21/2007  . HYPERTENSION 05/05/2007  . LOW BACK PAIN 05/21/2007  . MENOPAUSAL DISORDER 05/22/2010  . Pain in joint, lower leg 05/10/2008  . RASH-NONVESICULAR 05/01/2009  .  SHOULDER PAIN, LEFT 11/14/2009  . 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 VITAMIN B12 DEFICIENCY 05/23/2010  . rt breast ca dx'd 2009    right breast    SURGICAL HISTORY: Past Surgical History  Procedure Laterality Date  . Removal of neck lump      benigh 1986  . Myomectomy      SOCIAL HISTORY: History   Social History  . Marital Status: Widowed    Spouse Name: N/A    Number of Children: 3, lives in [place].   . Years of Education: N/A   Occupational History  . Not on file.   Social History Main Topics  . Smoking status: Never Smoker   . Smokeless tobacco: Not on file  . Alcohol Use: No  . Drug Use: No  . Sexual Activity: Not on file   Other Topics Concern  . Not on file   Social History Narrative    FAMILY HISTORY: Family History  Problem Relation Age of Onset  . Asthma Other   . Diabetes Other   . Hypertension Other   . Hypertension Father   . Heart disease Father   Paternal aunt had cancer, unknown type. No other family history of malignancy   ALLERGIES:  has No Known Allergies.  MEDICATIONS:  Current Outpatient Prescriptions  Medication Sig Dispense Refill  . Ascorbic Acid (VITAMIN C) 100 MG CHEW Chew by mouth every morning.    Marland Kitchen aspirin EC 81 MG tablet Take 1 tablet (81 mg total) by mouth daily. 90 tablet 11  . ergocalciferol (VITAMIN D2) 50000 UNITS capsule Take 1 capsule (50,000 Units total) by mouth once a week. 4 capsule 3  . HYDROcodone-acetaminophen (NORCO/VICODIN) 5-325 MG per tablet Take 1 tablet by mouth every 6 (six) hours as needed for moderate pain. 60 tablet 0  . morphine (MS CONTIN) 15 MG 12 hr tablet Take 1 tablet (15 mg total) by mouth every 12 (twelve) hours. 60 tablet 0  . 0    0  . tiZANidine (ZANAFLEX) 4 MG tablet Take 1 tablet (4 mg total) by mouth every 6 (six) hours as needed for muscle spasms. 40 tablet 1   No current facility-administered medications for this visit.    REVIEW OF SYSTEMS:   Constitutional: Denies fevers, chills or  abnormal night sweats Eyes: Denies blurriness of vision, double vision or watery eyes Ears, nose, mouth, throat, and face: Denies mucositis or sore throat Respiratory: Denies cough, dyspnea or wheezes Cardiovascular: Denies palpitation, chest discomfort or lower extremity swelling Gastrointestinal:  Denies nausea, heartburn or change in bowel habits Skin: Denies abnormal skin rashes Lymphatics: Denies new lymphadenopathy or easy bruising Neurological:Denies numbness, tingling or new weaknesses Behavioral/Psych: Mood is stable, no new changes  All other systems were reviewed with the patient and are negative.  PHYSICAL EXAMINATION: ECOG PERFORMANCE STATUS: 1 - Symptomatic but completely ambulatory  Filed Vitals:   12/27/14 0857  BP: 135/75  Pulse: 65  Temp: 98.3 F (36.8 C)  Resp: 18   Filed Weights   12/27/14 0857  Weight: 187 lb (84.823 kg)  GENERAL:alert, no distress and comfortable SKIN: skin color, texture, turgor are normal, no rashes or significant lesions EYES: normal, conjunctiva are pink and non-injected, sclera clear OROPHARYNX:no exudate, no erythema and lips, buccal mucosa, and tongue normal  NECK: supple, thyroid normal size, non-tender, without nodularity LYMPH:  no palpable lymphadenopathy in the cervical, axillary or inguinal LUNGS: clear to auscultation and percussion with normal breathing effort HEART: regular rate & rhythm and no murmurs and no lower extremity edema ABDOMEN:abdomen soft, non-tender and normal bowel sounds Musculoskeletal:no cyanosis of digits and no clubbing  PSYCH: alert & oriented x 3 with fluent speech NEURO: no focal motor/sensory deficits Breasts: Breast inspection showed  Significantly enlarged right breast, compared to left breast, (+) skin pigmentations , nipple inversion and surrounding dry ulcer/ scar change , no discharge.  The entire breast is very firm , nontender , no discrete mass. Palpation of the  Left breasts and axilla  revealed no obvious mass that I could appreciate.   LABORATORY DATA:  I have reviewed the data as listed Lab Results  Component Value Date   WBC 4.0 12/27/2014   HGB 9.4* 12/27/2014   HCT 29.7* 12/27/2014   MCV 90.2 12/27/2014   PLT 214 12/27/2014    Recent Labs  08/21/14 1524 10/30/14 1302 11/29/14 0805 12/27/14 0825  NA 138 138 139 138  K 3.8 4.2 3.7 4.1  CL 104  --   --   --   CO2 $Re'26 26 24 26  'QPf$ GLUCOSE 83 100 86 85  BUN 10 8.8 7.2 10.1  CREATININE 0.6 0.7 0.7 0.7  CALCIUM 9.4 8.9 9.3 8.7  PROT 7.1 6.7 6.8 6.4  ALBUMIN 3.4* 3.6 3.6 3.4*  AST 32 $Remo'28 28 25  'EDeVb$ ALT $Rem'14 9 10 13  'aBxM$ ALKPHOS 65 77 77 78  BILITOT 0.5 0.54 0.43 0.42  BILIDIR 0.1  --   --   --    CA 27.29  Status: Finalresult Visible to patient:  Not Released Nextappt: Today at 08:45 AM in [facility], Krista Blue, MD) Dx:  Breast cancer, right              Ref Range 4wk ago  49mo ago  68yr ago     CA 27.29 0 - 39 U/mL 384 (H) 355 (H) 28         PATHOLOGY REPORT  RIGHT BREAST MASS, 6 O'CLOCK, NEEDLE CORE BIOPSIES: 09/03/2008 MICROSCOPIC FOCUS OF DUCTAL CARCINOMA IN SITU ASSOCIATED WITH MICROSCOPIC FOCUS OF MICROINVASION. SEE COMMENT.  LYMPH NODE, RIGHT AXILLARY, NEEDLE CORE BIOPSIES: METASTATIC DUCTAL CARCINOMA.  09/24/2008  COMMENT There are needle core biopsies of lymph node showing partial replacement by metastatic adenocarcinoma consistent with metastasis from a breast primary, metastatic ductal carcinoma. Since the breast prognostic profile is not performed on the previous material because of limited invasive tumor ([id number]), the breast prognostic profile will be performed on the tumor in the lymph node and reported in a separate report. Interpretation HER-2/NEU BY CISH -- SHOWS AMPLIFICATION BY CISH ANALYSIS. THE RATIO OF HER-2: CEP 17 SIGNALS WAS 2.07  Diagnosis 05/20/2011 Breast, right, needle core biopsy - INVASIVE DUCTAL CARCINOMA, SEE COMMENT. -  LYMPHOVASCULAR INVASION PRESENT. Estrogen Receptor (Negative, <1%): 99%, STRONG STAINING INTENSITY Progesterone Receptor (Negative, <1%): 4%, STRONG HER-2/NEU BY CISH - NO AMPLIFICATION OF HER-2 DETECTED. THE RATIO OF HER-2: CEP 17 SIGNALS WAS 1.38.  Breast, right, needle core biopsy, 6:30 o'clock 12/13/2014 - INVASIVE MAMMARY CARCINOMA. - SEE MICROSCOPIC DESCRIPTION. Microscopic Comment The features favor Grade II invasive ductal  carcinoma. A breast prognostic profile will be performed. Dr. Donato Heinz agrees. The results were called to [facility] on 12/16/14. (JDP:ds 12/16/14) ADDENDUM: Immunohistochemistry for E-cadherin is performed and shows diffuse strong positivity consistent with invasive ductal carcinoma. Estrogen Receptor: 100%, POSITIVE, STRONG STAINING INTENSITY Progesterone Receptor: 2%, POSITIVE, STRONG STAINING INTENSITY Proliferation Marker Ki67: 27% HER-2/NEU BY CISH - NEGATIVE. RESULT RATIO OF HER2: CEP 17 SIGNALS 1.45 AVERAGE HER2 COPY NUMBER PER CELL 2.25   RADIOGRAPHIC STUDIES: I have personally reviewed the radiological images as listed and agreed with the findings in the report.  Mr Lumbar Spine Wo Contrast 10/08/2014   IMPRESSION: 1. New diffuse lumbosacral metastatic disease compatible with metastatic breast cancer. No cauda equina compression or extraosseous extension of tumor. No pathologic compression fracture. 2. 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 In this patient with LEFT hip and gluteal pain, the metastatic disease is the most likely cause however there is also potential for facet mediated referred pain or irritation of the exited LEFT L3 nerve root at L3-L4. 3. These results will be called to the ordering clinician or representative by the Radiologist Assistant, and communication documented in the PACS or zVision Dashboard.   Electronically Signed   By: Dereck Ligas M.D.   On: 10/08/2014 16:33   Bilateral mammogram and  ultrasound 12/13/2014 IMPRESSION: Findings compatible with increasing diffuse right breast malignancy with dermal lymphatic invasion. Dominant mass in the lower right right breast breast has slightly increased in size.   ASSESSMENT & PLAN:   Mrs. Wirick is a 74 year old  African-American female, who was diagnosed with node positive right breast  Invasive adenocarcinoma in  2009, untreated,  Now presents with low back pain and lumbar spine MRI is highly suspicious for bone metastasis.  1.  Right breast IDA,  Strongly ER positive,  PR weakly positive,  HER-2 positive on first biopsy , but negative on second and third  biopsy 3 and 6 years later, with diffuse bone metastatic now, T4NxM1 stage IV  - it has been over 6 years since her initial diagnosis. Her lumbar MRI is very suspicious for bone metastasis.  Her disease course is more consistent with ER positive , HER-2 negative disease. - The discussed her PET scan results with her and her daughter. 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 -I recommend bone biopsy to confirm the metastasis, and also repeat ER/PR/HER-2 to guide further therapy. Her daughter strongly encouraged her to consider biopsy but she again declined bone biopsy after long conversation. She finally agreed with a breast biopsy. -I discussed her recent breast biopsy results. Again this is a ER/PR strongly positive invasive ductal carcinoma, HER-2 is negative on this biopsy. Giving the only marginal positive HER-2 on first biopsy, and the natural history of disease, I think at this is consistent with a HER-2 negative disease. - I discussed that her disease at this stage is unlikely curable , I would not offer surgery. The goal of treatment is palliative. - Giving the bone metastasis only, I recommend endocrine therapy as a first-line treatment. I discussed aromatase inhibitors with or without palbociclib, potential side effects of each medication were  discussed with patient, which includes but not limited to, fatigue, hot flash, vaginal dryness, musculoskeletal pain and stiffness, cytopenia, risk of infection. -She finally agreed to start anastrozole, and is tolerating well. -She is not willing to try palbociclib at this point. -She is very interested in complementary therapy, I spoke with the practitioner  who offers her complementary therapy, and she'll bring a list of supplements she will take. I explained the possibility of interaction between this and anastrozole, but that she seems still want to take it regardless.   2.  Low back/ hip pain,  Secondary to metastatic cancer - I encouraged her to take pain medication Vicodin, she agrees to try.  3. Anemia -Likely secondary to her breast cancer and a bone metastasis -I'll check her iron level and the B12 ruled out nutritional anemia on next visit  -No need for transfusion. We'll follow up closely.  Plan: - continue Arimidex  -RTC in 4 weeks   All questions were answered. The patient knows to call the clinic with any problems, questions or concerns. I spent 30 minutes counseling the patient face to face. The total time spent in the appointment was 40 minutes and more than 50% was on counseling.     Truitt Merle, MD 12/27/2014

## 2014-12-27 NOTE — Telephone Encounter (Signed)
Pt confirmed labs/ov per 03/18 POF, gave pt AVS and Calendar......Marland Kitchen KJ

## 2015-01-31 ENCOUNTER — Telehealth: Payer: Self-pay | Admitting: Internal Medicine

## 2015-01-31 ENCOUNTER — Ambulatory Visit (HOSPITAL_BASED_OUTPATIENT_CLINIC_OR_DEPARTMENT_OTHER): Payer: Medicare Other | Admitting: Hematology

## 2015-01-31 ENCOUNTER — Telehealth: Payer: Self-pay | Admitting: Hematology

## 2015-01-31 ENCOUNTER — Other Ambulatory Visit (HOSPITAL_BASED_OUTPATIENT_CLINIC_OR_DEPARTMENT_OTHER): Payer: Medicare Other

## 2015-01-31 VITALS — BP 159/70 | HR 65 | Temp 98.6°F | Resp 19 | Ht 65.0 in | Wt 188.2 lb

## 2015-01-31 DIAGNOSIS — C50919 Malignant neoplasm of unspecified site of unspecified female breast: Secondary | ICD-10-CM | POA: Diagnosis not present

## 2015-01-31 DIAGNOSIS — D649 Anemia, unspecified: Secondary | ICD-10-CM

## 2015-01-31 DIAGNOSIS — C50911 Malignant neoplasm of unspecified site of right female breast: Secondary | ICD-10-CM

## 2015-01-31 DIAGNOSIS — C7951 Secondary malignant neoplasm of bone: Secondary | ICD-10-CM | POA: Diagnosis not present

## 2015-01-31 DIAGNOSIS — D519 Vitamin B12 deficiency anemia, unspecified: Secondary | ICD-10-CM

## 2015-01-31 DIAGNOSIS — E538 Deficiency of other specified B group vitamins: Secondary | ICD-10-CM

## 2015-01-31 DIAGNOSIS — Z170 Estrogen receptor positive status [ER+]: Secondary | ICD-10-CM

## 2015-01-31 LAB — COMPREHENSIVE METABOLIC PANEL (CC13)
ALBUMIN: 3.4 g/dL — AB (ref 3.5–5.0)
ALT: 9 U/L (ref 0–55)
ANION GAP: 11 meq/L (ref 3–11)
AST: 19 U/L (ref 5–34)
Alkaline Phosphatase: 85 U/L (ref 40–150)
BUN: 12.2 mg/dL (ref 7.0–26.0)
CALCIUM: 8.5 mg/dL (ref 8.4–10.4)
CHLORIDE: 106 meq/L (ref 98–109)
CO2: 24 meq/L (ref 22–29)
CREATININE: 0.6 mg/dL (ref 0.6–1.1)
EGFR: >90 mL/min/{1.73_m2} (ref >90)
GLUCOSE: 83 mg/dL (ref 70–140)
Potassium: 3.8 mEq/L (ref 3.5–5.1)
Sodium: 140 mEq/L (ref 136–145)
Total Bilirubin: 0.36 mg/dL (ref 0.20–1.20)
Total Protein: 6.4 g/dL (ref 6.4–8.3)

## 2015-01-31 LAB — CBC & DIFF AND RETIC
BASO%: 0.2 % (ref 0.0–2.0)
BASOS ABS: 0 10*3/uL (ref 0.0–0.1)
EOS ABS: 0.1 10*3/uL (ref 0.0–0.5)
EOS%: 1.4 % (ref 0.0–7.0)
HCT: 31.1 % — ABNORMAL LOW (ref 34.8–46.6)
HGB: 10.2 g/dL — ABNORMAL LOW (ref 11.6–15.9)
Immature Retic Fract: 19.2 % — ABNORMAL HIGH (ref 1.60–10.00)
LYMPH%: 25.1 % (ref 14.0–49.7)
MCH: 29.1 pg (ref 25.1–34.0)
MCHC: 32.8 g/dL (ref 31.5–36.0)
MCV: 88.9 fL (ref 79.5–101.0)
MONO#: 0.3 10*3/uL (ref 0.1–0.9)
MONO%: 8.2 % (ref 0.0–14.0)
NEUT%: 65.1 % (ref 38.4–76.8)
NEUTROS ABS: 2.7 10*3/uL (ref 1.5–6.5)
Platelets: 208 10*3/uL (ref 145–400)
RBC: 3.5 10*6/uL — ABNORMAL LOW (ref 3.70–5.45)
RDW: 13 % (ref 11.2–14.5)
RETIC %: 2.46 % — AB (ref 0.70–2.10)
RETIC CT ABS: 86.1 10*3/uL (ref 33.70–90.70)
WBC: 4.2 10*3/uL (ref 3.9–10.3)
lymph#: 1 10*3/uL (ref 0.9–3.3)

## 2015-01-31 LAB — FERRITIN CHCC: Ferritin: 216 ng/ml (ref 9–269)

## 2015-01-31 LAB — IRON AND TIBC CHCC
%SAT: 38 % (ref 21–57)
Iron: 74 ug/dL (ref 41–142)
TIBC: 194 ug/dL — AB (ref 236–444)
UIBC: 120 ug/dL (ref 120–384)

## 2015-01-31 MED ORDER — ERGOCALCIFEROL 1.25 MG (50000 UT) PO CAPS: 50000.0000 [IU] | ORAL_CAPSULE | ORAL | Status: DC

## 2015-01-31 MED ORDER — VITAMIN C 100 MG PO CHEW: 1.0000 | CHEWABLE_TABLET | Freq: Every morning | ORAL | Status: AC

## 2015-01-31 NOTE — Progress Notes (Signed)
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 Pt declined surgery or other treatment.     05/20/2011 Tumor Marker right breast mass biopsy showed IDA, ER+/PR+/HER2- (ratio 1.37)   09/04/2011 Cancer Staging PET scan showed no distant mets   10/08/2014 Progression  lumbar spine MRI showed multiple bone metastasis , highly suspicious for  metastatic disease.   12/13/2014 Pathology Results Right breast needle biopsy showed invasive ductal carcinoma, ER 100% positive, PR 2% positive, HER-2 negative with ratio 1.45 and copy number 2.25   12/14/2014 -  Anti-estrogen oral therapy Arimidex $RemoveBeforeDE'1mg'kjResZnXrYBfjKe$  daily      HISTORY OF PRESENTING ILLNESS:  Margaret Rubio 74 y.o. female  Was referred by her primary care physician to discuss the management probable metastatic breast cancer.  She was initially diagnosed with right breast cancer in November 2009. 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 In December of 2009, she underwent a right axillary lymph node biopsy on 09/24/2008 which showed metastatic ductal carcinoma. (Case No: [id number]) The tumor was ER 100% positive, PR 0% negative, Ki67 - 22%. 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 The ratio of HER-2:CEP 17 was 2.07.  She decided to pursue nature therapy by diet and exercise, and did not have surgery and other tranditional therapy for her breast cancer.   In May 2011 she underwent a mammogram that showed a 3.8 cm invasive duct carcinoma in the subareolar region of the right breast which had increased since November 2009.  The tumor was ER   99% positive,  PR 4% positive, HER-2 negative ( ratio 1.37).  She was seen by Dr. Estella Husk and Dr. Lucia Gaskins.  However, she failed multiple appointments and sought alternative medical care in [place]. This involved putting something on her right breast that caused some scarring.  She noticed the enlarged right breast mass and nipple inversion about one year ago. No pain, but some skin change. She developed left low back/buttock and hip and  pain in Nov 2015, was seen by PCP Dr. Jenny Reichmann who prescribed vicodin and tramadol and pain improved with meds. Due to the persistent pain, she underwent a lumber MRI  On 10/08/2014, which showed multiple bone metastasis.  She run out pain medication about a week ago,  But did not call for refill. She states her pain is not bad , but she appears to be uncomfortable when she stands up and walks.  She otherwise feels well overall,  Has good appetite and energy level. She lives alone and still functions well.  She comes in with her niece today.   Current therapy: Anastrozole 1 mg once daily started on 12/10/2014  INTERIM HISTORY: [name] returns for follow-up with her daughter. She has been tolerating arimidex well, slightly more fatigue, and some memory issue, mild hot flush at night, no other new symptoms. Her left hip pain has resolved since she started arimidex. She is overall doing well.    MEDICAL HISTORY:  Past Medical History  Diagnosis Date  . 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 ASTHMA 01/31/2008  . BREAST CANCER, HX OF 11/14/2009  .  BRONCHITIS, ACUTE 01/31/2008  . Cough 10/17/2009  . FATIGUE 11/14/2009  . GANGLION CYST 05/01/2009  . HYPERLIPIDEMIA 05/21/2007  . HYPERTENSION 05/05/2007  . LOW BACK PAIN 05/21/2007  . MENOPAUSAL DISORDER 05/22/2010  . Pain in joint, lower leg 05/10/2008  . RASH-NONVESICULAR 05/01/2009  . SHOULDER PAIN, LEFT 11/14/2009  . 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 VITAMIN B12 DEFICIENCY 05/23/2010  . rt breast ca dx'd 2009    right  breast    SURGICAL HISTORY: Past Surgical History  Procedure Laterality Date  . Removal of neck lump      benigh 1986  . Myomectomy      SOCIAL HISTORY: History   Social History  . Marital Status: Widowed    Spouse Name: N/A    Number of Children: 60, lives in [place].   . Years of Education: N/A   Occupational History  . Not on file.   Social History Main Topics  . Smoking status: Never Smoker   . Smokeless tobacco: Not on file  . Alcohol Use: No  . Drug Use: No  . Sexual Activity: Not on file   Other Topics Concern  . Not on file   Social History Narrative    FAMILY HISTORY: Family History  Problem Relation Age of Onset  . Asthma Other   . Diabetes Other   . Hypertension Other   . Hypertension Father   . Heart disease Father   Paternal aunt had cancer, unknown type. No other family history of malignancy   ALLERGIES:  has No Known Allergies.  MEDICATIONS:  Current Outpatient Prescriptions  Medication Sig Dispense Refill  . Ascorbic Acid (VITAMIN C) 100 MG CHEW Chew by mouth every morning.    Marland Kitchen aspirin EC 81 MG tablet Take 1 tablet (81 mg total) by mouth daily. 90 tablet 11  . ergocalciferol (VITAMIN D2) 50000 UNITS capsule Take 1 capsule (50,000 Units total) by mouth once a week. 4 capsule 3  . HYDROcodone-acetaminophen (NORCO/VICODIN) 5-325 MG per tablet Take 1 tablet by mouth every 6 (six) hours as needed for moderate pain. 60 tablet 0  . morphine (MS CONTIN) 15 MG 12 hr tablet Take 1 tablet (15 mg total) by mouth every 12 (twelve) hours. 60 tablet 0  . 0    0  . tiZANidine (ZANAFLEX) 4 MG tablet Take 1 tablet (4 mg total) by mouth every 6 (six) hours as needed for muscle spasms. 40 tablet 1   No current facility-administered medications for this visit.    REVIEW OF SYSTEMS:   Constitutional: Denies fevers, chills or abnormal night sweats Eyes: Denies blurriness of vision, double vision or watery eyes Ears, nose, mouth, throat, and face: Denies mucositis  or sore throat Respiratory: Denies cough, dyspnea or wheezes Cardiovascular: Denies palpitation, chest discomfort or lower extremity swelling Gastrointestinal:  Denies nausea, heartburn or change in bowel habits Skin: Denies abnormal skin rashes Lymphatics: Denies new lymphadenopathy or easy bruising Neurological:Denies numbness, tingling or new weaknesses Behavioral/Psych: Mood is stable, no new changes  All other systems were reviewed with the patient and are negative.  PHYSICAL EXAMINATION: ECOG PERFORMANCE STATUS: 1 - Symptomatic but completely ambulatory Weight 188 pounds, BSA 1.98, blood pressure 159/70, heart rate 65, respiratory rate 19, temperature 98.6, pulse ox 100% GENERAL:alert, no distress and comfortable SKIN: skin color, texture, turgor are normal, no rashes or significant lesions EYES: normal, conjunctiva are pink and non-injected, sclera clear OROPHARYNX:no exudate, no erythema and lips, buccal mucosa, and tongue normal  NECK: supple, thyroid normal  size, non-tender, without nodularity LYMPH:  no palpable lymphadenopathy in the cervical, axillary or inguinal LUNGS: clear to auscultation and percussion with normal breathing effort HEART: regular rate & rhythm and no murmurs and no lower extremity edema ABDOMEN:abdomen soft, non-tender and normal bowel sounds Musculoskeletal:no cyanosis of digits and no clubbing  PSYCH: alert & oriented x 3 with fluent speech NEURO: no focal motor/sensory deficits Breasts: Breast inspection showed  Significantly enlarged right breast, compared to left breast, (+) skin pigmentations , nipple inversion and a dry ulcer/ scar below the nipple, no discharge.  The entire breast is very firm , nontender , no discrete mass. Palpation of the  Left breasts and axilla revealed no obvious mass that I could appreciate.   LABORATORY DATA:  I have reviewed the data as listed Lab Results  Component Value Date   WBC 4.0 12/27/2014   HGB 9.4*  12/27/2014   HCT 29.7* 12/27/2014   MCV 90.2 12/27/2014   PLT 214 12/27/2014    Recent Labs  08/21/14 1524 10/30/14 1302 11/29/14 0805 12/27/14 0825  NA 138 138 139 138  K 3.8 4.2 3.7 4.1  CL 104  --   --   --   CO2 $Re'26 26 24 26  'nkv$ GLUCOSE 83 100 86 85  BUN 10 8.8 7.2 10.1  CREATININE 0.6 0.7 0.7 0.7  CALCIUM 9.4 8.9 9.3 8.7  PROT 7.1 6.7 6.8 6.4  ALBUMIN 3.4* 3.6 3.6 3.4*  AST 32 $Remo'28 28 25  'UkHeA$ ALT $Rem'14 9 10 13  'Qxej$ ALKPHOS 65 77 77 78  BILITOT 0.5 0.54 0.43 0.42  BILIDIR 0.1  --   --   --     CA 27.29 (Order [id number])      CA 27.29  Status: Preliminaryresult Visible to patient:  Not Released Nextappt: 03/25/2015 at 08:15 AM in Oncology ([facility]) Dx:  Breast cancer, right              Ref Range 1d ago  57mo ago  11mo ago  68mo ago     CA 27.29 0 - 39 U/mL 402 (H)P 414 (H) 384 (H) 355 (H)          PATHOLOGY REPORT  RIGHT BREAST MASS, 6 O'CLOCK, NEEDLE CORE BIOPSIES: 09/03/2008 MICROSCOPIC FOCUS OF DUCTAL CARCINOMA IN SITU ASSOCIATED WITH MICROSCOPIC FOCUS OF MICROINVASION. SEE COMMENT.  LYMPH NODE, RIGHT AXILLARY, NEEDLE CORE BIOPSIES: METASTATIC DUCTAL CARCINOMA.  09/24/2008  COMMENT There are needle core biopsies of lymph node showing partial replacement by metastatic adenocarcinoma consistent with metastasis from a breast primary, metastatic ductal carcinoma. Since the breast prognostic profile is not performed on the previous material because of limited invasive tumor ([id number]), the breast prognostic profile will be performed on the tumor in the lymph node and reported in a separate report. Interpretation HER-2/NEU BY CISH -- SHOWS AMPLIFICATION BY CISH ANALYSIS. THE RATIO OF HER-2: CEP 17 SIGNALS WAS 2.07  Diagnosis 05/20/2011 Breast, right, needle core biopsy - INVASIVE DUCTAL CARCINOMA, SEE COMMENT. - LYMPHOVASCULAR INVASION PRESENT. Estrogen Receptor (Negative, <1%): 99%, STRONG STAINING  INTENSITY Progesterone Receptor (Negative, <1%): 4%, STRONG HER-2/NEU BY CISH - NO AMPLIFICATION OF HER-2 DETECTED. THE RATIO OF HER-2: CEP 17 SIGNALS WAS 1.38.  Breast, right, needle core biopsy, 6:30 o'clock 12/13/2014 - INVASIVE MAMMARY CARCINOMA. - SEE MICROSCOPIC DESCRIPTION. Microscopic Comment The features favor Grade II invasive ductal carcinoma. A breast prognostic profile will be performed. Dr. Donato Heinz agrees. The results were called to [facility] on 12/16/14. (JDP:ds  12/16/14) ADDENDUM: Immunohistochemistry for E-cadherin is performed and shows diffuse strong positivity consistent with invasive ductal carcinoma. Estrogen Receptor: 100%, POSITIVE, STRONG STAINING INTENSITY Progesterone Receptor: 2%, POSITIVE, STRONG STAINING INTENSITY Proliferation Marker Ki67: 27% HER-2/NEU BY CISH - NEGATIVE. RESULT RATIO OF HER2: CEP 17 SIGNALS 1.45 AVERAGE HER2 COPY NUMBER PER CELL 2.25   RADIOGRAPHIC STUDIES: I have personally reviewed the radiological images as listed and agreed with the findings in the report.  Mr Lumbar Spine Wo Contrast 10/08/2014   IMPRESSION: 1. New diffuse lumbosacral metastatic disease compatible with metastatic breast cancer. No cauda equina compression or extraosseous extension of tumor. No pathologic compression fracture. 2. 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 In this patient with LEFT hip and gluteal pain, the metastatic disease is the most likely cause however there is also potential for facet mediated referred pain or irritation of the exited LEFT L3 nerve root at L3-L4. 3. These results will be called to the ordering clinician or representative by the Radiologist Assistant, and communication documented in the PACS or zVision Dashboard.   Electronically Signed   By: Dereck Ligas M.D.   On: 10/08/2014 16:33   Bilateral mammogram and ultrasound 12/13/2014 IMPRESSION: Findings compatible with increasing diffuse right breast  malignancy with dermal lymphatic invasion. Dominant mass in the lower right right breast breast has slightly increased in size.   ASSESSMENT & PLAN:   Mrs. Charter is a 74 year old  African-American female, who was diagnosed with node positive right breast  Invasive adenocarcinoma in  2009, untreated,  Now presents with low back pain and lumbar spine MRI is highly suspicious for bone metastasis.  1.  Right breast IDA,  Strongly ER positive,  PR weakly positive,  HER-2 positive on first biopsy , but negative on second and third  biopsy 3 and 6 years later, with diffuse bone metastatic now, T4NxM1 stage IV  - it has been over 6 years since her initial diagnosis. Her lumbar MRI is very suspicious for bone metastasis.  Her disease course is more consistent with ER positive , HER-2 negative disease. - I discussed her PET scan results with her and her daughter. 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 -I recommend bone biopsy to confirm the metastasis, and also repeat ER/PR/HER-2 to guide further therapy. Her daughter strongly encouraged her to consider biopsy but she again declined bone biopsy after long conversation.  -I discussed her recent breast biopsy results. Again this is a ER/PR strongly positive invasive ductal carcinoma, HER-2 is negative on this biopsy. Giving the only marginal positive HER-2 on first biopsy, and the natural history of disease, I think this is most consistent with a HER-2 negative disease. - I discussed that her disease at this stage is unlikely curable , I would not offer surgery. The goal of treatment is palliative. - Giving the bone metastasis only, I recommend endocrine therapy as a first-line treatment. I discussed aromatase inhibitors with or without palbociclib, potential side effects of each medication were discussed with patient, which includes but not limited to, fatigue, hot flash, vaginal dryness, musculoskeletal pain and stiffness,  cytopenia, risk of infection. -She finally agreed to start anastrozole, and is tolerating well. -She is not willing to try palbociclib at this point. -She is very interested in complementary therapy, I spoke with the practitioner who offers her complementary therapy, and she'll bring a list of supplements she will take. I explained the possibility of interaction between this and anastrozole, but that she seems still  want to take it regardless.  -Continue anastrozole  2.  Low back/ hip pain,  Secondary to metastatic cancer -Near resolved after she started anastrozole  3. Anemia -Likely secondary to her breast cancer and a bone metastasis -Her parents today showed low TIBC, normal serum iron and saturation, ferritin 216, B12 196 (low). This is most consistent with anemia of chronic disease (likely underlying malignancy) and B12 deficiency. - I recommend B12 injection weeklyX4, then monthly -No need for transfusion. We'll follow up closely.  Plan: - continue Arimidex  -B12 103mcg injection weeklyX4, then monthly  -RTC in 8 weeks -mail you the result   All questions were answered. The patient knows to call the clinic with any problems, questions or concerns. I spent 20 minutes counseling the patient face to face. The total time spent in the appointment was 25 minutes and more than 50% was on counseling.     Truitt Merle, MD 01/31/2015

## 2015-01-31 NOTE — Telephone Encounter (Signed)
Refill sent to [facility].../lmb 

## 2015-01-31 NOTE — Telephone Encounter (Signed)
Gave avs & calendar for June. °

## 2015-01-31 NOTE — Telephone Encounter (Signed)
Patient needs refills for ergocalciferol (VITAMIN D2) 50000 UNITS capsule [[id number] and Ascorbic Acid (VITAMIN C) 100 MG CHEW [[id number]. Cancer center is requesting this. Pharmacy is [facility] [address]

## 2015-02-01 ENCOUNTER — Encounter: Payer: Self-pay | Admitting: Hematology

## 2015-02-02 LAB — CANCER ANTIGEN 27.29: CA 27.29: 402 U/mL — AB (ref 0–39)

## 2015-02-02 LAB — FOLATE RBC: RBC FOLATE: 801 ng/mL (ref >280)

## 2015-02-02 LAB — VITAMIN B12: Vitamin B-12: 196 pg/mL — ABNORMAL LOW (ref 211–911)

## 2015-02-03 ENCOUNTER — Telehealth: Payer: Self-pay | Admitting: *Deleted

## 2015-02-03 NOTE — Telephone Encounter (Signed)
Called pt at home and left message on voice mail requesting a call back from pt for further instructions on Low B12 level.  Pt will need injection weekly x 4 , then monthly.

## 2015-02-05 ENCOUNTER — Telehealth: Payer: Self-pay | Admitting: *Deleted

## 2015-02-05 NOTE — Telephone Encounter (Signed)
Called niece Meimei and left message on voice mail requesting a call back for further instructions from md.  Called pt again at home and left message again requesting a call back from pt for further instructions on Low B12 level.

## 2015-02-07 ENCOUNTER — Other Ambulatory Visit: Payer: Self-pay | Admitting: *Deleted

## 2015-02-07 ENCOUNTER — Other Ambulatory Visit: Payer: Self-pay | Admitting: Hematology

## 2015-02-07 ENCOUNTER — Telehealth: Payer: Self-pay | Admitting: Hematology

## 2015-02-07 ENCOUNTER — Telehealth: Payer: Self-pay | Admitting: *Deleted

## 2015-02-07 NOTE — Telephone Encounter (Signed)
Pt returned call to nurse.   Informed pt that B12 level low ; Dr. Burr Medico would like for pt to receive B12 injection weekly x 4 , then monthly.  Pt agreed and wanted to know when she could have the injections.  Informed pt that a scheduler will contact pt with appts date and time.  Pt voiced understanding.

## 2015-02-07 NOTE — Telephone Encounter (Signed)
Left message to confirm Inj appointments. Mailed calendar.

## 2015-02-10 ENCOUNTER — Telehealth: Payer: Self-pay | Admitting: Hematology

## 2015-02-10 NOTE — Telephone Encounter (Signed)
pt cld and left vm wanting to know when her inj was-cld & left pt a message and adv of  time & date of next inj

## 2015-02-11 ENCOUNTER — Telehealth: Payer: Self-pay | Admitting: *Deleted

## 2015-02-11 NOTE — Telephone Encounter (Signed)
Pt called wanting to know when she is scheduled for B-12 injections. Confirmed appts beginning 5/6.

## 2015-02-14 ENCOUNTER — Ambulatory Visit (HOSPITAL_BASED_OUTPATIENT_CLINIC_OR_DEPARTMENT_OTHER): Payer: Medicare Other

## 2015-02-14 VITALS — BP 165/72 | HR 66 | Temp 97.8°F

## 2015-02-14 DIAGNOSIS — D519 Vitamin B12 deficiency anemia, unspecified: Secondary | ICD-10-CM

## 2015-02-14 DIAGNOSIS — E538 Deficiency of other specified B group vitamins: Secondary | ICD-10-CM

## 2015-02-14 MED ORDER — CYANOCOBALAMIN 1000 MCG/ML IJ SOLN
1000.0000 ug | Freq: Once | INTRAMUSCULAR | Status: AC
  Administered 2015-02-14: 1000 ug via INTRAMUSCULAR

## 2015-02-14 NOTE — Patient Instructions (Signed)

## 2015-02-21 ENCOUNTER — Ambulatory Visit: Payer: Medicare Other

## 2015-02-21 ENCOUNTER — Ambulatory Visit (HOSPITAL_BASED_OUTPATIENT_CLINIC_OR_DEPARTMENT_OTHER): Payer: Medicare Other

## 2015-02-21 VITALS — BP 134/60 | HR 71 | Temp 98.2°F

## 2015-02-21 DIAGNOSIS — D519 Vitamin B12 deficiency anemia, unspecified: Secondary | ICD-10-CM

## 2015-02-21 DIAGNOSIS — E538 Deficiency of other specified B group vitamins: Secondary | ICD-10-CM

## 2015-02-21 MED ORDER — CYANOCOBALAMIN 1000 MCG/ML IJ SOLN
1000.0000 ug | Freq: Once | INTRAMUSCULAR | Status: AC
  Administered 2015-02-21: 1000 ug via INTRAMUSCULAR

## 2015-02-21 NOTE — Patient Instructions (Signed)

## 2015-02-28 ENCOUNTER — Ambulatory Visit: Payer: Medicare Other

## 2015-02-28 ENCOUNTER — Ambulatory Visit (HOSPITAL_BASED_OUTPATIENT_CLINIC_OR_DEPARTMENT_OTHER): Payer: Medicare Other

## 2015-02-28 VITALS — BP 161/66 | HR 59 | Temp 98.4°F

## 2015-02-28 DIAGNOSIS — D519 Vitamin B12 deficiency anemia, unspecified: Secondary | ICD-10-CM | POA: Diagnosis not present

## 2015-02-28 DIAGNOSIS — E538 Deficiency of other specified B group vitamins: Secondary | ICD-10-CM

## 2015-02-28 MED ORDER — CYANOCOBALAMIN 1000 MCG/ML IJ SOLN
1000.0000 ug | Freq: Once | INTRAMUSCULAR | Status: AC
  Administered 2015-02-28: 1000 ug via INTRAMUSCULAR

## 2015-03-07 ENCOUNTER — Ambulatory Visit (HOSPITAL_BASED_OUTPATIENT_CLINIC_OR_DEPARTMENT_OTHER): Payer: Medicare Other

## 2015-03-07 ENCOUNTER — Ambulatory Visit: Payer: Medicare Other

## 2015-03-07 VITALS — BP 134/58 | HR 68 | Temp 98.3°F

## 2015-03-07 DIAGNOSIS — E538 Deficiency of other specified B group vitamins: Secondary | ICD-10-CM

## 2015-03-07 DIAGNOSIS — D519 Vitamin B12 deficiency anemia, unspecified: Secondary | ICD-10-CM | POA: Diagnosis not present

## 2015-03-07 MED ORDER — CYANOCOBALAMIN 1000 MCG/ML IJ SOLN
1000.0000 ug | Freq: Once | INTRAMUSCULAR | Status: AC
  Administered 2015-03-07: 1000 ug via INTRAMUSCULAR

## 2015-03-08 ENCOUNTER — Ambulatory Visit: Payer: Medicare Other

## 2015-03-25 ENCOUNTER — Ambulatory Visit (HOSPITAL_BASED_OUTPATIENT_CLINIC_OR_DEPARTMENT_OTHER): Payer: Medicare Other

## 2015-03-25 ENCOUNTER — Other Ambulatory Visit (HOSPITAL_BASED_OUTPATIENT_CLINIC_OR_DEPARTMENT_OTHER): Payer: Medicare Other

## 2015-03-25 ENCOUNTER — Encounter: Payer: Self-pay | Admitting: Hematology

## 2015-03-25 ENCOUNTER — Ambulatory Visit (HOSPITAL_BASED_OUTPATIENT_CLINIC_OR_DEPARTMENT_OTHER): Payer: Medicare Other | Admitting: Hematology

## 2015-03-25 ENCOUNTER — Telehealth: Payer: Self-pay | Admitting: Hematology

## 2015-03-25 VITALS — BP 154/67 | HR 66 | Temp 97.7°F | Resp 18 | Ht 65.0 in | Wt 184.2 lb

## 2015-03-25 DIAGNOSIS — C50919 Malignant neoplasm of unspecified site of unspecified female breast: Secondary | ICD-10-CM | POA: Diagnosis not present

## 2015-03-25 DIAGNOSIS — D649 Anemia, unspecified: Secondary | ICD-10-CM

## 2015-03-25 DIAGNOSIS — C50911 Malignant neoplasm of unspecified site of right female breast: Secondary | ICD-10-CM | POA: Diagnosis not present

## 2015-03-25 DIAGNOSIS — E538 Deficiency of other specified B group vitamins: Secondary | ICD-10-CM

## 2015-03-25 DIAGNOSIS — C7951 Secondary malignant neoplasm of bone: Secondary | ICD-10-CM

## 2015-03-25 DIAGNOSIS — Z170 Estrogen receptor positive status [ER+]: Secondary | ICD-10-CM

## 2015-03-25 DIAGNOSIS — Z79811 Long term (current) use of aromatase inhibitors: Secondary | ICD-10-CM

## 2015-03-25 LAB — COMPREHENSIVE METABOLIC PANEL (CC13)
ALBUMIN: 3.5 g/dL (ref 3.5–5.0)
ALT: 32 U/L (ref 0–55)
ANION GAP: 8 meq/L (ref 3–11)
AST: 42 U/L — ABNORMAL HIGH (ref 5–34)
Alkaline Phosphatase: 67 U/L (ref 40–150)
BUN: 8.4 mg/dL (ref 7.0–26.0)
CHLORIDE: 107 meq/L (ref 98–109)
CO2: 25 meq/L (ref 22–29)
CREATININE: 0.7 mg/dL (ref 0.6–1.1)
Calcium: 9.1 mg/dL (ref 8.4–10.4)
EGFR: >90 mL/min/{1.73_m2} (ref >90)
Glucose: 83 mg/dl (ref 70–140)
Potassium: 4.1 mEq/L (ref 3.5–5.1)
Sodium: 140 mEq/L (ref 136–145)
Total Bilirubin: 0.43 mg/dL (ref 0.20–1.20)
Total Protein: 6.6 g/dL (ref 6.4–8.3)

## 2015-03-25 LAB — CBC WITH DIFFERENTIAL/PLATELET
BASO%: 0.2 % (ref 0.0–2.0)
Basophils Absolute: 0 10*3/uL (ref 0.0–0.1)
EOS ABS: 0 10*3/uL (ref 0.0–0.5)
EOS%: 0.9 % (ref 0.0–7.0)
HEMATOCRIT: 33.3 % — AB (ref 34.8–46.6)
HGB: 10.8 g/dL — ABNORMAL LOW (ref 11.6–15.9)
LYMPH%: 24.6 % (ref 14.0–49.7)
MCH: 28.9 pg (ref 25.1–34.0)
MCHC: 32.4 g/dL (ref 31.5–36.0)
MCV: 89 fL (ref 79.5–101.0)
MONO#: 0.4 10*3/uL (ref 0.1–0.9)
MONO%: 8 % (ref 0.0–14.0)
NEUT#: 3.1 10*3/uL (ref 1.5–6.5)
NEUT%: 66.3 % (ref 38.4–76.8)
Platelets: 214 10*3/uL (ref 145–400)
RBC: 3.74 10*6/uL (ref 3.70–5.45)
RDW: 12.5 % (ref 11.2–14.5)
WBC: 4.6 10*3/uL (ref 3.9–10.3)
lymph#: 1.1 10*3/uL (ref 0.9–3.3)

## 2015-03-25 LAB — CANCER ANTIGEN 27.29: CA 27.29: 317 U/mL — AB (ref 0–39)

## 2015-03-25 MED ORDER — ANASTROZOLE 1 MG PO TABS: 1.0000 mg | ORAL_TABLET | Freq: Every day | ORAL | Status: DC

## 2015-03-25 MED ORDER — CYANOCOBALAMIN 1000 MCG/ML IJ SOLN
1000.0000 ug | Freq: Once | INTRAMUSCULAR | Status: AC
  Administered 2015-03-25: 1000 ug via INTRAMUSCULAR

## 2015-03-25 NOTE — Progress Notes (Signed)
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 Pt declined surgery or other treatment.     05/20/2011 Tumor Marker right breast mass biopsy showed IDA, ER+/PR+/HER2- (ratio 1.37)   09/04/2011 Cancer Staging PET scan showed no distant mets   10/08/2014 Progression  lumbar spine MRI showed multiple bone metastasis , highly suspicious for  metastatic disease.   12/13/2014 Pathology Results Right breast needle biopsy showed invasive ductal carcinoma, ER 100% positive, PR 2% positive, HER-2 negative with ratio 1.45 and copy number 2.25   12/14/2014 -  Anti-estrogen oral therapy Arimidex 78m daily      HISTORY OF PRESENTING ILLNESS:  Margaret BOULET89y.o. female  Was referred by her primary care physician to discuss the management probable metastatic breast cancer.  She was initially diagnosed with right breast cancer in November 2009. 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 In December of 2009, she underwent a right axillary lymph node biopsy on 09/24/2008 which showed metastatic ductal carcinoma. (Case No: [id number] The tumor was ER 100% positive, PR 0% negative, Ki67 - 22%. 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 The ratio of HER-2:CEP 17 was 2.07.  She decided to pursue nature therapy by diet and exercise, and did not have surgery and other tranditional therapy for her breast cancer.   In May 2011 she underwent a mammogram that showed a 3.8 cm invasive duct carcinoma in the subareolar region of the right breast which had increased since November 2009.  The tumor was ER   99% positive,  PR 4% positive, HER-2 negative ( ratio 1.37).  She was seen by Dr. EEstella Huskand Dr. NLucia Gaskins  However, she failed multiple appointments and sought alternative medical care in [place] This involved putting something on her right breast that caused some scarring.  She noticed the enlarged right breast mass and nipple inversion about one year ago. No pain, but some skin change. She developed left low back/buttock and hip and  pain in Nov 2015, was seen by PCP Dr. JJenny Reichmannwho prescribed vicodin and tramadol and pain improved with meds. Due to the persistent pain, she underwent a lumber MRI  On 10/08/2014, which showed multiple bone metastasis.  She run out pain medication about a week ago,  But did not call for refill. She states her pain is not bad , but she appears to be uncomfortable when she stands up and walks.  She otherwise feels well overall,  Has good appetite and energy level. She lives alone and still functions well.  She comes in with her niece today.   Current therapy: Anastrozole 1 mg once daily started on 12/10/2014  INTERIM HISTORY: Ms. AMelderreturns for follow-up with her daughter. She has been tolerating arimidex well, stable mild fatigue, and some memory issue. She has mild hot flush at night, no other new symptoms. Her left hip pain has resolved since she started arimidex. She is overall doing well. She Route anastrozole a few days ago.   MEDICAL HISTORY:  Past Medical History  Diagnosis Date  . 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 ASTHMA  01/31/2008  . BREAST CANCER, HX OF 11/14/2009  . BRONCHITIS, ACUTE 01/31/2008  . Cough 10/17/2009  . FATIGUE 11/14/2009  . GANGLION CYST 05/01/2009  . HYPERLIPIDEMIA 05/21/2007  . HYPERTENSION 05/05/2007  . LOW BACK PAIN 05/21/2007  . MENOPAUSAL DISORDER 05/22/2010  . Pain in joint, lower leg 05/10/2008  . RASH-NONVESICULAR 05/01/2009  . SHOULDER PAIN, LEFT 11/14/2009  . 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 VITAMIN B12 DEFICIENCY  05/23/2010  . rt breast ca dx'd 2009    right breast    SURGICAL HISTORY: Past Surgical History  Procedure Laterality Date  . Removal of neck lump      benigh 1986  . Myomectomy      SOCIAL HISTORY: History   Social History  . Marital Status: Widowed    Spouse Name: N/A    Number of Children: 36, lives in [place].   . Years of Education: N/A   Occupational History  . Not on file.   Social History Main Topics  . Smoking status: Never Smoker   . Smokeless tobacco: Not on file  . Alcohol Use: No  . Drug Use: No  . Sexual Activity: Not on file   Other Topics Concern  . Not on file   Social History Narrative    FAMILY HISTORY: Family History  Problem Relation Age of Onset  . Asthma Other   . Diabetes Other   . Hypertension Other   . Hypertension Father   . Heart disease Father   Paternal aunt had cancer, unknown type. No other family history of malignancy   ALLERGIES:  has No Known Allergies.  MEDICATIONS:  Current Outpatient Prescriptions  Medication Sig Dispense Refill  . Ascorbic Acid (VITAMIN C) 100 MG CHEW Chew by mouth every morning.    Marland Kitchen aspirin EC 81 MG tablet Take 1 tablet (81 mg total) by mouth daily. 90 tablet 11  . ergocalciferol (VITAMIN D2) 50000 UNITS capsule Take 1 capsule (50,000 Units total) by mouth once a week. 4 capsule 3  . HYDROcodone-acetaminophen (NORCO/VICODIN) 5-325 MG per tablet Take 1 tablet by mouth every 6 (six) hours as needed for moderate pain. 60 tablet 0  . morphine (MS CONTIN) 15 MG 12 hr tablet Take 1 tablet (15 mg total) by mouth every 12 (twelve) hours. 60 tablet 0  . 0    0  . tiZANidine (ZANAFLEX) 4 MG tablet Take 1 tablet (4 mg total) by mouth every 6 (six) hours as needed for muscle spasms. 40 tablet 1   No current facility-administered medications for this visit.    REVIEW OF SYSTEMS:   Constitutional: Denies fevers, chills or abnormal night sweats Eyes: Denies blurriness of vision, double vision or watery eyes Ears,  nose, mouth, throat, and face: Denies mucositis or sore throat Respiratory: Denies cough, dyspnea or wheezes Cardiovascular: Denies palpitation, chest discomfort or lower extremity swelling Gastrointestinal:  Denies nausea, heartburn or change in bowel habits Skin: Denies abnormal skin rashes Lymphatics: Denies new lymphadenopathy or easy bruising Neurological:Denies numbness, tingling or new weaknesses Behavioral/Psych: Mood is stable, no new changes  All other systems were reviewed with the patient and are negative.  PHYSICAL EXAMINATION: ECOG PERFORMANCE STATUS: 1 - Symptomatic but completely ambulatory Weight 188 pounds, BSA 1.98, blood pressure 159/70, heart rate 65, respiratory rate 19, temperature 98.6, pulse ox 100% GENERAL:alert, no distress and comfortable SKIN: skin color, texture, turgor are normal, no rashes or significant lesions EYES: normal, conjunctiva are pink and non-injected, sclera clear OROPHARYNX:no exudate, no erythema and lips,  buccal mucosa, and tongue normal  NECK: supple, thyroid normal size, non-tender, without nodularity LYMPH:  no palpable lymphadenopathy in the cervical, axillary or inguinal LUNGS: clear to auscultation and percussion with normal breathing effort HEART: regular rate & rhythm and no murmurs and no lower extremity edema ABDOMEN:abdomen soft, non-tender and normal bowel sounds Musculoskeletal:no cyanosis of digits and no clubbing  PSYCH: alert & oriented x 3 with fluent speech NEURO: no focal motor/sensory deficits Breasts: Breast inspection showed  Significantly enlarged right breast, compared to left breast, (+) skin pigmentations , nipple inversion and a dry ulcer/ scar below the nipple, no discharge.  The entire breast is very firm , nontender , no discrete mass. Palpation of the  Left breasts and axilla revealed no obvious mass that I could appreciate.   LABORATORY DATA:  I have reviewed the data as listed CBC Latest Ref Rng 03/25/2015  01/31/2015 12/27/2014  WBC 3.9 - 10.3 10e3/uL 4.6 4.2 4.0  Hemoglobin 11.6 - 15.9 g/dL 10.8(L) 10.2(L) 9.4(L)  Hematocrit 34.8 - 46.6 % 33.3(L) 31.1(L) 29.7(L)  Platelets 145 - 400 10e3/uL 214 208 214    CMP Latest Ref Rng 03/25/2015 01/31/2015 12/27/2014  Glucose 70 - 140 mg/dl 83 83 85  BUN 7.0 - 26.0 mg/dL 8.4 12.2 10.1  Creatinine 0.6 - 1.1 mg/dL 0.7 0.6 0.7  Sodium 136 - 145 mEq/L 140 140 138  Potassium 3.5 - 5.1 mEq/L 4.1 3.8 4.1  Chloride 96 - 112 mEq/L - - -  CO2 22 - 29 mEq/L _0 Calcium 8.4 - 10.4 mg/dL 9.1 8.5 8.7  Total Protein 6.4 - 8.3 g/dL 6.6 6.4 6.4  Total Bilirubin 0.20 - 1.20 mg/dL 0.43 0.36 0.42  Alkaline Phos 40 - 150 U/L 67 85 78  AST 5 - 34 U/L 42(H) 19 25  ALT 0 - 55 U/L 32 9 13      CA 27.29 (Order [id number])      CA 27.29  Status: Preliminaryresult Visible to patient:  Not Released Nextappt: 03/25/2015 at 08:15 AM in Oncology ([facility]) Dx:  Breast cancer, right              Ref Range 1d ago  32moago  2109mogo  42m19moo     CA 27.29 0 - 39 U/mL 402 (H)P 414 (H) 384 (H) 355 (H)        Vitamin B12  Status: Finalresult Visible to patient:  Not Released Nextappt: 04/22/2015 at 09:00 AM in Oncology ([facility]) Dx:  Breast cancer, right              Ref Range 76mo5mo  69mo 669mo 3yr a47yr   Vitamin B-12 211 - 911 pg/mL 196 (L) 352 334          PATHOLOGY REPORT  RIGHT BREAST MASS, 6 O'CLOCK, NEEDLE CORE BIOPSIES: 09/03/2008 MICROSCOPIC FOCUS OF DUCTAL CARCINOMA IN SITU ASSOCIATED WITH MICROSCOPIC FOCUS OF MICROINVASION. SEE COMMENT.  LYMPH NODE, RIGHT AXILLARY, NEEDLE CORE BIOPSIES: METASTATIC DUCTAL CARCINOMA.  09/24/2008  COMMENT There are needle core biopsies of lymph node showing partial replacement by metastatic adenocarcinoma consistent with metastasis from a breast primary, metastatic ductal carcinoma. Since the breast prognostic profile is not  performed on the previous material because of limited invasive tumor ([id number] breast prognostic profile will be performed on the tumor in the lymph node and reported in a separate report. Interpretation HER-2/NEU BY CISH -- SHOWS AMPLIFICATION BY CISH ANALYSIS. THE  RATIO OF HER-2: CEP 17 SIGNALS WAS 2.07  Diagnosis 05/20/2011 Breast, right, needle core biopsy - INVASIVE DUCTAL CARCINOMA, SEE COMMENT. - LYMPHOVASCULAR INVASION PRESENT. Estrogen Receptor (Negative, <1%): 99%, STRONG STAINING INTENSITY Progesterone Receptor (Negative, <1%): 4%, STRONG HER-2/NEU BY CISH - NO AMPLIFICATION OF HER-2 DETECTED. THE RATIO OF HER-2: CEP 17 SIGNALS WAS 1.38.  Breast, right, needle core biopsy, 6:30 o'clock 12/13/2014 - INVASIVE MAMMARY CARCINOMA. - SEE MICROSCOPIC DESCRIPTION. Microscopic Comment The features favor Grade II invasive ductal carcinoma. A breast prognostic profile will be performed. Dr. Donato Heinz agrees. The results were called to [facility] on 12/16/14. (JDP:ds 12/16/14) ADDENDUM: Immunohistochemistry for E-cadherin is performed and shows diffuse strong positivity consistent with invasive ductal carcinoma. Estrogen Receptor: 100%, POSITIVE, STRONG STAINING INTENSITY Progesterone Receptor: 2%, POSITIVE, STRONG STAINING INTENSITY Proliferation Marker Ki67: 27% HER-2/NEU BY CISH - NEGATIVE. RESULT RATIO OF HER2: CEP 17 SIGNALS 1.45 AVERAGE HER2 COPY NUMBER PER CELL 2.25   RADIOGRAPHIC STUDIES: I have personally reviewed the radiological images as listed and agreed with the findings in the report.  Mr Lumbar Spine Wo Contrast 10/08/2014   IMPRESSION: 1. New diffuse lumbosacral metastatic disease compatible with metastatic breast cancer. No cauda equina compression or extraosseous extension of tumor. No pathologic compression fracture. 2. 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 In this patient with LEFT hip and gluteal pain, the metastatic  disease is the most likely cause however there is also potential for facet mediated referred pain or irritation of the exited LEFT L3 nerve root at L3-L4. 3. These results will be called to the ordering clinician or representative by the Radiologist Assistant, and communication documented in the PACS or zVision Dashboard.   Electronically Signed   By: Dereck Ligas M.D.   On: 10/08/2014 16:33   Bilateral mammogram and ultrasound 12/13/2014 IMPRESSION: Findings compatible with increasing diffuse right breast malignancy with dermal lymphatic invasion. Dominant mass in the lower right right breast breast has slightly increased in size.   ASSESSMENT & PLAN:   Margaret Rubio is a 74 year old  African-American female, who was diagnosed with node positive right breast  Invasive adenocarcinoma in  2009, untreated,  Now presents with low back pain and lumbar spine MRI is highly suspicious for bone metastasis.  1.  Right breast IDA,  Strongly ER positive,  PR weakly positive,  HER-2 positive on first biopsy , but negative on second and third  biopsy 3 and 6 years later, with diffuse bone metastatic now, T4NxM1 stage IV  - it has been over 6 years since her initial diagnosis. Her lumbar MRI is very suspicious for bone metastasis.  Her disease course is more consistent with ER positive , HER-2 negative disease. - I discussed her PET scan results with her and her daughter. 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 -I recommend bone biopsy to confirm the metastasis, and also repeat ER/PR/HER-2 to guide further therapy. Her daughter strongly encouraged her to consider biopsy but she again declined bone biopsy after long conversation.  -I discussed her recent breast biopsy results. Again this is a ER/PR strongly positive invasive ductal carcinoma, HER-2 is negative on this biopsy. Giving the only marginal positive HER-2 on first biopsy, and the natural history of disease, I think this is  most consistent with a HER-2 negative disease. - I discussed that her disease at this stage is unlikely curable , I would not offer surgery. The goal of treatment is palliative. - Giving the bone metastasis  only, I recommend endocrine therapy as a first-line treatment. I discussed aromatase inhibitors with or without palbociclib, potential side effects of each medication were discussed with patient, which includes but not limited to, fatigue, hot flash, vaginal dryness, musculoskeletal pain and stiffness, cytopenia, risk of infection. -She finally agreed to start anastrozole, and is tolerating well, will continue. -She is not willing to try palbociclib at this point. -She is very interested in complementary therapy, I spoke with the practitioner who offers her complementary therapy.  I explained the possibility of interaction between this and anastrozole, but that she seems still want to take it regardless.  -Continue anastrozole  2.  Low back/ hip pain,  Secondary to metastatic cancer -Near resolved after she started anastrozole -We'll start on Xgeva for her bone metastasis. Benefit and side effects, especially jaw necrosis, we'll discuss with patient. She has a tooth these to be fixed, I recommend her to get a dental appointment as soon as possible. We'll start Xgeva in 2 months.  3. Anemia -Likely secondary to her breast cancer, bone metastases and the B12 deficiency -Slightly improved after weekly B12 injection 4 months. Continue B12 monthly for now -No need for transfusion. We'll follow up closely.  Plan: - continue Arimidex, I refilled for her today -B12 1018mg injection monthly  -Start Xgeva in 2 months, then monthly -Restaging PET scan in 3 months -RTC in 3 months.  All questions were answered. The patient knows to call the clinic with any problems, questions or concerns. I spent 20 minutes counseling the patient face to face. The total time spent in the appointment was 25 minutes  and more than 50% was on counseling.     FTruitt Merle MD 03/25/2015

## 2015-03-25 NOTE — Telephone Encounter (Signed)
Gave and printed appt sched and avs for pt for JUly thru Sept pt going to be out of town from Aug 30 thru the end of Sept

## 2015-04-22 ENCOUNTER — Telehealth: Payer: Self-pay | Admitting: *Deleted

## 2015-04-22 ENCOUNTER — Ambulatory Visit: Payer: Medicare Other

## 2015-04-22 NOTE — Telephone Encounter (Signed)
Attempted to contact patient,  Numbers not correct.  Spoke to son who told me to call daughter to get hold of patient.  Left message to call back.

## 2015-04-23 ENCOUNTER — Emergency Department (HOSPITAL_COMMUNITY)
Admission: EM | Admit: 2015-04-23 | Discharge: 2015-04-23 | Disposition: A | Discharge status: Home / Self Care | Payer: Medicare Other | Attending: Emergency Medicine | Admitting: Emergency Medicine

## 2015-04-23 ENCOUNTER — Encounter (HOSPITAL_COMMUNITY): Payer: Self-pay

## 2015-04-23 ENCOUNTER — Emergency Department (HOSPITAL_COMMUNITY): Payer: Medicare Other

## 2015-04-23 DIAGNOSIS — Y9289 Other specified places as the place of occurrence of the external cause: Secondary | ICD-10-CM | POA: Diagnosis not present

## 2015-04-23 DIAGNOSIS — I10 Essential (primary) hypertension: Secondary | ICD-10-CM | POA: Insufficient documentation

## 2015-04-23 DIAGNOSIS — S60221A Contusion of right hand, initial encounter: Secondary | ICD-10-CM

## 2015-04-23 DIAGNOSIS — Y9389 Activity, other specified: Secondary | ICD-10-CM | POA: Diagnosis not present

## 2015-04-23 DIAGNOSIS — J45909 Unspecified asthma, uncomplicated: Secondary | ICD-10-CM | POA: Insufficient documentation

## 2015-04-23 DIAGNOSIS — Z8639 Personal history of other endocrine, nutritional and metabolic disease: Secondary | ICD-10-CM | POA: Insufficient documentation

## 2015-04-23 DIAGNOSIS — Z853 Personal history of malignant neoplasm of breast: Secondary | ICD-10-CM | POA: Diagnosis not present

## 2015-04-23 DIAGNOSIS — Z8742 Personal history of other diseases of the female genital tract: Secondary | ICD-10-CM | POA: Diagnosis not present

## 2015-04-23 DIAGNOSIS — M79641 Pain in right hand: Secondary | ICD-10-CM | POA: Diagnosis not present

## 2015-04-23 DIAGNOSIS — Z862 Personal history of diseases of the blood and blood-forming organs and certain disorders involving the immune mechanism: Secondary | ICD-10-CM | POA: Insufficient documentation

## 2015-04-23 DIAGNOSIS — W228XXA Striking against or struck by other objects, initial encounter: Secondary | ICD-10-CM | POA: Insufficient documentation

## 2015-04-23 DIAGNOSIS — Z8739 Personal history of other diseases of the musculoskeletal system and connective tissue: Secondary | ICD-10-CM | POA: Diagnosis not present

## 2015-04-23 DIAGNOSIS — Z79899 Other long term (current) drug therapy: Secondary | ICD-10-CM | POA: Diagnosis not present

## 2015-04-23 DIAGNOSIS — M7989 Other specified soft tissue disorders: Secondary | ICD-10-CM | POA: Diagnosis not present

## 2015-04-23 DIAGNOSIS — Y998 Other external cause status: Secondary | ICD-10-CM | POA: Diagnosis not present

## 2015-04-23 DIAGNOSIS — S6991XA Unspecified injury of right wrist, hand and finger(s), initial encounter: Secondary | ICD-10-CM | POA: Diagnosis not present

## 2015-04-23 MED ORDER — HYDROCODONE-ACETAMINOPHEN 5-325 MG PO TABS: 1.0000 | ORAL_TABLET | Freq: Four times a day (QID) | ORAL | Status: DC | PRN

## 2015-04-23 NOTE — ED Provider Notes (Signed)
CSN: [id number]     Arrival date & time 04/23/15  1832 History   This chart was scribed for [employer], PA-C working with Jola Schmidt, MD by Mercy Moore, ED Scribe. This patient was seen in room WTR6/WTR6 and the patient's care was started at 6:53 PM.    Chief Complaint  Patient presents with  . Hand Pain   Patient is a 74 y.o. female presenting with hand pain. The history is provided by the patient. No language interpreter was used.  Hand Pain This is a new problem. The current episode started yesterday. The problem occurs constantly. The problem has been gradually worsening. Pertinent negatives include no chest pain, no abdominal pain and no shortness of breath. Exacerbated by: movement. The symptoms are relieved by ice and acetaminophen. She has tried acetaminophen and a cold compress for the symptoms. The treatment provided mild relief.   HPI Comments: LADORA OSTERBERG is a 74 y.o. female who presents to the Emergency Department with right dorsal hand injury sustained when she hit her hand on her bed yesterday. Since the incident, patient reports worsening, intermittent, aching pain and bruising to site rated at 6/10. Aggravating factors include movement. Patient reports treatment at home with ice and Tylenol, which did mildly relieve her swelling and pain. Patient endorses maintained movement, but reports reduced grip strength due to pain severity. Still able to wiggle all digits. Patient denies fever, chills, cough, shortness of breath, leg swelling, chest pain, abdominal pain, nausea, vomiting, diarrhea, numbness, tingling, or weakness. Patient denies history of gout.    Past Medical History  Diagnosis Date  . 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 ASTHMA 01/31/2008  . BREAST CANCER, HX OF 11/14/2009  . BRONCHITIS, ACUTE 01/31/2008  . Cough 10/17/2009  . FATIGUE 11/14/2009  . GANGLION CYST 05/01/2009  . HYPERLIPIDEMIA 05/21/2007  . HYPERTENSION 05/05/2007  . LOW BACK PAIN 05/21/2007   . MENOPAUSAL DISORDER 05/22/2010  . Pain in joint, lower leg 05/10/2008  . RASH-NONVESICULAR 05/01/2009  . SHOULDER PAIN, LEFT 11/14/2009  . 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 VITAMIN B12 DEFICIENCY 05/23/2010  . rt breast ca dx'd 2009    right breast   Past Surgical History  Procedure Laterality Date  . Removal of neck lump      benigh 1986  . Myomectomy     Family History  Problem Relation Age of Onset  . Asthma Other   . Diabetes Other   . Hypertension Other   . Hypertension Father   . Heart disease Father    History  Substance Use Topics  . Smoking status: Never Smoker   . Smokeless tobacco: Not on file  . Alcohol Use: No   OB History    No data available     Review of Systems  Constitutional: Negative for fever and chills.  Respiratory: Negative for shortness of breath.   Cardiovascular: Negative for chest pain and leg swelling.  Gastrointestinal: Negative for nausea, vomiting and abdominal pain.  Musculoskeletal: Positive for joint swelling (R hand, improved) and arthralgias (R hand). Negative for myalgias.  Skin: Positive for color change (bruise). Negative for wound.  Neurological: Negative for weakness and numbness.  Hematological: Does not bruise/bleed easily.   A complete 10 system review of systems was obtained and all systems are negative except as noted in the HPI and PMH.    Allergies  Review of patient's allergies indicates no known allergies.  Home Medications   Prior to Admission medications   Medication Sig Start Date  End Date Taking? Authorizing Provider  anastrozole (ARIMIDEX) 1 MG tablet Take 1 tablet (1 mg total) by mouth daily. 03/25/15   Truitt Merle, MD  Ascorbic Acid (VITAMIN C) 100 MG CHEW Chew 1 tablet (100 mg total) by mouth every morning. 01/31/15   [name], MD  ergocalciferol (VITAMIN D2) 50000 UNITS capsule Take 1 capsule (50,000 Units total) by mouth once a week. 01/31/15   [name], MD  HYDROcodone-acetaminophen (NORCO/VICODIN)  5-325 MG per tablet Take 1 tablet by mouth every 6 (six) hours as needed for moderate pain. Patient not taking: Reported on 01/31/2015 10/30/14   Truitt Merle, MD   Triage Vitals: BP 141/55 mmHg  Pulse 80  Temp(Src) 99 F (37.2 C) (Oral)  Resp 18  SpO2 99% Physical Exam  Constitutional: She is oriented to person, place, and time. Vital signs are normal. She appears well-developed and well-nourished.  Non-toxic appearance. No distress.  Afebrile, nontoxic, NAD  HENT:  Head: Normocephalic and atraumatic.  Mouth/Throat: Mucous membranes are normal.  Eyes: Conjunctivae and EOM are normal. Right eye exhibits no discharge. Left eye exhibits no discharge.  Neck: Normal range of motion. Neck supple.  Cardiovascular: Normal rate and intact distal pulses.   Pulmonary/Chest: Effort normal. No respiratory distress.  Abdominal: Normal appearance. She exhibits no distension.  Musculoskeletal: Normal range of motion.       Right hand: She exhibits tenderness, bony tenderness and swelling (very mild). She exhibits normal range of motion, normal two-point discrimination, normal capillary refill, no deformity and no laceration. Normal sensation noted. Normal strength noted.  R wrist and hand with FROM intact, mild swelling and TTP at base of CMC joint and extending into 1st metacarpal, no deformity or skin injury, mild bruising without erythema or warmth, sensation and strength grossly intact, distal pulses intact, cap refill brisk and present. No tenderness of scaphoid. No other tenderness along R forearm and elbow  Neurological: She is alert and oriented to person, place, and time. She has normal strength. No sensory deficit.  Skin: Skin is warm, dry and intact. Bruising noted. No abrasion and no rash noted. No erythema.  Psychiatric: She has a normal mood and affect. Her behavior is normal.  Nursing note and vitals reviewed.   ED Course  Procedures (including critical care time)  COORDINATION OF  CARE: 7:00 PM- Discussed treatment plan with patient at bedside and patient agreed to plan.   Labs Review Labs Reviewed - No data to display  Imaging Review Dg Hand Complete Right  04/23/2015   CLINICAL DATA:  Hit hand on bed 2 days ago. Right hand pain and swelling. Initial encounter.  EXAM: RIGHT HAND - COMPLETE 3+ VIEW  COMPARISON:  None.  FINDINGS: There is no evidence of fracture or dislocation. Mild osteoarthritis is seen involving the distal interphalangeal joints of the index and middle fingers. No other significant bone abnormality identified. Soft tissues are unremarkable.  IMPRESSION: No acute findings.  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   Electronically Signed   By: Earle Gell M.D.   On: 04/23/2015 19:14     EKG Interpretation None      MDM   Final diagnoses:  Right hand pain  Hand contusion, right, initial encounter    74 y.o. female here with R hand pain after striking it against her bed yesterday. Associated bruise and mild swelling. Tenderness at [facility] joint. Neurovascularly intact with soft compartments. No erythema or warmth. Will obtain xray  and reassess. Pt declines pain meds. Will reassess shortly.   7:58 PM Xray revealing no fx or dislocation. Will splint to allow area to rest x1wk, discussed ice and elevation, and use of tylenol/motrin for pain and norco as needed for severe pain. Will have her f/up with hand specialist in 1wk for recheck of symptoms. I explained the diagnosis and have given explicit precautions to return to the ER including for any other new or worsening symptoms. The patient understands and accepts the medical plan as it's been dictated and I have answered their questions. Discharge instructions concerning home care and prescriptions have been given. The patient is STABLE and is discharged to home in good condition.   I personally performed the services described in this documentation, which was scribed  in my presence. The recorded information has been reviewed and is accurate.  BP 141/55 mmHg  Pulse 80  Temp(Src) 99 F (37.2 C) (Oral)  Resp 18  SpO2 99%  Meds ordered this encounter  Medications  . HYDROcodone-acetaminophen (NORCO) 5-325 MG per tablet    Sig: Take 1 tablet by mouth every 6 (six) hours as needed for severe pain.    Dispense:  6 tablet    Refill:  0    Order Specific Question:  Supervising Provider    Answer:  Noemi Chapel [[id number]]     Aileena Iglesia Camprubi-Soms, PA-C 04/23/15 [place], MD 04/24/15 0025

## 2015-04-23 NOTE — Discharge Instructions (Signed)
Wear wrist brace for at least 1 week to rest your hand/wrist. Ice and elevate wrist throughout the day. Alternate between ibuprofen or tylenol as needed for pain, and use norco for break through pain. Do not drive or operate machinery with pain medication use. Call the hand specialist to schedule followup appointment for recheck of ongoing hand/wrist pain in 1 week. Return to the ER for changes or worsening symptoms.    Hand Contusion A hand contusion is a deep bruise on your hand area. Contusions are the result of an injury that caused bleeding under the skin. The contusion may turn blue, purple, or yellow. Minor injuries will give you a painless contusion, but more severe contusions may stay painful and swollen for a few weeks. CAUSES  A contusion is usually caused by a blow, trauma, or direct force to an area of the body. SYMPTOMS   Swelling and redness of the injured area.  Discoloration of the injured area.  Tenderness and soreness of the injured area.  Pain. DIAGNOSIS  The diagnosis can be made by taking a history and performing a physical exam. An X-ray, CT scan, or MRI may be needed to determine if there were any associated injuries, such as broken bones (fractures). TREATMENT  Often, the best treatment for a hand contusion is resting, elevating, icing, and applying cold compresses to the injured area. Over-the-counter medicines may also be recommended for pain control. HOME CARE INSTRUCTIONS   Put ice on the injured area.  Put ice in a plastic bag.  Place a towel between your skin and the bag.  Leave the ice on for 15-20 minutes, 03-04 times a day.  Only take over-the-counter or prescription medicines as directed by your caregiver. Your caregiver may recommend avoiding anti-inflammatory medicines (aspirin, ibuprofen, and naproxen) for 48 hours because these medicines may increase bruising.  If told, use an elastic wrap as directed. This can help reduce swelling. You may  remove the wrap for sleeping, showering, and bathing. If your fingers become numb, cold, or blue, take the wrap off and reapply it more loosely.  Elevate your hand with pillows to reduce swelling.  Avoid overusing your hand if it is painful. SEEK IMMEDIATE MEDICAL CARE IF:   You have increased redness, swelling, or pain in your hand.  Your swelling or pain is not relieved with medicines.  You have loss of feeling in your hand or are unable to move your fingers.  Your hand turns cold or blue.  You have pain when you move your fingers.  Your hand becomes warm to the touch.  Your contusion does not improve in 2 days. MAKE SURE YOU:   Understand these instructions.  Will watch your condition.  Will get help right away if you are not doing well or get worse. Document Released: 03/19/2002 Document Revised: 06/21/2012 Document Reviewed: 03/20/2012 [facility] Patient Information 2015 [place], [place]. This information is not intended to replace advice given to you by your health care provider. Make sure you discuss any questions you have with your health care provider.  Cryotherapy Cryotherapy is when you put ice on your injury. Ice helps lessen pain and puffiness (swelling) after an injury. Ice works the best when you start using it in the first 24 to 48 hours after an injury. HOME CARE  Put a dry or damp towel between the ice pack and your skin.  You may press gently on the ice pack.  Leave the ice on for no more than 10 to  20 minutes at a time.  Check your skin after 5 minutes to make sure your skin is okay.  Rest at least 20 minutes between ice pack uses.  Stop using ice when your skin loses feeling (numbness).  Do not use ice on someone who cannot tell you when it hurts. This includes small children and people with memory problems (dementia). GET HELP RIGHT AWAY IF:  You have white spots on your skin.  Your skin turns blue or pale.  Your skin feels waxy or hard.  Your  puffiness gets worse. MAKE SURE YOU:   Understand these instructions.  Will watch your condition.  Will get help right away if you are not doing well or get worse. Document Released: 03/15/2008 Document Revised: 12/20/2011 Document Reviewed: 05/20/2011 [facility] Patient Information 2015 [place], [place]. This information is not intended to replace advice given to you by your health care provider. Make sure you discuss any questions you have with your health care provider.

## 2015-04-23 NOTE — ED Notes (Signed)
Pt hit rt hand on side of bed yesterday.  Using ice and tylenol at home with no relief.  Area is red today.  Full mobility noted to hand

## 2015-04-28 ENCOUNTER — Telehealth: Payer: Self-pay | Admitting: *Deleted

## 2015-04-28 NOTE — Telephone Encounter (Signed)
Pt called and left phone number on nurse voice mail with no messages.  Attempted to call pt back unsuccessfully.  Unable to leave message due to voice mail not set up yet. Pt's  Phone    [phone].

## 2015-05-20 ENCOUNTER — Ambulatory Visit (HOSPITAL_BASED_OUTPATIENT_CLINIC_OR_DEPARTMENT_OTHER): Payer: Medicare Other

## 2015-05-20 VITALS — BP 129/64 | HR 62 | Temp 98.4°F

## 2015-05-20 DIAGNOSIS — E538 Deficiency of other specified B group vitamins: Secondary | ICD-10-CM

## 2015-05-20 MED ORDER — DENOSUMAB 120 MG/1.7ML ~~LOC~~ SOLN: 120.0000 mg | Freq: Once | SUBCUTANEOUS | Status: DC

## 2015-05-20 MED ORDER — CYANOCOBALAMIN 1000 MCG/ML IJ SOLN
1000.0000 ug | Freq: Once | INTRAMUSCULAR | Status: AC
  Administered 2015-05-20: 1000 ug via INTRAMUSCULAR

## 2015-05-20 NOTE — Progress Notes (Signed)
X-geva held due to needing dental work done.  Is going to get teeth pulled in about 2-3 weeks.  Will make sure dentist give OK before getting started on X-geva.

## 2015-05-27 ENCOUNTER — Ambulatory Visit (INDEPENDENT_AMBULATORY_CARE_PROVIDER_SITE_OTHER): Payer: Medicare Other | Admitting: Internal Medicine

## 2015-05-27 ENCOUNTER — Encounter: Payer: Self-pay | Admitting: Internal Medicine

## 2015-05-27 VITALS — BP 126/64 | HR 77 | Temp 97.6°F | Ht 65.0 in | Wt 189.0 lb

## 2015-05-27 DIAGNOSIS — R21 Rash and other nonspecific skin eruption: Secondary | ICD-10-CM

## 2015-05-27 DIAGNOSIS — S025XXA Fracture of tooth (traumatic), initial encounter for closed fracture: Secondary | ICD-10-CM | POA: Diagnosis not present

## 2015-05-27 DIAGNOSIS — I10 Essential (primary) hypertension: Secondary | ICD-10-CM

## 2015-05-27 MED ORDER — PREDNISONE 10 MG PO TABS: ORAL_TABLET | ORAL | Status: DC

## 2015-05-27 MED ORDER — AMOXICILLIN 500 MG PO CAPS: 1000.0000 mg | ORAL_CAPSULE | Freq: Two times a day (BID) | ORAL | Status: DC

## 2015-05-27 MED ORDER — ACETAMINOPHEN-CODEINE 300-30 MG PO TABS: 1.0000 | ORAL_TABLET | Freq: Four times a day (QID) | ORAL | Status: DC | PRN

## 2015-05-27 MED ORDER — TRIAMCINOLONE ACETONIDE 0.1 % EX CREA: 1.0000 "application " | TOPICAL_CREAM | Freq: Two times a day (BID) | CUTANEOUS | Status: DC

## 2015-05-27 NOTE — Patient Instructions (Addendum)
You had the steroid shot today  Please take all new medication as prescribed - the antibiotic, and pain medication, and prednisone, and cream if needed  Please continue all other medications as before, and refills have been done if requested.  Please have the pharmacy call with any other refills you may need.  Please keep your appointments with your specialists as you may have planned  You will be contacted regarding the referral for: dental referral if possible

## 2015-05-27 NOTE — Assessment & Plan Note (Addendum)
Mild to mod, prob allergic, etiology unclear, for depomedro IM, predpac asd, otc zyrtec prn,  to f/u any worsening symptoms or concerns

## 2015-05-27 NOTE — Assessment & Plan Note (Addendum)
For referral dental at [facility] per request, may not qualify but will try, for pain control med, also amoxil course for prob mild infection early

## 2015-05-27 NOTE — Assessment & Plan Note (Signed)
stable overall by history and exam, recent data reviewed with pt, and pt to continue medical treatment as before,  to f/u any worsening symptoms or concerns BP Readings from Last 3 Encounters:  05/27/15 126/64  05/20/15 129/64  04/23/15 141/55

## 2015-05-27 NOTE — Progress Notes (Signed)
Subjective:    Patient ID: Margaret Rubio, female    DOB: [birth year], 74 y.o.   MRN: [id number]  HPI    Here with 2 days onset mild to mod but very itchy rash with several lesions to arms and torso, but no lip/tongue/throat swelling. Pt denies chest pain, increased sob or doe, wheezing, orthopnea, PND, increased LE swelling, palpitations, dizziness or syncope.   Pt denies fever, wt loss, night sweats, loss of appetite, or other constitutional symptoms  Due to start tx for met breast ca soon.  Also with pain to left upper lateral incisor, worsening x 1 wk after fracture, needs to have out, was suggested by soc services to consider referral to [facility].   Past Medical History  Diagnosis Date  . 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 ASTHMA 01/31/2008  . BREAST CANCER, HX OF 11/14/2009  . BRONCHITIS, ACUTE 01/31/2008  . Cough 10/17/2009  . FATIGUE 11/14/2009  . GANGLION CYST 05/01/2009  . HYPERLIPIDEMIA 05/21/2007  . HYPERTENSION 05/05/2007  . LOW BACK PAIN 05/21/2007  . MENOPAUSAL DISORDER 05/22/2010  . Pain in joint, lower leg 05/10/2008  . RASH-NONVESICULAR 05/01/2009  . SHOULDER PAIN, LEFT 11/14/2009  . 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 VITAMIN B12 DEFICIENCY 05/23/2010  . rt breast ca dx'd 2009    right breast   Past Surgical History  Procedure Laterality Date  . Removal of neck lump      benigh 1986  . Myomectomy      reports that she has never smoked. She does not have any smokeless tobacco history on file. She reports that she does not drink alcohol or use illicit drugs. family history includes Asthma in her other; Diabetes in her other; Heart disease in her father; Hypertension in her father and other. No Known Allergies Current Outpatient Prescriptions on File Prior to Visit  Medication Sig Dispense Refill  . anastrozole (ARIMIDEX) 1 MG tablet Take 1 tablet (1 mg total) by mouth daily. 30 tablet 9  . Ascorbic Acid (VITAMIN C) 100 MG CHEW Chew 1 tablet (100 mg total) by mouth every morning.  90 each 3  . ergocalciferol (VITAMIN D2) 50000 UNITS capsule Take 1 capsule (50,000 Units total) by mouth once a week. 4 capsule 5   No current facility-administered medications on file prior to visit.   Review of Systems  Constitutional: Negative for unusual diaphoresis or night sweats HENT: Negative for ringing in ear or discharge Eyes: Negative for double vision or worsening visual disturbance.  Respiratory: Negative for choking and stridor.   Gastrointestinal: Negative for vomiting or other signifcant bowel change Genitourinary: Negative for hematuria or change in urine volume.  Musculoskeletal: Negative for other MSK pain or swelling Skin: Negative for color change and worsening wound.  Neurological: Negative for tremors and numbness other than noted  Psychiatric/Behavioral: Negative for decreased concentration or agitation other than above       Objective:   Physical Exam BP 126/64 mmHg  Pulse 77  Temp(Src) 97.6 F (36.4 C) (Oral)  Ht _0  (1.651 m)  Wt 189 lb (85.73 kg)  BMI 31.45 kg/m2  SpO2 97% VS noted,  Constitutional: Pt appears in no significant distress HENT: Head: NCAT.  Right Ear: External ear normal.  Left Ear: External ear normal.  Eyes: . Pupils are equal, round, and reactive to light. Conjunctivae and EOM are normal Left upper tooth lateral incisor with swelling/tender , tooth itself fx Neck: Normal range of motion. Neck supple.  Cardiovascular: Normal rate and  regular rhythm.   Pulmonary/Chest: Effort normal and breath sounds without rales or wheezing.  Abd:  Soft, NT, ND, + BS Neurological: Pt is alert. Not confused , motor grossly intact Skin: Skin is warm. + mult NT blanching variable sized erythem rash area to inner elbows, torso and extremities,, no LE edema Psychiatric: Pt behavior is normal. No agitation.     Assessment & Plan:

## 2015-05-28 DIAGNOSIS — R21 Rash and other nonspecific skin eruption: Secondary | ICD-10-CM | POA: Diagnosis not present

## 2015-05-28 MED ORDER — METHYLPREDNISOLONE ACETATE 80 MG/ML IJ SUSP
80.0000 mg | Freq: Once | INTRAMUSCULAR | Status: AC
  Administered 2015-05-28: 80 mg via INTRAMUSCULAR

## 2015-05-28 NOTE — Addendum Note (Signed)
Addended by: Lyman Bishop on: 05/28/2015 08:08 AM   Modules accepted: Orders

## 2015-05-30 ENCOUNTER — Telehealth: Payer: Self-pay | Admitting: Internal Medicine

## 2015-05-30 NOTE — Telephone Encounter (Signed)
Pt advised and agrees to use OTC tylenol for pain and follow up if needed

## 2015-05-30 NOTE — Telephone Encounter (Signed)
Patient called in to advise that she went to her dentist this week and had her tooth looked at. The dentist advised that her tooth had no root due to a root canal and that would not have caused her headaches. She states that she is still having headaches, and asks if there is anything further we can do to look into the cause. Please give the patient a call with update.

## 2015-05-30 NOTE — Telephone Encounter (Signed)
I would not have much to say at this point except that her HA is most likely "benign" as based on her last exam she did not have signs pointing to possible brain tumor or stroke.  I would continue the same treatment, and possible take advil or tylenol as needed for pain

## 2015-06-17 ENCOUNTER — Ambulatory Visit: Payer: Medicare Other

## 2015-06-17 ENCOUNTER — Other Ambulatory Visit: Payer: Medicare Other

## 2015-06-17 ENCOUNTER — Ambulatory Visit: Payer: Medicare Other | Admitting: Hematology

## 2015-06-27 DIAGNOSIS — R51 Headache: Secondary | ICD-10-CM | POA: Diagnosis not present

## 2015-06-27 DIAGNOSIS — Z806 Family history of leukemia: Secondary | ICD-10-CM | POA: Diagnosis not present

## 2015-06-27 DIAGNOSIS — G529 Cranial nerve disorder, unspecified: Secondary | ICD-10-CM | POA: Diagnosis not present

## 2015-06-27 DIAGNOSIS — G8389 Other specified paralytic syndromes: Secondary | ICD-10-CM | POA: Diagnosis not present

## 2015-06-27 DIAGNOSIS — C7951 Secondary malignant neoplasm of bone: Secondary | ICD-10-CM | POA: Diagnosis not present

## 2015-06-27 DIAGNOSIS — C50919 Malignant neoplasm of unspecified site of unspecified female breast: Secondary | ICD-10-CM | POA: Diagnosis not present

## 2015-06-27 DIAGNOSIS — Z853 Personal history of malignant neoplasm of breast: Secondary | ICD-10-CM | POA: Diagnosis not present

## 2015-06-27 DIAGNOSIS — Z79899 Other long term (current) drug therapy: Secondary | ICD-10-CM | POA: Diagnosis not present

## 2015-06-27 DIAGNOSIS — C50912 Malignant neoplasm of unspecified site of left female breast: Secondary | ICD-10-CM | POA: Diagnosis not present

## 2015-07-07 ENCOUNTER — Telehealth: Payer: Self-pay | Admitting: Hematology

## 2015-07-07 NOTE — Telephone Encounter (Signed)
Returned patient call re rescheduling 9/27 appointments. Per patient out of town - moved appointments to 10/3. Patient has new date/time.

## 2015-07-08 ENCOUNTER — Other Ambulatory Visit: Payer: Medicare Other

## 2015-07-08 ENCOUNTER — Ambulatory Visit: Payer: Medicare Other

## 2015-07-08 ENCOUNTER — Ambulatory Visit: Payer: Medicare Other | Admitting: Hematology

## 2015-07-14 ENCOUNTER — Telehealth: Payer: Self-pay | Admitting: Hematology

## 2015-07-14 ENCOUNTER — Other Ambulatory Visit (HOSPITAL_BASED_OUTPATIENT_CLINIC_OR_DEPARTMENT_OTHER): Payer: Medicare Other

## 2015-07-14 ENCOUNTER — Ambulatory Visit (HOSPITAL_BASED_OUTPATIENT_CLINIC_OR_DEPARTMENT_OTHER): Payer: Medicare Other | Admitting: Hematology

## 2015-07-14 ENCOUNTER — Encounter: Payer: Self-pay | Admitting: Hematology

## 2015-07-14 ENCOUNTER — Ambulatory Visit (HOSPITAL_BASED_OUTPATIENT_CLINIC_OR_DEPARTMENT_OTHER): Payer: Medicare Other

## 2015-07-14 VITALS — BP 165/71 | HR 66 | Temp 98.5°F | Resp 18 | Ht 65.0 in | Wt 195.4 lb

## 2015-07-14 DIAGNOSIS — C7951 Secondary malignant neoplasm of bone: Secondary | ICD-10-CM | POA: Diagnosis not present

## 2015-07-14 DIAGNOSIS — Z170 Estrogen receptor positive status [ER+]: Secondary | ICD-10-CM | POA: Diagnosis not present

## 2015-07-14 DIAGNOSIS — D649 Anemia, unspecified: Secondary | ICD-10-CM

## 2015-07-14 DIAGNOSIS — E538 Deficiency of other specified B group vitamins: Secondary | ICD-10-CM | POA: Diagnosis not present

## 2015-07-14 DIAGNOSIS — C50911 Malignant neoplasm of unspecified site of right female breast: Secondary | ICD-10-CM

## 2015-07-14 DIAGNOSIS — Z79811 Long term (current) use of aromatase inhibitors: Secondary | ICD-10-CM

## 2015-07-14 DIAGNOSIS — C50511 Malignant neoplasm of lower-outer quadrant of right female breast: Secondary | ICD-10-CM

## 2015-07-14 LAB — COMPREHENSIVE METABOLIC PANEL (CC13)
ALBUMIN: 3.7 g/dL (ref 3.5–5.0)
ALT: 15 U/L (ref 0–55)
AST: 34 U/L (ref 5–34)
Alkaline Phosphatase: 67 U/L (ref 40–150)
Anion Gap: 4 mEq/L (ref 3–11)
BUN: 8.3 mg/dL (ref 7.0–26.0)
CALCIUM: 9.3 mg/dL (ref 8.4–10.4)
CO2: 28 mEq/L (ref 22–29)
Chloride: 106 mEq/L (ref 98–109)
Creatinine: 0.7 mg/dL (ref 0.6–1.1)
Glucose: 86 mg/dl (ref 70–140)
POTASSIUM: 4.1 meq/L (ref 3.5–5.1)
Sodium: 139 mEq/L (ref 136–145)
TOTAL PROTEIN: 6.7 g/dL (ref 6.4–8.3)
Total Bilirubin: 0.44 mg/dL (ref 0.20–1.20)

## 2015-07-14 LAB — CBC WITH DIFFERENTIAL/PLATELET
BASO%: 0.4 % (ref 0.0–2.0)
BASOS ABS: 0 10*3/uL (ref 0.0–0.1)
EOS ABS: 0 10*3/uL (ref 0.0–0.5)
EOS%: 0.7 % (ref 0.0–7.0)
HEMATOCRIT: 29.2 % — AB (ref 34.8–46.6)
HEMOGLOBIN: 9.4 g/dL — AB (ref 11.6–15.9)
LYMPH#: 1.6 10*3/uL (ref 0.9–3.3)
LYMPH%: 34.7 % (ref 14.0–49.7)
MCH: 29.1 pg (ref 25.1–34.0)
MCHC: 32.2 g/dL (ref 31.5–36.0)
MCV: 90.4 fL (ref 79.5–101.0)
MONO#: 0.4 10*3/uL (ref 0.1–0.9)
MONO%: 8.3 % (ref 0.0–14.0)
NEUT#: 2.6 10*3/uL (ref 1.5–6.5)
NEUT%: 55.9 % (ref 38.4–76.8)
Platelets: 223 10*3/uL (ref 145–400)
RBC: 3.23 10*6/uL — ABNORMAL LOW (ref 3.70–5.45)
RDW: 14.5 % (ref 11.2–14.5)
WBC: 4.6 10*3/uL (ref 3.9–10.3)

## 2015-07-14 MED ORDER — CYANOCOBALAMIN 1000 MCG/ML IJ SOLN
1000.0000 ug | Freq: Once | INTRAMUSCULAR | Status: AC
  Administered 2015-07-14: 1000 ug via INTRAMUSCULAR

## 2015-07-14 MED ORDER — TRAMADOL HCL 50 MG PO TABS: 50.0000 mg | ORAL_TABLET | Freq: Four times a day (QID) | ORAL | Status: DC | PRN

## 2015-07-14 MED ORDER — DENOSUMAB 120 MG/1.7ML ~~LOC~~ SOLN: 120.0000 mg | Freq: Once | SUBCUTANEOUS | Status: DC

## 2015-07-14 NOTE — Telephone Encounter (Signed)
Pt confirmed labs/ov per 10/03 POF, gave pt AVS and Calendar... KJ °

## 2015-07-14 NOTE — Patient Instructions (Signed)

## 2015-07-14 NOTE — Progress Notes (Signed)
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 Pt declined surgery or other treatment.     05/20/2011 Tumor Marker right breast mass biopsy showed IDA, ER+/PR+/HER2- (ratio 1.37)   09/04/2011 Cancer Staging PET scan showed no distant mets   10/08/2014 Progression  lumbar spine MRI showed multiple bone metastasis , highly suspicious for  metastatic disease.   12/13/2014 Pathology Results Right breast needle biopsy showed invasive ductal carcinoma, ER 100% positive, PR 2% positive, HER-2 negative with ratio 1.45 and copy number 2.25   12/14/2014 -  Anti-estrogen oral therapy Arimidex 36m daily      HISTORY OF PRESENTING ILLNESS:  Margaret MAS89y.o. female  Was referred by her primary care physician to discuss the management probable metastatic breast cancer.  She was initially diagnosed with right breast cancer in November 2009. 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 In December of 2009, she underwent a right axillary lymph node biopsy on 09/24/2008 which showed metastatic ductal carcinoma. (Case No: [id number] The tumor was ER 100% positive, PR 0% negative, Ki67 - 22%. 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 The ratio of HER-2:CEP 17 was 2.07.  She decided to pursue nature therapy by diet and exercise, and did not have surgery and other tranditional therapy for her breast cancer.   In May 2011 she underwent a mammogram that showed a 3.8 cm invasive duct carcinoma in the subareolar region of the right breast which had increased since November 2009.  The tumor  was ER  99% positive,  PR 4% positive, HER-2 negative ( ratio 1.37).  She was seen by Dr. EEstella Huskand Dr. NLucia Gaskins  However, she failed multiple appointments and [place] involved putting something on her right breast that caused some scarring.  She noticed the enlarged right breast mass and nipple inversion about one year ago. No pain, but some skin change. She developed left low back/buttock and hip and  pain in Nov 2015, was seen by PCP Dr. JJenny Reichmannwho prescribed vicodin and tramadol and pain improved with meds. Due to the persistent pain, she underwent a lumber MRI  On 10/08/2014, which showed multiple bone metastasis.  She run out pain medication about a week ago,  But did not call for refill. She states her pain is not bad , but she appears to be uncomfortable when she stands up and walks.  She otherwise feels well overall,  Has good appetite and energy level. She lives alone and still functions well.  She comes in with her niece today.   Current therapy: Anastrozole 1 mg once daily started on 12/10/2014  INTERIM HISTORY: Ms. AGalyonreturns for follow-up with her daughter. She has been having left front heache for the past one month, it is persistent, moderate, she has tried atenolol and ibuprofen which helps some. She denies any vision change, or other neurological symptoms. [place] [facility] The patient the skin was negative. She otherwise is doing  well, her back and hip pain has resolved, she has good appetite and eating well. She gained 3-4 pounds in the past few months. She denies any other new symptoms.  MEDICAL HISTORY:  Past Medical History  Diagnosis Date  . 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 ASTHMA 01/31/2008  . BREAST CANCER, HX OF 11/14/2009  . BRONCHITIS, ACUTE 01/31/2008  . Cough 10/17/2009  . FATIGUE 11/14/2009  . GANGLION CYST 05/01/2009  .  HYPERLIPIDEMIA 05/21/2007  . HYPERTENSION 05/05/2007  . LOW BACK PAIN 05/21/2007  . MENOPAUSAL DISORDER 05/22/2010  . Pain in joint, lower leg 05/10/2008  . RASH-NONVESICULAR 05/01/2009  . SHOULDER PAIN, LEFT 11/14/2009  . 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 VITAMIN B12 DEFICIENCY 05/23/2010  . rt breast ca dx'd 2009    right breast    SURGICAL HISTORY: Past Surgical History  Procedure Laterality Date  . Removal of neck lump      benigh 1986  . Myomectomy      SOCIAL HISTORY: History   Social History  . Marital Status: Widowed    Spouse Name: N/A    Number of Children: 36, lives in [place].   . Years of Education: N/A   Occupational History  . Not on file.   Social History Main Topics  . Smoking status: Never Smoker   . Smokeless tobacco: Not on file  . Alcohol Use: No  . Drug Use: No  . Sexual Activity: Not on file   Other Topics Concern  . Not on file   Social History Narrative    FAMILY HISTORY: Family History  Problem Relation Age of Onset  . Asthma Other   . Diabetes Other   . Hypertension Other   . Hypertension Father   . Heart disease Father   Paternal aunt had cancer, unknown type. No other family history of malignancy   ALLERGIES:  has No Known Allergies.  MEDICATIONS:  Current Outpatient Prescriptions  Medication Sig Dispense Refill  . Ascorbic Acid (VITAMIN C) 100 MG CHEW Chew by mouth every morning.    Marland Kitchen aspirin EC 81 MG tablet Take 1 tablet (81 mg total) by mouth daily. 90 tablet 11  . ergocalciferol (VITAMIN D2) 50000 UNITS capsule Take 1 capsule (50,000 Units total) by mouth once a week. 4 capsule 3  . HYDROcodone-acetaminophen (NORCO/VICODIN) 5-325 MG per tablet Take 1 tablet by mouth every 6 (six) hours as needed for moderate pain. 60 tablet 0  . morphine (MS CONTIN) 15 MG 12 hr tablet Take 1 tablet (15 mg total) by mouth every 12 (twelve) hours. 60 tablet 0  . 0    0  . tiZANidine (ZANAFLEX) 4 MG tablet Take 1 tablet (4 mg total) by mouth every 6  (six) hours as needed for muscle spasms. 40 tablet 1   No current facility-administered medications for this visit.    REVIEW OF SYSTEMS:   Constitutional: Denies fevers, chills or abnormal night sweats Eyes: Denies blurriness of vision, double vision or watery eyes Ears, nose, mouth, throat, and face: Denies mucositis or sore throat Respiratory: Denies cough, dyspnea or wheezes Cardiovascular: Denies palpitation, chest discomfort or lower extremity swelling Gastrointestinal:  Denies nausea, heartburn or change in bowel habits Skin: Denies abnormal skin rashes Lymphatics: Denies new lymphadenopathy or easy bruising Neurological:Denies numbness, tingling or new weaknesses Behavioral/Psych: Mood is stable, no new changes  All other systems were reviewed with the patient and are negative.  PHYSICAL EXAMINATION: ECOG PERFORMANCE STATUS: 1 - Symptomatic but completely ambulatory Weight 188 pounds,  BSA 1.98, blood pressure 159/70, heart rate 65, respiratory rate 19, temperature 98.6, pulse ox 100% GENERAL:alert, no distress and comfortable SKIN: skin color, texture, turgor are normal, no rashes or significant lesions EYES: normal, conjunctiva are pink and non-injected, sclera clear OROPHARYNX:no exudate, no erythema and lips, buccal mucosa, and tongue normal  NECK: supple, thyroid normal size, non-tender, without nodularity LYMPH:  no palpable lymphadenopathy in the cervical, axillary or inguinal LUNGS: clear to auscultation and percussion with normal breathing effort HEART: regular rate & rhythm and no murmurs and no lower extremity edema ABDOMEN:abdomen soft, non-tender and normal bowel sounds Musculoskeletal:no cyanosis of digits and no clubbing  PSYCH: alert & oriented x 3 with fluent speech NEURO: no focal motor/sensory deficits Breasts: Breast inspection showed  Significantly enlarged right breast, compared to left breast, (+) skin pigmentations , nipple inversion and a dry ulcer/  scar below the nipple, no discharge.  The right breast is softer than before, nontender , no discrete mass. Palpation of the  Left breasts and axilla revealed no obvious mass that I could appreciate.   LABORATORY DATA:  I have reviewed the data as listed CBC Latest Ref Rng 07/14/2015 03/25/2015 01/31/2015  WBC 3.9 - 10.3 10e3/uL 4.6 4.6 4.2  Hemoglobin 11.6 - 15.9 g/dL 9.4(L) 10.8(L) 10.2(L)  Hematocrit 34.8 - 46.6 % 29.2(L) 33.3(L) 31.1(L)  Platelets 145 - 400 10e3/uL 223 214 208    CMP Latest Ref Rng 03/25/2015 01/31/2015 12/27/2014  Glucose 70 - 140 mg/dl 83 83 85  BUN 7.0 - 26.0 mg/dL 8.4 12.2 10.1  Creatinine 0.6 - 1.1 mg/dL 0.7 0.6 0.7  Sodium 136 - 145 mEq/L 140 140 138  Potassium 3.5 - 5.1 mEq/L 4.1 3.8 4.1  Chloride 96 - 112 mEq/L - - -  CO2 22 - 29 mEq/L _0 Calcium 8.4 - 10.4 mg/dL 9.1 8.5 8.7  Total Protein 6.4 - 8.3 g/dL 6.6 6.4 6.4  Total Bilirubin 0.20 - 1.20 mg/dL 0.43 0.36 0.42  Alkaline Phos 40 - 150 U/L 67 85 78  AST 5 - 34 U/L 42(H) 19 25  ALT 0 - 55 U/L 32 9 13   CA 27.29  Status: Finalresult Visible to patient:  Not Released Nextappt: Today at 02:45 PM in Oncology ([facility] Nurse) Dx:  Breast cancer, right ([place])              Ref Range 5moago  523mogo  10m57moo     CA 27.29 0 - 39 U/mL 317 (H) 402 (H) 414 (H)          PATHOLOGY REPORT  RIGHT BREAST MASS, 6 O'CLOCK, NEEDLE CORE BIOPSIES: 09/03/2008 MICROSCOPIC FOCUS OF DUCTAL CARCINOMA IN SITU ASSOCIATED WITH MICROSCOPIC FOCUS OF MICROINVASION. SEE COMMENT.  LYMPH NODE, RIGHT AXILLARY, NEEDLE CORE BIOPSIES: METASTATIC DUCTAL CARCINOMA.  09/24/2008  COMMENT There are needle core biopsies of lymph node showing partial replacement by metastatic adenocarcinoma consistent with metastasis from a breast primary, metastatic ductal carcinoma. Since the breast prognostic profile is not performed on the previous material because of limited invasive tumor  ([id number] breast prognostic profile will be performed on the tumor in the lymph node and reported in a separate report. Interpretation HER-2/NEU BY CISH -- SHOWS AMPLIFICATION BY CISH ANALYSIS. THE RATIO OF HER-2: CEP 17 SIGNALS WAS 2.07  Diagnosis 05/20/2011 Breast, right, needle core biopsy - INVASIVE DUCTAL CARCINOMA, SEE COMMENT. - LYMPHOVASCULAR INVASION PRESENT. Estrogen Receptor (Negative, <1%): 99%, STRONG STAINING INTENSITY Progesterone Receptor (Negative, <1%):  4%, STRONG HER-2/NEU BY CISH - NO AMPLIFICATION OF HER-2 DETECTED. THE RATIO OF HER-2: CEP 17 SIGNALS WAS 1.38.  Breast, right, needle core biopsy, 6:30 o'clock 12/13/2014 - INVASIVE MAMMARY CARCINOMA. - SEE MICROSCOPIC DESCRIPTION. Microscopic Comment The features favor Grade II invasive ductal carcinoma. A breast prognostic profile will be performed. Dr. Donato Heinz agrees. The results were called to [facility] on 12/16/14. (JDP:ds 12/16/14) ADDENDUM: Immunohistochemistry for E-cadherin is performed and shows diffuse strong positivity consistent with invasive ductal carcinoma. Estrogen Receptor: 100%, POSITIVE, STRONG STAINING INTENSITY Progesterone Receptor: 2%, POSITIVE, STRONG STAINING INTENSITY Proliferation Marker Ki67: 27% HER-2/NEU BY CISH - NEGATIVE. RESULT RATIO OF HER2: CEP 17 SIGNALS 1.45 AVERAGE HER2 COPY NUMBER PER CELL 2.25   RADIOGRAPHIC STUDIES: I have personally reviewed the radiological images as listed and agreed with the findings in the report.  Mr Lumbar Spine Wo Contrast 10/08/2014   IMPRESSION: 1. New diffuse lumbosacral metastatic disease compatible with metastatic breast cancer. No cauda equina compression or extraosseous extension of tumor. No pathologic compression fracture. 2. 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 In this patient with LEFT hip and gluteal pain, the metastatic disease is the most likely cause however there is also potential for facet  mediated referred pain or irritation of the exited LEFT L3 nerve root at L3-L4. 3. These results will be called to the ordering clinician or representative by the Radiologist Assistant, and communication documented in the PACS or zVision Dashboard.   Electronically Signed   By: Dereck Ligas M.D.   On: 10/08/2014 16:33   Bilateral mammogram and ultrasound 12/13/2014 IMPRESSION: Findings compatible with increasing diffuse right breast malignancy with dermal lymphatic invasion. Dominant mass in the lower right right breast breast has slightly increased in size.   ASSESSMENT & PLAN:   Mrs. Margaret Rubio is a 74 year old  African-American female, who was diagnosed with node positive right breast  Invasive adenocarcinoma in  2009, untreated,  Now presents with low back pain and lumbar spine MRI is highly suspicious for bone metastasis.  1.  Right breast IDA,  Strongly ER positive,  PR weakly positive,  HER-2 positive on first biopsy , but negative on second and third  biopsy 3 and 6 years later, with diffuse bone metastatic now, T4NxM1 stage IV  - it has been over 6 years since her initial diagnosis. Her lumbar MRI is very suspicious for bone metastasis.  Her disease course is more consistent with ER positive , HER-2 negative disease. - I discussed her PET scan results with her and her daughter. 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 -I recommend bone biopsy to confirm the metastasis, and also repeat ER/PR/HER-2 to guide further therapy. Her daughter strongly encouraged her to consider biopsy but she again declined bone biopsy after long conversation.  -I discussed her recent breast biopsy results. Again this is a ER/PR strongly positive invasive ductal carcinoma, HER-2 is negative on this biopsy. Giving the only marginal positive HER-2 on first biopsy, and the natural history of disease, I think this is most consistent with a HER-2 negative disease. - I discussed that her  disease at this stage is unlikely curable , I would not offer surgery. The goal of treatment is palliative. - Giving the bone metastasis only, I recommend endocrine therapy as a first-line treatment. I discussed aromatase inhibitors with or without palbociclib, potential side effects of each medication were discussed with patient, which includes but not limited to, fatigue, hot flash, vaginal dryness,  musculoskeletal pain and stiffness, cytopenia, risk of infection. -She finally agreed to start anastrozole, and is tolerating well, will continue. -She is scheduled to have a restaging PET scan in a few days this week. -We discussed about adding palbociclib, the benefit and 5 potential side effects were discussed with patient, I given her written material today. She is more open now, and will think about it. -She is very interested in complementary therapy, I spoke with the practitioner who offers her complementary therapy.  I explained the possibility of interaction between this and anastrozole, but that she seems still want to take it regardless.  -Continue anastrozole  2. Left frontal headaches -Etiology unclear. The patient she underwent a brain scan last [facility], I asked her to sign the release consent, and I'll get a copy of the report -I encouraged her to continue use Tylenol and ibuprofen as needed. I also given a prescription of tramadol 50 mg every 8 hours as needed for pain control. -Giving the location of the headache, I'll refer her to ophthalmologist to ruled out eye disease.  3.  Low back/ hip pain,  Secondary to metastatic cancer -He has resolved since she started anastrozole -We'll start on Xgeva for her bone metastasis. Benefit and side effects, especially jaw necrosis, were discussed with patient. She is going to have her denture down just months, we'll start injection next months.  4. Anemia -Likely secondary to her breast cancer, bone metastases and  the B12 deficiency -Slightly improved after weekly B12 injections. Continue B12 monthly for now -No need for transfusion. We'll follow up closely.  Plan: - continue Arimidex -Tramadol for her headaches, ophthalmology referral -She is scheduled for PET scanning in 2 days, I'll call her with the results, I'll bring her back to discuss if there is disease progression -Return to clinic in 1 month's, with first Xgeva injection, and we'll decide about [facility] -Patient declined a flu shot. She got sick after flu shots twice in the past few years.  All questions were answered. The patient knows to call the clinic with any problems, questions or concerns. I spent 20 minutes counseling the patient face to face. The total time spent in the appointment was 25 minutes and more than 50% was on counseling.     Truitt Merle, MD 07/14/2015

## 2015-07-15 LAB — CANCER ANTIGEN 27.29: CA 27.29: 440 U/mL — ABNORMAL HIGH (ref 0–39)

## 2015-07-17 ENCOUNTER — Other Ambulatory Visit: Payer: Self-pay | Admitting: Hematology

## 2015-07-17 ENCOUNTER — Ambulatory Visit (HOSPITAL_COMMUNITY)
Admission: RE | Admit: 2015-07-17 | Discharge: 2015-07-17 | Disposition: A | Discharge status: Home / Self Care | Payer: Medicare Other | Source: Ambulatory Visit | Attending: Hematology | Admitting: Hematology

## 2015-07-17 DIAGNOSIS — J90 Pleural effusion, not elsewhere classified: Secondary | ICD-10-CM | POA: Insufficient documentation

## 2015-07-17 DIAGNOSIS — R933 Abnormal findings on diagnostic imaging of other parts of digestive tract: Secondary | ICD-10-CM | POA: Diagnosis not present

## 2015-07-17 DIAGNOSIS — J320 Chronic maxillary sinusitis: Secondary | ICD-10-CM | POA: Diagnosis not present

## 2015-07-17 DIAGNOSIS — C50911 Malignant neoplasm of unspecified site of right female breast: Secondary | ICD-10-CM | POA: Diagnosis not present

## 2015-07-17 DIAGNOSIS — C7951 Secondary malignant neoplasm of bone: Secondary | ICD-10-CM | POA: Insufficient documentation

## 2015-07-17 DIAGNOSIS — D3502 Benign neoplasm of left adrenal gland: Secondary | ICD-10-CM | POA: Insufficient documentation

## 2015-07-17 LAB — GLUCOSE, CAPILLARY: GLUCOSE-CAPILLARY: 87 mg/dL (ref 65–99)

## 2015-07-17 MED ORDER — FLUDEOXYGLUCOSE F - 18 (FDG) INJECTION
9.5900 | Freq: Once | INTRAVENOUS | Status: DC | PRN
  Administered 2015-07-17: 9.59 via INTRAVENOUS
  Filled 2015-07-17: qty 9.59

## 2015-07-18 ENCOUNTER — Telehealth: Payer: Self-pay | Admitting: Hematology

## 2015-07-18 NOTE — Telephone Encounter (Signed)
left a message with 10/10 appointments and to cancel lab per 10/6 pof

## 2015-07-18 NOTE — Telephone Encounter (Signed)
I spoke with pt and her daughter (per pt's request) and explained her PET scan findings. She is coming to see me on Monday and we will go over [place].  Truitt Merle  07/18/2015

## 2015-07-21 ENCOUNTER — Encounter: Payer: Self-pay | Admitting: Hematology

## 2015-07-21 ENCOUNTER — Telehealth: Payer: Self-pay | Admitting: Pharmacist

## 2015-07-21 ENCOUNTER — Telehealth: Payer: Self-pay | Admitting: Hematology

## 2015-07-21 ENCOUNTER — Ambulatory Visit (HOSPITAL_BASED_OUTPATIENT_CLINIC_OR_DEPARTMENT_OTHER): Payer: Medicare Other | Admitting: Hematology

## 2015-07-21 VITALS — BP 147/59 | HR 68 | Temp 98.0°F | Resp 20 | Ht 65.0 in | Wt 191.2 lb

## 2015-07-21 DIAGNOSIS — H519 Unspecified disorder of binocular movement: Secondary | ICD-10-CM | POA: Diagnosis not present

## 2015-07-21 DIAGNOSIS — C7951 Secondary malignant neoplasm of bone: Secondary | ICD-10-CM

## 2015-07-21 DIAGNOSIS — R51 Headache: Secondary | ICD-10-CM

## 2015-07-21 DIAGNOSIS — Z853 Personal history of malignant neoplasm of breast: Secondary | ICD-10-CM | POA: Diagnosis not present

## 2015-07-21 DIAGNOSIS — D519 Vitamin B12 deficiency anemia, unspecified: Secondary | ICD-10-CM

## 2015-07-21 DIAGNOSIS — J90 Pleural effusion, not elsewhere classified: Secondary | ICD-10-CM

## 2015-07-21 DIAGNOSIS — Z170 Estrogen receptor positive status [ER+]: Secondary | ICD-10-CM

## 2015-07-21 DIAGNOSIS — C50511 Malignant neoplasm of lower-outer quadrant of right female breast: Secondary | ICD-10-CM | POA: Diagnosis not present

## 2015-07-21 DIAGNOSIS — G893 Neoplasm related pain (acute) (chronic): Secondary | ICD-10-CM

## 2015-07-21 DIAGNOSIS — D630 Anemia in neoplastic disease: Secondary | ICD-10-CM

## 2015-07-21 MED ORDER — PALBOCICLIB 125 MG PO CAPS: 125.0000 mg | ORAL_CAPSULE | Freq: Every day | ORAL | Status: DC

## 2015-07-21 NOTE — Telephone Encounter (Signed)
Prior auth request faxed to [facility] on 10/10. Fax # [phone]

## 2015-07-21 NOTE — Telephone Encounter (Signed)
Rx for Ibrance faxed to [facility] on 07/21/15

## 2015-07-21 NOTE — Telephone Encounter (Signed)
Gave and printed appt sched and avs fo rpt for OCT and NOV  °

## 2015-07-21 NOTE — Progress Notes (Signed)
[place]  [phone] [phone]  Clinic follow up Note   Patient Care Team: [name], MD as PCP - General  07/21/2015  CHIEF COMPLAINTS  follow up right breast cancer with bone metastasis     Breast cancer of lower-outer quadrant of right female breast ([place])   09/03/2008 Initial Diagnosis right breast cancer, T2N1Mo, stage IIA, ER+, PR+, HER2+ (ration 2.07). Pt declined surgery or other treatment.     05/20/2011 Tumor Marker right breast mass biopsy showed IDA, ER+/PR+/HER2- (ratio 1.37)   09/04/2011 Cancer Staging PET scan showed no distant mets   10/08/2014 Progression  lumbar spine MRI showed multiple bone metastasis , highly suspicious for  metastatic disease.   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 Increased size of right pleural effusion, indeterminate for malignant involvement of the pleural fluids.     HISTORY OF PRESENTING ILLNESS:  Margaret Rubio 74 y.o. female  Was referred by her primary care physician to discuss the management probable metastatic breast cancer.  She was initially diagnosed with right breast cancer in November 2009. 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 In December of 2009, she underwent a right axillary lymph node biopsy on 09/24/2008 which showed metastatic ductal carcinoma. (Case No: [id number]) The tumor was ER 100% positive, PR 0% negative, Ki67 - 22%. 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 The ratio of HER-2:CEP 17 was 2.07.  She decided to pursue nature therapy by diet and  exercise, and did not have surgery and other tranditional therapy for her breast cancer.   In May 2011 she underwent a mammogram that showed a 3.8 cm invasive duct carcinoma in the subareolar region of the right breast which had increased since November 2009.  The tumor was ER  99% positive,  PR 4% positive, HER-2 negative ( ratio 1.37).  She was seen by Dr. Estella Husk and Dr. Lucia Gaskins.  However, she failed multiple appointments and sought alternative medical care in [place]. This involved putting something on her right breast that caused some scarring.  She noticed the enlarged right breast mass and nipple inversion about one year ago. No pain, but some skin change. She developed left low back/buttock and hip and  pain in Nov 2015, was seen by PCP Dr. Jenny Reichmann who prescribed vicodin and tramadol and pain improved with meds. Due to the persistent pain, she underwent a lumber MRI  On 10/08/2014, which showed multiple bone metastasis.  She run out pain medication about a week ago,  But did not call for refill. She states her pain is not bad , but she appears to be uncomfortable when she stands up and walks.  She otherwise feels well overall,  Has good appetite and energy level. She lives alone and still functions well.  She comes in with her niece today.   Current therapy: Anastrozole 1 mg once daily started on 12/10/2014  INTERIM HISTORY: [name] returns for follow-up with her daughter to discuss her PET scan findings. She denies any new change since I saw her last week. She still has moderate headaches at left  orbit area, no double vision or blurry vision. She is scheduled to see an ophthalmologist to septum. She denies any significant abdominal pain, diarrhea, fever or chills.  MEDICAL HISTORY:  Past Medical History  Diagnosis Date  . 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 ASTHMA 01/31/2008  . BREAST CANCER, HX OF 11/14/2009  . BRONCHITIS, ACUTE 01/31/2008  . Cough 10/17/2009  . FATIGUE 11/14/2009  .  GANGLION CYST 05/01/2009  . HYPERLIPIDEMIA 05/21/2007  . HYPERTENSION 05/05/2007  . LOW BACK PAIN 05/21/2007  . MENOPAUSAL DISORDER 05/22/2010  . Pain in joint, lower leg 05/10/2008  . RASH-NONVESICULAR 05/01/2009  . SHOULDER PAIN, LEFT 11/14/2009  . 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 VITAMIN B12 DEFICIENCY 05/23/2010  . rt breast ca dx'd 2009    right breast    SURGICAL HISTORY: Past Surgical History  Procedure Laterality Date  . Removal of neck lump      benigh 1986  . Myomectomy      SOCIAL HISTORY: History   Social History  . Marital Status: Widowed    Spouse Name: N/A    Number of Children: 32, lives in [place].   . Years of Education: N/A   Occupational History  . Not on file.   Social History Main Topics  . Smoking status: Never Smoker   . Smokeless tobacco: Not on file  . Alcohol Use: No  . Drug Use: No  . Sexual Activity: Not on file   Other Topics Concern  . Not on file   Social History Narrative    FAMILY HISTORY: Family History  Problem Relation Age of Onset  . Asthma Other   . Diabetes Other   . Hypertension Other   . Hypertension Father   . Heart disease Father   Paternal aunt had cancer, unknown type. No other family history of malignancy   ALLERGIES:  has No Known Allergies.  MEDICATIONS:  Current Outpatient Prescriptions  Medication Sig Dispense Refill  . Ascorbic Acid (VITAMIN C) 100 MG CHEW Chew by mouth every morning.    Marland Kitchen aspirin EC 81 MG tablet Take 1 tablet (81 mg total) by mouth daily. 90 tablet 11  . ergocalciferol (VITAMIN D2) 50000 UNITS capsule Take 1 capsule (50,000 Units total) by mouth once a week. 4 capsule 3  . HYDROcodone-acetaminophen (NORCO/VICODIN) 5-325 MG per tablet Take 1 tablet by mouth every 6 (six) hours as needed for moderate pain. 60 tablet 0  . morphine (MS CONTIN) 15 MG 12 hr tablet Take 1 tablet (15 mg total) by mouth every 12 (twelve) hours. 60 tablet 0  . 0    0  . tiZANidine (ZANAFLEX) 4 MG tablet Take 1 tablet (4 mg  total) by mouth every 6 (six) hours as needed for muscle spasms. 40 tablet 1   No current facility-administered medications for this visit.    REVIEW OF SYSTEMS:   Constitutional: Denies fevers, chills or abnormal night sweats Eyes: Denies blurriness of vision, double vision or watery eyes Ears, nose, mouth, throat, and face: Denies mucositis or sore throat Respiratory: Denies cough, dyspnea or wheezes Cardiovascular: Denies palpitation, chest discomfort or lower extremity swelling Gastrointestinal:  Denies nausea, heartburn or change in bowel habits Skin: Denies abnormal skin rashes Lymphatics: Denies new lymphadenopathy or easy bruising Neurological:Denies numbness, tingling or new weaknesses Behavioral/Psych: Mood is stable, no new changes  All other systems were reviewed with the patient and are negative.  PHYSICAL EXAMINATION: ECOG PERFORMANCE STATUS: 1 - Symptomatic but completely ambulatory BP 147/59 mmHg  Pulse 68  Temp(Src) 98 F (36.7 C) (Oral)  Resp 20  Ht $R'5\' 5"'cc$  (1.651 m)  Wt 191 lb 3.2 oz (86.728 kg)  BMI 31.82 kg/m2  SpO2 100%  GENERAL:alert, no distress and comfortable SKIN: skin color, texture, turgor are normal, no rashes or significant lesions EYES: normal, conjunctiva are pink and non-injected, sclera clear OROPHARYNX:no exudate, no erythema and lips, buccal mucosa, and tongue normal  NECK: supple, thyroid normal size, non-tender, without nodularity LYMPH:  no palpable lymphadenopathy in the cervical, axillary or inguinal LUNGS: clear to auscultation and percussion with normal breathing effort HEART: regular rate & rhythm and no murmurs and no lower extremity edema ABDOMEN:abdomen soft, non-tender and normal bowel sounds Musculoskeletal:no cyanosis of digits and no clubbing  PSYCH: alert & oriented x 3 with fluent speech NEURO: no focal motor/sensory deficits Breasts: Breast inspection showed  Significantly enlarged right breast, compared to left breast, (+)  skin pigmentations , nipple inversion and a dry ulcer/ scar below the nipple, no discharge.  The right breast is softer than before, nontender , no discrete mass. Palpation of the  Left breasts and axilla revealed no obvious mass that I could appreciate.   LABORATORY DATA:  I have reviewed the data as listed CBC Latest Ref Rng 07/14/2015 03/25/2015 01/31/2015  WBC 3.9 - 10.3 10e3/uL 4.6 4.6 4.2  Hemoglobin 11.6 - 15.9 g/dL 9.4(L) 10.8(L) 10.2(L)  Hematocrit 34.8 - 46.6 % 29.2(L) 33.3(L) 31.1(L)  Platelets 145 - 400 10e3/uL 223 214 208    CMP Latest Ref Rng 07/14/2015 03/25/2015 01/31/2015  Glucose 70 - 140 mg/dl 86 83 83  BUN 7.0 - 26.0 mg/dL 8.3 8.4 12.2  Creatinine 0.6 - 1.1 mg/dL 0.7 0.7 0.6  Sodium 136 - 145 mEq/L 139 140 140  Potassium 3.5 - 5.1 mEq/L 4.1 4.1 3.8  Chloride 96 - 112 mEq/L - - -  CO2 22 - 29 mEq/L $Remove'28 25 24  'hIXMsSu$ Calcium 8.4 - 10.4 mg/dL 9.3 9.1 8.5  Total Protein 6.4 - 8.3 g/dL 6.7 6.6 6.4  Total Bilirubin 0.20 - 1.20 mg/dL 0.44 0.43 0.36  Alkaline Phos 40 - 150 U/L 67 67 85  AST 5 - 34 U/L 34 42(H) 19  ALT 0 - 55 U/L 15 32 9   CA 27.29  Status: Finalresult Visible to patient:  Not Released Nextappt: Today at 02:45 PM in Oncology ([facility] [facility] Nurse) Dx:  Breast cancer, right ([place])              Ref Range 4mo ago  43mo ago  58mo ago     CA 27.29 0 - 39 U/mL 317 (H) 402 (H) 414 (H)          PATHOLOGY REPORT  RIGHT BREAST MASS, 6 O'CLOCK, NEEDLE CORE BIOPSIES: 09/03/2008 MICROSCOPIC FOCUS OF DUCTAL CARCINOMA IN SITU ASSOCIATED WITH MICROSCOPIC FOCUS OF MICROINVASION. SEE COMMENT.  LYMPH NODE, RIGHT AXILLARY, NEEDLE CORE BIOPSIES: METASTATIC DUCTAL CARCINOMA.  09/24/2008  COMMENT There are needle core biopsies of lymph node showing partial replacement by metastatic adenocarcinoma consistent with metastasis from a breast primary, metastatic ductal carcinoma. Since the breast prognostic profile is not performed on  the previous material because of limited invasive tumor ([id number]), the breast prognostic profile will be performed on the tumor in the lymph node and reported in a separate report. Interpretation HER-2/NEU BY CISH -- SHOWS AMPLIFICATION BY CISH ANALYSIS. THE RATIO OF HER-2: CEP 17 SIGNALS WAS 2.07  Diagnosis 05/20/2011 Breast, right, needle core biopsy - INVASIVE DUCTAL CARCINOMA, SEE COMMENT. -  LYMPHOVASCULAR INVASION PRESENT. Estrogen Receptor (Negative, <1%): 99%, STRONG STAINING INTENSITY Progesterone Receptor (Negative, <1%): 4%, STRONG HER-2/NEU BY CISH - NO AMPLIFICATION OF HER-2 DETECTED. THE RATIO OF HER-2: CEP 17 SIGNALS WAS 1.38.  Breast, right, needle core biopsy, 6:30 o'clock 12/13/2014 - INVASIVE MAMMARY CARCINOMA. - SEE MICROSCOPIC DESCRIPTION. Microscopic Comment The features favor Grade II invasive ductal carcinoma. A breast prognostic profile will be performed. Dr. Donato Heinz agrees. The results were called to [facility] on 12/16/14. (JDP:ds 12/16/14) ADDENDUM: Immunohistochemistry for E-cadherin is performed and shows diffuse strong positivity consistent with invasive ductal carcinoma. Estrogen Receptor: 100%, POSITIVE, STRONG STAINING INTENSITY Progesterone Receptor: 2%, POSITIVE, STRONG STAINING INTENSITY Proliferation Marker Ki67: 27% HER-2/NEU BY CISH - NEGATIVE. RESULT RATIO OF HER2: CEP 17 SIGNALS 1.45 AVERAGE HER2 COPY NUMBER PER CELL 2.25   RADIOGRAPHIC STUDIES: I have personally reviewed the radiological images as listed and agreed with the findings in the report.  PET 07/17/2015 IMPRESSION: 1. Suspected active diverticulitis along the sigmoid colon, with focal mesenteric stranding and a small locule of gas which is either within an inflamed diverticulum or a micro perforation. No overt free air. 2. Overall similar appearance of multifocal right breast cancer with extensive osseous metastatic disease. The right axillary lymph  nodes are less notable that there is a borderline hypermetabolic left axillary lymph node currently. 3. Increasing size of the right pleural effusion, now moderate. Appearance indeterminate for malignant involvement of the pleural fluid. 4. Faintly increased activity in a right infrahilar lymph node which could represent early metastatic spread. 5. Other imaging findings of potential clinical significance: Chronic left maxillary sinusitis. Low-density blood pool suggests anemia. Left adrenal adenoma. These results will be called to the ordering clinician or representative by the Radiologist Assistant, and communication documented in the PACS or zVision Dashboard.  Brain MRI with and without contrast on 06/27/2015, at [facility] -In determinate, ill-defined soft tissue infiltration in the left orbit with questionable enhancement, further evaluation with CT orbits with contrast is recommended to the cyst in ruling out metastasis. -No intracranial metastasis -Diffusely hypoechoic intense bone marrow signal throughout the skull base    ASSESSMENT & PLAN:   Margaret Rubio is a 74 year old  African-American female, who was diagnosed with node positive right breast  Invasive adenocarcinoma in  2009, untreated,  Now presents with low back pain and lumbar spine MRI is highly suspicious for bone metastasis.  1.  Right breast IDA,  Strongly ER positive,  PR weakly positive,  HER-2 positive on first biopsy , but negative on second and third  biopsy 3 and 6 years later, with diffuse bone metastatic now, T4NxM1 stage IV  - it has been over 6 years since her initial diagnosis. Her lumbar MRI is very suspicious for bone metastasis.  Her disease course is more consistent with ER positive , HER-2 negative disease. - I discussed her PET scan results with her and her daughter. 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 -I recommend bone biopsy to  confirm the metastasis, and also repeat ER/PR/HER-2 to guide further therapy. Her daughter strongly encouraged her to consider biopsy but she again declined bone biopsy after long conversation.  -I discussed her recent breast biopsy results. Again this is a ER/PR strongly positive invasive ductal carcinoma, HER-2 is negative on this biopsy. Giving the only marginal positive HER-2 on first biopsy, and the natural history of disease, I think this is most consistent with a HER-2  negative disease. - I discussed that her disease at this stage is unlikely curable , I would not offer surgery. The goal of treatment is palliative. - Giving the bone metastasis only, I recommend endocrine therapy as a first-line treatment. I discussed aromatase inhibitors with or without palbociclib, potential side effects of each medication were discussed with patient, which includes but not limited to, fatigue, hot flash, vaginal dryness, musculoskeletal pain and stiffness, cytopenia, risk of infection. -She finally agreed to start anastrozole, and is tolerating well, will continue. -I personally reviewed her PET scanning images with patient and her daughter. She has stable disease overall, increased right pleural effusion is undetermined, does not appear to be hypermetabolic on PET scan. -Giving the suboptimal response on her scan, I recommend her to add Ibrance to anastrozole, the benefit of prolonging duration of response, and side effects, especially cytopenia and neutropenic fever with discussed with her in details. She agrees to proceed, and commits to return every 2 weeks for her lab work in the first 2 months.   2. Left frontal headaches,  -I reviewed her MRI report from [facility], which showed a ill-defined soft tissue in the left orbit, suspicious for bone metastasis -She is scheduled to see a ophthalmologist this afternoon -I discussed the option of palliative radiation, or adding Ibrance for better  systemic control, which will probably help with her headaches -I encouraged her to continue use Tylenol, ibuprofen and tramadol as needed.  3. Right pleural effusion -Etiology indeterminate, need to rule out malignant effusion -I'll refer her to interventional radiology for thoracentesis   3. Low back/ hip pain,  Secondary to metastatic cancer -this has resolved since she started anastrozole -We'll start on Xgeva for her bone metastasis. Benefit and side effects, especially jaw necrosis, were discussed with patient. She is going to have her denture down just months, we'll start injection next month.  4. Anemia -Likely secondary to her breast cancer, bone metastases and the B12 deficiency -Slightly improved after weekly B12 injections. Continue B12 monthly for now -No need for transfusion. We'll follow up closely.  Plan: - continue Arimidex, add Ibrance, I sent a prescription to [facility] today -She is seeing a ophthalmologist this afternoon -I'll see her back in 3 weeks, or 2 weeks after her first dose of Ibrance   All questions were answered. The patient knows to call the clinic with any problems, questions or concerns. I spent 25 minutes counseling the patient face to face. The total time spent in the appointment was 30 minutes and more than 50% was on counseling.     Truitt Merle, MD 07/21/2015

## 2015-07-23 NOTE — Telephone Encounter (Signed)
PA approved for Ibrance from 07/21/15 to 07/22/16. Auth # [id number].   Will follow up with [facility] for [place].    Thanks,  Montel Clock, PharmD, [facility]

## 2015-07-28 ENCOUNTER — Ambulatory Visit (HOSPITAL_COMMUNITY): Payer: Medicare Other

## 2015-07-31 ENCOUNTER — Encounter: Payer: Self-pay | Admitting: Pharmacist

## 2015-07-31 DIAGNOSIS — Z5111 Encounter for antineoplastic chemotherapy: Secondary | ICD-10-CM | POA: Insufficient documentation

## 2015-07-31 NOTE — Progress Notes (Signed)
Oral Chemotherapy Pharmacist Encounter   I spoke with patient for overview of new oral chemotherapy medication: Ibrance. Pt is doing well. The prescription has been sent to the [facility] and patient has picked up and started the medication on Tuesday 10/18.   Counseled patient on administration, dosing, side effects, safe handling, and monitoring. Side effects include but not limited to: fatigue, GI toxicity, headache, neuropathy and decreased blood counts.  Margaret Rubio voiced understanding and appreciation. She knows we will be checking her CBCs periodically to ensure proper dosing and make any adjustments if needed.   All questions answered.  Will follow up in 1-2 weeks for adherence and toxicity management. Pt returns on 08/12/15 for labs and Follow up visit with Dr. Burr Medico. This will be Day 21 of cycle 1  Thank you,  Montel Clock, PharmD, [facility]

## 2015-08-01 ENCOUNTER — Ambulatory Visit (HOSPITAL_COMMUNITY)
Admission: RE | Admit: 2015-08-01 | Discharge: 2015-08-01 | Disposition: A | Discharge status: Home / Self Care | Payer: Medicare Other | Source: Ambulatory Visit | Attending: Radiology | Admitting: Radiology

## 2015-08-01 ENCOUNTER — Ambulatory Visit (HOSPITAL_COMMUNITY)
Admission: RE | Admit: 2015-08-01 | Discharge: 2015-08-01 | Disposition: A | Discharge status: Home / Self Care | Payer: Medicare Other | Source: Ambulatory Visit | Attending: Hematology | Admitting: Hematology

## 2015-08-01 DIAGNOSIS — J90 Pleural effusion, not elsewhere classified: Secondary | ICD-10-CM | POA: Insufficient documentation

## 2015-08-01 DIAGNOSIS — C801 Malignant (primary) neoplasm, unspecified: Secondary | ICD-10-CM | POA: Diagnosis not present

## 2015-08-01 DIAGNOSIS — C782 Secondary malignant neoplasm of pleura: Secondary | ICD-10-CM | POA: Diagnosis not present

## 2015-08-01 DIAGNOSIS — Z9889 Other specified postprocedural states: Secondary | ICD-10-CM | POA: Diagnosis not present

## 2015-08-01 DIAGNOSIS — J910 Malignant pleural effusion: Secondary | ICD-10-CM | POA: Diagnosis not present

## 2015-08-01 DIAGNOSIS — C50511 Malignant neoplasm of lower-outer quadrant of right female breast: Secondary | ICD-10-CM

## 2015-08-01 LAB — BODY FLUID CELL COUNT WITH DIFFERENTIAL
Eos, Fluid: 0 %
Lymphs, Fluid: 58 %
Monocyte-Macrophage-Serous Fluid: 40 % — ABNORMAL LOW (ref 50–90)
NEUTROPHIL FLUID: 2 % (ref 0–25)
Total Nucleated Cell Count, Fluid: 1099 cu mm — ABNORMAL HIGH (ref 0–1000)

## 2015-08-01 LAB — GLUCOSE, SEROUS FLUID: Glucose, Fluid: 91 mg/dL

## 2015-08-01 LAB — PROTEIN, BODY FLUID: Total protein, fluid: 4 g/dL

## 2015-08-01 LAB — LACTATE DEHYDROGENASE, PLEURAL OR PERITONEAL FLUID: LD, Fluid: 127 U/L — ABNORMAL HIGH (ref 3–23)

## 2015-08-01 NOTE — Procedures (Signed)
Successful US guided right thoracentesis. Yielded 250 ml of serous fluid. Pt tolerated procedure well. No immediate complications.  Specimen was sent for labs. CXR ordered.  Tsosie Billing D PA-C 08/01/2015 11:59 AM

## 2015-08-07 ENCOUNTER — Telehealth: Payer: Self-pay | Admitting: Pharmacist

## 2015-08-07 LAB — OTHER BODY FLUID CHEMISTRY: MISCELLANEOUS TEST RESULTS: 11

## 2015-08-07 NOTE — Telephone Encounter (Signed)
08/07/15: called patient for follow up on Ibrance medication. No answer. I left voicemail for patient to call back with questions or issues.   I will follow up again with patient next week  Thank you,  Montel Clock, PharmD, [facility]

## 2015-08-08 ENCOUNTER — Ambulatory Visit (HOSPITAL_COMMUNITY): Payer: Medicare Other

## 2015-08-11 ENCOUNTER — Telehealth: Payer: Self-pay | Admitting: Hematology

## 2015-08-11 DIAGNOSIS — H0589 Other disorders of orbit: Secondary | ICD-10-CM | POA: Diagnosis not present

## 2015-08-11 DIAGNOSIS — H269 Unspecified cataract: Secondary | ICD-10-CM | POA: Diagnosis not present

## 2015-08-11 DIAGNOSIS — Z853 Personal history of malignant neoplasm of breast: Secondary | ICD-10-CM | POA: Diagnosis not present

## 2015-08-11 NOTE — Telephone Encounter (Signed)
pt neice call to r/s appt-gave r/s time & date

## 2015-08-12 ENCOUNTER — Other Ambulatory Visit: Payer: Medicare Other

## 2015-08-12 ENCOUNTER — Ambulatory Visit: Payer: Medicare Other | Admitting: Hematology

## 2015-08-15 ENCOUNTER — Ambulatory Visit (HOSPITAL_BASED_OUTPATIENT_CLINIC_OR_DEPARTMENT_OTHER): Payer: Medicare Other

## 2015-08-15 ENCOUNTER — Ambulatory Visit: Payer: Medicare Other

## 2015-08-15 ENCOUNTER — Other Ambulatory Visit: Payer: Medicare Other

## 2015-08-15 ENCOUNTER — Telehealth: Payer: Self-pay | Admitting: Oncology

## 2015-08-15 ENCOUNTER — Ambulatory Visit: Payer: Medicare Other | Admitting: Hematology

## 2015-08-15 ENCOUNTER — Other Ambulatory Visit (HOSPITAL_BASED_OUTPATIENT_CLINIC_OR_DEPARTMENT_OTHER): Payer: Medicare Other

## 2015-08-15 ENCOUNTER — Ambulatory Visit (HOSPITAL_BASED_OUTPATIENT_CLINIC_OR_DEPARTMENT_OTHER): Payer: Medicare Other | Admitting: Hematology

## 2015-08-15 ENCOUNTER — Encounter: Payer: Self-pay | Admitting: Hematology

## 2015-08-15 VITALS — BP 131/57 | HR 72 | Temp 98.2°F | Resp 18 | Ht 65.0 in | Wt 191.8 lb

## 2015-08-15 DIAGNOSIS — J910 Malignant pleural effusion: Secondary | ICD-10-CM

## 2015-08-15 DIAGNOSIS — C50511 Malignant neoplasm of lower-outer quadrant of right female breast: Secondary | ICD-10-CM

## 2015-08-15 DIAGNOSIS — C7951 Secondary malignant neoplasm of bone: Secondary | ICD-10-CM

## 2015-08-15 DIAGNOSIS — E538 Deficiency of other specified B group vitamins: Secondary | ICD-10-CM | POA: Diagnosis not present

## 2015-08-15 DIAGNOSIS — D649 Anemia, unspecified: Secondary | ICD-10-CM

## 2015-08-15 DIAGNOSIS — R51 Headache: Secondary | ICD-10-CM

## 2015-08-15 DIAGNOSIS — C50911 Malignant neoplasm of unspecified site of right female breast: Secondary | ICD-10-CM | POA: Diagnosis not present

## 2015-08-15 DIAGNOSIS — Z170 Estrogen receptor positive status [ER+]: Secondary | ICD-10-CM | POA: Diagnosis not present

## 2015-08-15 DIAGNOSIS — G893 Neoplasm related pain (acute) (chronic): Secondary | ICD-10-CM

## 2015-08-15 LAB — COMPREHENSIVE METABOLIC PANEL (CC13)
ALBUMIN: 3.5 g/dL (ref 3.5–5.0)
ALT: 16 U/L (ref 0–55)
ANION GAP: 9 meq/L (ref 3–11)
AST: 31 U/L (ref 5–34)
Alkaline Phosphatase: 69 U/L (ref 40–150)
BUN: 10.1 mg/dL (ref 7.0–26.0)
CO2: 21 mEq/L — ABNORMAL LOW (ref 22–29)
Calcium: 8.9 mg/dL (ref 8.4–10.4)
Chloride: 107 mEq/L (ref 98–109)
Creatinine: 0.8 mg/dL (ref 0.6–1.1)
EGFR: 83 mL/min/{1.73_m2} — AB (ref >90)
GLUCOSE: 106 mg/dL (ref 70–140)
Potassium: 4.1 mEq/L (ref 3.5–5.1)
SODIUM: 137 meq/L (ref 136–145)
TOTAL PROTEIN: 6.5 g/dL (ref 6.4–8.3)
Total Bilirubin: 0.3 mg/dL (ref 0.20–1.20)

## 2015-08-15 LAB — CBC WITH DIFFERENTIAL/PLATELET
BASO%: 0.4 % (ref 0.0–2.0)
Basophils Absolute: 0 10*3/uL (ref 0.0–0.1)
EOS ABS: 0 10*3/uL (ref 0.0–0.5)
EOS%: 0.6 % (ref 0.0–7.0)
HCT: 27 % — ABNORMAL LOW (ref 34.8–46.6)
HGB: 8.6 g/dL — ABNORMAL LOW (ref 11.6–15.9)
LYMPH%: 48.6 % (ref 14.0–49.7)
MCH: 29.4 pg (ref 25.1–34.0)
MCHC: 31.9 g/dL (ref 31.5–36.0)
MCV: 92.2 fL (ref 79.5–101.0)
MONO#: 0.3 10*3/uL (ref 0.1–0.9)
MONO%: 10.8 % (ref 0.0–14.0)
NEUT%: 39.6 % (ref 38.4–76.8)
NEUTROS ABS: 1.1 10*3/uL — AB (ref 1.5–6.5)
PLATELETS: 171 10*3/uL (ref 145–400)
RBC: 2.93 10*6/uL — AB (ref 3.70–5.45)
RDW: 15.5 % — AB (ref 11.2–14.5)
WBC: 2.8 10*3/uL — AB (ref 3.9–10.3)
lymph#: 1.3 10*3/uL (ref 0.9–3.3)

## 2015-08-15 MED ORDER — DENOSUMAB 120 MG/1.7ML ~~LOC~~ SOLN
120.0000 mg | Freq: Once | SUBCUTANEOUS | Status: AC
  Administered 2015-08-15: 120 mg via SUBCUTANEOUS
  Filled 2015-08-15: qty 1.7

## 2015-08-15 MED ORDER — CYANOCOBALAMIN 1000 MCG/ML IJ SOLN
1000.0000 ug | Freq: Once | INTRAMUSCULAR | Status: AC
  Administered 2015-08-15: 1000 ug via INTRAMUSCULAR

## 2015-08-15 NOTE — Patient Instructions (Signed)
Take Calcium with Vitamin D supplements two times daily.  Denosumab injection What is this medicine? DENOSUMAB (den oh sue mab) slows bone breakdown. Prolia is used to treat osteoporosis in women after menopause and in men. Delton See is used to prevent bone fractures and other bone problems caused by cancer bone metastases. Delton See is also used to treat giant cell tumor of the bone. This medicine may be used for other purposes; ask your health care provider or pharmacist if you have questions. What should I tell my health care provider before I take this medicine? They need to know if you have any of these conditions: -dental disease -eczema -infection or history of infections -kidney disease or on dialysis -low blood calcium or vitamin D -malabsorption syndrome -scheduled to have surgery or tooth extraction -taking medicine that contains denosumab -thyroid or parathyroid disease -an unusual reaction to denosumab, other medicines, foods, dyes, or preservatives -pregnant or trying to get pregnant -breast-feeding How should I use this medicine? This medicine is for injection under the skin. It is given by a health care professional in a hospital or clinic setting. If you are getting Prolia, a special MedGuide will be given to you by the pharmacist with each prescription and refill. Be sure to read this information carefully each time. For Prolia, talk to your pediatrician regarding the use of this medicine in children. Special care may be needed. For Delton See, talk to your pediatrician regarding the use of this medicine in children. While this drug may be prescribed for children as young as 13 years for selected conditions, precautions do apply. Overdosage: If you think you have taken too much of this medicine contact a poison control center or emergency room at once. NOTE: This medicine is only for you. Do not share this medicine with others. What if I miss a dose? It is important not to miss your  dose. Call your doctor or health care professional if you are unable to keep an appointment. What may interact with this medicine? Do not take this medicine with any of the following medications: -other medicines containing denosumab This medicine may also interact with the following medications: -medicines that suppress the immune system -medicines that treat cancer -steroid medicines like prednisone or cortisone This list may not describe all possible interactions. Give your health care provider a list of all the medicines, herbs, non-prescription drugs, or dietary supplements you use. Also tell them if you smoke, drink alcohol, or use illegal drugs. Some items may interact with your medicine. What should I watch for while using this medicine? Visit your doctor or health care professional for regular checks on your progress. Your doctor or health care professional may order blood tests and other tests to see how you are doing. Call your doctor or health care professional if you get a cold or other infection while receiving this medicine. Do not treat yourself. This medicine may decrease your body's ability to fight infection. You should make sure you get enough calcium and vitamin D while you are taking this medicine, unless your doctor tells you not to. Discuss the foods you eat and the vitamins you take with your health care professional. See your dentist regularly. Brush and floss your teeth as directed. Before you have any dental work done, tell your dentist you are receiving this medicine. Do not become pregnant while taking this medicine or for 5 months after stopping it. Women should inform their doctor if they wish to become pregnant or think they might  be pregnant. There is a potential for serious side effects to an unborn child. Talk to your health care professional or pharmacist for more information. What side effects may I notice from receiving this medicine? Side effects that you  should report to your doctor or health care professional as soon as possible: -allergic reactions like skin rash, itching or hives, swelling of the face, lips, or tongue -breathing problems -chest pain -fast, irregular heartbeat -feeling faint or lightheaded, falls -fever, chills, or any other sign of infection -muscle spasms, tightening, or twitches -numbness or tingling -skin blisters or bumps, or is dry, peels, or red -slow healing or unexplained pain in the mouth or jaw -unusual bleeding or bruising Side effects that usually do not require medical attention (Report these to your doctor or health care professional if they continue or are bothersome.): -muscle pain -stomach upset, gas This list may not describe all possible side effects. Call your doctor for medical advice about side effects. You may report side effects to FDA at 1-800-FDA-1088. Where should I keep my medicine? This medicine is only given in a clinic, doctor's office, or other health care setting and will not be stored at home. NOTE: This sheet is a summary. It may not cover all possible information. If you have questions about this medicine, talk to your doctor, pharmacist, or health care provider.    2016, Elsevier/Gold Standard. (2012-03-27 12:37:47)

## 2015-08-15 NOTE — Telephone Encounter (Signed)
Gave patient avs report and appointments for November and December. Date for lab 11/10, 11/28 and 12/8 per YF - no lab needed 11/14.

## 2015-08-15 NOTE — Progress Notes (Signed)
Margaret Rubio  [phone] Fax:([phone]  Clinic follow up Note   Patient Care Team: [name], MD as PCP - General  08/15/2015  CHIEF COMPLAINTS  follow up right breast cancer with bone metastasis     Breast cancer of lower-outer quadrant of right female breast ([place])   09/03/2008 Initial Diagnosis right breast cancer, T2N1Mo, stage IIA, ER+, PR+, HER2+ (ration 2.07). Pt declined surgery or other treatment.     05/20/2011 Tumor Marker right breast mass biopsy showed IDA, ER+/PR+/HER2- (ratio 1.37)   09/04/2011 Cancer Staging PET scan showed no distant mets   10/08/2014 Progression  lumbar spine MRI showed multiple bone metastasis , highly suspicious for  metastatic disease.   12/13/2014 Pathology Results Right breast needle biopsy showed invasive ductal carcinoma, ER 100% positive, PR 2% positive, HER-2 negative with ratio 1.45 and copy number 2.25   12/14/2014 -  Anti-estrogen oral therapy Arimidex $RemoveBeforeDE'1mg'pGsmyHayibGBTlo$  daily    07/17/2015 Imaging PET scan showed overall similar appearance of multifocal right breast cancer with extensive bone metastasis. Increased size of right pleural effusion, indeterminate for malignant involvement of the pleural fluids.     HISTORY OF PRESENTING ILLNESS:  Margaret Rubio 74 y.o. female  Was referred by her primary care physician to discuss the management probable metastatic breast cancer.  She was initially diagnosed with right breast cancer in November 2009. 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 In December of 2009, she underwent a right axillary lymph node biopsy on 09/24/2008 which showed metastatic ductal carcinoma. (Case No: [id number]) The tumor was ER 100% positive, PR 0% negative, Ki67 - 22%. 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 The ratio of HER-2:CEP 17 was 2.07.  She decided to pursue nature therapy by diet and  exercise, and did not have surgery and other tranditional therapy for her breast cancer.   In May 2011 she underwent a mammogram that showed a 3.8 cm invasive duct carcinoma in the subareolar region of the right breast which had increased since November 2009.  The tumor was ER  99% positive,  PR 4% positive, HER-2 negative ( ratio 1.37).  She was seen by Dr. Estella Husk and Dr. Lucia Gaskins.  However, she failed multiple appointments and sought alternative medical care in [place]. This involved putting something on her right breast that caused some scarring.  She noticed the enlarged right breast mass and nipple inversion about one year ago. No pain, but some skin change. She developed left low back/buttock and hip and  pain in Nov 2015, was seen by PCP Dr. Jenny Reichmann who prescribed vicodin and tramadol and pain improved with meds. Due to the persistent pain, she underwent a lumber MRI  On 10/08/2014, which showed multiple bone metastasis.  She run out pain medication about a week ago,  But did not call for refill. She states her pain is not bad , but she appears to be uncomfortable when she stands up and walks.  She otherwise feels well overall,  Has good appetite and energy level. She lives alone and still functions well.  She comes in with her niece today.   Current therapy:  1.Anastrozole 1 mg once daily started on 12/10/2014 2. Ibrance added on 07/29/15 3. Xgeva started on 08/15/2015  INTERIM HISTORY: [name] returns for follow-up. She is accompanied by her 2 daughters to the clinic today. She was seen by a  ophthalmologist at [facility] earlier this week, who recommended her to consider radiation for the left orbit bone lesion. She states her headache has been slightly better lately, tolerable. She denies any double vision or blurry vision or other neurological symptoms. She started Ibrance 2 weeks ago, mild fatigue, otherwise she is tolerating well. She denies any fever or chills, no bleeding. No other  new complaints. She lives alone, functions well at home.   MEDICAL HISTORY:  Past Medical History  Diagnosis Date  . 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 ASTHMA 01/31/2008  . BREAST CANCER, HX OF 11/14/2009  . BRONCHITIS, ACUTE 01/31/2008  . Cough 10/17/2009  . FATIGUE 11/14/2009  . GANGLION CYST 05/01/2009  . HYPERLIPIDEMIA 05/21/2007  . HYPERTENSION 05/05/2007  . LOW BACK PAIN 05/21/2007  . MENOPAUSAL DISORDER 05/22/2010  . Pain in joint, lower leg 05/10/2008  . RASH-NONVESICULAR 05/01/2009  . SHOULDER PAIN, LEFT 11/14/2009  . 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 VITAMIN B12 DEFICIENCY 05/23/2010  . rt breast ca dx'd 2009    right breast    SURGICAL HISTORY: Past Surgical History  Procedure Laterality Date  . Removal of neck lump      benigh 1986  . Myomectomy      SOCIAL HISTORY: History   Social History  . Marital Status: Widowed    Spouse Name: N/A    Number of Children: 24, lives in [place].   . Years of Education: N/A   Occupational History  . Not on file.   Social History Main Topics  . Smoking status: Never Smoker   . Smokeless tobacco: Not on file  . Alcohol Use: No  . Drug Use: No  . Sexual Activity: Not on file   Other Topics Concern  . Not on file   Social History Narrative    FAMILY HISTORY: Family History  Problem Relation Age of Onset  . Asthma Other   . Diabetes Other   . Hypertension Other   . Hypertension Father   . Heart disease Father   Paternal aunt had cancer, unknown type. No other family history of malignancy   ALLERGIES:  has No Known Allergies.  MEDICATIONS:  Current Outpatient Prescriptions  Medication Sig Dispense Refill  . Ascorbic Acid (VITAMIN C) 100 MG CHEW Chew by mouth every morning.    Marland Kitchen aspirin EC 81 MG tablet Take 1 tablet (81 mg total) by mouth daily. 90 tablet 11  . ergocalciferol (VITAMIN D2) 50000 UNITS capsule Take 1 capsule (50,000 Units total) by mouth once a week. 4 capsule 3  . HYDROcodone-acetaminophen (NORCO/VICODIN) 5-325  MG per tablet Take 1 tablet by mouth every 6 (six) hours as needed for moderate pain. 60 tablet 0  . morphine (MS CONTIN) 15 MG 12 hr tablet Take 1 tablet (15 mg total) by mouth every 12 (twelve) hours. 60 tablet 0  . 0    0  . tiZANidine (ZANAFLEX) 4 MG tablet Take 1 tablet (4 mg total) by mouth every 6 (six) hours as needed for muscle spasms. 40 tablet 1   No current facility-administered medications for this visit.    REVIEW OF SYSTEMS:   Constitutional: Denies fevers, chills or abnormal night sweats Eyes: Denies blurriness of vision, double vision or watery eyes Ears, nose, mouth, throat, and face: Denies mucositis or sore throat Respiratory: Denies cough, dyspnea or wheezes Cardiovascular: Denies palpitation, chest discomfort or lower extremity swelling Gastrointestinal:  Denies nausea, heartburn or change in bowel habits Skin: Denies abnormal skin rashes Lymphatics: Denies new lymphadenopathy  or easy bruising Neurological:Denies numbness, tingling or new weaknesses Behavioral/Psych: Mood is stable, no new changes  All other systems were reviewed with the patient and are negative.  PHYSICAL EXAMINATION: ECOG PERFORMANCE STATUS: 1 - Symptomatic but completely ambulatory BP 131/57 mmHg  Pulse 72  Temp(Src) 98.2 F (36.8 C) (Oral)  Resp 18  Ht $R'5\' 5"'Yn$  (1.651 m)  Wt 191 lb 12.8 oz (87 kg)  BMI 31.92 kg/m2  SpO2 99%  GENERAL:alert, no distress and comfortable SKIN: skin color, texture, turgor are normal, no rashes or significant lesions EYES: normal, conjunctiva are pink and non-injected, sclera clear OROPHARYNX:no exudate, no erythema and lips, buccal mucosa, and tongue normal  NECK: supple, thyroid normal size, non-tender, without nodularity LYMPH:  no palpable lymphadenopathy in the cervical, axillary or inguinal LUNGS: clear to auscultation and percussion with normal breathing effort HEART: regular rate & rhythm and no murmurs and no lower extremity edema ABDOMEN:abdomen  soft, non-tender and normal bowel sounds Musculoskeletal:no cyanosis of digits and no clubbing  PSYCH: alert & oriented x 3 with fluent speech NEURO: no focal motor/sensory deficits Breasts: Breast inspection showed  Significantly enlarged right breast, compared to left breast, (+) skin pigmentations , nipple inversion and a dry ulcer/ scar below the nipple, no discharge.  The right breast is softer than before, nontender , no discrete mass. Palpation of the  Left breasts and axilla revealed no obvious mass that I could appreciate.   LABORATORY DATA:  I have reviewed the data as listed CBC Latest Ref Rng 08/15/2015 07/14/2015 03/25/2015  WBC 3.9 - 10.3 10e3/uL 2.8(L) 4.6 4.6  Hemoglobin 11.6 - 15.9 g/dL 8.6(L) 9.4(L) 10.8(L)  Hematocrit 34.8 - 46.6 % 27.0(L) 29.2(L) 33.3(L)  Platelets 145 - 400 10e3/uL 171 223 214    CMP Latest Ref Rng 08/15/2015 07/14/2015 03/25/2015  Glucose 70 - 140 mg/dl 106 86 83  BUN 7.0 - 26.0 mg/dL 10.1 8.3 8.4  Creatinine 0.6 - 1.1 mg/dL 0.8 0.7 0.7  Sodium 136 - 145 mEq/L 137 139 140  Potassium 3.5 - 5.1 mEq/L 4.1 4.1 4.1  Chloride 96 - 112 mEq/L - - -  CO2 22 - 29 mEq/L 21(L) 28 25  Calcium 8.4 - 10.4 mg/dL 8.9 9.3 9.1  Total Protein 6.4 - 8.3 g/dL 6.5 6.7 6.6  Total Bilirubin 0.20 - 1.20 mg/dL 0.30 0.44 0.43  Alkaline Phos 40 - 150 U/L 69 67 67  AST 5 - 34 U/L 31 34 42(H)  ALT 0 - 55 U/L 16 15 32   Results for ATIYAH, BAUER (MRN [id number]) as of 08/15/2015 22:14  Ref. Range 11/29/2014 08:06 12/27/2014 08:24 01/31/2015 08:14 03/25/2015 08:10 07/14/2015 13:36  CA 27.29 Latest Ref Range: 0-39 U/mL 384 (H) 414 (H) 402 (H) 317 (H) 440 (H)    PATHOLOGY REPORT  RIGHT BREAST MASS, 6 O'CLOCK, NEEDLE CORE BIOPSIES: 09/03/2008 MICROSCOPIC FOCUS OF DUCTAL CARCINOMA IN SITU ASSOCIATED WITH MICROSCOPIC FOCUS OF MICROINVASION. SEE COMMENT.  LYMPH NODE, RIGHT AXILLARY, NEEDLE CORE BIOPSIES: METASTATIC DUCTAL CARCINOMA.  09/24/2008  COMMENT There are needle core  biopsies of lymph node showing partial replacement by metastatic adenocarcinoma consistent with metastasis from a breast primary, metastatic ductal carcinoma. Since the breast prognostic profile is not performed on the previous material because of limited invasive tumor ([id number]), the breast prognostic profile will be performed on the tumor in the lymph node and reported in a separate report. Interpretation HER-2/NEU BY CISH -- SHOWS AMPLIFICATION BY CISH ANALYSIS. THE RATIO OF HER-2: CEP 17 SIGNALS  WAS 2.07  Diagnosis 05/20/2011 Breast, right, needle core biopsy - INVASIVE DUCTAL CARCINOMA, SEE COMMENT. - LYMPHOVASCULAR INVASION PRESENT. Estrogen Receptor (Negative, <1%): 99%, STRONG STAINING INTENSITY Progesterone Receptor (Negative, <1%): 4%, STRONG HER-2/NEU BY CISH - NO AMPLIFICATION OF HER-2 DETECTED. THE RATIO OF HER-2: CEP 17 SIGNALS WAS 1.38.  Breast, right, needle core biopsy, 6:30 o'clock 12/13/2014 - INVASIVE MAMMARY CARCINOMA. - SEE MICROSCOPIC DESCRIPTION. Microscopic Comment The features favor Grade II invasive ductal carcinoma. A breast prognostic profile will be performed. Dr. Donato Heinz agrees. The results were called to [facility] on 12/16/14. (JDP:ds 12/16/14) ADDENDUM: Immunohistochemistry for E-cadherin is performed and shows diffuse strong positivity consistent with invasive ductal carcinoma. Estrogen Receptor: 100%, POSITIVE, STRONG STAINING INTENSITY Progesterone Receptor: 2%, POSITIVE, STRONG STAINING INTENSITY Proliferation Marker Ki67: 27% HER-2/NEU BY CISH - NEGATIVE. RESULT RATIO OF HER2: CEP 17 SIGNALS 1.45 AVERAGE HER2 COPY NUMBER PER CELL 2.25  Diagnosis 08/01/2015 PLEURAL FLUID, RIGHT(SPECIMEN 1 OF 1 COLLECTED 08/01/15): METASTATIC CARCINOMA CONSISTENT WITH BREAST PRIMARY.   RADIOGRAPHIC STUDIES: I have personally reviewed the radiological images as listed and agreed with the findings in the report.  PET  07/17/2015 IMPRESSION: 1. Suspected active diverticulitis along the sigmoid colon, with focal mesenteric stranding and a small locule of gas which is either within an inflamed diverticulum or a micro perforation. No overt free air. 2. Overall similar appearance of multifocal right breast cancer with extensive osseous metastatic disease. The right axillary lymph nodes are less notable that there is a borderline hypermetabolic left axillary lymph node currently. 3. Increasing size of the right pleural effusion, now moderate. Appearance indeterminate for malignant involvement of the pleural fluid. 4. Faintly increased activity in a right infrahilar lymph node which could represent early metastatic spread. 5. Other imaging findings of potential clinical significance: Chronic left maxillary sinusitis. Low-density blood pool suggests anemia. Left adrenal adenoma. These results will be called to the ordering clinician or representative by the Radiologist Assistant, and communication documented in the PACS or zVision Dashboard.  Brain MRI with and without contrast on 06/27/2015, at [facility] -In determinate, ill-defined soft tissue infiltration in the left orbit with questionable enhancement, further evaluation with CT orbits with contrast is recommended to the cyst in ruling out metastasis. -No intracranial metastasis -Diffusely hypoechoic intense bone marrow signal throughout the skull base    ASSESSMENT & PLAN:   Mrs. Layne is a 74 year old  African-American female, who was diagnosed with node positive right breast  Invasive adenocarcinoma in  2009, untreated,  Now presents with low back pain and lumbar spine MRI is highly suspicious for bone metastasis.  1.  Right breast IDA,  Strongly ER positive,  PR weakly positive,  HER-2 positive on first biopsy , but negative on second and third  biopsy 3 and 6 years later, with diffuse bone metastatic and  right pleura, T4NxM1 stage IV  - it has been over 6 years since her initial diagnosis. Her lumbar MRI is very suspicious for bone metastasis.  Her disease course is more consistent with ER positive , HER-2 negative disease. - I discussed her PET scan results with her and her daughter. 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 -I recommend bone biopsy to confirm the metastasis, and also repeat ER/PR/HER-2 to guide further therapy. Her daughter strongly encouraged her to consider biopsy but she again declined bone biopsy after long conversation.  -I discussed her recent breast biopsy results. Again this is a  ER/PR strongly positive invasive ductal carcinoma, HER-2 is negative on this biopsy. Giving the only marginal positive HER-2 on first biopsy, and the natural history of disease, I think this is most consistent with a HER-2 negative disease. - I discussed that her disease at this stage is unlikely curable , I would not offer surgery. The goal of treatment is palliative. - Giving the bone metastasis only, I recommend endocrine therapy as a first-line treatment. I discussed aromatase inhibitors with or without palbociclib, potential side effects of each medication were discussed with patient, which includes but not limited to, fatigue, hot flash, vaginal dryness, musculoskeletal pain and stiffness, cytopenia, risk of infection. -She finally agreed to start anastrozole, and is tolerating well, will continue. -I personally reviewed her PET scanning images with patient and her daughter. She has stable disease overall, increased right pleural effusion, which is malignant confirmed by cytology. She also developed new headaches secondary to the left orbit bone metastasis, she does have some evidence of disease progression. -Giving the suboptimal response on her scan, I recommended her to add Ibrance to anastrozole, the benefit of prolonging duration of response, and side effects,  especially cytopenia and neutropenic fever with discussed with her in details. She agreed to proceed. -C1D15 Ibrance today, ANC 1.1 today, will continue this cycle for a total of 21 days, then one week off.  -she is agreeable to start Xgeva, potential side effects, especially bone pain and osteonecrosis, will discuss with her again today. She voiced good understanding. We'll start today.   2. Left frontal headaches,  -I reviewed her MRI report from [facility], which showed a ill-defined soft tissue in the left orbit, suspicious for bone metastasis -I discussed the option of palliative radiation, or adding Ibrance for better systemic control, which will probably help with her headaches -She was seen by a ophthalmologist at [facility], who recommended her to consider radiation -She is reluctant to have radiation, her headache is slightly better, we'll continue monitoring  3 Right pleural effusion -Cytology was positive for malignant cells , secondary to her breast cancer.  -We'll monitor her pleural effusion closely    4. Low back/ hip pain,  Secondary to metastatic cancer -this has resolved since she started anastrozole -We'll start on Xgeva for her bone metastasis. Benefit and side effects, especially jaw necrosis, were discussed with patient. She is going to have her denture down just months, we'll start injection next month.  5. Anemia -Likely secondary to her breast cancer, bone metastases and the B12 deficiency -Slightly improved after weekly B12 injections. Continue B12 monthly for now -No need for transfusion. We'll follow up closely.  Plan: - continue Arimidex and Ibrance (C1D15 today) -she will return for CBC and diff on 11/10, 10/28 and 12/8 -I will decide the dose of Ibrance for cycle 2 based on her [place] on 11/10, if adequate, will start cycle 2 on 11/14 -start Xgeva today and continue monthly  -I will see her back on 12/8 before cycle 3 on 12/12  All questions were  answered. The patient knows to call the clinic with any problems, questions or concerns. I spent 25 minutes counseling the patient face to face. The total time spent in the appointment was 30 minutes and more than 50% was on counseling.     Truitt Merle, MD 08/15/2015

## 2015-08-16 LAB — CANCER ANTIGEN 27.29: CA 27.29: 424 U/mL — AB (ref 0–39)

## 2015-08-21 ENCOUNTER — Other Ambulatory Visit (HOSPITAL_BASED_OUTPATIENT_CLINIC_OR_DEPARTMENT_OTHER): Payer: Medicare Other

## 2015-08-21 DIAGNOSIS — C50911 Malignant neoplasm of unspecified site of right female breast: Secondary | ICD-10-CM | POA: Diagnosis not present

## 2015-08-21 LAB — COMPREHENSIVE METABOLIC PANEL (CC13)
ALT: 11 U/L (ref 0–55)
AST: 24 U/L (ref 5–34)
Albumin: 3.4 g/dL — ABNORMAL LOW (ref 3.5–5.0)
Alkaline Phosphatase: 73 U/L (ref 40–150)
Anion Gap: 7 mEq/L (ref 3–11)
BUN: 8.5 mg/dL (ref 7.0–26.0)
CHLORIDE: 108 meq/L (ref 98–109)
CO2: 23 mEq/L (ref 22–29)
Calcium: 8 mg/dL — ABNORMAL LOW (ref 8.4–10.4)
Creatinine: 0.7 mg/dL (ref 0.6–1.1)
GLUCOSE: 112 mg/dL (ref 70–140)
POTASSIUM: 4.8 meq/L (ref 3.5–5.1)
SODIUM: 138 meq/L (ref 136–145)
Total Bilirubin: <0.3 mg/dL (ref 0.20–1.20)
Total Protein: 6.3 g/dL — ABNORMAL LOW (ref 6.4–8.3)

## 2015-08-21 LAB — CBC WITH DIFFERENTIAL/PLATELET
BASO%: 0.3 % (ref 0.0–2.0)
BASOS ABS: 0 10*3/uL (ref 0.0–0.1)
EOS ABS: 0 10*3/uL (ref 0.0–0.5)
EOS%: 0.3 % (ref 0.0–7.0)
HCT: 26.6 % — ABNORMAL LOW (ref 34.8–46.6)
HEMOGLOBIN: 8.7 g/dL — AB (ref 11.6–15.9)
LYMPH%: 48.6 % (ref 14.0–49.7)
MCH: 30 pg (ref 25.1–34.0)
MCHC: 32.7 g/dL (ref 31.5–36.0)
MCV: 91.7 fL (ref 79.5–101.0)
MONO#: 0.2 10*3/uL (ref 0.1–0.9)
MONO%: 5.6 % (ref 0.0–14.0)
NEUT#: 1.3 10*3/uL — ABNORMAL LOW (ref 1.5–6.5)
NEUT%: 45.2 % (ref 38.4–76.8)
Platelets: 178 10*3/uL (ref 145–400)
RBC: 2.9 10*6/uL — ABNORMAL LOW (ref 3.70–5.45)
RDW: 15.2 % — AB (ref 11.2–14.5)
WBC: 2.9 10*3/uL — ABNORMAL LOW (ref 3.9–10.3)
lymph#: 1.4 10*3/uL (ref 0.9–3.3)

## 2015-08-22 LAB — CANCER ANTIGEN 27.29: CA 27.29: 424 U/mL — AB (ref 0–39)

## 2015-09-03 ENCOUNTER — Other Ambulatory Visit: Payer: Self-pay | Admitting: Pharmacist

## 2015-09-03 ENCOUNTER — Other Ambulatory Visit: Payer: Self-pay | Admitting: Hematology

## 2015-09-03 ENCOUNTER — Other Ambulatory Visit: Payer: Self-pay | Admitting: *Deleted

## 2015-09-03 DIAGNOSIS — C50511 Malignant neoplasm of lower-outer quadrant of right female breast: Secondary | ICD-10-CM

## 2015-09-03 MED ORDER — PALBOCICLIB 125 MG PO CAPS: 125.0000 mg | ORAL_CAPSULE | Freq: Every day | ORAL | Status: DC

## 2015-09-03 NOTE — Telephone Encounter (Signed)
Spoke with pt and was informed that pt has not been taking Ibrance for more than 10 days due to not having refill of meds.  Pt picked up [place] today.  Instructed pt to start Ibrance today and take daily for 21 days.   Informed pt that a scheduler will contact pt with new dates and times for lab ( on day 14 after starting med ), and  Lab with office visit  With Dr. Burr Medico ( on day 26 of the cycle ).  Pt voiced understanding.    POF sent to scheduler.

## 2015-09-03 NOTE — Telephone Encounter (Signed)
Spoke with daughter Eden Lathe to confirm date pt had restarted Ibrance and clarified dosage.  Meimei unable to tell nurse.  Called pt at home x 2 unsuccessfully.   Left message on voice mail both times to call collaborative nurse back .   Spoke with daughter Eden Lathe again and instructed her to pick up Leslee Home at [facility] today so pt can get started - Per Gerald Stabs, oral chemo pharmacist, Leslee Home was refilled today. Meimei voiced understanding.

## 2015-09-05 ENCOUNTER — Telehealth: Payer: Self-pay | Admitting: Hematology

## 2015-09-05 NOTE — Telephone Encounter (Signed)
Spoke with patients daughter and she is aware of the appt change

## 2015-09-08 ENCOUNTER — Other Ambulatory Visit: Payer: Medicare Other

## 2015-09-16 ENCOUNTER — Other Ambulatory Visit (HOSPITAL_BASED_OUTPATIENT_CLINIC_OR_DEPARTMENT_OTHER): Payer: Medicare Other

## 2015-09-16 ENCOUNTER — Encounter: Payer: Self-pay | Admitting: Hematology

## 2015-09-16 ENCOUNTER — Ambulatory Visit (HOSPITAL_BASED_OUTPATIENT_CLINIC_OR_DEPARTMENT_OTHER): Payer: Medicare Other

## 2015-09-16 VITALS — BP 150/60 | HR 61 | Temp 98.6°F

## 2015-09-16 DIAGNOSIS — D649 Anemia, unspecified: Secondary | ICD-10-CM | POA: Diagnosis not present

## 2015-09-16 DIAGNOSIS — C50511 Malignant neoplasm of lower-outer quadrant of right female breast: Secondary | ICD-10-CM | POA: Diagnosis not present

## 2015-09-16 DIAGNOSIS — C50911 Malignant neoplasm of unspecified site of right female breast: Secondary | ICD-10-CM

## 2015-09-16 DIAGNOSIS — C7971 Secondary malignant neoplasm of right adrenal gland: Secondary | ICD-10-CM

## 2015-09-16 DIAGNOSIS — E538 Deficiency of other specified B group vitamins: Secondary | ICD-10-CM | POA: Diagnosis not present

## 2015-09-16 DIAGNOSIS — C7951 Secondary malignant neoplasm of bone: Secondary | ICD-10-CM

## 2015-09-16 LAB — CBC WITH DIFFERENTIAL/PLATELET
BASO%: 0.7 % (ref 0.0–2.0)
Basophils Absolute: 0 10*3/uL (ref 0.0–0.1)
EOS ABS: 0.1 10*3/uL (ref 0.0–0.5)
EOS%: 1.9 % (ref 0.0–7.0)
HCT: 30 % — ABNORMAL LOW (ref 34.8–46.6)
HEMOGLOBIN: 9.6 g/dL — AB (ref 11.6–15.9)
LYMPH%: 39.1 % (ref 14.0–49.7)
MCH: 30.2 pg (ref 25.1–34.0)
MCHC: 32.2 g/dL (ref 31.5–36.0)
MCV: 93.8 fL (ref 79.5–101.0)
MONO#: 0.3 10*3/uL (ref 0.1–0.9)
MONO%: 7 % (ref 0.0–14.0)
NEUT%: 51.3 % (ref 38.4–76.8)
NEUTROS ABS: 2 10*3/uL (ref 1.5–6.5)
Platelets: 258 10*3/uL (ref 145–400)
RBC: 3.2 10*6/uL — ABNORMAL LOW (ref 3.70–5.45)
RDW: 15.1 % — AB (ref 11.2–14.5)
WBC: 3.9 10*3/uL (ref 3.9–10.3)
lymph#: 1.5 10*3/uL (ref 0.9–3.3)

## 2015-09-16 LAB — COMPREHENSIVE METABOLIC PANEL
ALBUMIN: 3.7 g/dL (ref 3.5–5.0)
ALT: 9 U/L (ref 0–55)
AST: 23 U/L (ref 5–34)
Alkaline Phosphatase: 60 U/L (ref 40–150)
Anion Gap: 7 mEq/L (ref 3–11)
BUN: 8.6 mg/dL (ref 7.0–26.0)
CHLORIDE: 109 meq/L (ref 98–109)
CO2: 23 mEq/L (ref 22–29)
Calcium: 8.6 mg/dL (ref 8.4–10.4)
Creatinine: 0.8 mg/dL (ref 0.6–1.1)
EGFR: 87 mL/min/{1.73_m2} — ABNORMAL LOW (ref >90)
Glucose: 87 mg/dl (ref 70–140)
POTASSIUM: 4.5 meq/L (ref 3.5–5.1)
SODIUM: 139 meq/L (ref 136–145)
Total Bilirubin: 0.4 mg/dL (ref 0.20–1.20)
Total Protein: 6.9 g/dL (ref 6.4–8.3)

## 2015-09-16 MED ORDER — DENOSUMAB 120 MG/1.7ML ~~LOC~~ SOLN
120.0000 mg | Freq: Once | SUBCUTANEOUS | Status: AC
  Administered 2015-09-16: 120 mg via SUBCUTANEOUS
  Filled 2015-09-16: qty 1.7

## 2015-09-16 MED ORDER — CYANOCOBALAMIN 1000 MCG/ML IJ SOLN
1000.0000 ug | Freq: Once | INTRAMUSCULAR | Status: AC
  Administered 2015-09-16: 1000 ug via INTRAMUSCULAR

## 2015-09-16 NOTE — Progress Notes (Signed)
Met with patient and her niece who had some financial concerns. Patient has out of pocket meds that are very costly and also personal bills that she needs assistance with. Approved patient for $1000 [employer]. Patient has copy of award as well as expense form. Patient understands how medication works at outpatient pharmacy. Patient and niece have my card for any additional financial questions or concerns.

## 2015-09-17 LAB — CANCER ANTIGEN 27.29: CA 27.29: 568 U/mL — AB (ref 0–39)

## 2015-09-18 ENCOUNTER — Ambulatory Visit: Payer: Medicare Other

## 2015-09-18 ENCOUNTER — Ambulatory Visit: Payer: Medicare Other | Admitting: Hematology

## 2015-09-18 ENCOUNTER — Other Ambulatory Visit: Payer: Medicare Other

## 2015-09-25 ENCOUNTER — Encounter: Payer: Self-pay | Admitting: Pharmacist

## 2015-09-25 NOTE — Progress Notes (Signed)
Oral Chemotherapy Follow-Up Form  Original Start date of oral chemotherapy: _10/18/16__   Called patient today to follow up regarding patient's oral chemotherapy medication: __Ibrance__  Pt is doing well today. No issues to report. No missed doses or side effects. Appetite and energy levels are normal. Platelets and ANC have been stable throughout treatment so far.   Pt reports _0___ tablets/doses missed in the last week    Pt reports the following side effects: __none_________     Will follow up and call patient again in _3 weeks__   Thank you,  Montel Clock, PharmD, [facility]

## 2015-09-29 ENCOUNTER — Encounter: Payer: Self-pay | Admitting: Hematology

## 2015-09-29 ENCOUNTER — Telehealth: Payer: Self-pay | Admitting: Hematology

## 2015-09-29 ENCOUNTER — Other Ambulatory Visit (HOSPITAL_BASED_OUTPATIENT_CLINIC_OR_DEPARTMENT_OTHER): Payer: Medicare Other

## 2015-09-29 ENCOUNTER — Ambulatory Visit (HOSPITAL_BASED_OUTPATIENT_CLINIC_OR_DEPARTMENT_OTHER): Payer: Medicare Other | Admitting: Hematology

## 2015-09-29 VITALS — BP 129/60 | HR 67 | Temp 98.5°F | Resp 18 | Ht 65.0 in | Wt 197.6 lb

## 2015-09-29 DIAGNOSIS — C50911 Malignant neoplasm of unspecified site of right female breast: Secondary | ICD-10-CM

## 2015-09-29 DIAGNOSIS — Z79811 Long term (current) use of aromatase inhibitors: Secondary | ICD-10-CM | POA: Diagnosis not present

## 2015-09-29 DIAGNOSIS — R51 Headache: Secondary | ICD-10-CM

## 2015-09-29 DIAGNOSIS — C50511 Malignant neoplasm of lower-outer quadrant of right female breast: Secondary | ICD-10-CM | POA: Diagnosis not present

## 2015-09-29 DIAGNOSIS — C7951 Secondary malignant neoplasm of bone: Secondary | ICD-10-CM

## 2015-09-29 DIAGNOSIS — Z170 Estrogen receptor positive status [ER+]: Secondary | ICD-10-CM | POA: Diagnosis not present

## 2015-09-29 DIAGNOSIS — J90 Pleural effusion, not elsewhere classified: Secondary | ICD-10-CM

## 2015-09-29 DIAGNOSIS — G893 Neoplasm related pain (acute) (chronic): Secondary | ICD-10-CM

## 2015-09-29 DIAGNOSIS — D649 Anemia, unspecified: Secondary | ICD-10-CM

## 2015-09-29 LAB — CBC WITH DIFFERENTIAL/PLATELET
BASO%: 0.3 % (ref 0.0–2.0)
BASOS ABS: 0 10*3/uL (ref 0.0–0.1)
EOS%: 1 % (ref 0.0–7.0)
Eosinophils Absolute: 0 10*3/uL (ref 0.0–0.5)
HEMATOCRIT: 30.4 % — AB (ref 34.8–46.6)
HGB: 9.8 g/dL — ABNORMAL LOW (ref 11.6–15.9)
LYMPH#: 1.2 10*3/uL (ref 0.9–3.3)
LYMPH%: 40.8 % (ref 14.0–49.7)
MCH: 30.1 pg (ref 25.1–34.0)
MCHC: 32.2 g/dL (ref 31.5–36.0)
MCV: 93.3 fL (ref 79.5–101.0)
MONO#: 0.2 10*3/uL (ref 0.1–0.9)
MONO%: 6.5 % (ref 0.0–14.0)
NEUT#: 1.5 10*3/uL (ref 1.5–6.5)
NEUT%: 51.4 % (ref 38.4–76.8)
PLATELETS: 203 10*3/uL (ref 145–400)
RBC: 3.26 10*6/uL — ABNORMAL LOW (ref 3.70–5.45)
RDW: 14.2 % (ref 11.2–14.5)
WBC: 2.9 10*3/uL — ABNORMAL LOW (ref 3.9–10.3)

## 2015-09-29 LAB — COMPREHENSIVE METABOLIC PANEL
ALT: 11 U/L (ref 0–55)
ANION GAP: 7 meq/L (ref 3–11)
AST: 23 U/L (ref 5–34)
Albumin: 3.8 g/dL (ref 3.5–5.0)
Alkaline Phosphatase: 56 U/L (ref 40–150)
BUN: 14.3 mg/dL (ref 7.0–26.0)
CALCIUM: 9.6 mg/dL (ref 8.4–10.4)
CHLORIDE: 105 meq/L (ref 98–109)
CO2: 26 mEq/L (ref 22–29)
Creatinine: 0.8 mg/dL (ref 0.6–1.1)
EGFR: 82 mL/min/{1.73_m2} — ABNORMAL LOW (ref >90)
Glucose: 86 mg/dl (ref 70–140)
POTASSIUM: 5.1 meq/L (ref 3.5–5.1)
Sodium: 137 mEq/L (ref 136–145)
Total Bilirubin: 0.35 mg/dL (ref 0.20–1.20)
Total Protein: 7.3 g/dL (ref 6.4–8.3)

## 2015-09-29 MED ORDER — PALBOCICLIB 125 MG PO CAPS: 125.0000 mg | ORAL_CAPSULE | Freq: Every day | ORAL | Status: DC

## 2015-09-29 NOTE — Telephone Encounter (Signed)
per pof to sch pt appt-gave pt copy of avs °

## 2015-09-29 NOTE — Progress Notes (Signed)
[place]  [phone] [phone]  Clinic follow up Note   Patient Care Team: [name], MD as PCP - General  09/29/2015  CHIEF COMPLAINTS  follow up right breast cancer with bone metastasis     Breast cancer of lower-outer quadrant of right Rubio breast ([place])   09/03/2008 Initial Diagnosis right breast cancer, T2N1Mo, stage IIA, ER+, PR+, HER2+ (ration 2.07). Pt declined surgery or other treatment.     05/20/2011 Tumor Marker right breast mass biopsy showed IDA, ER+/PR+/HER2- (ratio 1.37)   09/04/2011 Cancer Staging PET scan showed no distant mets   10/08/2014 Progression  lumbar spine MRI showed multiple bone metastasis , highly suspicious for  metastatic disease.   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 Increased size of right pleural effusion, indeterminate for malignant involvement of the pleural fluids.     HISTORY OF PRESENTING ILLNESS:  Margaret Rubio  Was referred by her primary care physician to discuss the management probable metastatic breast cancer.  She was initially diagnosed with right breast cancer in November 2009. 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 In December of 2009, she underwent a right axillary lymph node biopsy on 09/24/2008 which showed metastatic ductal carcinoma. (Case No: [id number] The tumor was ER 100% positive, PR 0% negative, Ki67 - 22%. 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 The ratio of HER-2:CEP 17 was 2.07.  She decided to pursue nature therapy by diet and  exercise, and did not have surgery and other tranditional therapy for her breast cancer.   In May 2011 she underwent a mammogram that showed a 3.8 cm invasive duct carcinoma in the subareolar region of the right breast which had increased since November 2009.  The tumor was ER  99% positive,  PR 4% positive, HER-2 negative ( ratio 1.37).  She was seen by Dr. EEstella Huskand Dr. NLucia Gaskins  However, she failed multiple appointments and sought alternative medical care in [place] This involved putting something on her right breast that caused some scarring.  She noticed the enlarged right breast mass and nipple inversion about one year ago. No pain, but some skin change. She developed left low back/buttock and hip and  pain in Nov 2015, was seen by PCP Dr. JJenny Reichmannwho prescribed vicodin and tramadol and pain improved with meds. Due to the persistent pain, she underwent a lumber MRI  On 10/08/2014, which showed multiple bone metastasis.  She run out pain medication about a week ago,  But did not call for refill. She states her pain is not bad , but she appears to be uncomfortable when she stands up and walks.  She otherwise feels well overall,  Has good appetite and energy level. She lives alone and still functions well.  She comes in with her niece today.   Current therapy:  1.Anastrozole 1 mg once daily started on 12/10/2014 2. Ibrance added on 07/29/15 3. Xgeva started on 08/15/2015  INTERIM HISTORY: Ms. AGrunertreturns for follow-up. She is doing well overall. Her headache has resolved, she complains about imbalance in the past  few days, no dizziness or other nurological symptoms. She has been tolerating Ibrance well. Her second cycle was postponed due to refill issue. She is currently on second cycle, we'll finish in 3 days. No fever or chills.  MEDICAL HISTORY:  Past Medical History  Diagnosis Date  . 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 ASTHMA 01/31/2008  . BREAST CANCER, HX OF 11/14/2009  .  BRONCHITIS, ACUTE 01/31/2008  . Cough 10/17/2009  . FATIGUE 11/14/2009  . GANGLION CYST 05/01/2009  . HYPERLIPIDEMIA 05/21/2007  . HYPERTENSION 05/05/2007  . LOW BACK PAIN 05/21/2007  . MENOPAUSAL DISORDER 05/22/2010  . Pain in joint, lower leg 05/10/2008  . RASH-NONVESICULAR 05/01/2009  . SHOULDER PAIN, LEFT 11/14/2009  . 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 VITAMIN B12 DEFICIENCY 05/23/2010  . rt breast ca dx'd 2009    right breast    SURGICAL HISTORY: Past Surgical History  Procedure Laterality Date  . Removal of neck lump      benigh 1986  . Myomectomy      SOCIAL HISTORY: History   Social History  . Marital Status: Widowed    Spouse Name: N/A    Number of Children: 1, lives in [place].   . Years of Education: N/A   Occupational History  . Not on file.   Social History Main Topics  . Smoking status: Never Smoker   . Smokeless tobacco: Not on file  . Alcohol Use: No  . Drug Use: No  . Sexual Activity: Not on file   Other Topics Concern  . Not on file   Social History Narrative    FAMILY HISTORY: Family History  Problem Relation Age of Onset  . Asthma Other   . Diabetes Other   . Hypertension Other   . Hypertension Father   . Heart disease Father   Paternal aunt had cancer, unknown type. No other family history of malignancy   ALLERGIES:  is allergic to tomato.  MEDICATIONS:  Current Outpatient Prescriptions  Medication Sig Dispense Refill  . Ascorbic Acid (VITAMIN C) 100 MG CHEW Chew by mouth every morning.    Marland Kitchen aspirin EC 81 MG tablet Take 1 tablet (81 mg total) by mouth daily. 90 tablet 11  . ergocalciferol (VITAMIN D2) 50000 UNITS capsule Take 1 capsule (50,000 Units total) by mouth once a week. 4 capsule 3  . HYDROcodone-acetaminophen (NORCO/VICODIN) 5-325 MG per tablet Take 1 tablet by mouth every 6 (six) hours as needed for moderate pain. 60 tablet 0  . morphine (MS CONTIN) 15 MG 12 hr tablet Take 1 tablet (15 mg total) by mouth every 12 (twelve) hours. 60 tablet  0  . 0    0  . tiZANidine (ZANAFLEX) 4 MG tablet Take 1 tablet (4 mg total) by mouth every 6 (six) hours as needed for muscle spasms. 40 tablet 1   No current facility-administered medications for this visit.    REVIEW OF SYSTEMS:   Constitutional: Denies fevers, chills or abnormal night sweats Eyes: Denies blurriness of vision, double vision or watery eyes Ears, nose, mouth, throat, and face: Denies mucositis or sore throat Respiratory: Denies cough, dyspnea or wheezes Cardiovascular: Denies palpitation, chest discomfort or lower extremity swelling Gastrointestinal:  Denies nausea, heartburn or change in bowel habits Skin: Denies abnormal skin rashes Lymphatics: Denies new lymphadenopathy or easy bruising Neurological:Denies numbness, tingling or new weaknesses Behavioral/Psych: Mood is stable, no new changes  All other systems were reviewed with the patient and are negative.  PHYSICAL EXAMINATION: ECOG PERFORMANCE STATUS: 1 -  Symptomatic but completely ambulatory BP 129/60 mmHg  Pulse 67  Temp(Src) 98.5 F (36.9 C) (Oral)  Resp 18  Ht _0  (1.651 m)  Wt 197 lb 9.6 oz (89.631 kg)  BMI 32.88 kg/m2  SpO2 100%  GENERAL:alert, no distress and comfortable SKIN: skin color, texture, turgor are normal, no rashes or significant lesions EYES: normal, conjunctiva are pink and non-injected, sclera clear OROPHARYNX:no exudate, no erythema and lips, buccal mucosa, and tongue normal  NECK: supple, thyroid normal size, non-tender, without nodularity LYMPH:  no palpable lymphadenopathy in the cervical, axillary or inguinal LUNGS: clear to auscultation and percussion with normal breathing effort HEART: regular rate & rhythm and no murmurs and no lower extremity edema ABDOMEN:abdomen soft, non-tender and normal bowel sounds Musculoskeletal:no cyanosis of digits and no clubbing  PSYCH: alert & oriented x 3 with fluent speech NEURO: no focal motor/sensory deficits Breasts: Breast inspection  showed  Significantly enlarged right breast, compared to left breast, (+) skin pigmentations , nipple inversion and a dry ulcer/ scar below the nipple, no discharge.  The right breast is softer than before, nontender , no discrete mass. Palpation of the  Left breasts and axilla revealed no obvious mass that I could appreciate.   LABORATORY DATA:  I have reviewed the data as listed CBC Latest Ref Rng 09/29/2015 09/16/2015 08/21/2015  WBC 3.9 - 10.3 10e3/uL 2.9(L) 3.9 2.9(L)  Hemoglobin 11.6 - 15.9 g/dL 9.8(L) 9.6(L) 8.7(L)  Hematocrit 34.8 - 46.6 % 30.4(L) 30.0(L) 26.6(L)  Platelets 145 - 400 10e3/uL 203 258 178    CMP Latest Ref Rng 09/29/2015 09/16/2015 08/21/2015  Glucose 70 - 140 mg/dl 86 87 112  BUN 7.0 - 26.0 mg/dL 14.3 8.6 8.5  Creatinine 0.6 - 1.1 mg/dL 0.8 0.8 0.7  Sodium 136 - 145 mEq/L 137 139 138  Potassium 3.5 - 5.1 mEq/L 5.1 4.5 4.8  Chloride 96 - 112 mEq/L - - -  CO2 22 - 29 mEq/L _1 Calcium 8.4 - 10.4 mg/dL 9.6 8.6 8.0(L)  Total Protein 6.4 - 8.3 g/dL 7.3 6.9 6.3(L)  Total Bilirubin 0.20 - 1.20 mg/dL 0.35 0.40 <0.30  Alkaline Phos 40 - 150 U/L 56 60 73  AST 5 - 34 U/L _2 ALT 0 - 55 U/L _3 ANC 1.5  Today   CA 27.29 (Order [id number])      CA 27.29  Status: Finalresult Visible to patient:  Not Released Nextappt: 11/04/2015 at 01:30 PM in Oncology ([facility]) Dx:  Breast cancer of lower-outer quadrant...              Ref Range 5d ago  2wk ago  43moago     CA 27.29 0 - 39 U/mL 428 (H) 568 (H)CM 424 (H)          PATHOLOGY REPORT  RIGHT BREAST MASS, 6 O'CLOCK, NEEDLE CORE BIOPSIES: 09/03/2008 MICROSCOPIC FOCUS OF DUCTAL CARCINOMA IN SITU ASSOCIATED WITH MICROSCOPIC FOCUS OF MICROINVASION. SEE COMMENT.  LYMPH NODE, RIGHT AXILLARY, NEEDLE CORE BIOPSIES: METASTATIC DUCTAL CARCINOMA.  09/24/2008  COMMENT There are needle core biopsies of lymph node showing partial replacement by metastatic  adenocarcinoma consistent with metastasis from a breast primary, metastatic ductal carcinoma. Since the breast prognostic profile is not performed on the previous material because of limited invasive tumor (([id number], the breast prognostic profile will be performed on the tumor in the lymph node and reported in a separate report. Interpretation HER-2/NEU BY CISH -- SHOWS AMPLIFICATION  BY CISH ANALYSIS. THE RATIO OF HER-2: CEP 17 SIGNALS WAS 2.07  Diagnosis 05/20/2011 Breast, right, needle core biopsy - INVASIVE DUCTAL CARCINOMA, SEE COMMENT. - LYMPHOVASCULAR INVASION PRESENT. Estrogen Receptor (Negative, <1%): 99%, STRONG STAINING INTENSITY Progesterone Receptor (Negative, <1%): 4%, STRONG HER-2/NEU BY CISH - NO AMPLIFICATION OF HER-2 DETECTED. THE RATIO OF HER-2: CEP 17 SIGNALS WAS 1.38.  Breast, right, needle core biopsy, 6:30 o'clock 12/13/2014 - INVASIVE MAMMARY CARCINOMA. - SEE MICROSCOPIC DESCRIPTION. Microscopic Comment The features favor Grade II invasive ductal carcinoma. A breast prognostic profile will be performed. Dr. Donato Heinz agrees. The results were called to [facility] on 12/16/14. (JDP:ds 12/16/14) ADDENDUM: Immunohistochemistry for E-cadherin is performed and shows diffuse strong positivity consistent with invasive ductal carcinoma. Estrogen Receptor: 100%, POSITIVE, STRONG STAINING INTENSITY Progesterone Receptor: 2%, POSITIVE, STRONG STAINING INTENSITY Proliferation Marker Ki67: 27% HER-2/NEU BY CISH - NEGATIVE. RESULT RATIO OF HER2: CEP 17 SIGNALS 1.45 AVERAGE HER2 COPY NUMBER PER CELL 2.25  Diagnosis 08/01/2015 PLEURAL FLUID, RIGHT(SPECIMEN 1 OF 1 COLLECTED 08/01/15): METASTATIC CARCINOMA CONSISTENT WITH BREAST PRIMARY.   RADIOGRAPHIC STUDIES: I have personally reviewed the radiological images as listed and agreed with the findings in the report.  PET 07/17/2015 IMPRESSION: 1. Suspected active diverticulitis along the sigmoid colon,  with focal mesenteric stranding and a small locule of gas which is either within an inflamed diverticulum or a micro perforation. No overt free air. 2. Overall similar appearance of multifocal right breast cancer with extensive osseous metastatic disease. The right axillary lymph nodes are less notable that there is a borderline hypermetabolic left axillary lymph node currently. 3. Increasing size of the right pleural effusion, now moderate. Appearance indeterminate for malignant involvement of the pleural fluid. 4. Faintly increased activity in a right infrahilar lymph node which could represent early metastatic spread. 5. Other imaging findings of potential clinical significance: Chronic left maxillary sinusitis. Low-density blood pool suggests anemia. Left adrenal adenoma. These results will be called to the ordering clinician or representative by the Radiologist Assistant, and communication documented in the PACS or zVision Dashboard.  Brain MRI with and without contrast on 06/27/2015, at [facility] -In determinate, ill-defined soft tissue infiltration in the left orbit with questionable enhancement, further evaluation with CT orbits with contrast is recommended to the cyst in ruling out metastasis. -No intracranial metastasis -Diffusely hypoechoic intense bone marrow signal throughout the skull base    ASSESSMENT & PLAN:   Margaret Rubio is a 74 year old  African-American [place], who was diagnosed with node positive right breast  Invasive adenocarcinoma in  2009, untreated,  Now presents with low back pain and lumbar spine MRI is highly suspicious for bone metastasis.  1.  Right breast IDA,  Strongly ER positive,  PR weakly positive,  HER-2 positive on first biopsy , but negative on second and third  biopsy 3 and 6 years later, with diffuse bone metastatic and right pleura, T4NxM1 stage IV  - it has been over 6 years since her initial  diagnosis. Her lumbar MRI is very suspicious for bone metastasis.  Her disease course is more consistent with ER positive , HER-2 negative disease. - I discussed her PET scan results with her and her daughter. 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 -I recommend bone biopsy to confirm the metastasis, and also repeat ER/PR/HER-2 to guide further therapy. Her daughter strongly encouraged her to consider biopsy but she again declined bone biopsy after long conversation.  -I  discussed her recent breast biopsy results. Again this is a ER/PR strongly positive invasive ductal carcinoma, HER-2 is negative on this biopsy. Giving the only marginal positive HER-2 on first biopsy, and the natural history of disease, I think this is most consistent with a HER-2 negative disease. - I discussed that her disease at this stage is unlikely curable , I would not offer surgery. The goal of treatment is palliative. - Giving the bone metastasis only, I recommend endocrine therapy as a first-line treatment. I discussed aromatase inhibitors with or without palbociclib, potential side effects of each medication were discussed with patient, which includes but not limited to, fatigue, hot flash, vaginal dryness, musculoskeletal pain and stiffness, cytopenia, risk of infection. -She finally agreed to start anastrozole, and is tolerating well, will continue. -I personally reviewed her PET scanning images with patient and her daughter. She has stable disease overall, increased right pleural effusion, which is malignant confirmed by cytology. She also developed new headaches secondary to the left orbit bone metastasis, she does have some evidence of disease progression. -Giving the suboptimal response on her scan, I recommended her to add Ibrance to anastrozole, the benefit of prolonging duration of response, and side effects, especially cytopenia and neutropenic fever with discussed with her in details. She  agreed to proceed. -C2D17 Ibrance today, ANC 1.5 today, will continue this cycle for a total of 21 days, then one week off.  -her headache has resolved, and CA 27.29 has decreased again, indicating probabe clinical response   2. Left frontal headaches, secondary to orbit bone mets -has resolved since she started Ibrance  -continue monitoring   3 Right pleural effusion -Cytology was positive for malignant cells , secondary to her breast cancer.  -We'll monitor her pleural effusion closely    4. Low back/ hip pain,  Secondary to metastatic cancer -this has resolved since she started anastrozole -She starts Xgeva one months ago, tolerated well. We'll continue monthly  5. Anemia -Likely secondary to her breast cancer, bone metastases and the B12 deficiency -Slightly improved after B12 injections. Continue B12 monthly for now -No need for transfusion. We'll follow up closely.  Plan: - continue Arimidex and Leslee Home (F2T24 today) -she will start cycle 3 Ibrance on 12/30, continue anastrozole  -continue Xgeva monthly  -RTC on 1/24 for lab and follow up   All questions were answered. The patient knows to call the clinic with any problems, questions or concerns. I spent 25 minutes counseling the patient face to face. The total time spent in the appointment was 30 minutes and more than 50% was on counseling.     Truitt Merle, MD 09/29/2015

## 2015-09-30 LAB — CANCER ANTIGEN 27.29: CA 27.29: 428 U/mL — AB (ref 0–39)

## 2015-10-17 ENCOUNTER — Telehealth: Payer: Self-pay | Admitting: Pharmacist

## 2015-10-17 NOTE — Telephone Encounter (Signed)
10/17/15: Attempted to reach patient for follow up on oral medication: Ibrance. No answer. Left VM for patient to call back with any questions or issues.   Thank you,  Montel Clock, PharmD, [facility] [phone]

## 2015-11-03 ENCOUNTER — Other Ambulatory Visit: Payer: Self-pay | Admitting: *Deleted

## 2015-11-03 ENCOUNTER — Telehealth: Payer: Self-pay | Admitting: Pharmacist

## 2015-11-03 DIAGNOSIS — C50511 Malignant neoplasm of lower-outer quadrant of right female breast: Secondary | ICD-10-CM

## 2015-11-03 NOTE — Telephone Encounter (Signed)
11/03/15: Attempted to reach patient for follow up on oral medication: Ibrance. No answer. Left VM for patient to call back with any questions or issues.   Thank you,  Montel Clock, PharmD, [facility] [phone]

## 2015-11-04 ENCOUNTER — Telehealth: Payer: Self-pay | Admitting: Hematology

## 2015-11-04 ENCOUNTER — Encounter: Payer: Medicare Other | Admitting: Hematology

## 2015-11-04 ENCOUNTER — Ambulatory Visit: Payer: Medicare Other

## 2015-11-04 ENCOUNTER — Other Ambulatory Visit: Payer: Self-pay | Admitting: *Deleted

## 2015-11-04 ENCOUNTER — Encounter: Payer: Self-pay | Admitting: Hematology

## 2015-11-04 ENCOUNTER — Other Ambulatory Visit: Payer: Medicare Other

## 2015-11-04 DIAGNOSIS — C50919 Malignant neoplasm of unspecified site of unspecified female breast: Secondary | ICD-10-CM | POA: Insufficient documentation

## 2015-11-04 DIAGNOSIS — D630 Anemia in neoplastic disease: Secondary | ICD-10-CM | POA: Insufficient documentation

## 2015-11-04 DIAGNOSIS — J90 Pleural effusion, not elsewhere classified: Secondary | ICD-10-CM | POA: Insufficient documentation

## 2015-11-04 DIAGNOSIS — C7951 Secondary malignant neoplasm of bone: Secondary | ICD-10-CM

## 2015-11-04 NOTE — Telephone Encounter (Signed)
cld pt and left a message in re to no show appt today-adv pt to call and r/s appts as well as inj

## 2015-11-04 NOTE — Progress Notes (Signed)
No show  This encounter was created in error - please disregard.

## 2015-11-17 ENCOUNTER — Encounter: Payer: Self-pay | Admitting: Pharmacist

## 2015-11-17 NOTE — Progress Notes (Signed)
Oral Chemotherapy Follow-Up Form  Original Start date of oral chemotherapy: _10/18/16__   Called patient today to follow up regarding patient's oral chemotherapy medication: __Ibrance__  Pt is doing well today. No issues to report. No missed doses or side effects. Appetite and energy levels are normal. Platelets and ANC have been stable throughout treatment so far. Ms. Tool missed appointment for follow up with Dr. Burr Medico at the end of January. Pt has been up [place] in [place] I believe.   Pt reports _0___ tablets/doses missed in the last week    Pt reports the following side effects: __none____   Will follow up and call patient again in _3 weeks__   Thank you,  Montel Clock, PharmD, [facility]

## 2015-12-05 ENCOUNTER — Telehealth: Payer: Self-pay | Admitting: Pharmacist

## 2015-12-05 NOTE — Telephone Encounter (Signed)
Oral Chemotherapy Follow-Up Form  Original Start date of oral chemotherapy: 07-29-15   Called patient today to follow up regarding patient's oral chemotherapy medication: Ibrance  Pt is doing well today. She is out of town in [place]. She endorses that since her last "off week" of Ibrance she has not restarted Ibrance due to being out of town and not being able to pick up her prescription here. She will return home next week and plans to restart then. I reinforced importance of adherence to medication.  Pt reports doses missed within the last week. Missed dose(s) attributed to: not being able to pick up prescription/out of town  Pt reports the following side effects: none  Will follow up and call patient again in 1 month   Thank you,  Skip Mayer, PharmD, [facility]

## 2015-12-12 ENCOUNTER — Telehealth: Payer: Self-pay | Admitting: Pharmacist

## 2015-12-12 NOTE — Telephone Encounter (Signed)
Pt called requesting a follow up appt with Dr. Burr Medico.  Stated she is still in [place], and will be back in town next week.  Informed pt that Dr. Burr Medico will be notified.  A scheduler will contact pt with appt date and time with Dr. Ernestina Penna instructions.  Pt voiced understanding. Pt's  Phone    [phone].

## 2015-12-12 NOTE — Telephone Encounter (Signed)
12/12/15: Ms Persico returned call this afternoon. She is still in [place] and will be returning next week. She has been without Ibrance for at least 2 weeks. Counseled on importance of adherence to medication. She will pick up when she returns. Transferred call for Ms. Routson to schedule appointment  Thank you,  Montel Clock, PharmD, [facility]

## 2015-12-24 ENCOUNTER — Other Ambulatory Visit: Payer: Self-pay | Admitting: Hematology

## 2015-12-24 ENCOUNTER — Telehealth: Payer: Self-pay | Admitting: Hematology

## 2015-12-24 NOTE — Telephone Encounter (Signed)
lvm for pt regarding to march appt... °

## 2015-12-25 ENCOUNTER — Telehealth: Payer: Self-pay | Admitting: *Deleted

## 2015-12-25 NOTE — Telephone Encounter (Signed)
Spoke with Amy @ [facility] and was informed that pt wished to transfer her care to this clinic to be close to her family in [place].  Pt was seen as first time consult at [facility] on 12/12/15.  At this visit , pt informed Amy that pt had not been taking Ibrance for a few weeks.   Pt did receive Xgeva injection on 12/16/15. Informed Amy that pt  last seen at our clinic was on  09/29/15.   Ibrance dose was 125 mg daily for  21 days  Each cycle. Asked Amy to fax office notes from 3/3 to our clinic for Dr. Burr Medico to review.   Faxed last office notes from Dr. Burr Medico to Amy, [place] @ [facility] for her physician to review. Amy's    Phone     [phone]   ;    Fax       [phone].

## 2015-12-25 NOTE — Telephone Encounter (Signed)
Attempted to call pt x 1 yesterday pm unsuccessfully;  Attempted calling pt again today x 2 on home phone unsuccessfully.  Unable to leave messages due to voice mail not set up yet.   Called niece Gerrie Nordmann and left message on voice mail requesting a call back from pt.

## 2016-01-02 ENCOUNTER — Other Ambulatory Visit: Payer: Medicare Other

## 2016-01-02 ENCOUNTER — Encounter: Payer: Self-pay | Admitting: Hematology

## 2016-01-02 ENCOUNTER — Encounter: Payer: Medicare Other | Admitting: Hematology

## 2016-01-02 NOTE — Progress Notes (Signed)
This encounter was created in error - please disregard.

## 2016-01-06 ENCOUNTER — Telehealth: Payer: Self-pay | Admitting: Hematology

## 2016-01-06 ENCOUNTER — Other Ambulatory Visit: Payer: Self-pay | Admitting: *Deleted

## 2016-01-06 ENCOUNTER — Ambulatory Visit: Payer: Self-pay

## 2016-01-06 ENCOUNTER — Ambulatory Visit (HOSPITAL_BASED_OUTPATIENT_CLINIC_OR_DEPARTMENT_OTHER): Payer: Medicare PPO

## 2016-01-06 ENCOUNTER — Ambulatory Visit (HOSPITAL_BASED_OUTPATIENT_CLINIC_OR_DEPARTMENT_OTHER): Payer: Medicare Other | Admitting: Hematology

## 2016-01-06 ENCOUNTER — Encounter: Payer: Self-pay | Admitting: Hematology

## 2016-01-06 VITALS — BP 134/67 | HR 75 | Temp 98.2°F | Resp 18 | Wt 195.6 lb

## 2016-01-06 DIAGNOSIS — C50511 Malignant neoplasm of lower-outer quadrant of right female breast: Secondary | ICD-10-CM | POA: Diagnosis not present

## 2016-01-06 DIAGNOSIS — D519 Vitamin B12 deficiency anemia, unspecified: Secondary | ICD-10-CM

## 2016-01-06 DIAGNOSIS — J90 Pleural effusion, not elsewhere classified: Secondary | ICD-10-CM

## 2016-01-06 DIAGNOSIS — E538 Deficiency of other specified B group vitamins: Secondary | ICD-10-CM

## 2016-01-06 DIAGNOSIS — Z170 Estrogen receptor positive status [ER+]: Secondary | ICD-10-CM

## 2016-01-06 DIAGNOSIS — C7951 Secondary malignant neoplasm of bone: Secondary | ICD-10-CM

## 2016-01-06 DIAGNOSIS — D630 Anemia in neoplastic disease: Secondary | ICD-10-CM

## 2016-01-06 DIAGNOSIS — I10 Essential (primary) hypertension: Secondary | ICD-10-CM

## 2016-01-06 LAB — COMPREHENSIVE METABOLIC PANEL
ALT: 25 U/L (ref 0–55)
AST: 37 U/L — AB (ref 5–34)
Albumin: 3.6 g/dL (ref 3.5–5.0)
Alkaline Phosphatase: 53 U/L (ref 40–150)
Anion Gap: 6 mEq/L (ref 3–11)
BUN: 12.1 mg/dL (ref 7.0–26.0)
CHLORIDE: 107 meq/L (ref 98–109)
CO2: 26 meq/L (ref 22–29)
CREATININE: 0.8 mg/dL (ref 0.6–1.1)
Calcium: 9.2 mg/dL (ref 8.4–10.4)
EGFR: 86 mL/min/{1.73_m2} — ABNORMAL LOW (ref >90)
Glucose: 100 mg/dl (ref 70–140)
POTASSIUM: 4.1 meq/L (ref 3.5–5.1)
SODIUM: 139 meq/L (ref 136–145)
Total Bilirubin: 0.37 mg/dL (ref 0.20–1.20)
Total Protein: 7 g/dL (ref 6.4–8.3)

## 2016-01-06 LAB — CBC WITH DIFFERENTIAL/PLATELET
BASO%: 0.3 % (ref 0.0–2.0)
Basophils Absolute: 0 10*3/uL (ref 0.0–0.1)
EOS%: 1 % (ref 0.0–7.0)
Eosinophils Absolute: 0 10*3/uL (ref 0.0–0.5)
HEMATOCRIT: 29.6 % — AB (ref 34.8–46.6)
HGB: 9.8 g/dL — ABNORMAL LOW (ref 11.6–15.9)
LYMPH#: 1.4 10*3/uL (ref 0.9–3.3)
LYMPH%: 36.1 % (ref 14.0–49.7)
MCH: 29.4 pg (ref 25.1–34.0)
MCHC: 33.1 g/dL (ref 31.5–36.0)
MCV: 88.9 fL (ref 79.5–101.0)
MONO#: 0.3 10*3/uL (ref 0.1–0.9)
MONO%: 8.5 % (ref 0.0–14.0)
NEUT%: 54.1 % (ref 38.4–76.8)
NEUTROS ABS: 2.2 10*3/uL (ref 1.5–6.5)
PLATELETS: 184 10*3/uL (ref 145–400)
RBC: 3.33 10*6/uL — ABNORMAL LOW (ref 3.70–5.45)
RDW: 13.6 % (ref 11.2–14.5)
WBC: 4 10*3/uL (ref 3.9–10.3)

## 2016-01-06 MED ORDER — DENOSUMAB 120 MG/1.7ML ~~LOC~~ SOLN
120.0000 mg | Freq: Once | SUBCUTANEOUS | Status: AC
  Administered 2016-01-06: 120 mg via SUBCUTANEOUS
  Filled 2016-01-06: qty 1.7

## 2016-01-06 MED ORDER — CYANOCOBALAMIN 1000 MCG/ML IJ SOLN
1000.0000 ug | Freq: Once | INTRAMUSCULAR | Status: AC
  Administered 2016-01-06: 1000 ug via INTRAMUSCULAR

## 2016-01-06 MED ORDER — PALBOCICLIB 125 MG PO CAPS: 125.0000 mg | ORAL_CAPSULE | Freq: Every day | ORAL | Status: DC

## 2016-01-06 MED FILL — ANASTROZOLE 1 MG TABLET: 1 | 30 days supply | Qty: 30 | Fill #1

## 2016-01-06 NOTE — Progress Notes (Signed)
[facility]  Telephone:([phone]) [phone] Fax:(336) [phone]  Clinic follow up Note   Patient Care Team: [name], MD as PCP - General  01/06/2016  CHIEF COMPLAINTS  follow up right breast cancer with bone metastasis     Breast cancer of lower-outer quadrant of right female breast ([place])   09/03/2008 Initial Diagnosis right breast cancer, T2N1Mo, stage IIA, ER+, PR+, HER2+ (ration 2.07). Pt declined surgery or other treatment.     05/20/2011 Tumor Marker right breast mass biopsy showed IDA, ER+/PR+/HER2- (ratio 1.37)   09/04/2011 Cancer Staging PET scan showed no distant mets   10/08/2014 Progression  lumbar spine MRI showed multiple bone metastasis , highly suspicious for  metastatic disease.   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 Increased size of right pleural effusion, indeterminate for malignant involvement of the pleural fluids.     HISTORY OF PRESENTING ILLNESS:  Margaret Rubio 75 y.o. female  Was referred by her primary care physician to discuss the management probable metastatic breast cancer.  She was initially diagnosed with right breast cancer in November 2009. 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 In December of 2009, she underwent a right axillary lymph node biopsy on 09/24/2008 which showed metastatic ductal carcinoma. (Case No: [id number]) The tumor was ER 100% positive, PR 0% negative, Ki67 - 22%. 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 The ratio of HER-2:CEP 17 was 2.07.  She decided to pursue  nature therapy by diet and exercise, and did not have surgery and other tranditional therapy for her breast cancer.   In May 2011 she underwent a mammogram that showed a 3.8 cm invasive duct carcinoma in the subareolar region of the right breast which had increased since November 2009.  The tumor was ER  99% positive,  PR 4% positive, HER-2 negative ( ratio 1.37).  She was seen by Dr. Estella Husk and Dr. Lucia Gaskins.  However, she failed multiple appointments and sought alternative medical care in [place]. This involved putting something on her right breast that caused some scarring.  She noticed the enlarged right breast mass and nipple inversion about one year ago. No pain, but some skin change. She developed left low back/buttock and hip and  pain in Nov 2015, was seen by PCP Dr. Jenny Reichmann who prescribed vicodin and tramadol and pain improved with meds. Due to the persistent pain, she underwent a lumber MRI  On 10/08/2014, which showed multiple bone metastasis.  She run out pain medication about a week ago,  But did not call for refill. She states her pain is not bad , but she appears to be uncomfortable when she stands up and walks.  She otherwise feels well overall,  Has good appetite and energy level. She lives alone and still functions well.  She comes in with her niece today.   Current therapy:  1.Anastrozole 1 mg once daily started on 12/10/2014 2. Ibrance added on 07/29/15 3. Xgeva started on 08/15/2015  INTERIM HISTORY: Margaret Rubio returns for follow-up. She was last seen by me about 3-4 months ago, and she lost  follow-up afterwards. She went to [place] and stayed with her daughter until she came back to town recently. She has not been taking Ibrance for the past 3 months, and also ran out of anastrozole about 2 months ago. Her daughter wanted her to stay in [place], she was seen by oncologist at [facility] once about months ago, but she decided to come back to [place], and  walked in today for follow-up.  She has been doing well overall, denies any significant pain, except mild discomfort in the right hip occasionally. She works independently, has not been using cane lately. She has mild to moderate fatigue, able to function and liver independently. She denies any cough, shortness breasts or chest pain, or other symptoms. She has good appetite and eats well. No other new complaints  MEDICAL HISTORY:  Past Medical History  Diagnosis Date  . 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 ASTHMA 01/31/2008  . BREAST CANCER, HX OF 11/14/2009  . BRONCHITIS, ACUTE 01/31/2008  . Cough 10/17/2009  . FATIGUE 11/14/2009  . GANGLION CYST 05/01/2009  . HYPERLIPIDEMIA 05/21/2007  . HYPERTENSION 05/05/2007  . LOW BACK PAIN 05/21/2007  . MENOPAUSAL DISORDER 05/22/2010  . Pain in joint, lower leg 05/10/2008  . RASH-NONVESICULAR 05/01/2009  . SHOULDER PAIN, LEFT 11/14/2009  . 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 VITAMIN B12 DEFICIENCY 05/23/2010  . rt breast ca dx'd 2009    right breast    SURGICAL HISTORY: Past Surgical History  Procedure Laterality Date  . Removal of neck lump      benigh 1986  . Myomectomy      SOCIAL HISTORY: History   Social History  . Marital Status: Widowed    Spouse Name: N/A    Number of Children: 32, lives in [place].   . Years of Education: N/A   Occupational History  . Not on file.   Social History Main Topics  . Smoking status: Never Smoker   . Smokeless tobacco: Not on file  . Alcohol Use: No  . Drug Use: No  . Sexual Activity: Not on file   Other Topics Concern  . Not on file   Social History Narrative    FAMILY HISTORY: Family History  Problem Relation Age of Onset  . Asthma Other   . Diabetes Other   . Hypertension Other   . Hypertension Father   . Heart disease Father   Paternal aunt had cancer, unknown type. No other family history of malignancy   ALLERGIES:  is allergic to tomato.  MEDICATIONS:  Current Outpatient Prescriptions    Medication Sig Dispense Refill  . Ascorbic Acid (VITAMIN C) 100 MG CHEW Chew by mouth every morning.    Marland Kitchen aspirin EC 81 MG tablet Take 1 tablet (81 mg total) by mouth daily. 90 tablet 11  . ergocalciferol (VITAMIN D2) 50000 UNITS capsule Take 1 capsule (50,000 Units total) by mouth once a week. 4 capsule 3  . HYDROcodone-acetaminophen (NORCO/VICODIN) 5-325 MG per tablet Take 1 tablet by mouth every 6 (six) hours as needed for moderate pain. 60 tablet 0  . morphine (MS CONTIN) 15 MG 12 hr tablet Take 1 tablet (15 mg total) by mouth every 12 (twelve) hours. 60 tablet 0  . 0    0  . tiZANidine (ZANAFLEX) 4 MG tablet Take 1 tablet (4 mg total) by mouth every 6 (six) hours as needed for muscle spasms. 40 tablet 1   No current facility-administered medications for this visit.    REVIEW  OF SYSTEMS:   Constitutional: Denies fevers, chills or abnormal night sweats Eyes: Denies blurriness of vision, double vision or watery eyes Ears, nose, mouth, throat, and face: Denies mucositis or sore throat Respiratory: Denies cough, dyspnea or wheezes Cardiovascular: Denies palpitation, chest discomfort or lower extremity swelling Gastrointestinal:  Denies nausea, heartburn or change in bowel habits Skin: Denies abnormal skin rashes Lymphatics: Denies new lymphadenopathy or easy bruising Neurological:Denies numbness, tingling or new weaknesses Behavioral/Psych: Mood is stable, no new changes  All other systems were reviewed with the patient and are negative.  PHYSICAL EXAMINATION: ECOG PERFORMANCE STATUS: 1 - Symptomatic but completely ambulatory BP 134/67 mmHg  Pulse 75  Temp(Src) 98.2 F (36.8 C) (Oral)  Resp 18  Wt 195 lb 9.6 oz (88.724 kg)  SpO2 100%  GENERAL:alert, no distress and comfortable SKIN: skin color, texture, turgor are normal, no rashes or significant lesions EYES: normal, conjunctiva are pink and non-injected, sclera clear OROPHARYNX:no exudate, no erythema and lips, buccal mucosa,  and tongue normal  NECK: supple, thyroid normal size, non-tender, without nodularity LYMPH:  no palpable lymphadenopathy in the cervical, axillary or inguinal LUNGS: clear to auscultation and percussion with normal breathing effort, slightly decreased breath sounds at the bottom of right lung. HEART: regular rate & rhythm and no murmurs and no lower extremity edema ABDOMEN:abdomen soft, non-tender and normal bowel sounds Musculoskeletal:no cyanosis of digits and no clubbing  PSYCH: alert & oriented x 3 with fluent speech NEURO: no focal motor/sensory deficits Breasts: Breast inspection showed  Significantly enlarged right breast, compared to left breast, (+) skin pigmentations , nipple inversion and a dry ulcer/ scar below the nipple, no discharge.  The right breast is softer than before, nontender , no discrete mass. Palpation of the  Left breasts and axilla revealed no obvious mass that I could appreciate.   LABORATORY DATA:  I have reviewed the data as listed CBC Latest Ref Rng 01/06/2016 09/29/2015 09/16/2015  WBC 3.9 - 10.3 10e3/uL 4.0 2.9(L) 3.9  Hemoglobin 11.6 - 15.9 g/dL 9.8(L) 9.8(L) 9.6(L)  Hematocrit 34.8 - 46.6 % 29.6(L) 30.4(L) 30.0(L)  Platelets 145 - 400 10e3/uL 184 203 258    CMP Latest Ref Rng 01/06/2016 09/29/2015 09/16/2015  Glucose 70 - 140 mg/dl 100 86 87  BUN 7.0 - 26.0 mg/dL 12.1 14.3 8.6  Creatinine 0.6 - 1.1 mg/dL 0.8 0.8 0.8  Sodium 136 - 145 mEq/L 139 137 139  Potassium 3.5 - 5.1 mEq/L 4.1 5.1 4.5  CO2 22 - 29 mEq/L '26 26 23  '$ Calcium 8.4 - 10.4 mg/dL 9.2 9.6 8.6  Total Protein 6.4 - 8.3 g/dL 7.0 7.3 6.9  Total Bilirubin 0.20 - 1.20 mg/dL 0.37 0.35 0.40  Alkaline Phos 40 - 150 U/L 53 56 60  AST 5 - 34 U/L 37(H) 23 23  ALT 0 - 55 U/L '25 11 9    '$ PATHOLOGY REPORT  RIGHT BREAST MASS, 6 O'CLOCK, NEEDLE CORE BIOPSIES: 09/03/2008 MICROSCOPIC FOCUS OF DUCTAL CARCINOMA IN SITU ASSOCIATED WITH MICROSCOPIC FOCUS OF MICROINVASION. SEE COMMENT.  LYMPH NODE,  RIGHT AXILLARY, NEEDLE CORE BIOPSIES: METASTATIC DUCTAL CARCINOMA.  09/24/2008  COMMENT There are needle core biopsies of lymph node showing partial replacement by metastatic adenocarcinoma consistent with metastasis from a breast primary, metastatic ductal carcinoma. Since the breast prognostic profile is not performed on the previous material because of limited invasive tumor ([id number]), the breast prognostic profile will be performed on the tumor in the lymph node and reported in a separate report. Interpretation HER-2/NEU BY  CISH -- SHOWS AMPLIFICATION BY CISH ANALYSIS. THE RATIO OF HER-2: CEP 17 SIGNALS WAS 2.07  Diagnosis 05/20/2011 Breast, right, needle core biopsy - INVASIVE DUCTAL CARCINOMA, SEE COMMENT. - LYMPHOVASCULAR INVASION PRESENT. Estrogen Receptor (Negative, <1%): 99%, STRONG STAINING INTENSITY Progesterone Receptor (Negative, <1%): 4%, STRONG HER-2/NEU BY CISH - NO AMPLIFICATION OF HER-2 DETECTED. THE RATIO OF HER-2: CEP 17 SIGNALS WAS 1.38.  Breast, right, needle core biopsy, 6:30 o'clock 12/13/2014 - INVASIVE MAMMARY CARCINOMA. - SEE MICROSCOPIC DESCRIPTION. Microscopic Comment The features favor Grade II invasive ductal carcinoma. A breast prognostic profile will be performed. Dr. Donato Heinz agrees. The results were called to [facility] on 12/16/14. (JDP:ds 12/16/14) ADDENDUM: Immunohistochemistry for E-cadherin is performed and shows diffuse strong positivity consistent with invasive ductal carcinoma. Estrogen Receptor: 100%, POSITIVE, STRONG STAINING INTENSITY Progesterone Receptor: 2%, POSITIVE, STRONG STAINING INTENSITY Proliferation Marker Ki67: 27% HER-2/NEU BY CISH - NEGATIVE. RESULT RATIO OF HER2: CEP 17 SIGNALS 1.45 AVERAGE HER2 COPY NUMBER PER CELL 2.25  Diagnosis 08/01/2015 PLEURAL FLUID, RIGHT(SPECIMEN 1 OF 1 COLLECTED 08/01/15): METASTATIC CARCINOMA CONSISTENT WITH BREAST PRIMARY.   RADIOGRAPHIC STUDIES: I have  personally reviewed the radiological images as listed and agreed with the findings in the report.  PET 07/17/2015 IMPRESSION: 1. Suspected active diverticulitis along the sigmoid colon, with focal mesenteric stranding and a small locule of gas which is either within an inflamed diverticulum or a micro perforation. No overt free air. 2. Overall similar appearance of multifocal right breast cancer with extensive osseous metastatic disease. The right axillary lymph nodes are less notable that there is a borderline hypermetabolic left axillary lymph node currently. 3. Increasing size of the right pleural effusion, now moderate. Appearance indeterminate for malignant involvement of the pleural fluid. 4. Faintly increased activity in a right infrahilar lymph node which could represent early metastatic spread. 5. Other imaging findings of potential clinical significance: Chronic left maxillary sinusitis. Low-density blood pool suggests anemia. Left adrenal adenoma. These results will be called to the ordering clinician or representative by the Radiologist Assistant, and communication documented in the PACS or zVision Dashboard.  Brain MRI with and without contrast on 06/27/2015, at [facility] -In determinate, ill-defined soft tissue infiltration in the left orbit with questionable enhancement, further evaluation with CT orbits with contrast is recommended to the cyst in ruling out metastasis. -No intracranial metastasis -Diffusely hypoechoic intense bone marrow signal throughout the skull base    ASSESSMENT & PLAN:   Mrs. Hoar is a 75 year old  African-American female, who was diagnosed with node positive right breast  Invasive adenocarcinoma in  2009, untreated,  Now presents with low back pain and lumbar spine MRI is highly suspicious for bone metastasis.  1.  Right breast IDA,  Strongly ER positive,  PR weakly positive,  HER-2 positive on first  biopsy , but negative on second and third  biopsy 3 and 6 years later, with diffuse bone metastatic and right pleura, T4NxM1 stage IV  - it has been over 6 years since her initial diagnosis. Her lumbar MRI is very suspicious for bone metastasis.  Her disease course is more consistent with ER positive , HER-2 negative disease. - I discussed her PET scan results with her and her daughter. 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 -I recommend bone biopsy to confirm the metastasis, and also repeat ER/PR/HER-2 to guide further therapy. Her daughter strongly encouraged her to consider biopsy but she again declined bone biopsy after  long conversation.  -I discussed her recent breast biopsy results. Again this is a ER/PR strongly positive invasive ductal carcinoma, HER-2 is negative on this biopsy. Giving the only marginal positive HER-2 on first biopsy, and the natural history of disease, I think this is most consistent with a HER-2 negative disease. - I discussed that her disease at this stage is unlikely curable , I would not offer surgery. The goal of treatment is palliative. - Giving the bone metastasis only, I recommend endocrine therapy as a first-line treatment. I discussed aromatase inhibitors with or without palbociclib, potential side effects of each medication were discussed with patient, which includes but not limited to, fatigue, hot flash, vaginal dryness, musculoskeletal pain and stiffness, cytopenia, risk of infection. -She finally agreed to start anastrozole, and is tolerating well, will continue. -I personally reviewed her PET scanning images from 07/17/2015 with patient and her daughter. She has stable disease overall, increased right pleural effusion, which is malignant confirmed by cytology. She also developed new headaches secondary to the left orbit bone metastasis, she does have some evidence of disease progression. -Giving the suboptimal response on her scan, I  recommended her to add Ibrance to anastrozole, the benefit of prolonging duration of response, and side effects, especially cytopenia and neutropenic fever with discussed with her in details. She agreed to proceed. -she unfortunately lost to follow-up after 2-3 months of Ibrance, but now back to my clinic again. -I reviewed her anastrozole and Ibrance today, she will restart tomorrow. -I'll obtain a set of CT scans and bone scan to evaluate her disease status.   2. Left frontal headaches, secondary to orbit bone mets -has resolved since she started Ibrance  -continue monitoring   3 Right pleural effusion -Cytology was positive for malignant cells , secondary to her breast cancer.  -We'll monitor her pleural effusion closely    4. Low back/ hip pain,  Secondary to metastatic cancer -this has resolved since she started anastrozole -She starts Xgeva one months ago, tolerated well. We'll continue monthly  5. Anemia -Likely secondary to her breast cancer, bone metastases and the B12 deficiency -Slightly improved after B12 injections. Continue B12 monthly for now -No need for transfusion. We'll follow up closely.  Plan: -She will restart Arimidex and Ibrance tomorrow  -B12 and Xgeva injections today and continue monthly  -CT CAP with contrast and bone scan in the next two weeks for restaging -RTC in 2 weeks for lab and I will see her back in 4 weeks with injections   All questions were answered. The patient knows to call the clinic with any problems, questions or concerns. I spent 25 minutes counseling the patient face to face. The total time spent in the appointment was 30 minutes and more than 50% was on counseling.     Truitt Merle, MD 01/06/2016

## 2016-01-06 NOTE — Telephone Encounter (Signed)
per pof to sch pt appt-gave pt copy of avs °

## 2016-01-07 LAB — CANCER ANTIGEN 27-29 (PARALLEL TESTING): CA 27.29: 385 U/mL — ABNORMAL HIGH

## 2016-01-07 LAB — CANCER ANTIGEN 27.29: CA 27.29: 367.6 U/mL — ABNORMAL HIGH (ref 0.0–38.6)

## 2016-01-15 ENCOUNTER — Ambulatory Visit (HOSPITAL_BASED_OUTPATIENT_CLINIC_OR_DEPARTMENT_OTHER): Payer: Medicare PPO | Admitting: Hematology

## 2016-01-15 ENCOUNTER — Telehealth: Payer: Self-pay | Admitting: Hematology

## 2016-01-15 ENCOUNTER — Ambulatory Visit (HOSPITAL_BASED_OUTPATIENT_CLINIC_OR_DEPARTMENT_OTHER): Payer: Medicare PPO

## 2016-01-15 ENCOUNTER — Other Ambulatory Visit: Payer: Self-pay | Admitting: *Deleted

## 2016-01-15 ENCOUNTER — Encounter: Payer: Self-pay | Admitting: Hematology

## 2016-01-15 ENCOUNTER — Telehealth: Payer: Self-pay | Admitting: Internal Medicine

## 2016-01-15 VITALS — BP 157/70 | HR 71 | Temp 99.4°F | Resp 18 | Ht 65.0 in | Wt 192.3 lb

## 2016-01-15 DIAGNOSIS — J90 Pleural effusion, not elsewhere classified: Secondary | ICD-10-CM

## 2016-01-15 DIAGNOSIS — Z170 Estrogen receptor positive status [ER+]: Secondary | ICD-10-CM

## 2016-01-15 DIAGNOSIS — J069 Acute upper respiratory infection, unspecified: Secondary | ICD-10-CM | POA: Diagnosis not present

## 2016-01-15 DIAGNOSIS — D649 Anemia, unspecified: Secondary | ICD-10-CM

## 2016-01-15 DIAGNOSIS — C50511 Malignant neoplasm of lower-outer quadrant of right female breast: Secondary | ICD-10-CM

## 2016-01-15 DIAGNOSIS — C7951 Secondary malignant neoplasm of bone: Secondary | ICD-10-CM

## 2016-01-15 LAB — COMPREHENSIVE METABOLIC PANEL
ALBUMIN: 3.7 g/dL (ref 3.5–5.0)
ALK PHOS: 55 U/L (ref 40–150)
ALT: 14 U/L (ref 0–55)
AST: 36 U/L — AB (ref 5–34)
Anion Gap: 10 mEq/L (ref 3–11)
BILIRUBIN TOTAL: 0.59 mg/dL (ref 0.20–1.20)
BUN: 12.9 mg/dL (ref 7.0–26.0)
CO2: 24 meq/L (ref 22–29)
CREATININE: 0.9 mg/dL (ref 0.6–1.1)
Calcium: 8.9 mg/dL (ref 8.4–10.4)
Chloride: 101 mEq/L (ref 98–109)
EGFR: 74 mL/min/{1.73_m2} — AB (ref >90)
GLUCOSE: 94 mg/dL (ref 70–140)
Potassium: 3.8 mEq/L (ref 3.5–5.1)
SODIUM: 135 meq/L — AB (ref 136–145)
TOTAL PROTEIN: 7.4 g/dL (ref 6.4–8.3)

## 2016-01-15 LAB — CBC WITH DIFFERENTIAL/PLATELET
BASO%: 0 % (ref 0.0–2.0)
Basophils Absolute: 0 10*3/uL (ref 0.0–0.1)
EOS ABS: 0 10*3/uL (ref 0.0–0.5)
EOS%: 0 % (ref 0.0–7.0)
HCT: 30.1 % — ABNORMAL LOW (ref 34.8–46.6)
HEMOGLOBIN: 10.1 g/dL — AB (ref 11.6–15.9)
LYMPH%: 28.1 % (ref 14.0–49.7)
MCH: 29.2 pg (ref 25.1–34.0)
MCHC: 33.6 g/dL (ref 31.5–36.0)
MCV: 87 fL (ref 79.5–101.0)
MONO#: 0.1 10*3/uL (ref 0.1–0.9)
MONO%: 4.7 % (ref 0.0–14.0)
NEUT%: 67.2 % (ref 38.4–76.8)
NEUTROS ABS: 1.7 10*3/uL (ref 1.5–6.5)
Platelets: 179 10*3/uL (ref 145–400)
RBC: 3.46 10*6/uL — AB (ref 3.70–5.45)
RDW: 13.9 % (ref 11.2–14.5)
WBC: 2.5 10*3/uL — AB (ref 3.9–10.3)
lymph#: 0.7 10*3/uL — ABNORMAL LOW (ref 0.9–3.3)

## 2016-01-15 MED ORDER — AZITHROMYCIN 250 MG PO TABS: ORAL_TABLET | ORAL | Status: DC

## 2016-01-15 MED FILL — AZITHROMYCIN 250 MG TABLET: 250 | 5 days supply | Qty: 6 | Fill #0

## 2016-01-15 NOTE — Telephone Encounter (Signed)
per pof to sch pt appt-cx lab per pof (Dr. Burr Medico)

## 2016-01-15 NOTE — Progress Notes (Signed)
[place]  [phone] [phone]  Clinic follow up Note   Patient Care Team: [name], MD as PCP - General  01/15/2016  CHIEF COMPLAINTS Fever and cough     Breast cancer of lower-outer quadrant of right female breast ([place])   09/03/2008 Initial Diagnosis right breast cancer, T2N1Mo, stage IIA, ER+, PR+, HER2+ (ration 2.07). Pt declined surgery or other treatment.     05/20/2011 Tumor Marker right breast mass biopsy showed IDA, ER+/PR+/HER2- (ratio 1.37)   09/04/2011 Cancer Staging PET scan showed no distant mets   10/08/2014 Progression  lumbar spine MRI showed multiple bone metastasis , highly suspicious for  metastatic disease.   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 Increased size of right pleural effusion, indeterminate for malignant involvement of the pleural fluids.     HISTORY OF PRESENTING ILLNESS:  Margaret Rubio 75 y.o. female  Was referred by her primary care physician to discuss the management probable metastatic breast cancer.  She was initially diagnosed with right breast cancer in November 2009. 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 In December of 2009, she underwent a right axillary lymph node biopsy on 09/24/2008 which showed metastatic ductal carcinoma. (Case No: [id number]) The tumor was ER 100% positive, PR 0% negative, Ki67 - 22%. 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 The ratio of HER-2:CEP 17 was 2.07.  She decided to pursue nature therapy by diet and exercise, and  did not have surgery and other tranditional therapy for her breast cancer.   In May 2011 she underwent a mammogram that showed a 3.8 cm invasive duct carcinoma in the subareolar region of the right breast which had increased since November 2009.  The tumor was ER  99% positive,  PR 4% positive, HER-2 negative ( ratio 1.37).  She was seen by Dr. Estella Husk and Dr. Lucia Gaskins.  However, she failed multiple appointments and sought alternative medical care in [place]. This involved putting something on her right breast that caused some scarring.  She noticed the enlarged right breast mass and nipple inversion about one year ago. No pain, but some skin change. She developed left low back/buttock and hip and  pain in Nov 2015, was seen by PCP Dr. Jenny Reichmann who prescribed vicodin and tramadol and pain improved with meds. Due to the persistent pain, she underwent a lumber MRI  On 10/08/2014, which showed multiple bone metastasis.  She run out pain medication about a week ago,  But did not call for refill. She states her pain is not bad , but she appears to be uncomfortable when she stands up and walks.  She otherwise feels well overall,  Has good appetite and energy level. She lives alone and still functions well.  She comes in with her niece today.   Current therapy:  1.Anastrozole 1 mg once daily started on 12/10/2014 2. Ibrance added on 07/29/15, stopped in Nov 2016, restarted on 4/1  3. Xgeva started on 08/15/2015  INTERIM HISTORY: Ms. Burack called this morning for an urgent visit. She developed fever, chills, T100.2, nasal congestion,  productive cough, and fatigue 2 days ago, she stated in the bed all day, improved gradually yesterday and today, but she still has low-grade fever, and still fatigued, with moderate cough and clear mucus production. She has not taken any cold meds. She has restarted anastrozole and Ibrance on 01/10/2016, missed one dose of Ibrance yesterday. She is tolerating them well overall.  No other new complaints   MEDICAL HISTORY:  Past Medical History  Diagnosis Date  . 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 ASTHMA 01/31/2008  . BREAST CANCER, HX OF 11/14/2009  . BRONCHITIS, ACUTE 01/31/2008  . Cough 10/17/2009  . FATIGUE 11/14/2009  . GANGLION CYST 05/01/2009  . HYPERLIPIDEMIA 05/21/2007  . HYPERTENSION 05/05/2007  . LOW BACK PAIN 05/21/2007  . MENOPAUSAL DISORDER 05/22/2010  . Pain in joint, lower leg 05/10/2008  . RASH-NONVESICULAR 05/01/2009  . SHOULDER PAIN, LEFT 11/14/2009  . 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 VITAMIN B12 DEFICIENCY 05/23/2010  . rt breast ca dx'd 2009    right breast    SURGICAL HISTORY: Past Surgical History  Procedure Laterality Date  . Removal of neck lump      benigh 1986  . Myomectomy      SOCIAL HISTORY: History   Social History  . Marital Status: Widowed    Spouse Name: N/A    Number of Children: 84, lives in [place].   . Years of Education: N/A   Occupational History  . Not on file.   Social History Main Topics  . Smoking status: Never Smoker   . Smokeless tobacco: Not on file  . Alcohol Use: No  . Drug Use: No  . Sexual Activity: Not on file   Other Topics Concern  . Not on file   Social History Narrative    FAMILY HISTORY: Family History  Problem Relation Age of Onset  . Asthma Other   . Diabetes Other   . Hypertension Other   . Hypertension Father   . Heart disease Father   Paternal aunt had cancer, unknown type. No other family history of malignancy   ALLERGIES:  is allergic to tomato.  MEDICATIONS:  Current Outpatient Prescriptions  Medication Sig Dispense Refill  . Ascorbic Acid (VITAMIN C) 100 MG CHEW Chew by mouth every morning.    Marland Kitchen aspirin EC 81 MG tablet Take 1 tablet (81 mg total) by mouth daily. 90 tablet 11  . ergocalciferol (VITAMIN D2) 50000 UNITS capsule Take 1 capsule (50,000 Units total) by mouth once a week. 4 capsule 3  . HYDROcodone-acetaminophen (NORCO/VICODIN) 5-325 MG per tablet Take 1 tablet by  mouth every 6 (six) hours as needed for moderate pain. 60 tablet 0  . morphine (MS CONTIN) 15 MG 12 hr tablet Take 1 tablet (15 mg total) by mouth every 12 (twelve) hours. 60 tablet 0  . 0    0  . tiZANidine (ZANAFLEX) 4 MG tablet Take 1 tablet (4 mg total) by mouth every 6 (six) hours as needed for muscle spasms. 40 tablet 1   No current facility-administered medications for this visit.    REVIEW OF SYSTEMS:   Constitutional: Denies fevers, chills or abnormal night sweats Eyes: Denies blurriness of vision, double vision or watery eyes Ears, nose, mouth, throat, and face: Denies mucositis or sore throat Respiratory: Denies cough, dyspnea or wheezes Cardiovascular: Denies palpitation, chest discomfort or lower extremity swelling Gastrointestinal:  Denies nausea, heartburn or change in bowel habits Skin: Denies abnormal skin rashes Lymphatics: Denies new lymphadenopathy or easy bruising Neurological:Denies numbness, tingling  or new weaknesses Behavioral/Psych: Mood is stable, no new changes  All other systems were reviewed with the patient and are negative.  PHYSICAL EXAMINATION: ECOG PERFORMANCE STATUS: 2-3   BP 157/70 mmHg  Pulse 71  Temp(Src) 99.4 F (37.4 C) (Oral)  Resp 18  Ht '5\' 5"'$  (1.651 m)  Wt 192 lb 4.8 oz (87.227 kg)  BMI 32.00 kg/m2  SpO2 95%  GENERAL:alert, no distress and comfortable SKIN: skin color, texture, turgor are normal, no rashes or significant lesions EYES: normal, conjunctiva are pink and non-injected, sclera clear OROPHARYNX:no exudate, no erythema and lips, buccal mucosa, and tongue normal  NECK: supple, thyroid normal size, non-tender, without nodularity LYMPH:  no palpable lymphadenopathy in the cervical, axillary or inguinal LUNGS: clear to auscultation and percussion with normal breathing effort, slightly decreased breath sounds at the bottom of right lung. HEART: regular rate & rhythm and no murmurs and no lower extremity edema ABDOMEN:abdomen  soft, non-tender and normal bowel sounds Musculoskeletal:no cyanosis of digits and no clubbing  PSYCH: alert & oriented x 3 with fluent speech NEURO: no focal motor/sensory deficits Breasts: Breast inspection showed  Significantly enlarged right breast, compared to left breast, (+) skin pigmentations , nipple inversion and a dry ulcer/ scar below the nipple, no discharge.  The right breast is softer than before, nontender , no discrete mass. Palpation of the  Left breasts and axilla revealed no obvious mass that I could appreciate.   LABORATORY DATA:  I have reviewed the data as listed CBC Latest Ref Rng 01/15/2016 01/06/2016 09/29/2015  WBC 3.9 - 10.3 10e3/uL 2.5(L) 4.0 2.9(L)  Hemoglobin 11.6 - 15.9 g/dL 10.1(L) 9.8(L) 9.8(L)  Hematocrit 34.8 - 46.6 % 30.1(L) 29.6(L) 30.4(L)  Platelets 145 - 400 10e3/uL 179 184 203    CMP Latest Ref Rng 01/15/2016 01/06/2016 09/29/2015  Glucose 70 - 140 mg/dl 94 100 86  BUN 7.0 - 26.0 mg/dL 12.9 12.1 14.3  Creatinine 0.6 - 1.1 mg/dL 0.9 0.8 0.8  Sodium 136 - 145 mEq/L 135(L) 139 137  Potassium 3.5 - 5.1 mEq/L 3.8 4.1 5.1  CO2 22 - 29 mEq/L '24 26 26  '$ Calcium 8.4 - 10.4 mg/dL 8.9 9.2 9.6  Total Protein 6.4 - 8.3 g/dL 7.4 7.0 7.3  Total Bilirubin 0.20 - 1.20 mg/dL 0.59 0.37 0.35  Alkaline Phos 40 - 150 U/L 55 53 56  AST 5 - 34 U/L 36(H) 37(H) 23  ALT 0 - 55 U/L '14 25 11    '$ PATHOLOGY REPORT  RIGHT BREAST MASS, 6 O'CLOCK, NEEDLE CORE BIOPSIES: 09/03/2008 MICROSCOPIC FOCUS OF DUCTAL CARCINOMA IN SITU ASSOCIATED WITH MICROSCOPIC FOCUS OF MICROINVASION. SEE COMMENT.  LYMPH NODE, RIGHT AXILLARY, NEEDLE CORE BIOPSIES: METASTATIC DUCTAL CARCINOMA.  09/24/2008  COMMENT There are needle core biopsies of lymph node showing partial replacement by metastatic adenocarcinoma consistent with metastasis from a breast primary, metastatic ductal carcinoma. Since the breast prognostic profile is not performed on the previous material because of limited  invasive tumor ([id number]), the breast prognostic profile will be performed on the tumor in the lymph node and reported in a separate report. Interpretation HER-2/NEU BY CISH -- SHOWS AMPLIFICATION BY CISH ANALYSIS. THE RATIO OF HER-2: CEP 17 SIGNALS WAS 2.07  Diagnosis 05/20/2011 Breast, right, needle core biopsy - INVASIVE DUCTAL CARCINOMA, SEE COMMENT. - LYMPHOVASCULAR INVASION PRESENT. Estrogen Receptor (Negative, <1%): 99%, STRONG STAINING INTENSITY Progesterone Receptor (Negative, <1%): 4%, STRONG HER-2/NEU BY CISH - NO AMPLIFICATION OF HER-2 DETECTED. THE RATIO OF HER-2: CEP 17 SIGNALS WAS 1.38.  Breast, right, needle core biopsy, 6:30 o'clock 12/13/2014 - INVASIVE MAMMARY CARCINOMA. - SEE MICROSCOPIC DESCRIPTION. Microscopic Comment The features favor Grade II invasive ductal carcinoma. A breast prognostic profile will be performed. Dr. Donato Heinz agrees. The results were called to [facility] on 12/16/14. (JDP:ds 12/16/14) ADDENDUM: Immunohistochemistry for E-cadherin is performed and shows diffuse strong positivity consistent with invasive ductal carcinoma. Estrogen Receptor: 100%, POSITIVE, STRONG STAINING INTENSITY Progesterone Receptor: 2%, POSITIVE, STRONG STAINING INTENSITY Proliferation Marker Ki67: 27% HER-2/NEU BY CISH - NEGATIVE. RESULT RATIO OF HER2: CEP 17 SIGNALS 1.45 AVERAGE HER2 COPY NUMBER PER CELL 2.25  Diagnosis 08/01/2015 PLEURAL FLUID, RIGHT(SPECIMEN 1 OF 1 COLLECTED 08/01/15): METASTATIC CARCINOMA CONSISTENT WITH BREAST PRIMARY.   RADIOGRAPHIC STUDIES: I have personally reviewed the radiological images as listed and agreed with the findings in the report.  PET 07/17/2015 IMPRESSION: 1. Suspected active diverticulitis along the sigmoid colon, with focal mesenteric stranding and a small locule of gas which is either within an inflamed diverticulum or a micro perforation. No overt free air. 2. Overall similar appearance of multifocal  right breast cancer with extensive osseous metastatic disease. The right axillary lymph nodes are less notable that there is a borderline hypermetabolic left axillary lymph node currently. 3. Increasing size of the right pleural effusion, now moderate. Appearance indeterminate for malignant involvement of the pleural fluid. 4. Faintly increased activity in a right infrahilar lymph node which could represent early metastatic spread. 5. Other imaging findings of potential clinical significance: Chronic left maxillary sinusitis. Low-density blood pool suggests anemia. Left adrenal adenoma. These results will be called to the ordering clinician or representative by the Radiologist Assistant, and communication documented in the PACS or zVision Dashboard.  Brain MRI with and without contrast on 06/27/2015, at [facility] -In determinate, ill-defined soft tissue infiltration in the left orbit with questionable enhancement, further evaluation with CT orbits with contrast is recommended to the cyst in ruling out metastasis. -No intracranial metastasis -Diffusely hypoechoic intense bone marrow signal throughout the skull base    ASSESSMENT & PLAN:   Margaret Rubio is a 75 year old  African-American female, who was diagnosed with node positive right breast  Invasive adenocarcinoma in  2009, untreated,  Now presents with low back pain and lumbar spine MRI is highly suspicious for bone metastasis.  1. Acute URI -She has developed acute upper respiratory infection symptoms, with fever, productive cough, exam was unremarkable, as her oxygen level was normal. -A given her prescription of Z-Pak today, I encouraged her to drink more warm water, stay warm and the rest well. Okay to use [place] as needed. -We'll hold on her Leslee Home for now until next Tuesday.  2. Right breast IDA,  Strongly ER positive,  PR weakly positive,  HER-2 positive on first biopsy , but  negative on second and third  biopsy 3 and 6 years later, with diffuse bone metastatic and right pleura, T4NxM1 stage IV  - it has been over 6 years since her initial diagnosis. Her lumbar MRI is very suspicious for bone metastasis.  Her disease course is more consistent with ER positive , HER-2 negative disease. - I discussed her initial PET scan results with her and her daughter. 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 -she knows that her disease at this stage is incurable. The goal of treatment is palliative and to prolong her life. -she is on first line anastrozole, Leslee Home was added on in October 2016 due to  slight disease progression. She was not very compliant, off Ibrance for 3-4 months and restarted recently. -Due to her acute URI, I'll hold her Leslee Home for the next for 5 days, and restarted next Tuesday. She'll continue anastrozole.  2. Left frontal headaches, secondary to orbit bone mets -has resolved since she started Ibrance  -continue monitoring   3 Right pleural effusion -Cytology was positive for malignant cells , secondary to her breast cancer.  -We'll monitor her pleural effusion closely    4. Low back/ hip pain,  Secondary to metastatic cancer -this has resolved since she started anastrozole -She starts Xgeva one months ago, tolerated well. We'll continue monthly  5. Anemia -Likely secondary to her breast cancer, bone metastases and the B12 deficiency -Slightly improved after B12 injections. Continue B12 monthly for now -No need for transfusion. We'll follow up closely.  Plan: -I called in [place] for her  -Continue letrozole, we'll hold Ibrance until next Tuesday -I'll see her back on April 25 for lab and follow-up.  All questions were answered. The patient knows to call the clinic with any problems, questions or concerns. I spent 25 minutes counseling the patient face to face. The total time spent in the appointment was 30 minutes and more  than 50% was on counseling.     Truitt Merle, MD 01/15/2016

## 2016-01-15 NOTE — Telephone Encounter (Signed)
Patient Name: Margaret Rubio  DOB: [birth year]    Initial Comment Caller states coughing and has a lot of mucus, did have a fever but it is gone    Nurse Assessment  Nurse: Raphael Gibney, RN, Vanita Ingles Date/Time (Eastern Time): 01/15/2016 9:00:19 AM  Confirm and document reason for call. If symptomatic, describe symptoms. You must click the next button to save text entered. ---Caller states she is congested and she is coughing. Had fever and chills yesterday but not today. She is coughing up white sputum. symptoms started Tuesday night.  Has the patient traveled out of the country within the last 30 days? ---No  Does the patient have any new or worsening symptoms? ---Yes  Will a triage be completed? ---Yes  Related visit to physician within the last 2 weeks? ---No  Does the PT have any chronic conditions? (i.e. diabetes, asthma, etc.) ---No  Is this a behavioral health or substance abuse call? ---No     Guidelines    Guideline Title Affirmed Question Affirmed Notes  Cough - Acute Productive Cough (all triage questions negative)    Final Disposition User   [place], RN, Vanita Ingles    Disagree/Comply: Comply

## 2016-01-16 LAB — CANCER ANTIGEN 27.29: CA 27.29: 402.4 U/mL — ABNORMAL HIGH (ref 0.0–38.6)

## 2016-01-16 LAB — CANCER ANTIGEN 27-29 (PARALLEL TESTING): CA 27.29: 431 U/mL — AB (ref <38)

## 2016-01-20 ENCOUNTER — Encounter (HOSPITAL_COMMUNITY)
Admission: RE | Admit: 2016-01-20 | Discharge: 2016-01-20 | Disposition: A | Discharge status: Home / Self Care | Payer: Medicare PPO | Source: Ambulatory Visit | Attending: Hematology | Admitting: Hematology

## 2016-01-20 ENCOUNTER — Other Ambulatory Visit: Payer: Self-pay

## 2016-01-20 ENCOUNTER — Encounter (HOSPITAL_COMMUNITY): Admission: RE | Admit: 2016-01-20 | Payer: Medicare PPO | Source: Ambulatory Visit

## 2016-01-20 ENCOUNTER — Ambulatory Visit (HOSPITAL_COMMUNITY)
Admission: RE | Admit: 2016-01-20 | Discharge: 2016-01-20 | Disposition: A | Discharge status: Home / Self Care | Payer: Medicare PPO | Source: Ambulatory Visit | Attending: Hematology | Admitting: Hematology

## 2016-01-20 ENCOUNTER — Encounter (HOSPITAL_COMMUNITY): Payer: Self-pay

## 2016-01-20 DIAGNOSIS — N858 Other specified noninflammatory disorders of uterus: Secondary | ICD-10-CM | POA: Diagnosis not present

## 2016-01-20 DIAGNOSIS — I700 Atherosclerosis of aorta: Secondary | ICD-10-CM | POA: Diagnosis not present

## 2016-01-20 DIAGNOSIS — J90 Pleural effusion, not elsewhere classified: Secondary | ICD-10-CM | POA: Diagnosis not present

## 2016-01-20 DIAGNOSIS — C50511 Malignant neoplasm of lower-outer quadrant of right female breast: Secondary | ICD-10-CM | POA: Diagnosis present

## 2016-01-20 DIAGNOSIS — I2510 Atherosclerotic heart disease of native coronary artery without angina pectoris: Secondary | ICD-10-CM | POA: Insufficient documentation

## 2016-01-20 DIAGNOSIS — K5710 Diverticulosis of small intestine without perforation or abscess without bleeding: Secondary | ICD-10-CM | POA: Insufficient documentation

## 2016-01-20 DIAGNOSIS — C7951 Secondary malignant neoplasm of bone: Secondary | ICD-10-CM | POA: Diagnosis not present

## 2016-01-20 DIAGNOSIS — I517 Cardiomegaly: Secondary | ICD-10-CM | POA: Insufficient documentation

## 2016-01-20 MED ORDER — IOPAMIDOL (ISOVUE-300) INJECTION 61%
100.0000 mL | Freq: Once | INTRAVENOUS | Status: AC | PRN
  Administered 2016-01-20: 100 mL via INTRAVENOUS

## 2016-01-20 MED ORDER — FLUDEOXYGLUCOSE F - 18 (FDG) INJECTION
25.3000 | Freq: Once | INTRAVENOUS | Status: AC | PRN
  Administered 2016-01-20: 25.3 via INTRAVENOUS

## 2016-01-22 ENCOUNTER — Telehealth: Payer: Self-pay | Admitting: *Deleted

## 2016-01-22 ENCOUNTER — Other Ambulatory Visit: Payer: Self-pay | Admitting: Hematology

## 2016-01-22 NOTE — Telephone Encounter (Signed)
I called patient back, reviewed her CT chest abdomen pelvis and bone scan from 01/20/2016, which showed small pleural effusion, stable disease overall.   I think her dyspnea on exertion he could be related to her recent acute bronchitis, she denies any chest pain, fever, or other new symptoms, is able to function well at home. She knows to call us back if her symptoms gets worse.   I told her to start Ibrance next Monday if her symptom does not get worse.   I'll see her back on April 25 as it scheduled.  Truitt Merle  01/22/2016

## 2016-01-22 NOTE — Telephone Encounter (Signed)
Received vm call from pt stating that she had finished a z-pak & feels better in terms of her bronchitis but is concerned b/c she gets out of breath with walking short distances.  She still has some wheezing but better & has had some dizziness.   Returned call & she states she has no energy.  She denies, n/v, fever, or diarrhea & states she is drinking plenty of fluids.  She also states that she had a CT & bone scan that she hasn't heard about.  Message to Dr Burr Medico & call back # is [phone]

## 2016-02-03 ENCOUNTER — Ambulatory Visit (HOSPITAL_BASED_OUTPATIENT_CLINIC_OR_DEPARTMENT_OTHER): Payer: Medicare PPO | Admitting: Hematology

## 2016-02-03 ENCOUNTER — Other Ambulatory Visit (HOSPITAL_BASED_OUTPATIENT_CLINIC_OR_DEPARTMENT_OTHER): Payer: Medicare PPO

## 2016-02-03 ENCOUNTER — Ambulatory Visit (HOSPITAL_BASED_OUTPATIENT_CLINIC_OR_DEPARTMENT_OTHER): Payer: Medicare PPO

## 2016-02-03 ENCOUNTER — Other Ambulatory Visit: Payer: Self-pay

## 2016-02-03 ENCOUNTER — Telehealth: Payer: Self-pay | Admitting: Hematology

## 2016-02-03 ENCOUNTER — Encounter: Payer: Self-pay | Admitting: Hematology

## 2016-02-03 VITALS — BP 149/77 | HR 67 | Temp 97.8°F | Resp 18 | Ht 65.0 in | Wt 196.5 lb

## 2016-02-03 DIAGNOSIS — J90 Pleural effusion, not elsewhere classified: Secondary | ICD-10-CM | POA: Diagnosis not present

## 2016-02-03 DIAGNOSIS — E538 Deficiency of other specified B group vitamins: Secondary | ICD-10-CM

## 2016-02-03 DIAGNOSIS — C7951 Secondary malignant neoplasm of bone: Secondary | ICD-10-CM | POA: Diagnosis not present

## 2016-02-03 DIAGNOSIS — D649 Anemia, unspecified: Secondary | ICD-10-CM | POA: Diagnosis not present

## 2016-02-03 DIAGNOSIS — I10 Essential (primary) hypertension: Secondary | ICD-10-CM

## 2016-02-03 DIAGNOSIS — C50511 Malignant neoplasm of lower-outer quadrant of right female breast: Secondary | ICD-10-CM

## 2016-02-03 DIAGNOSIS — C50911 Malignant neoplasm of unspecified site of right female breast: Secondary | ICD-10-CM

## 2016-02-03 DIAGNOSIS — Z170 Estrogen receptor positive status [ER+]: Secondary | ICD-10-CM

## 2016-02-03 DIAGNOSIS — D630 Anemia in neoplastic disease: Secondary | ICD-10-CM

## 2016-02-03 LAB — COMPREHENSIVE METABOLIC PANEL WITH GFR
ALT: 15 U/L (ref 0–55)
AST: 32 U/L (ref 5–34)
Albumin: 3.5 g/dL (ref 3.5–5.0)
Alkaline Phosphatase: 44 U/L (ref 40–150)
Anion Gap: 6 meq/L (ref 3–11)
BUN: 10.5 mg/dL (ref 7.0–26.0)
CO2: 25 meq/L (ref 22–29)
Calcium: 9 mg/dL (ref 8.4–10.4)
Chloride: 106 meq/L (ref 98–109)
Creatinine: 0.7 mg/dL (ref 0.6–1.1)
EGFR: >90 ml/min/1.73 m2
Glucose: 85 mg/dL (ref 70–140)
Potassium: 4.2 meq/L (ref 3.5–5.1)
Sodium: 137 meq/L (ref 136–145)
Total Bilirubin: 0.41 mg/dL (ref 0.20–1.20)
Total Protein: 6.6 g/dL (ref 6.4–8.3)

## 2016-02-03 LAB — CBC WITH DIFFERENTIAL/PLATELET
BASO%: 0.4 % (ref 0.0–2.0)
Basophils Absolute: 0 10e3/uL (ref 0.0–0.1)
EOS%: 0.4 % (ref 0.0–7.0)
Eosinophils Absolute: 0 10e3/uL (ref 0.0–0.5)
HCT: 28.5 % — ABNORMAL LOW (ref 34.8–46.6)
HGB: 9.1 g/dL — ABNORMAL LOW (ref 11.6–15.9)
LYMPH%: 35.2 % (ref 14.0–49.7)
MCH: 29.3 pg (ref 25.1–34.0)
MCHC: 32 g/dL (ref 31.5–36.0)
MCV: 91.6 fL (ref 79.5–101.0)
MONO#: 0.4 10e3/uL (ref 0.1–0.9)
MONO%: 10.8 % (ref 0.0–14.0)
NEUT#: 2 10e3/uL (ref 1.5–6.5)
NEUT%: 53.2 % (ref 38.4–76.8)
Platelets: 204 10e3/uL (ref 145–400)
RBC: 3.11 10e6/uL — ABNORMAL LOW (ref 3.70–5.45)
RDW: 15.5 % — ABNORMAL HIGH (ref 11.2–14.5)
WBC: 3.7 10e3/uL — ABNORMAL LOW (ref 3.9–10.3)
lymph#: 1.3 10e3/uL (ref 0.9–3.3)

## 2016-02-03 MED ORDER — CYANOCOBALAMIN 1000 MCG/ML IJ SOLN
1000.0000 ug | Freq: Once | INTRAMUSCULAR | Status: AC
  Administered 2016-02-03: 1000 ug via INTRAMUSCULAR

## 2016-02-03 MED ORDER — DENOSUMAB 120 MG/1.7ML ~~LOC~~ SOLN
120.0000 mg | Freq: Once | SUBCUTANEOUS | Status: AC
  Administered 2016-02-03: 120 mg via SUBCUTANEOUS
  Filled 2016-02-03: qty 1.7

## 2016-02-03 MED FILL — *IBRANCE 125 MG CAPSULE: 125 | 28 days supply | Qty: 21 | Fill #0

## 2016-02-03 NOTE — Progress Notes (Signed)
[place]  [phone] Fax:([phone]) [phone]  Clinic follow up Note   Patient Care Team: [name], MD as PCP - General  02/03/2016  CHIEF COMPLAINTS Follow up breast cancer     Breast cancer of lower-outer quadrant of right female breast ([place])   09/03/2008 Initial Diagnosis right breast cancer, T2N1Mo, stage IIA, ER+, PR+, HER2+ (ration 2.07). Pt declined surgery or other treatment.     05/20/2011 Tumor Marker right breast mass biopsy showed IDA, ER+/PR+/HER2- (ratio 1.37)   09/04/2011 Cancer Staging PET scan showed no distant mets   10/08/2014 Progression  lumbar spine MRI showed multiple bone metastasis , highly suspicious for  metastatic disease.   12/13/2014 Pathology Results Right breast needle biopsy showed invasive ductal carcinoma, ER 100% positive, PR 2% positive, HER-2 negative with ratio 1.45 and copy number 2.25   12/14/2014 -  Anti-estrogen oral therapy Arimidex 28m daily, Ibrance added on 07/29/2015   07/17/2015 Imaging PET scan showed overall similar appearance of multifocal right breast cancer with extensive bone metastasis. Increased size of right pleural effusion, indeterminate for malignant involvement of the pleural fluids.   01/20/2016 Imaging CT CAP with contrast showed small bilateral pleural effusion, right greater than left, similar osseous metastasis, no evidence of extraosseous metastasis. Bone scan showed no areas of suspicious uptake.     HISTORY OF PRESENTING ILLNESS:  Margaret [id number]. female  Was referred by her primary care physician to discuss the management probable metastatic breast cancer.  She was initially diagnosed with right breast cancer in November 2009. 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 In December of 2009, she underwent a right axillary lymph node biopsy on 09/24/2008 which showed metastatic ductal  carcinoma. (Case No: [id number] The tumor was ER 100% positive, PR 0% negative, Ki67 - 22%. 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 The ratio of HER-2:CEP 17 was 2.07.  She decided to pursue nature therapy by diet and exercise, and did not have surgery and other tranditional therapy for her breast cancer.   In May 2011 she underwent a mammogram that showed a 3.8 cm invasive duct carcinoma in the subareolar region of the right breast which had increased since November 2009.  The tumor was ER  99% positive,  PR 4% positive, HER-2 negative ( ratio 1.37).  She was seen by Dr. EEstella Huskand Dr. NLucia Gaskins  However, she failed multiple appointments and sought alternative medical care in [place] This involved putting something on her right breast that caused some scarring.  She noticed the enlarged right breast mass and nipple inversion about one year ago. No pain, but some skin change. She developed left low back/buttock and hip and  pain in Nov 2015, was seen by PCP Dr. JJenny Reichmannwho prescribed vicodin and tramadol and pain improved with meds. Due to the persistent pain, she underwent a lumber MRI  On 10/08/2014, which showed multiple bone metastasis.  She run out pain medication about a week ago,  But did not call for refill. She states her pain is not bad , but she appears to be uncomfortable when she stands up and walks.  She otherwise feels well overall,  Has good appetite and energy level. She lives alone and still functions well.  She comes in with her niece today.   Current therapy:  1.Anastrozole 1 mg once daily started on 12/10/2014 2. Ibrance added  on 07/29/15, stopped in Nov 2016, restarted on 4/1  3. Xgeva started on 08/15/2015  INTERIM HISTORY: [name] returns for follow-up. She finished the course of azithromycin, her cough and dyspnea has much improved, she feels well overall, denies any significant dyspnea on exertion. She has good appetite and eating well. No fever or chills, no other  new complaints. She has been off Ibrance since I saw her about 3 weeks ago   MEDICAL HISTORY:  Past Medical History  Diagnosis Date  . 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 ASTHMA 01/31/2008  . BREAST CANCER, HX OF 11/14/2009  . BRONCHITIS, ACUTE 01/31/2008  . Cough 10/17/2009  . FATIGUE 11/14/2009  . GANGLION CYST 05/01/2009  . HYPERLIPIDEMIA 05/21/2007  . HYPERTENSION 05/05/2007  . LOW BACK PAIN 05/21/2007  . MENOPAUSAL DISORDER 05/22/2010  . Pain in joint, lower leg 05/10/2008  . RASH-NONVESICULAR 05/01/2009  . SHOULDER PAIN, LEFT 11/14/2009  . 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 VITAMIN B12 DEFICIENCY 05/23/2010  . rt breast ca dx'd 2009    right breast    SURGICAL HISTORY: Past Surgical History  Procedure Laterality Date  . Removal of neck lump      benigh 1986  . Myomectomy      SOCIAL HISTORY: History   Social History  . Marital Status: Widowed    Spouse Name: N/A    Number of Children: 36, lives in [place].   . Years of Education: N/A   Occupational History  . Not on file.   Social History Main Topics  . Smoking status: Never Smoker   . Smokeless tobacco: Not on file  . Alcohol Use: No  . Drug Use: No  . Sexual Activity: Not on file   Other Topics Concern  . Not on file   Social History Narrative    FAMILY HISTORY: Family History  Problem Relation Age of Onset  . Asthma Other   . Diabetes Other   . Hypertension Other   . Hypertension Father   . Heart disease Father   Paternal aunt had cancer, unknown type. No other family history of malignancy   ALLERGIES:  is allergic to tomato.  MEDICATIONS:  Current Outpatient Prescriptions  Medication Sig Dispense Refill  . Ascorbic Acid (VITAMIN C) 100 MG CHEW Chew by mouth every morning.    Marland Kitchen aspirin EC 81 MG tablet Take 1 tablet (81 mg total) by mouth daily. 90 tablet 11  . ergocalciferol (VITAMIN D2) 50000 UNITS capsule Take 1 capsule (50,000 Units total) by mouth once a week. 4 capsule 3  . HYDROcodone-acetaminophen  (NORCO/VICODIN) 5-325 MG per tablet Take 1 tablet by mouth every 6 (six) hours as needed for moderate pain. 60 tablet 0  . morphine (MS CONTIN) 15 MG 12 hr tablet Take 1 tablet (15 mg total) by mouth every 12 (twelve) hours. 60 tablet 0  . 0    0  . tiZANidine (ZANAFLEX) 4 MG tablet Take 1 tablet (4 mg total) by mouth every 6 (six) hours as needed for muscle spasms. 40 tablet 1   No current facility-administered medications for this visit.    REVIEW OF SYSTEMS:   Constitutional: Denies fevers, chills or abnormal night sweats Eyes: Denies blurriness of vision, double vision or watery eyes Ears, nose, mouth, throat, and face: Denies mucositis or sore throat Respiratory: Denies cough, dyspnea or wheezes Cardiovascular: Denies palpitation, chest discomfort or lower extremity swelling Gastrointestinal:  Denies nausea, heartburn or change in bowel habits Skin: Denies abnormal skin rashes Lymphatics: Denies  new lymphadenopathy or easy bruising Neurological:Denies numbness, tingling or new weaknesses Behavioral/Psych: Mood is stable, no new changes  All other systems were reviewed with the patient and are negative.  PHYSICAL EXAMINATION: ECOG PERFORMANCE STATUS: 1 BP 149/77 mmHg  Pulse 67  Temp(Src) 97.8 F (36.6 C) (Oral)  Resp 18  Ht _0  (1.651 m)  Wt 196 lb 8 oz (89.132 kg)  BMI 32.70 kg/m2  SpO2 100%  GENERAL:alert, no distress and comfortable SKIN: skin color, texture, turgor are normal, no rashes or significant lesions EYES: normal, conjunctiva are pink and non-injected, sclera clear OROPHARYNX:no exudate, no erythema and lips, buccal mucosa, and tongue normal  NECK: supple, thyroid normal size, non-tender, without nodularity LYMPH:  no palpable lymphadenopathy in the cervical, axillary or inguinal LUNGS: clear to auscultation and percussion with normal breathing effort, slightly decreased breath sounds at the bottom of right lung. HEART: regular rate & rhythm and no murmurs  and no lower extremity edema ABDOMEN:abdomen soft, non-tender and normal bowel sounds Musculoskeletal:no cyanosis of digits and no clubbing  PSYCH: alert & oriented x 3 with fluent speech NEURO: no focal motor/sensory deficits Breasts: Breast inspection showed  Significantly enlarged right breast, compared to left breast, (+) skin pigmentations , nipple inversion and a dry ulcer/ scar below the nipple, no discharge.  The right breast is softer than before, nontender , no discrete mass. Palpation of the  Left breasts and axilla revealed no obvious mass that I could appreciate.   LABORATORY DATA:  I have reviewed the data as listed CBC Latest Ref Rng 02/03/2016 01/15/2016 01/06/2016  WBC 3.9 - 10.3 10e3/uL 3.7(L) 2.5(L) 4.0  Hemoglobin 11.6 - 15.9 g/dL 9.1(L) 10.1(L) 9.8(L)  Hematocrit 34.8 - 46.6 % 28.5(L) 30.1(L) 29.6(L)  Platelets 145 - 400 10e3/uL 204 179 184    CMP Latest Ref Rng 02/03/2016 01/15/2016 01/06/2016  Glucose 70 - 140 mg/dl 85 94 100  BUN 7.0 - 26.0 mg/dL 10.5 12.9 12.1  Creatinine 0.6 - 1.1 mg/dL 0.7 0.9 0.8  Sodium 136 - 145 mEq/L 137 135(L) 139  Potassium 3.5 - 5.1 mEq/L 4.2 3.8 4.1  CO2 22 - 29 mEq/L _1 Calcium 8.4 - 10.4 mg/dL 9.0 8.9 9.2  Total Protein 6.4 - 8.3 g/dL 6.6 7.4 7.0  Total Bilirubin 0.20 - 1.20 mg/dL 0.41 0.59 0.37  Alkaline Phos 40 - 150 U/L 44 55 53  AST 5 - 34 U/L 32 36(H) 37(H)  ALT 0 - 55 U/L _2 PATHOLOGY REPORT  RIGHT BREAST MASS, 6 O'CLOCK, NEEDLE CORE BIOPSIES: 09/03/2008 MICROSCOPIC FOCUS OF DUCTAL CARCINOMA IN SITU ASSOCIATED WITH MICROSCOPIC FOCUS OF MICROINVASION. SEE COMMENT.  LYMPH NODE, RIGHT AXILLARY, NEEDLE CORE BIOPSIES: METASTATIC DUCTAL CARCINOMA.  09/24/2008  COMMENT There are needle core biopsies of lymph node showing partial replacement by metastatic adenocarcinoma consistent with metastasis from a breast primary, metastatic ductal carcinoma. Since the breast prognostic profile is not performed on  the previous material because of limited invasive tumor ([id number]), the breast prognostic profile will be performed on the tumor in the lymph node and reported in a separate report. Interpretation HER-2/NEU BY CISH -- SHOWS AMPLIFICATION BY CISH ANALYSIS. THE RATIO OF HER-2: CEP 17 SIGNALS WAS 2.07  Diagnosis 05/20/2011 Breast, right, needle core biopsy - INVASIVE DUCTAL CARCINOMA, SEE COMMENT. - LYMPHOVASCULAR INVASION PRESENT. Estrogen Receptor (Negative, <1%): 99%, STRONG STAINING INTENSITY Progesterone Receptor (Negative, <1%): 4%, STRONG HER-2/NEU BY CISH - NO AMPLIFICATION OF HER-2 DETECTED. THE RATIO OF  HER-2: CEP 17 SIGNALS WAS 1.38.  Breast, right, needle core biopsy, 6:30 o'clock 12/13/2014 - INVASIVE MAMMARY CARCINOMA. - SEE MICROSCOPIC DESCRIPTION. Microscopic Comment The features favor Grade II invasive ductal carcinoma. A breast prognostic profile will be performed. Dr. Donato Heinz agrees. The results were called to [facility] on 12/16/14. (JDP:ds 12/16/14) ADDENDUM: Immunohistochemistry for E-cadherin is performed and shows diffuse strong positivity consistent with invasive ductal carcinoma. Estrogen Receptor: 100%, POSITIVE, STRONG STAINING INTENSITY Progesterone Receptor: 2%, POSITIVE, STRONG STAINING INTENSITY Proliferation Marker Ki67: 27% HER-2/NEU BY CISH - NEGATIVE. RESULT RATIO OF HER2: CEP 17 SIGNALS 1.45 AVERAGE HER2 COPY NUMBER PER CELL 2.25  Diagnosis 08/01/2015 PLEURAL FLUID, RIGHT(SPECIMEN 1 OF 1 COLLECTED 08/01/15): METASTATIC CARCINOMA CONSISTENT WITH BREAST PRIMARY.   RADIOGRAPHIC STUDIES: I have personally reviewed the radiological images as listed and agreed with the findings in the report.  PET 07/17/2015 IMPRESSION: 1. Suspected active diverticulitis along the sigmoid colon, with focal mesenteric stranding and a small locule of gas which is either within an inflamed diverticulum or a micro perforation. No overt free  air. 2. Overall similar appearance of multifocal right breast cancer with extensive osseous metastatic disease. The right axillary lymph nodes are less notable that there is a borderline hypermetabolic left axillary lymph node currently. 3. Increasing size of the right pleural effusion, now moderate. Appearance indeterminate for malignant involvement of the pleural fluid. 4. Faintly increased activity in a right infrahilar lymph node which could represent early metastatic spread. 5. Other imaging findings of potential clinical significance: Chronic left maxillary sinusitis. Low-density blood pool suggests anemia. Left adrenal adenoma. These results will be called to the ordering clinician or representative by the Radiologist Assistant, and communication documented in the PACS or zVision Dashboard.  Brain MRI with and without contrast on 06/27/2015, at [facility] -In determinate, ill-defined soft tissue infiltration in the left orbit with questionable enhancement, further evaluation with CT orbits with contrast is recommended to the cyst in ruling out metastasis. -No intracranial metastasis -Diffusely hypoechoic intense bone marrow signal throughout the skull base   Bone scan 01/20/2016 FINDINGS: No areas of suspicious bony uptake to suggest osseous metastatic disease. Soft tissue activity is normal.  IMPRESSION: No evidence of bony metastatic disease.  CT chest, abdomen and pelvis with contrast 01/20/2016 IMPRESSION: CT CHEST IMPRESSION  1. Right greater the left pleural effusions, similar on the right and slightly increased on the left. 2. Similar osseous metastasis. No evidence of extraosseous metastasis within the chest. 3. Atherosclerosis, including within the coronary arteries.  CT ABDOMEN AND PELVIS IMPRESSION  1. Similar osseous metastasis. 2. No extraosseous metastatic disease identified within the abdomen or  pelvis.   ASSESSMENT & PLAN:   Margaret Rubio is a 75 year old  African-American female, who was diagnosed with node positive right breast  Invasive adenocarcinoma in  2009, untreated,  Now presents with low back pain and lumbar spine MRI is highly suspicious for bone metastasis.  1. Right breast IDA,  Strongly ER positive,  PR weakly positive,  HER-2 positive on first biopsy , but negative on second and third  biopsy 3 and 6 years later, with diffuse bone metastatic and right pleura, T4NxM1 stage IV  - it has been over 6 years since her initial diagnosis. Her lumbar MRI is very suspicious for bone metastasis.  Her disease course is more consistent with ER positive , HER-2 negative disease. - I discussed her initial PET scan results with her and her daughter. Both  MRI and PET scan are consistent with diffuse bone metastasis. -she knows that her disease at this stage is incurable. The goal of treatment is palliative and to prolong her life. -she is on first line anastrozole, Leslee Home was added on in October 2016 due to slight disease progression. She was not very compliant, off Ibrance for 3-4 months and restarted recently. -I discussed her restaging CT and bone scan from 01/20/2016, which showed diffuse  Sclerotic bone lesions, but are not active on the bone scan. No other new lesions. She has stable disease. -she has recovered very well from the recent upper respiratory infection, lab results reviewed with her, adequate for treatment, we will start Ibrance in the next few days. She'll continue anastrozole.  2. Bilateral pleural effusions, R>L -Cytology was positive for malignant cells , secondary to her breast cancer.  -We'll monitor her pleural effusion closely    4. Low back/ hip pain,  Secondary to metastatic cancer -this has resolved since she started anastrozole -continue Xgeva monthly  5. Anemia -Likely secondary to her breast cancer, bone metastases and the B12 deficiency -Slightly  improved after B12 injections. Continue B12 monthly for now -No need for transfusion. We'll follow up closely.  Plan: -Xgeva and B12 injection today, and every 4 weeks -she will start Ibrance in the next few days, and continue anastrozole -I'll see you back in 4 weeks with lab and injections.  All questions were answered. The patient knows to call the clinic with any problems, questions or concerns. I spent 20 minutes counseling the patient face to face. The total time spent in the appointment was 25 minutes and more than 50% was on counseling.     Truitt Merle, MD 02/03/2016

## 2016-02-03 NOTE — Telephone Encounter (Signed)
per pof to sch pt appt-gave pt copy of avs °

## 2016-02-04 LAB — CANCER ANTIGEN 27.29: CAN 27.29: 415.9 U/mL — AB (ref 0.0–38.6)

## 2016-02-20 MED FILL — ANASTROZOLE 1 MG TABLET: 1 | 30 days supply | Qty: 30 | Fill #2

## 2016-03-02 ENCOUNTER — Telehealth: Payer: Self-pay | Admitting: Hematology

## 2016-03-02 ENCOUNTER — Ambulatory Visit (HOSPITAL_BASED_OUTPATIENT_CLINIC_OR_DEPARTMENT_OTHER): Payer: Medicare PPO

## 2016-03-02 ENCOUNTER — Ambulatory Visit (HOSPITAL_BASED_OUTPATIENT_CLINIC_OR_DEPARTMENT_OTHER): Payer: Medicare PPO | Admitting: Hematology

## 2016-03-02 ENCOUNTER — Other Ambulatory Visit (HOSPITAL_BASED_OUTPATIENT_CLINIC_OR_DEPARTMENT_OTHER): Payer: Medicare PPO

## 2016-03-02 ENCOUNTER — Encounter: Payer: Self-pay | Admitting: Hematology

## 2016-03-02 VITALS — BP 148/66 | HR 63 | Temp 98.2°F | Resp 17 | Wt 201.7 lb

## 2016-03-02 DIAGNOSIS — C50511 Malignant neoplasm of lower-outer quadrant of right female breast: Secondary | ICD-10-CM | POA: Diagnosis not present

## 2016-03-02 DIAGNOSIS — G893 Neoplasm related pain (acute) (chronic): Secondary | ICD-10-CM | POA: Diagnosis not present

## 2016-03-02 DIAGNOSIS — I10 Essential (primary) hypertension: Secondary | ICD-10-CM

## 2016-03-02 DIAGNOSIS — J910 Malignant pleural effusion: Secondary | ICD-10-CM | POA: Diagnosis not present

## 2016-03-02 DIAGNOSIS — E538 Deficiency of other specified B group vitamins: Secondary | ICD-10-CM

## 2016-03-02 DIAGNOSIS — C50911 Malignant neoplasm of unspecified site of right female breast: Secondary | ICD-10-CM

## 2016-03-02 DIAGNOSIS — Z170 Estrogen receptor positive status [ER+]: Secondary | ICD-10-CM

## 2016-03-02 DIAGNOSIS — C7951 Secondary malignant neoplasm of bone: Secondary | ICD-10-CM

## 2016-03-02 DIAGNOSIS — J90 Pleural effusion, not elsewhere classified: Secondary | ICD-10-CM

## 2016-03-02 DIAGNOSIS — D630 Anemia in neoplastic disease: Secondary | ICD-10-CM

## 2016-03-02 DIAGNOSIS — D649 Anemia, unspecified: Secondary | ICD-10-CM

## 2016-03-02 LAB — CBC WITH DIFFERENTIAL/PLATELET
BASO%: 0.6 % (ref 0.0–2.0)
BASOS ABS: 0 10*3/uL (ref 0.0–0.1)
EOS ABS: 0 10*3/uL (ref 0.0–0.5)
EOS%: 0.8 % (ref 0.0–7.0)
HEMATOCRIT: 27.9 % — AB (ref 34.8–46.6)
HEMOGLOBIN: 9.1 g/dL — AB (ref 11.6–15.9)
LYMPH#: 1 10*3/uL (ref 0.9–3.3)
LYMPH%: 43.8 % (ref 14.0–49.7)
MCH: 30.9 pg (ref 25.1–34.0)
MCHC: 32.8 g/dL (ref 31.5–36.0)
MCV: 94.2 fL (ref 79.5–101.0)
MONO#: 0.2 10*3/uL (ref 0.1–0.9)
MONO%: 9.3 % (ref 0.0–14.0)
NEUT#: 1.1 10*3/uL — ABNORMAL LOW (ref 1.5–6.5)
NEUT%: 45.5 % (ref 38.4–76.8)
PLATELETS: 183 10*3/uL (ref 145–400)
RBC: 2.96 10*6/uL — ABNORMAL LOW (ref 3.70–5.45)
RDW: 16.7 % — AB (ref 11.2–14.5)
WBC: 2.4 10*3/uL — ABNORMAL LOW (ref 3.9–10.3)

## 2016-03-02 LAB — COMPREHENSIVE METABOLIC PANEL
ALBUMIN: 3.7 g/dL (ref 3.5–5.0)
ALT: 9 U/L (ref 0–55)
ANION GAP: 8 meq/L (ref 3–11)
AST: 22 U/L (ref 5–34)
Alkaline Phosphatase: 59 U/L (ref 40–150)
BUN: 10.9 mg/dL (ref 7.0–26.0)
CALCIUM: 8.9 mg/dL (ref 8.4–10.4)
CO2: 24 mEq/L (ref 22–29)
Chloride: 106 mEq/L (ref 98–109)
Creatinine: 0.7 mg/dL (ref 0.6–1.1)
EGFR: >90 mL/min/{1.73_m2} (ref >90)
GLUCOSE: 87 mg/dL (ref 70–140)
POTASSIUM: 3.9 meq/L (ref 3.5–5.1)
Sodium: 137 mEq/L (ref 136–145)
Total Bilirubin: 0.33 mg/dL (ref 0.20–1.20)
Total Protein: 7 g/dL (ref 6.4–8.3)

## 2016-03-02 MED ORDER — DENOSUMAB 120 MG/1.7ML ~~LOC~~ SOLN
120.0000 mg | Freq: Once | SUBCUTANEOUS | Status: AC
  Administered 2016-03-02: 120 mg via SUBCUTANEOUS
  Filled 2016-03-02: qty 1.7

## 2016-03-02 MED ORDER — CYANOCOBALAMIN 1000 MCG/ML IJ SOLN
1000.0000 ug | Freq: Once | INTRAMUSCULAR | Status: AC
  Administered 2016-03-02: 1000 ug via INTRAMUSCULAR

## 2016-03-02 MED ORDER — ANASTROZOLE 1 MG PO TABS: 1.0000 mg | ORAL_TABLET | Freq: Every day | ORAL | Status: DC

## 2016-03-02 NOTE — Telephone Encounter (Signed)
Gave and pritned appt sched and avs for June

## 2016-03-02 NOTE — Patient Instructions (Signed)
Take Calcium with Vitamin D supplements two times daily.  Denosumab injection What is this medicine? DENOSUMAB (den oh sue mab) slows bone breakdown. Prolia is used to treat osteoporosis in women after menopause and in men. Delton See is used to prevent bone fractures and other bone problems caused by cancer bone metastases. Delton See is also used to treat giant cell tumor of the bone. This medicine may be used for other purposes; ask your health care provider or pharmacist if you have questions. What should I tell my health care provider before I take this medicine? They need to know if you have any of these conditions: -dental disease -eczema -infection or history of infections -kidney disease or on dialysis -low blood calcium or vitamin D -malabsorption syndrome -scheduled to have surgery or tooth extraction -taking medicine that contains denosumab -thyroid or parathyroid disease -an unusual reaction to denosumab, other medicines, foods, dyes, or preservatives -pregnant or trying to get pregnant -breast-feeding How should I use this medicine? This medicine is for injection under the skin. It is given by a health care professional in a hospital or clinic setting. If you are getting Prolia, a special MedGuide will be given to you by the pharmacist with each prescription and refill. Be sure to read this information carefully each time. For Prolia, talk to your pediatrician regarding the use of this medicine in children. Special care may be needed. For Delton See, talk to your pediatrician regarding the use of this medicine in children. While this drug may be prescribed for children as young as 13 years for selected conditions, precautions do apply. Overdosage: If you think you have taken too much of this medicine contact a poison control center or emergency room at once. NOTE: This medicine is only for you. Do not share this medicine with others. What if I miss a dose? It is important not to miss your  dose. Call your doctor or health care professional if you are unable to keep an appointment. What may interact with this medicine? Do not take this medicine with any of the following medications: -other medicines containing denosumab This medicine may also interact with the following medications: -medicines that suppress the immune system -medicines that treat cancer -steroid medicines like prednisone or cortisone This list may not describe all possible interactions. Give your health care provider a list of all the medicines, herbs, non-prescription drugs, or dietary supplements you use. Also tell them if you smoke, drink alcohol, or use illegal drugs. Some items may interact with your medicine. What should I watch for while using this medicine? Visit your doctor or health care professional for regular checks on your progress. Your doctor or health care professional may order blood tests and other tests to see how you are doing. Call your doctor or health care professional if you get a cold or other infection while receiving this medicine. Do not treat yourself. This medicine may decrease your body's ability to fight infection. You should make sure you get enough calcium and vitamin D while you are taking this medicine, unless your doctor tells you not to. Discuss the foods you eat and the vitamins you take with your health care professional. See your dentist regularly. Brush and floss your teeth as directed. Before you have any dental work done, tell your dentist you are receiving this medicine. Do not become pregnant while taking this medicine or for 5 months after stopping it. Women should inform their doctor if they wish to become pregnant or think they might  be pregnant. There is a potential for serious side effects to an unborn child. Talk to your health care professional or pharmacist for more information. What side effects may I notice from receiving this medicine? Side effects that you  should report to your doctor or health care professional as soon as possible: -allergic reactions like skin rash, itching or hives, swelling of the face, lips, or tongue -breathing problems -chest pain -fast, irregular heartbeat -feeling faint or lightheaded, falls -fever, chills, or any other sign of infection -muscle spasms, tightening, or twitches -numbness or tingling -skin blisters or bumps, or is dry, peels, or red -slow healing or unexplained pain in the mouth or jaw -unusual bleeding or bruising Side effects that usually do not require medical attention (Report these to your doctor or health care professional if they continue or are bothersome.): -muscle pain -stomach upset, gas This list may not describe all possible side effects. Call your doctor for medical advice about side effects. You may report side effects to FDA at 1-800-FDA-1088. Where should I keep my medicine? This medicine is only given in a clinic, doctor's office, or other health care setting and will not be stored at home. NOTE: This sheet is a summary. It may not cover all possible information. If you have questions about this medicine, talk to your doctor, pharmacist, or health care provider.    2016, [employer]. (2012-03-27 12:37:47) Cyanocobalamin, Vitamin B12 injection What is this medicine? CYANOCOBALAMIN (sye an oh koe BAL a min) is a man made form of vitamin B12. Vitamin B12 is used in the growth of healthy blood cells, nerve cells, and proteins in the body. It also helps with the metabolism of fats and carbohydrates. This medicine is used to treat people who can not absorb vitamin B12. This medicine may be used for other purposes; ask your health care provider or pharmacist if you have questions. COMMON BRAND NAME(S): Cyomin, LA-12, Nutri-Twelve, Primabalt What should I tell my health care provider before I take this medicine? They need to know if you have any of these conditions: -kidney  disease -Leber's disease -megaloblastic anemia -an unusual or allergic reaction to cyanocobalamin, cobalt, other medicines, foods, dyes, or preservatives -pregnant or trying to get pregnant -breast-feeding How should I use this medicine? This medicine is injected into a muscle or deeply under the skin. It is usually given by a health care professional in a clinic or doctor's office. However, your doctor may teach you how to inject yourself. Follow all instructions. Talk to your pediatrician regarding the use of this medicine in children. Special care may be needed. Overdosage: If you think you have taken too much of this medicine contact a poison control center or emergency room at once. NOTE: This medicine is only for you. Do not share this medicine with others. What if I miss a dose? If you are given your dose at a clinic or doctor's office, call to reschedule your appointment. If you give your own injections and you miss a dose, take it as soon as you can. If it is almost time for your next dose, take only that dose. Do not take double or extra doses. What may interact with this medicine? -colchicine -heavy alcohol intake This list may not describe all possible interactions. Give your health care provider a list of all the medicines, herbs, non-prescription drugs, or dietary supplements you use. Also tell them if you smoke, drink alcohol, or use illegal drugs. Some items may interact with your medicine.  What should I watch for while using this medicine? Visit your doctor or health care professional regularly. You may need blood work done while you are taking this medicine. You may need to follow a special diet. Talk to your doctor. Limit your alcohol intake and avoid smoking to get the best benefit. What side effects may I notice from receiving this medicine? Side effects that you should report to your doctor or health care professional as soon as possible: -allergic reactions like skin  rash, itching or hives, swelling of the face, lips, or tongue -blue tint to skin -chest tightness, pain -difficulty breathing, wheezing -dizziness -red, swollen painful area on the leg Side effects that usually do not require medical attention (report to your doctor or health care professional if they continue or are bothersome): -diarrhea -headache This list may not describe all possible side effects. Call your doctor for medical advice about side effects. You may report side effects to FDA at 1-800-FDA-1088. Where should I keep my medicine? Keep out of the reach of children. Store at room temperature between 15 and 30 degrees C (59 and 85 degrees F). Protect from light. Throw away any unused medicine after the expiration date. NOTE: This sheet is a summary. It may not cover all possible information. If you have questions about this medicine, talk to your doctor, pharmacist, or health care provider.  2015, [employer] Standard. (2008-01-08 22:10:20)

## 2016-03-02 NOTE — Progress Notes (Signed)
[place]  Telephone:([phone]) [phone] Fax:(336) [phone]  Clinic follow up Note   Patient Care Team: [name], MD as PCP - General  03/02/2016  CHIEF COMPLAINTS Follow up breast cancer     Breast cancer of lower-outer quadrant of right female breast ([place])   09/03/2008 Initial Diagnosis right breast cancer, T2N1Mo, stage IIA, ER+, PR+, HER2+ (ration 2.07). Pt declined surgery or other treatment.     05/20/2011 Tumor Marker right breast mass biopsy showed IDA, ER+/PR+/HER2- (ratio 1.37)   09/04/2011 Cancer Staging PET scan showed no distant mets   10/08/2014 Progression  lumbar spine MRI showed multiple bone metastasis , highly suspicious for  metastatic disease.   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 Increased size of right pleural effusion, indeterminate for malignant involvement of the pleural fluids.   01/20/2016 Imaging CT CAP with contrast showed small bilateral pleural effusion, right greater than left, similar osseous metastasis, no evidence of extraosseous metastasis. Bone scan showed no areas of suspicious uptake.     HISTORY OF PRESENTING ILLNESS:  Margaret Rubio 75 y.o. female  Was referred by her primary care physician to discuss the management probable metastatic breast cancer.  She was initially diagnosed with right breast cancer in November 2009. 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 In December of 2009, she underwent a right axillary lymph node biopsy on 09/24/2008 which showed metastatic ductal  carcinoma. (Case No: [id number]) The tumor was ER 100% positive, PR 0% negative, Ki67 - 22%. 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 The ratio of HER-2:CEP 17 was 2.07.  She decided to pursue nature therapy by diet and exercise, and did not have surgery and other tranditional therapy for her breast cancer.   In May 2011 she underwent a mammogram that showed a 3.8 cm invasive duct carcinoma in the subareolar region of the right breast which had increased since November 2009.  The tumor was ER  99% positive,  PR 4% positive, HER-2 negative ( ratio 1.37).  She was seen by Dr. Estella Rubio and Dr. Lucia Rubio.  However, she failed multiple appointments and sought alternative medical care in [place]. This involved putting something on her right breast that caused some scarring.  She noticed the enlarged right breast mass and nipple inversion about one year ago. No pain, but some skin change. She developed left low back/buttock and hip and  pain in Nov 2015, was seen by PCP Dr. Jenny Rubio who prescribed vicodin and tramadol and pain improved with meds. Due to the persistent pain, she underwent a lumber MRI  On 10/08/2014, which showed multiple bone metastasis.  She run out pain medication about a week ago,  But did not call for refill. She states her pain is not bad , but she appears to be uncomfortable when she stands up and walks.  She otherwise feels well overall,  Has good appetite and energy level. She lives alone and still functions well.  She comes in with her niece today.   Current therapy:  1.Anastrozole 1 mg once daily started on 12/10/2014 2. Ibrance added  on 07/29/15, stopped in Nov 2016, restarted on 4/1  3. Xgeva started on 08/15/2015  INTERIM HISTORY: Margaret Rubio returns for follow-up. She is doing well overall. She is tolerating anastrozole and Ibrance well, no significant side effects. She is of Ibrance this week, and will start in a few days. She denies any episodes of fever, or infection since her  last visit. No significant pain or other complaints.    MEDICAL HISTORY:  Past Medical History  Diagnosis Date  . 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 ASTHMA 01/31/2008  . BREAST CANCER, HX OF 11/14/2009  . BRONCHITIS, ACUTE 01/31/2008  . Cough 10/17/2009  . FATIGUE 11/14/2009  . GANGLION CYST 05/01/2009  . HYPERLIPIDEMIA 05/21/2007  . HYPERTENSION 05/05/2007  . LOW BACK PAIN 05/21/2007  . MENOPAUSAL DISORDER 05/22/2010  . Pain in joint, lower leg 05/10/2008  . RASH-NONVESICULAR 05/01/2009  . SHOULDER PAIN, LEFT 11/14/2009  . 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 VITAMIN B12 DEFICIENCY 05/23/2010  . rt breast ca dx'd 2009    right breast    SURGICAL HISTORY: Past Surgical History  Procedure Laterality Date  . Removal of neck lump      benigh 1986  . Myomectomy      SOCIAL HISTORY: History   Social History  . Marital Status: Widowed    Spouse Name: N/A    Number of Children: 31, lives in [place].   . Years of Education: N/A   Occupational History  . Not on file.   Social History Main Topics  . Smoking status: Never Smoker   . Smokeless tobacco: Not on file  . Alcohol Use: No  . Drug Use: No  . Sexual Activity: Not on file   Other Topics Concern  . Not on file   Social History Narrative    FAMILY HISTORY: Family History  Problem Relation Age of Onset  . Asthma Other   . Diabetes Other   . Hypertension Other   . Hypertension Father   . Heart disease Father   Paternal aunt had cancer, unknown type. No other family history of malignancy   ALLERGIES:  is allergic to tomato.  MEDICATIONS:  Current Outpatient Prescriptions  Medication Sig Dispense Refill  . Ascorbic Acid (VITAMIN C) 100 MG CHEW Chew by mouth every morning.    Marland Kitchen aspirin EC 81 MG tablet Take 1 tablet (81 mg total) by mouth daily. 90 tablet 11  . ergocalciferol (VITAMIN D2) 50000 UNITS capsule Take 1 capsule (50,000 Units total) by mouth once a week. 4 capsule 3  . HYDROcodone-acetaminophen (NORCO/VICODIN) 5-325 MG  per tablet Take 1 tablet by mouth every 6 (six) hours as needed for moderate pain. 60 tablet 0  . morphine (MS CONTIN) 15 MG 12 hr tablet Take 1 tablet (15 mg total) by mouth every 12 (twelve) hours. 60 tablet 0  . 0    0  . tiZANidine (ZANAFLEX) 4 MG tablet Take 1 tablet (4 mg total) by mouth every 6 (six) hours as needed for muscle spasms. 40 tablet 1   No current facility-administered medications for this visit.    REVIEW OF SYSTEMS:   Constitutional: Denies fevers, chills or abnormal night sweats Eyes: Denies blurriness of vision, double vision or watery eyes Ears, nose, mouth, throat, and face: Denies mucositis or sore throat Respiratory: Denies cough, dyspnea or wheezes Cardiovascular: Denies palpitation, chest discomfort or lower extremity swelling Gastrointestinal:  Denies nausea, heartburn or change in bowel habits Skin: Denies abnormal skin rashes Lymphatics: Denies new lymphadenopathy or  easy bruising Neurological:Denies numbness, tingling or new weaknesses Behavioral/Psych: Mood is stable, no new changes  All other systems were reviewed with the patient and are negative.  PHYSICAL EXAMINATION: ECOG PERFORMANCE STATUS: 1 BP 148/66 mmHg  Pulse 63  Temp(Src) 98.2 F (36.8 C) (Oral)  Resp 17  Wt 201 lb 11.2 oz (91.491 kg)  SpO2 100%  GENERAL:alert, no distress and comfortable SKIN: skin color, texture, turgor are normal, no rashes or significant lesions EYES: normal, conjunctiva are pink and non-injected, sclera clear OROPHARYNX:no exudate, no erythema and lips, buccal mucosa, and tongue normal  NECK: supple, thyroid normal size, non-tender, without nodularity LYMPH:  no palpable lymphadenopathy in the cervical, axillary or inguinal LUNGS: clear to auscultation and percussion with normal breathing effort, slightly decreased breath sounds at the bottom of right lung. HEART: regular rate & rhythm and no murmurs and no lower extremity edema ABDOMEN:abdomen soft, non-tender  and normal bowel sounds Musculoskeletal:no cyanosis of digits and no clubbing  PSYCH: alert & oriented x 3 with fluent speech NEURO: no focal motor/sensory deficits Breasts: Breast inspection showed  Significantly enlarged right breast, compared to left breast, (+) skin pigmentations , nipple inversion and a dry ulcer/ scar below the nipple, no discharge.  The right breast is softer than before, nontender , no discrete mass. Palpation of the  Left breasts and axilla revealed no obvious mass that I could appreciate.   LABORATORY DATA:  I have reviewed the data as listed CBC Latest Ref Rng 03/02/2016 02/03/2016 01/15/2016  WBC 3.9 - 10.3 10e3/uL 2.4(L) 3.7(L) 2.5(L)  Hemoglobin 11.6 - 15.9 g/dL 9.1(L) 9.1(L) 10.1(L)  Hematocrit 34.8 - 46.6 % 27.9(L) 28.5(L) 30.1(L)  Platelets 145 - 400 10e3/uL 183 204 179    CMP Latest Ref Rng 03/02/2016 02/03/2016 01/15/2016  Glucose 70 - 140 mg/dl 87 85 94  BUN 7.0 - 26.0 mg/dL 10.9 10.5 12.9  Creatinine 0.6 - 1.1 mg/dL 0.7 0.7 0.9  Sodium 136 - 145 mEq/L 137 137 135(L)  Potassium 3.5 - 5.1 mEq/L 3.9 4.2 3.8  CO2 22 - 29 mEq/L '24 25 24  '$ Calcium 8.4 - 10.4 mg/dL 8.9 9.0 8.9  Total Protein 6.4 - 8.3 g/dL 7.0 6.6 7.4  Total Bilirubin 0.20 - 1.20 mg/dL 0.33 0.41 0.59  Alkaline Phos 40 - 150 U/L 59 44 55  AST 5 - 34 U/L 22 32 36(H)  ALT 0 - 55 U/L '9 15 14   '$ CA 27.29  Status: Finalresult Visible to patient:  Not Released Nextappt: 04/05/2016 at 01:00 PM in Oncology ([facility]) Dx:  Breast cancer of lower-outer quadrant...              Ref Range 12d ago (03/02/16) 85moago (02/03/16) 138mogo (01/15/16)    CA 27.29 0.0 - 38.6 U/mL 429.2 (H) 415.9 (H)CM 402.4 (H)CM          PATHOLOGY REPORT  RIGHT BREAST MASS, 6 O'CLOCK, NEEDLE CORE BIOPSIES: 09/03/2008 MICROSCOPIC FOCUS OF DUCTAL CARCINOMA IN SITU ASSOCIATED WITH MICROSCOPIC FOCUS OF MICROINVASION. SEE COMMENT.  LYMPH NODE, RIGHT AXILLARY, NEEDLE CORE BIOPSIES:  METASTATIC DUCTAL CARCINOMA.  09/24/2008  COMMENT There are needle core biopsies of lymph node showing partial replacement by metastatic adenocarcinoma consistent with metastasis from a breast primary, metastatic ductal carcinoma. Since the breast prognostic profile is not performed on the previous material because of limited invasive tumor ([id number] the breast prognostic profile will be performed on the tumor in the lymph node and reported in a separate report. Interpretation  HER-2/NEU BY CISH -- SHOWS AMPLIFICATION BY CISH ANALYSIS. THE RATIO OF HER-2: CEP 17 SIGNALS WAS 2.07  Diagnosis 05/20/2011 Breast, right, needle core biopsy - INVASIVE DUCTAL CARCINOMA, SEE COMMENT. - LYMPHOVASCULAR INVASION PRESENT. Estrogen Receptor (Negative, <1%): 99%, STRONG STAINING INTENSITY Progesterone Receptor (Negative, <1%): 4%, STRONG HER-2/NEU BY CISH - NO AMPLIFICATION OF HER-2 DETECTED. THE RATIO OF HER-2: CEP 17 SIGNALS WAS 1.38.  Breast, right, needle core biopsy, 6:30 o'clock 12/13/2014 - INVASIVE MAMMARY CARCINOMA. - SEE MICROSCOPIC DESCRIPTION. Microscopic Comment The features favor Grade II invasive ductal carcinoma. A breast prognostic profile will be performed. Dr. Donato Heinz agrees. The results were called to [facility] on 12/16/14. (JDP:ds 12/16/14) ADDENDUM: Immunohistochemistry for E-cadherin is performed and shows diffuse strong positivity consistent with invasive ductal carcinoma. Estrogen Receptor: 100%, POSITIVE, STRONG STAINING INTENSITY Progesterone Receptor: 2%, POSITIVE, STRONG STAINING INTENSITY Proliferation Marker Ki67: 27% HER-2/NEU BY CISH - NEGATIVE. RESULT RATIO OF HER2: CEP 17 SIGNALS 1.45 AVERAGE HER2 COPY NUMBER PER CELL 2.25  Diagnosis 08/01/2015 PLEURAL FLUID, RIGHT(SPECIMEN 1 OF 1 COLLECTED 08/01/15): METASTATIC CARCINOMA CONSISTENT WITH BREAST PRIMARY.   RADIOGRAPHIC STUDIES: I have personally reviewed the radiological  images as listed and agreed with the findings in the report.  PET 07/17/2015 IMPRESSION: 1. Suspected active diverticulitis along the sigmoid colon, with focal mesenteric stranding and a small locule of gas which is either within an inflamed diverticulum or a micro perforation. No overt free air. 2. Overall similar appearance of multifocal right breast cancer with extensive osseous metastatic disease. The right axillary lymph nodes are less notable that there is a borderline hypermetabolic left axillary lymph node currently. 3. Increasing size of the right pleural effusion, now moderate. Appearance indeterminate for malignant involvement of the pleural fluid. 4. Faintly increased activity in a right infrahilar lymph node which could represent early metastatic spread. 5. Other imaging findings of potential clinical significance: Chronic left maxillary sinusitis. Low-density blood pool suggests anemia. Left adrenal adenoma. These results will be called to the ordering clinician or representative by the Radiologist Assistant, and communication documented in the PACS or zVision Dashboard.  Brain MRI with and without contrast on 06/27/2015, at [facility] -In determinate, ill-defined soft tissue infiltration in the left orbit with questionable enhancement, further evaluation with CT orbits with contrast is recommended to the cyst in ruling out metastasis. -No intracranial metastasis -Diffusely hypoechoic intense bone marrow signal throughout the skull base   Bone scan 01/20/2016 FINDINGS: No areas of suspicious bony uptake to suggest osseous metastatic disease. Soft tissue activity is normal.  IMPRESSION: No evidence of bony metastatic disease.  CT chest, abdomen and pelvis with contrast 01/20/2016 IMPRESSION: CT CHEST IMPRESSION  1. Right greater the left pleural effusions, similar on the right and slightly increased on the left. 2. Similar  osseous metastasis. No evidence of extraosseous metastasis within the chest. 3. Atherosclerosis, including within the coronary arteries.  CT ABDOMEN AND PELVIS IMPRESSION  1. Similar osseous metastasis. 2. No extraosseous metastatic disease identified within the abdomen or pelvis.   ASSESSMENT & PLAN:   Margaret Rubio is a 75 year old  African-American female, who was diagnosed with node positive right breast  Invasive adenocarcinoma in  2009, untreated,  Now presents with low back pain and lumbar spine MRI is highly suspicious for bone metastasis.  1. Right breast IDA,  Strongly ER positive,  PR weakly positive,  HER-2 positive on first biopsy , but negative on second and third  biopsy 3 and  6 years later, with diffuse bone metastatic and right pleura, T4NxM1 stage IV  - it has been over 6 years since her initial diagnosis. Her lumbar MRI is very suspicious for bone metastasis.  Her disease course is more consistent with ER positive , HER-2 negative disease. - I discussed her initial PET scan results with her and her daughter. 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 -she knows that her disease at this stage is incurable. The goal of treatment is palliative and to prolong her life. -she is on first line anastrozole, Leslee Home was added on in October 2016 due to slight disease progression. She was not very compliant, off Ibrance for 3-4 months and restarted recently. -I discussed her restaging CT and bone scan from 01/20/2016, which showed diffuse  Sclerotic bone lesions, but are not active on the bone scan. No other new lesions. She has stable disease. -She is clinically doing well, tolerating Ibrance and anastrozole well, we'll continue. -Lab results reviewed, she has mild leukopenia and stable anemia, we'll continue monitoring  2. Bilateral pleural effusions, R>L -Cytology was positive for malignant cells , secondary to her breast cancer.  -We'll monitor her  pleural effusion closely    4. Low back/ hip pain,  Secondary to metastatic cancer -this has resolved since she started anastrozole -continue Xgeva monthly  5. Anemia -Likely secondary to her breast cancer, bone metastases and the B12 deficiency -Slightly improved after B12 injections. Continue B12 monthly for now -No need for transfusion. We'll follow up closely.  Plan: -Xgeva and B12 injection today, and every 4 weeks -she will start Ibrance on 5/25, and continue anastrozole -I'll see you back in 4 weeks with lab and injections.  All questions were answered. The patient knows to call the clinic with any problems, questions or concerns. I spent 20 minutes counseling the patient face to face. The total time spent in the appointment was 25 minutes and more than 50% was on counseling.     Truitt Merle, MD 03/02/2016 s

## 2016-03-03 LAB — CANCER ANTIGEN 27.29: CAN 27.29: 429.2 U/mL — AB (ref 0.0–38.6)

## 2016-03-29 ENCOUNTER — Ambulatory Visit (HOSPITAL_COMMUNITY)
Admission: RE | Admit: 2016-03-29 | Discharge: 2016-03-29 | Disposition: A | Discharge status: Home / Self Care | Payer: Medicare PPO | Source: Ambulatory Visit | Attending: Nurse Practitioner | Admitting: Nurse Practitioner

## 2016-03-29 ENCOUNTER — Other Ambulatory Visit: Payer: Self-pay | Admitting: *Deleted

## 2016-03-29 ENCOUNTER — Telehealth: Payer: Self-pay | Admitting: Nurse Practitioner

## 2016-03-29 ENCOUNTER — Encounter: Payer: Self-pay | Admitting: Nurse Practitioner

## 2016-03-29 ENCOUNTER — Ambulatory Visit (HOSPITAL_BASED_OUTPATIENT_CLINIC_OR_DEPARTMENT_OTHER): Payer: Medicare PPO | Admitting: Nurse Practitioner

## 2016-03-29 ENCOUNTER — Telehealth: Payer: Self-pay | Admitting: Hematology

## 2016-03-29 ENCOUNTER — Telehealth: Payer: Self-pay | Admitting: *Deleted

## 2016-03-29 VITALS — BP 139/68 | HR 72 | Temp 98.6°F | Resp 18 | Wt 200.7 lb

## 2016-03-29 DIAGNOSIS — C7951 Secondary malignant neoplasm of bone: Secondary | ICD-10-CM

## 2016-03-29 DIAGNOSIS — C50511 Malignant neoplasm of lower-outer quadrant of right female breast: Secondary | ICD-10-CM

## 2016-03-29 DIAGNOSIS — R21 Rash and other nonspecific skin eruption: Secondary | ICD-10-CM

## 2016-03-29 DIAGNOSIS — E785 Hyperlipidemia, unspecified: Secondary | ICD-10-CM | POA: Insufficient documentation

## 2016-03-29 DIAGNOSIS — I10 Essential (primary) hypertension: Secondary | ICD-10-CM | POA: Diagnosis not present

## 2016-03-29 DIAGNOSIS — R600 Localized edema: Secondary | ICD-10-CM | POA: Diagnosis not present

## 2016-03-29 DIAGNOSIS — R609 Edema, unspecified: Secondary | ICD-10-CM | POA: Diagnosis present

## 2016-03-29 DIAGNOSIS — Z170 Estrogen receptor positive status [ER+]: Secondary | ICD-10-CM

## 2016-03-29 NOTE — Progress Notes (Signed)
Preliminary results by tech - Venous Duplex Lower Ext. Bilateral Completed. Negative for deep and superficial vein thrombosis in both legs. A small Baker's Cyst was noted behind the left knee. Results given to [facility], BS, RDMS, RVT

## 2016-03-29 NOTE — Telephone Encounter (Signed)
spoke w/ pt confirmed added inj apt

## 2016-03-29 NOTE — Progress Notes (Signed)
SYMPTOM MANAGEMENT CLINIC    Chief Complaint: Rash to face and leg edema  HPI:  Margaret Rubio 75 y.o. female diagnosed with breast cancer with bone metastasis.  Currently undergoing anastrozole and Ibrance oral therapy; as well as Xgeva for bone therapy.  Patient states that she just returned home from a trip to [place], [place].  She has developed a rash to her face; as well as bilateral lower extremity edema.  She denies any other new symptoms whatsoever.  She denies any recent fevers or chills.     Breast cancer of lower-outer quadrant of right female breast ([place])   09/03/2008 Initial Diagnosis right breast cancer, T2N1Mo, stage IIA, ER+, PR+, HER2+ (ration 2.07). Pt declined surgery or other treatment.     05/20/2011 Tumor Marker right breast mass biopsy showed IDA, ER+/PR+/HER2- (ratio 1.37)   09/04/2011 Cancer Staging PET scan showed no distant mets   10/08/2014 Progression  lumbar spine MRI showed multiple bone metastasis , highly suspicious for  metastatic disease.   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 Increased size of right pleural effusion, indeterminate for malignant involvement of the pleural fluids.   01/20/2016 Imaging CT CAP with contrast showed small bilateral pleural effusion, right greater than left, similar osseous metastasis, no evidence of extraosseous metastasis. Bone scan showed no areas of suspicious uptake.    Review of Systems  Cardiovascular: Positive for leg swelling.  Skin: Positive for rash. Negative for itching.  All other systems reviewed and are negative.   Past Medical History  Diagnosis Date  .  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 ASTHMA 01/31/2008  . BREAST CANCER, HX OF 11/14/2009  . BRONCHITIS, ACUTE 01/31/2008  . Cough 10/17/2009  . FATIGUE 11/14/2009  . GANGLION CYST 05/01/2009  . HYPERLIPIDEMIA 05/21/2007  . HYPERTENSION 05/05/2007  . LOW BACK PAIN 05/21/2007  . MENOPAUSAL DISORDER 05/22/2010  . Pain in joint, lower leg 05/10/2008  . RASH-NONVESICULAR 05/01/2009  . SHOULDER PAIN, LEFT 11/14/2009  . 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 VITAMIN B12 DEFICIENCY 05/23/2010  . rt breast ca dx'd 2009    right breast    Past Surgical History  Procedure Laterality Date  . Removal of neck lump      benigh 1986  . Myomectomy      has Vitamin B12 deficiency; HYPERLIPIDEMIA; Iron deficiency anemia; Essential hypertension; ASTHMA; MENOPAUSAL DISORDER; SHOULDER PAIN, LEFT; Pain in joint, lower leg; LOW BACK PAIN; GANGLION CYST; FATIGUE; Rash; Cough; Breast cancer of lower-outer quadrant of right female breast ([facility]; Preventative health care; Right knee pain; Unspecified vitamin D deficiency; Primary localized osteoarthrosis, lower leg; Vitamin D deficiency; Left lumbar radiculopathy; Broken tooth; Encounter for chemotherapy management; Anemia in neoplastic disease; Pleural effusion, right; Breast cancer metastasized to bone ([facility]; Acute URI; and Peripheral edema on her problem list.    is allergic to tomato.    Medication List       This list is accurate as of: 03/29/16  1:57 PM.  Always use your most recent med list.               anastrozole 1 MG tablet  Commonly known as:  ARIMIDEX  Take 1 tablet (1 mg total)  by mouth daily.     ergocalciferol 50000 units capsule  Commonly known as:  VITAMIN D2  Take 1 capsule (50,000 Units total) by mouth once a week.     palbociclib 125 MG capsule  Commonly known as:  IBRANCE  Take 1 capsule (125 mg total) by mouth daily with breakfast. Take whole with food. Take for 21 days. Repeat every 28 days     traMADol 50 MG tablet  Commonly known as:   ULTRAM  Take 1 tablet (50 mg total) by mouth every 6 (six) hours as needed for moderate pain or severe pain.     Vitamin C 100 MG Chew  Chew 1 tablet (100 mg total) by mouth every morning.         PHYSICAL EXAMINATION  Oncology Vitals 03/29/2016 03/02/2016  Height - -  Weight 91.037 kg 91.491 kg  Weight (lbs) 200 lbs 11 oz 201 lbs 11 oz  BMI (kg/m2) - -  Temp 98.6 98.2  Pulse 72 63  Resp 18 17  SpO2 97 100  BSA (m2) - -   BP Readings from Last 2 Encounters:  03/29/16 139/68  03/02/16 148/66    Physical Exam  Constitutional: She is oriented to person, place, and time and well-developed, well-nourished, and in no distress.  HENT:  Head: Normocephalic and atraumatic.  Eyes: Conjunctivae and EOM are normal. Pupils are equal, round, and reactive to light. Right eye exhibits no discharge. Left eye exhibits no discharge. No scleral icterus.  Neck: Normal range of motion.  Pulmonary/Chest: Effort normal. No respiratory distress.  Musculoskeletal: Normal range of motion. She exhibits edema. She exhibits no tenderness.  Patient has +1 to +2 edema to her bilateral lower extremities; with the left slightly greater than the right.  There was no calf tenderness on exam.  Neurological: She is alert and oriented to person, place, and time. Gait normal.  Skin: Skin is warm and dry. Rash noted. No erythema. No pallor.  Patient has trace rash to the right side of her for head and to the left side of her neck only.  No evidence of infection.  Psychiatric: Affect normal.  Nursing note and vitals reviewed.   LABORATORY DATA:. No visits with results within 3 Day(s) from this visit. Latest known visit with results is:  Appointment on 03/02/2016  Component Date Value Ref Range Status  . CA 27.29 03/02/2016 429.2* 0.0 - 38.6 U/mL Final   Research scientist (life sciences)  . WBC 03/02/2016 2.4* 3.9 - 10.3 10e3/uL Final  . NEUT# 03/02/2016 1.1* 1.5 - 6.5 10e3/uL Final  . HGB 03/02/2016 9.1* 11.6 -  15.9 g/dL Final  . HCT 03/02/2016 27.9* 34.8 - 46.6 % Final  . Platelets 03/02/2016 183  145 - 400 10e3/uL Final  . MCV 03/02/2016 94.2  79.5 - 101.0 fL Final  . MCH 03/02/2016 30.9  25.1 - 34.0 pg Final  . MCHC 03/02/2016 32.8  31.5 - 36.0 g/dL Final  . RBC 03/02/2016 2.96* 3.70 - 5.45 10e6/uL Final  . RDW 03/02/2016 16.7* 11.2 - 14.5 % Final  . lymph# 03/02/2016 1.0  0.9 - 3.3 10e3/uL Final  . MONO# 03/02/2016 0.2  0.1 - 0.9 10e3/uL Final  . Eosinophils Absolute 03/02/2016 0.0  0.0 - 0.5 10e3/uL Final  . Basophils Absolute 03/02/2016 0.0  0.0 - 0.1 10e3/uL Final  . NEUT% 03/02/2016 45.5  38.4 - 76.8 % Final  . LYMPH% 03/02/2016 43.8  14.0 - 49.7 % Final  . MONO% 03/02/2016 9.3  0.0 - 14.0 % Final  . EOS% 03/02/2016 0.8  0.0 - 7.0 % Final  . BASO% 03/02/2016 0.6  0.0 - 2.0 % Final  . Sodium 03/02/2016 137  136 - 145 mEq/L Final  . Potassium 03/02/2016 3.9  3.5 - 5.1 mEq/L Final  . Chloride 03/02/2016 106  98 - 109 mEq/L Final  . CO2 03/02/2016 24  22 - 29 mEq/L Final  . Glucose 03/02/2016 87  70 - 140 mg/dl Final   Glucose reference range is for nonfasting patients. Fasting glucose reference range is 70- 100.  Marland Kitchen BUN 03/02/2016 10.9  7.0 - 26.0 mg/dL Final  . Creatinine 41/45/6547 0.7  0.6 - 1.1 mg/dL Final  . Total Bilirubin 03/02/2016 0.33  0.20 - 1.20 mg/dL Final  . Alkaline Phosphatase 03/02/2016 59  40 - 150 U/L Final  . AST 03/02/2016 22  5 - 34 U/L Final  . ALT 03/02/2016 9  0 - 55 U/L Final  . Total Protein 03/02/2016 7.0  6.4 - 8.3 g/dL Final  . Albumin 28/25/0311 3.7  3.5 - 5.0 g/dL Final  . Calcium 74/34/3620 8.9  8.4 - 10.4 mg/dL Final  . Anion Gap 58/59/5379 8  3 - 11 mEq/L Final  . EGFR 03/02/2016 >90  >90 ml/min/1.73 m2 Final   eGFR is calculated using the CKD-EPI Creatinine Equation (2009)    RADIOGRAPHIC STUDIES: No results found.  ASSESSMENT/PLAN:    Rash Patient states that she has developed a trace rash to her face.  Within the last few days.  She states  that the rash is non-pruritic.  Patient has mild, trace rash to her for head and to the left side of her neck.  The area does not appear to be insect bites.  Advised both patient and her family member that the mild rash could very well be secondary to the Ibrance oral therapy.  Patient was given instructions to try Benadryl 25 mg and Pepcid 20 mg over-the-counter as directed for treatment of mild allergic reaction.  Also, patient may try over-the-counter hydrocortisone cream to the sites as well.  Patient was advised to go directly to the emergency department if she develops any worsening symptoms of allergic reaction whatsoever.  Peripheral edema The patient states that she has been experiencing bilateral peripheral edema for the past 3 days.  She denies any chest pain, chest pressure, shortness of breath, or pain with exertion.  She states that she just returned from a weeklong trip to [place], [place].  She states that she took the bus to [place] and back home as well.  She states that the bus trip was between 6 to 7 hours each way.  Exam today reveals bilateral lower extremity edema at +1 to +2.  The left lower extremity is slightly larger than the right.  Patient also felt like she had a knot in her left inner thigh area as well; but this was difficult to palpate on exam.  All pulses were palpable and all extremities were warm.  Doppler ultrasound obtained today revealed no DVTs to bilateral legs.  However, there was a small Baker's cyst noted to the posterior left knee region.  Patient will be advised to elevate her legs above the level of for heart whenever possible.  She should also try compression stockings as well.  She should remain as active as possible.  Patient was advised to call/return or go directly to the emergency department if she develops any worsening symptoms of lower extremity edema,  or shortness of breath whatsoever.  Also, if patient continues with progressive bilateral  lower extremity edema- may need to draw labs and consider a BNP as well.  Breast cancer of lower-outer quadrant of right female breast [facility]) Patient continues to take anastrozole as directed; as well as Ibrance oral therapy.  She last received Xgeva for bone therapy on 03/02/2016.  She needs to be scheduled for labs, visit, and injections to receive both the Xgeva and the B-12 injections on 04/05/2016.   Patient stated understanding of all instructions; and was in agreement with this plan of care. The patient knows to call the clinic with any problems, questions or concerns.   Total time spent with patient was 25 minutes;  with greater than 75 percent of that time spent in face to face counseling regarding patient's symptoms,  and coordination of care and follow up.  Disclaimer:This dictation was prepared with Dragon/digital dictation along with [employer]. Any transcriptional errors that result from this process are unintentional.  Drue Second, NP 03/29/2016

## 2016-03-29 NOTE — Telephone Encounter (Signed)
Apt added with cyndee per pof

## 2016-03-29 NOTE — Assessment & Plan Note (Signed)
The patient states that she has been experiencing bilateral peripheral edema for the past 3 days.  She denies any chest pain, chest pressure, shortness of breath, or pain with exertion.  She states that she just returned from a weeklong trip to [place], [place].  She states that she took the bus to [place] and back home as well.  She states that the bus trip was between 6 to 7 hours each way.  Exam today reveals bilateral lower extremity edema at +1 to +2.  The left lower extremity is slightly larger than the right.  Patient also felt like she had a knot in her left inner thigh area as well; but this was difficult to palpate on exam.  All pulses were palpable and all extremities were warm.  Doppler ultrasound obtained today revealed no DVTs to bilateral legs.  However, there was a small Baker's cyst noted to the posterior left knee region.  Patient will be advised to elevate her legs above the level of for heart whenever possible.  She should also try compression stockings as well.  She should remain as active as possible.  Patient was advised to call/return or go directly to the emergency department if she develops any worsening symptoms of lower extremity edema, or shortness of breath whatsoever.  Also, if patient continues with progressive bilateral lower extremity edema- may need to draw labs and consider a BNP as well.

## 2016-03-29 NOTE — Telephone Encounter (Signed)
called pt back regarding msg,  transferred her to a triage nurse.

## 2016-03-29 NOTE — Telephone Encounter (Signed)
Called and reviewed.  Doppler ultrasound results with patient's niece per patient's instructions.  Patient's Doppler ultrasound of the bilateral lower extremities today was negative for DVT.  However, patient was found to have a small Baker's cyst behind her left knee.  Patient was advised to elevate her legs above the level of her whenever she is at rest, to remain as active as possible, and to also try over-the-counter compression stockings to see if this helps.  Patient was advised to call/return of electrical to the emergency department for any worsening symptoms whatsoever.

## 2016-03-29 NOTE — Assessment & Plan Note (Signed)
Patient continues to take anastrozole as directed; as well as Ibrance oral therapy.  She last received Xgeva for bone therapy on 03/02/2016.  She needs to be scheduled for labs, visit, and injections to receive both the Xgeva and the B-12 injections on 04/05/2016.

## 2016-03-29 NOTE — Telephone Encounter (Signed)
Patient called wanting to be seen due to recent symptoms she has been having. Patient has developed bilateral swelling that she noticed for about 3 days that will not subside with elevation. Patient also noticed a rash that she states is covering her face and itches. Patient is willing to see Symptom Management. POF placed.

## 2016-03-29 NOTE — Assessment & Plan Note (Signed)
Patient states that she has developed a trace rash to her face.  Within the last few days.  She states that the rash is non-pruritic.  Patient has mild, trace rash to her for head and to the left side of her neck.  The area does not appear to be insect bites.  Advised both patient and her family member that the mild rash could very well be secondary to the Ibrance oral therapy.  Patient was given instructions to try Benadryl 25 mg and Pepcid 20 mg over-the-counter as directed for treatment of mild allergic reaction.  Also, patient may try over-the-counter hydrocortisone cream to the sites as well.  Patient was advised to go directly to the emergency department if she develops any worsening symptoms of allergic reaction whatsoever.

## 2016-03-31 ENCOUNTER — Telehealth: Payer: Self-pay | Admitting: *Deleted

## 2016-03-31 NOTE — Telephone Encounter (Signed)
TC to pt to check status following [facility] visit. Unable to lm on vm. Not currently set up.

## 2016-04-05 ENCOUNTER — Other Ambulatory Visit (HOSPITAL_BASED_OUTPATIENT_CLINIC_OR_DEPARTMENT_OTHER): Payer: Medicare PPO

## 2016-04-05 ENCOUNTER — Ambulatory Visit (HOSPITAL_BASED_OUTPATIENT_CLINIC_OR_DEPARTMENT_OTHER): Payer: Medicare PPO

## 2016-04-05 ENCOUNTER — Telehealth: Payer: Self-pay | Admitting: Hematology

## 2016-04-05 ENCOUNTER — Encounter: Payer: Self-pay | Admitting: Hematology

## 2016-04-05 ENCOUNTER — Ambulatory Visit (HOSPITAL_BASED_OUTPATIENT_CLINIC_OR_DEPARTMENT_OTHER): Payer: Medicare PPO | Admitting: Hematology

## 2016-04-05 VITALS — BP 146/70 | HR 72 | Temp 98.3°F | Resp 18 | Ht 65.0 in | Wt 200.5 lb

## 2016-04-05 DIAGNOSIS — E538 Deficiency of other specified B group vitamins: Secondary | ICD-10-CM

## 2016-04-05 DIAGNOSIS — G893 Neoplasm related pain (acute) (chronic): Secondary | ICD-10-CM

## 2016-04-05 DIAGNOSIS — J910 Malignant pleural effusion: Secondary | ICD-10-CM

## 2016-04-05 DIAGNOSIS — C7951 Secondary malignant neoplasm of bone: Secondary | ICD-10-CM

## 2016-04-05 DIAGNOSIS — C50511 Malignant neoplasm of lower-outer quadrant of right female breast: Secondary | ICD-10-CM | POA: Diagnosis not present

## 2016-04-05 DIAGNOSIS — Z170 Estrogen receptor positive status [ER+]: Secondary | ICD-10-CM | POA: Diagnosis not present

## 2016-04-05 DIAGNOSIS — R600 Localized edema: Secondary | ICD-10-CM

## 2016-04-05 DIAGNOSIS — D649 Anemia, unspecified: Secondary | ICD-10-CM

## 2016-04-05 DIAGNOSIS — D630 Anemia in neoplastic disease: Secondary | ICD-10-CM

## 2016-04-05 DIAGNOSIS — I10 Essential (primary) hypertension: Secondary | ICD-10-CM

## 2016-04-05 LAB — CBC WITH DIFFERENTIAL/PLATELET
BASO%: 0.2 % (ref 0.0–2.0)
BASOS ABS: 0 10*3/uL (ref 0.0–0.1)
EOS ABS: 0.1 10*3/uL (ref 0.0–0.5)
EOS%: 1.4 % (ref 0.0–7.0)
HEMATOCRIT: 28.3 % — AB (ref 34.8–46.6)
HEMOGLOBIN: 9.2 g/dL — AB (ref 11.6–15.9)
LYMPH#: 1.3 10*3/uL (ref 0.9–3.3)
LYMPH%: 30.3 % (ref 14.0–49.7)
MCH: 29.9 pg (ref 25.1–34.0)
MCHC: 32.5 g/dL (ref 31.5–36.0)
MCV: 91.9 fL (ref 79.5–101.0)
MONO#: 0.4 10*3/uL (ref 0.1–0.9)
MONO%: 9.2 % (ref 0.0–14.0)
NEUT#: 2.5 10*3/uL (ref 1.5–6.5)
NEUT%: 58.9 % (ref 38.4–76.8)
Platelets: 209 10*3/uL (ref 145–400)
RBC: 3.08 10*6/uL — ABNORMAL LOW (ref 3.70–5.45)
RDW: 13.8 % (ref 11.2–14.5)
WBC: 4.2 10*3/uL (ref 3.9–10.3)

## 2016-04-05 LAB — COMPREHENSIVE METABOLIC PANEL
ALBUMIN: 3.5 g/dL (ref 3.5–5.0)
ALK PHOS: 61 U/L (ref 40–150)
ALT: 14 U/L (ref 0–55)
AST: 25 U/L (ref 5–34)
Anion Gap: 7 mEq/L (ref 3–11)
BILIRUBIN TOTAL: 0.31 mg/dL (ref 0.20–1.20)
BUN: 11.1 mg/dL (ref 7.0–26.0)
CALCIUM: 9 mg/dL (ref 8.4–10.4)
CO2: 25 mEq/L (ref 22–29)
Chloride: 107 mEq/L (ref 98–109)
Creatinine: 0.7 mg/dL (ref 0.6–1.1)
Glucose: 84 mg/dl (ref 70–140)
POTASSIUM: 3.6 meq/L (ref 3.5–5.1)
Sodium: 139 mEq/L (ref 136–145)
Total Protein: 6.6 g/dL (ref 6.4–8.3)

## 2016-04-05 MED ORDER — PALBOCICLIB 125 MG PO CAPS: 125.0000 mg | ORAL_CAPSULE | Freq: Every day | ORAL | Status: DC

## 2016-04-05 MED ORDER — DENOSUMAB 120 MG/1.7ML ~~LOC~~ SOLN
120.0000 mg | Freq: Once | SUBCUTANEOUS | Status: AC
  Administered 2016-04-05: 120 mg via SUBCUTANEOUS
  Filled 2016-04-05: qty 1.7

## 2016-04-05 MED ORDER — CYANOCOBALAMIN 1000 MCG/ML IJ SOLN
1000.0000 ug | Freq: Once | INTRAMUSCULAR | Status: AC
  Administered 2016-04-05: 1000 ug via INTRAMUSCULAR

## 2016-04-05 MED FILL — *IBRANCE 125 MG CAPSULE: 125 | 28 days supply | Qty: 21 | Fill #0

## 2016-04-05 NOTE — Patient Instructions (Signed)
Take Calcium with Vitamin D supplements two times daily.  Denosumab injection What is this medicine? DENOSUMAB (den oh sue mab) slows bone breakdown. Prolia is used to treat osteoporosis in women after menopause and in men. Delton See is used to prevent bone fractures and other bone problems caused by cancer bone metastases. Delton See is also used to treat giant cell tumor of the bone. This medicine may be used for other purposes; ask your health care provider or pharmacist if you have questions. What should I tell my health care provider before I take this medicine? They need to know if you have any of these conditions: -dental disease -eczema -infection or history of infections -kidney disease or on dialysis -low blood calcium or vitamin D -malabsorption syndrome -scheduled to have surgery or tooth extraction -taking medicine that contains denosumab -thyroid or parathyroid disease -an unusual reaction to denosumab, other medicines, foods, dyes, or preservatives -pregnant or trying to get pregnant -breast-feeding How should I use this medicine? This medicine is for injection under the skin. It is given by a health care professional in a hospital or clinic setting. If you are getting Prolia, a special MedGuide will be given to you by the pharmacist with each prescription and refill. Be sure to read this information carefully each time. For Prolia, talk to your pediatrician regarding the use of this medicine in children. Special care may be needed. For Delton See, talk to your pediatrician regarding the use of this medicine in children. While this drug may be prescribed for children as young as 13 years for selected conditions, precautions do apply. Overdosage: If you think you have taken too much of this medicine contact a poison control center or emergency room at once. NOTE: This medicine is only for you. Do not share this medicine with others. What if I miss a dose? It is important not to miss your  dose. Call your doctor or health care professional if you are unable to keep an appointment. What may interact with this medicine? Do not take this medicine with any of the following medications: -other medicines containing denosumab This medicine may also interact with the following medications: -medicines that suppress the immune system -medicines that treat cancer -steroid medicines like prednisone or cortisone This list may not describe all possible interactions. Give your health care provider a list of all the medicines, herbs, non-prescription drugs, or dietary supplements you use. Also tell them if you smoke, drink alcohol, or use illegal drugs. Some items may interact with your medicine. What should I watch for while using this medicine? Visit your doctor or health care professional for regular checks on your progress. Your doctor or health care professional may order blood tests and other tests to see how you are doing. Call your doctor or health care professional if you get a cold or other infection while receiving this medicine. Do not treat yourself. This medicine may decrease your body's ability to fight infection. You should make sure you get enough calcium and vitamin D while you are taking this medicine, unless your doctor tells you not to. Discuss the foods you eat and the vitamins you take with your health care professional. See your dentist regularly. Brush and floss your teeth as directed. Before you have any dental work done, tell your dentist you are receiving this medicine. Do not become pregnant while taking this medicine or for 5 months after stopping it. Women should inform their doctor if they wish to become pregnant or think they might  be pregnant. There is a potential for serious side effects to an unborn child. Talk to your health care professional or pharmacist for more information. What side effects may I notice from receiving this medicine? Side effects that you  should report to your doctor or health care professional as soon as possible: -allergic reactions like skin rash, itching or hives, swelling of the face, lips, or tongue -breathing problems -chest pain -fast, irregular heartbeat -feeling faint or lightheaded, falls -fever, chills, or any other sign of infection -muscle spasms, tightening, or twitches -numbness or tingling -skin blisters or bumps, or is dry, peels, or red -slow healing or unexplained pain in the mouth or jaw -unusual bleeding or bruising Side effects that usually do not require medical attention (Report these to your doctor or health care professional if they continue or are bothersome.): -muscle pain -stomach upset, gas This list may not describe all possible side effects. Call your doctor for medical advice about side effects. You may report side effects to FDA at 1-800-FDA-1088. Where should I keep my medicine? This medicine is only given in a clinic, doctor's office, or other health care setting and will not be stored at home. NOTE: This sheet is a summary. It may not cover all possible information. If you have questions about this medicine, talk to your doctor, pharmacist, or health care provider.    2016, [employer]. (2012-03-27 12:37:47) Cyanocobalamin, Vitamin B12 injection What is this medicine? CYANOCOBALAMIN (sye an oh koe BAL a min) is a man made form of vitamin B12. Vitamin B12 is used in the growth of healthy blood cells, nerve cells, and proteins in the body. It also helps with the metabolism of fats and carbohydrates. This medicine is used to treat people who can not absorb vitamin B12. This medicine may be used for other purposes; ask your health care provider or pharmacist if you have questions. COMMON BRAND NAME(S): Cyomin, LA-12, Nutri-Twelve, Primabalt What should I tell my health care provider before I take this medicine? They need to know if you have any of these conditions: -kidney  disease -Leber's disease -megaloblastic anemia -an unusual or allergic reaction to cyanocobalamin, cobalt, other medicines, foods, dyes, or preservatives -pregnant or trying to get pregnant -breast-feeding How should I use this medicine? This medicine is injected into a muscle or deeply under the skin. It is usually given by a health care professional in a clinic or doctor's office. However, your doctor may teach you how to inject yourself. Follow all instructions. Talk to your pediatrician regarding the use of this medicine in children. Special care may be needed. Overdosage: If you think you have taken too much of this medicine contact a poison control center or emergency room at once. NOTE: This medicine is only for you. Do not share this medicine with others. What if I miss a dose? If you are given your dose at a clinic or doctor's office, call to reschedule your appointment. If you give your own injections and you miss a dose, take it as soon as you can. If it is almost time for your next dose, take only that dose. Do not take double or extra doses. What may interact with this medicine? -colchicine -heavy alcohol intake This list may not describe all possible interactions. Give your health care provider a list of all the medicines, herbs, non-prescription drugs, or dietary supplements you use. Also tell them if you smoke, drink alcohol, or use illegal drugs. Some items may interact with your medicine.  What should I watch for while using this medicine? Visit your doctor or health care professional regularly. You may need blood work done while you are taking this medicine. You may need to follow a special diet. Talk to your doctor. Limit your alcohol intake and avoid smoking to get the best benefit. What side effects may I notice from receiving this medicine? Side effects that you should report to your doctor or health care professional as soon as possible: -allergic reactions like skin  rash, itching or hives, swelling of the face, lips, or tongue -blue tint to skin -chest tightness, pain -difficulty breathing, wheezing -dizziness -red, swollen painful area on the leg Side effects that usually do not require medical attention (report to your doctor or health care professional if they continue or are bothersome): -diarrhea -headache This list may not describe all possible side effects. Call your doctor for medical advice about side effects. You may report side effects to FDA at 1-800-FDA-1088. Where should I keep my medicine? Keep out of the reach of children. Store at room temperature between 15 and 30 degrees C (59 and 85 degrees F). Protect from light. Throw away any unused medicine after the expiration date. NOTE: This sheet is a summary. It may not cover all possible information. If you have questions about this medicine, talk to your doctor, pharmacist, or health care provider.  2015, [employer] Standard. (2008-01-08 22:10:20)

## 2016-04-05 NOTE — Telephone Encounter (Signed)
Gave and printed appt sched and avs for pt for July  °

## 2016-04-05 NOTE — Progress Notes (Signed)
[facility]  [phone] Fax:(336) [phone]  Clinic follow up Note   Patient Care Team: [name], MD as PCP - General  04/05/2016  CHIEF COMPLAINTS Follow up breast cancer     Breast cancer of lower-outer quadrant of right female breast ([place])   09/03/2008 Initial Diagnosis right breast cancer, T2N1Mo, stage IIA, ER+, PR+, HER2+ (ration 2.07). Pt declined surgery or other treatment.     05/20/2011 Tumor Marker right breast mass biopsy showed IDA, ER+/PR+/HER2- (ratio 1.37)   09/04/2011 Cancer Staging PET scan showed no distant mets   10/08/2014 Progression  lumbar spine MRI showed multiple bone metastasis , highly suspicious for  metastatic disease.   12/13/2014 Pathology Results Right breast needle biopsy showed invasive ductal carcinoma, ER 100% positive, PR 2% positive, HER-2 negative with ratio 1.45 and copy number 2.25   12/14/2014 -  Anti-estrogen oral therapy Arimidex 58m daily, Ibrance added on 07/29/2015   07/17/2015 Imaging PET scan showed overall similar appearance of multifocal right breast cancer with extensive bone metastasis. Increased size of right pleural effusion, indeterminate for malignant involvement of the pleural fluids.   01/20/2016 Imaging CT CAP with contrast showed small bilateral pleural effusion, right greater than left, similar osseous metastasis, no evidence of extraosseous metastasis. Bone scan showed no areas of suspicious uptake.     HISTORY OF PRESENTING ILLNESS:  Margaret AUTHIER75y.o. female  Was referred by her primary care physician to discuss the management probable metastatic breast cancer.  She was initially diagnosed with right breast cancer in November 2009. 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 In December of 2009, she underwent a right axillary lymph node biopsy on 09/24/2008 which showed metastatic ductal  carcinoma. (Case No: [id number] The tumor was ER 100% positive, PR 0% negative, Ki67 - 22%. 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 The ratio of HER-2:CEP 17 was 2.07.  She decided to pursue nature therapy by diet and exercise, and did not have surgery and other tranditional therapy for her breast cancer.   In May 2011 she underwent a mammogram that showed a 3.8 cm invasive duct carcinoma in the subareolar region of the right breast which had increased since November 2009.  The tumor was ER  99% positive,  PR 4% positive, HER-2 negative ( ratio 1.37).  She was seen by Dr. EEstella Huskand Dr. NLucia Gaskins  However, she failed multiple appointments and [place] This involved putting something on her right breast that caused some scarring.  She noticed the enlarged right breast mass and nipple inversion about one year ago. No pain, but some skin change. She developed left low back/buttock and hip and  pain in Nov 2015, was seen by PCP Dr. JJenny Reichmannwho prescribed vicodin and tramadol and pain improved with meds. Due to the persistent pain, she underwent a lumber MRI  On 10/08/2014, which showed multiple bone metastasis.  She run out pain medication about a week ago,  But did not call for refill. She states her pain is not bad , but she appears to be uncomfortable when she stands up and walks.  She otherwise feels well overall,  Has good appetite and energy level. She lives alone and still functions well.  She comes in with her niece today.   Current therapy:  1.Anastrozole 1 mg once daily started on 12/10/2014 2. Ibrance added  on 07/29/15, stopped in Nov 2016, restarted on 4/1  3. Xgeva started on 08/15/2015  INTERIM HISTORY: Ms. Gartland returns for follow-up. She is doing well overall. She was out of town for week last week, and she forgot to take her anastrozole and Ibrance. She developed some skin rash and no extremity edema last week, and was seen by our symptom  management clinic, Doppler was negative for DVT. Her skin rash has resolved, possibly related to her food. No other new complaints. She denies any significant pain, dyspnea, or other symptoms. She has moderate fatigue, able to tolerate a routine daily activities. She has gained about 10 pounds in the past several months.  MEDICAL HISTORY:  Past Medical History  Diagnosis Date  . 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 ASTHMA 01/31/2008  . BREAST CANCER, HX OF 11/14/2009  . BRONCHITIS, ACUTE 01/31/2008  . Cough 10/17/2009  . FATIGUE 11/14/2009  . GANGLION CYST 05/01/2009  . HYPERLIPIDEMIA 05/21/2007  . HYPERTENSION 05/05/2007  . LOW BACK PAIN 05/21/2007  . MENOPAUSAL DISORDER 05/22/2010  . Pain in joint, lower leg 05/10/2008  . RASH-NONVESICULAR 05/01/2009  . SHOULDER PAIN, LEFT 11/14/2009  . 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 VITAMIN B12 DEFICIENCY 05/23/2010  . rt breast ca dx'd 2009    right breast    SURGICAL HISTORY: Past Surgical History  Procedure Laterality Date  . Removal of neck lump      benigh 1986  . Myomectomy      SOCIAL HISTORY: History   Social History  . Marital Status: Widowed    Spouse Name: N/A    Number of Children: 8, lives in [place].   . Years of Education: N/A   Occupational History  . Not on file.   Social History Main Topics  . Smoking status: Never Smoker   . Smokeless tobacco: Not on file  . Alcohol Use: No  . Drug Use: No  . Sexual Activity: Not on file   Other Topics Concern  . Not on file   Social History Narrative    FAMILY HISTORY: Family History  Problem Relation Age of Onset  . Asthma Other   . Diabetes Other   . Hypertension Other   . Hypertension Father   . Heart disease Father   Paternal aunt had cancer, unknown type. No other family history of malignancy   ALLERGIES:  is allergic to tomato.  MEDICATIONS:  Current Outpatient Prescriptions on File Prior to Visit  Medication Sig Dispense Refill  . anastrozole (ARIMIDEX) 1 MG tablet Take  1 tablet (1 mg total) by mouth daily. 30 tablet 9  . Ascorbic Acid (VITAMIN C) 100 MG CHEW Chew 1 tablet (100 mg total) by mouth every morning. 90 each 3  . ergocalciferol (VITAMIN D2) 50000 UNITS capsule Take 1 capsule (50,000 Units total) by mouth once a week. 4 capsule 5  . traMADol (ULTRAM) 50 MG tablet Take 1 tablet (50 mg total) by mouth every 6 (six) hours as needed for moderate pain or severe pain. 30 tablet 0   No current facility-administered medications on file prior to visit.  ; REVIEW OF SYSTEMS:   Constitutional: Denies fevers, chills or abnormal night sweats Eyes: Denies blurriness of vision, double vision or watery eyes Ears, nose, mouth, throat, and face: Denies mucositis or sore throat Respiratory: Denies cough, dyspnea or wheezes Cardiovascular: Denies palpitation, chest discomfort or lower extremity swelling Gastrointestinal:  Denies nausea, heartburn or change in bowel habits Skin: Denies abnormal skin rashes Lymphatics: Denies new lymphadenopathy  or easy bruising Neurological:Denies numbness, tingling or new weaknesses Behavioral/Psych: Mood is stable, no new changes  All other systems were reviewed with the patient and are negative.  PHYSICAL EXAMINATION: ECOG PERFORMANCE STATUS: 1 BP 146/70 mmHg  Pulse 72  Temp(Src) 98.3 F (36.8 C) (Oral)  Resp 18  Ht 5' 5" (1.651 m)  Wt 200 lb 8 oz (90.946 kg)  BMI 33.36 kg/m2  SpO2 98%  GENERAL:alert, no distress and comfortable SKIN: skin color, texture, turgor are normal, no rashes or significant lesions EYES: normal, conjunctiva are pink and non-injected, sclera clear OROPHARYNX:no exudate, no erythema and lips, buccal mucosa, and tongue normal  NECK: supple, thyroid normal size, non-tender, without nodularity LYMPH:  no palpable lymphadenopathy in the cervical, axillary or inguinal LUNGS: clear to auscultation and percussion with normal breathing effort, slightly decreased breath sounds at the bottom of right  lung. HEART: regular rate & rhythm and no murmurs and no lower extremity edema ABDOMEN:abdomen soft, non-tender and normal bowel sounds Musculoskeletal:no cyanosis of digits and no clubbing  PSYCH: alert & oriented x 3 with fluent speech NEURO: no focal motor/sensory deficits Breasts: Breast inspection showed  Significantly enlarged right breast, compared to left breast, (+) skin pigmentations , nipple inversion and a dry ulcer/ scar below the nipple, no discharge.  The right breast is softer than before, nontender , no discrete mass. Palpation of the  Left breasts and axilla revealed no obvious mass that I could appreciate.   LABORATORY DATA:  I have reviewed the data as listed CBC Latest Ref Rng 04/05/2016 03/02/2016 02/03/2016  WBC 3.9 - 10.3 10e3/uL 4.2 2.4(L) 3.7(L)  Hemoglobin 11.6 - 15.9 g/dL 9.2(L) 9.1(L) 9.1(L)  Hematocrit 34.8 - 46.6 % 28.3(L) 27.9(L) 28.5(L)  Platelets 145 - 400 10e3/uL 209 183 204    CMP Latest Ref Rng 04/05/2016 03/02/2016 02/03/2016  Glucose 70 - 140 mg/dl 84 87 85  BUN 7.0 - 26.0 mg/dL 11.1 10.9 10.5  Creatinine 0.6 - 1.1 mg/dL 0.7 0.7 0.7  Sodium 136 - 145 mEq/L 139 137 137  Potassium 3.5 - 5.1 mEq/L 3.6 3.9 4.2  CO2 22 - 29 mEq/L _0 Calcium 8.4 - 10.4 mg/dL 9.0 8.9 9.0  Total Protein 6.4 - 8.3 g/dL 6.6 7.0 6.6  Total Bilirubin 0.20 - 1.20 mg/dL 0.31 0.33 0.41  Alkaline Phos 40 - 150 U/L 61 59 44  AST 5 - 34 U/L 25 22 32  ALT 0 - 55 U/L _1 CA 27.29  Status: Finalresult Visible to patient:  Not Released Nextappt: 04/05/2016 at 01:00 PM in Oncology ([facility]) Dx:  Breast cancer of lower-outer quadrant...              Ref Range 12d ago (03/02/16) 29moago (02/03/16) 175mogo (01/15/16)    CA 27.29 0.0 - 38.6 U/mL 429.2 (H) 415.9 (H)CM 402.4 (H)CM          PATHOLOGY REPORT  RIGHT BREAST MASS, 6 O'CLOCK, NEEDLE CORE BIOPSIES: 09/03/2008 MICROSCOPIC FOCUS OF DUCTAL CARCINOMA IN SITU ASSOCIATED  WITH MICROSCOPIC FOCUS OF MICROINVASION. SEE COMMENT.  LYMPH NODE, RIGHT AXILLARY, NEEDLE CORE BIOPSIES: METASTATIC DUCTAL CARCINOMA.  09/24/2008  COMMENT There are needle core biopsies of lymph node showing partial replacement by metastatic adenocarcinoma consistent with metastasis from a breast primary, metastatic ductal carcinoma. Since the breast prognostic profile is not performed on the previous material because of limited invasive tumor ([id number] the breast prognostic profile will be performed on the tumor  in the lymph node and reported in a separate report. Interpretation HER-2/NEU BY CISH -- SHOWS AMPLIFICATION BY CISH ANALYSIS. THE RATIO OF HER-2: CEP 17 SIGNALS WAS 2.07  Diagnosis 05/20/2011 Breast, right, needle core biopsy - INVASIVE DUCTAL CARCINOMA, SEE COMMENT. - LYMPHOVASCULAR INVASION PRESENT. Estrogen Receptor (Negative, <1%): 99%, STRONG STAINING INTENSITY Progesterone Receptor (Negative, <1%): 4%, STRONG HER-2/NEU BY CISH - NO AMPLIFICATION OF HER-2 DETECTED. THE RATIO OF HER-2: CEP 17 SIGNALS WAS 1.38.  Breast, right, needle core biopsy, 6:30 o'clock 12/13/2014 - INVASIVE MAMMARY CARCINOMA. - SEE MICROSCOPIC DESCRIPTION. Microscopic Comment The features favor Grade II invasive ductal carcinoma. A breast prognostic profile will be performed. Dr. Donato Heinz agrees. The results were called to [facility] on 12/16/14. (JDP:ds 12/16/14) ADDENDUM: Immunohistochemistry for E-cadherin is performed and shows diffuse strong positivity consistent with invasive ductal carcinoma. Estrogen Receptor: 100%, POSITIVE, STRONG STAINING INTENSITY Progesterone Receptor: 2%, POSITIVE, STRONG STAINING INTENSITY Proliferation Marker Ki67: 27% HER-2/NEU BY CISH - NEGATIVE. RESULT RATIO OF HER2: CEP 17 SIGNALS 1.45 AVERAGE HER2 COPY NUMBER PER CELL 2.25  Diagnosis 08/01/2015 PLEURAL FLUID, RIGHT(SPECIMEN 1 OF 1 COLLECTED 08/01/15): METASTATIC CARCINOMA  CONSISTENT WITH BREAST PRIMARY.   RADIOGRAPHIC STUDIES: I have personally reviewed the radiological images as listed and agreed with the findings in the report.  PET 07/17/2015 IMPRESSION: 1. Suspected active diverticulitis along the sigmoid colon, with focal mesenteric stranding and a small locule of gas which is either within an inflamed diverticulum or a micro perforation. No overt free air. 2. Overall similar appearance of multifocal right breast cancer with extensive osseous metastatic disease. The right axillary lymph nodes are less notable that there is a borderline hypermetabolic left axillary lymph node currently. 3. Increasing size of the right pleural effusion, now moderate. Appearance indeterminate for malignant involvement of the pleural fluid. 4. Faintly increased activity in a right infrahilar lymph node which could represent early metastatic spread. 5. Other imaging findings of potential clinical significance: Chronic left maxillary sinusitis. Low-density blood pool suggests anemia. Left adrenal adenoma. These results will be called to the ordering clinician or representative by the Radiologist Assistant, and communication documented in the PACS or zVision Dashboard.  Brain MRI with and without contrast on 06/27/2015, at [facility] -In determinate, ill-defined soft tissue infiltration in the left orbit with questionable enhancement, further evaluation with CT orbits with contrast is recommended to the cyst in ruling out metastasis. -No intracranial metastasis -Diffusely hypoechoic intense bone marrow signal throughout the skull base   Bone scan 01/20/2016 FINDINGS: No areas of suspicious bony uptake to suggest osseous metastatic disease. Soft tissue activity is normal.  IMPRESSION: No evidence of bony metastatic disease.  CT chest, abdomen and pelvis with contrast 01/20/2016 IMPRESSION: CT CHEST IMPRESSION  1. Right  greater the left pleural effusions, similar on the right and slightly increased on the left. 2. Similar osseous metastasis. No evidence of extraosseous metastasis within the chest. 3. Atherosclerosis, including within the coronary arteries.  CT ABDOMEN AND PELVIS IMPRESSION  1. Similar osseous metastasis. 2. No extraosseous metastatic disease identified within the abdomen or pelvis.   ASSESSMENT & PLAN:   Mrs. Hamed is a 75 year old  African-American female, who was diagnosed with node positive right breast  Invasive adenocarcinoma in  2009, untreated,  Now presents with low back pain and lumbar spine MRI is highly suspicious for bone metastasis.  1. Right breast IDA,  Strongly ER positive,  PR weakly positive,  HER-2 positive on first biopsy ,  but negative on second and third  biopsy 3 and 6 years later, with diffuse bone metastatic and right pleura, T4NxM1 stage IV  - it has been over 6 years since her initial diagnosis. Her lumbar MRI is very suspicious for bone metastasis.  Her disease course is more consistent with ER positive , HER-2 negative disease. - I discussed her initial PET scan results with her and her daughter. 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 -she knows that her disease at this stage is incurable. The goal of treatment is palliative and to prolong her life. -she is on first line anastrozole, Leslee Home was added on in October 2016 due to slight disease progression. She was not very compliant, off Ibrance for 3-4 months and restarted recently. -I discussed her restaging CT and bone scan from 01/20/2016, which showed diffuse  Sclerotic bone lesions, but are not active on the bone scan. No other new lesions. She has stable disease. -She is clinically doing well, tolerating Ibrance and anastrozole well, we'll continue. -Lab results reviewed, she has stable anemia, we'll continue monitoring  2. Leg edema, L>R -Doppler was negative for DVT on  03/29/16. She had remote left knee surgery. -Improved. She knows to elevate her legs when she sits.  3. Low back/ hip pain,  Secondary to metastatic cancer -this has resolved since she started anastrozole -continue Xgeva monthly  4. Anemia -Likely secondary to her breast cancer, bone metastases and the B12 deficiency -Slightly improved after B12 injections. Continue B12 monthly for now -No need for transfusion. We'll follow up closely.  Plan: -Xgeva and B12 injection today, and every 4 weeks -she will start Ibrance tomorrow 6/27, and continue anastrozole -I'll see you back in 4 weeks with lab and injections., will order restaging scan on next visit   All questions were answered. The patient knows to call the clinic with any problems, questions or concerns. I spent 20 minutes counseling the patient face to face. The total time spent in the appointment was 25 minutes and more than 50% was on counseling.     Truitt Merle, MD 04/05/2016

## 2016-04-06 LAB — CANCER ANTIGEN 27.29: CAN 27.29: 366 U/mL — AB (ref 0.0–38.6)

## 2016-04-08 MED FILL — ANASTROZOLE 1 MG TABLET: 1 | 30 days supply | Qty: 30 | Fill #0

## 2016-05-03 ENCOUNTER — Telehealth: Payer: Self-pay | Admitting: Nurse Practitioner

## 2016-05-03 ENCOUNTER — Other Ambulatory Visit: Payer: Self-pay

## 2016-05-03 ENCOUNTER — Ambulatory Visit: Payer: Medicare PPO | Admitting: Nurse Practitioner

## 2016-05-03 ENCOUNTER — Other Ambulatory Visit: Payer: Medicare PPO

## 2016-05-03 ENCOUNTER — Ambulatory Visit: Payer: Medicare PPO

## 2016-05-05 ENCOUNTER — Other Ambulatory Visit (HOSPITAL_BASED_OUTPATIENT_CLINIC_OR_DEPARTMENT_OTHER): Payer: Medicare PPO

## 2016-05-05 ENCOUNTER — Other Ambulatory Visit: Payer: Self-pay

## 2016-05-05 ENCOUNTER — Ambulatory Visit (HOSPITAL_BASED_OUTPATIENT_CLINIC_OR_DEPARTMENT_OTHER): Payer: Medicare PPO

## 2016-05-05 ENCOUNTER — Telehealth: Payer: Self-pay | Admitting: Hematology

## 2016-05-05 ENCOUNTER — Ambulatory Visit (HOSPITAL_BASED_OUTPATIENT_CLINIC_OR_DEPARTMENT_OTHER): Payer: Medicare PPO | Admitting: Nurse Practitioner

## 2016-05-05 VITALS — BP 144/63 | HR 73 | Temp 98.2°F | Resp 18 | Ht 65.0 in | Wt 199.2 lb

## 2016-05-05 DIAGNOSIS — J90 Pleural effusion, not elsewhere classified: Secondary | ICD-10-CM

## 2016-05-05 DIAGNOSIS — Z79811 Long term (current) use of aromatase inhibitors: Secondary | ICD-10-CM

## 2016-05-05 DIAGNOSIS — C7951 Secondary malignant neoplasm of bone: Secondary | ICD-10-CM

## 2016-05-05 DIAGNOSIS — D649 Anemia, unspecified: Secondary | ICD-10-CM

## 2016-05-05 DIAGNOSIS — C50511 Malignant neoplasm of lower-outer quadrant of right female breast: Secondary | ICD-10-CM

## 2016-05-05 DIAGNOSIS — E538 Deficiency of other specified B group vitamins: Secondary | ICD-10-CM

## 2016-05-05 DIAGNOSIS — Z170 Estrogen receptor positive status [ER+]: Secondary | ICD-10-CM

## 2016-05-05 DIAGNOSIS — C50919 Malignant neoplasm of unspecified site of unspecified female breast: Secondary | ICD-10-CM

## 2016-05-05 LAB — COMPREHENSIVE METABOLIC PANEL
ALT: 11 U/L (ref 0–55)
ANION GAP: 8 meq/L (ref 3–11)
AST: 22 U/L (ref 5–34)
Albumin: 3.6 g/dL (ref 3.5–5.0)
Alkaline Phosphatase: 58 U/L (ref 40–150)
BILIRUBIN TOTAL: 0.43 mg/dL (ref 0.20–1.20)
BUN: 10 mg/dL (ref 7.0–26.0)
CALCIUM: 8.7 mg/dL (ref 8.4–10.4)
CO2: 25 mEq/L (ref 22–29)
CREATININE: 0.8 mg/dL (ref 0.6–1.1)
Chloride: 107 mEq/L (ref 98–109)
EGFR: 79 mL/min/{1.73_m2} — ABNORMAL LOW (ref >90)
Glucose: 119 mg/dl (ref 70–140)
Potassium: 3.7 mEq/L (ref 3.5–5.1)
Sodium: 140 mEq/L (ref 136–145)
TOTAL PROTEIN: 7 g/dL (ref 6.4–8.3)

## 2016-05-05 LAB — CBC WITH DIFFERENTIAL/PLATELET
BASO%: 0.3 % (ref 0.0–2.0)
Basophils Absolute: 0 10*3/uL (ref 0.0–0.1)
EOS ABS: 0 10*3/uL (ref 0.0–0.5)
EOS%: 1 % (ref 0.0–7.0)
HEMATOCRIT: 31.6 % — AB (ref 34.8–46.6)
HGB: 10.4 g/dL — ABNORMAL LOW (ref 11.6–15.9)
LYMPH#: 1.2 10*3/uL (ref 0.9–3.3)
LYMPH%: 30.9 % (ref 14.0–49.7)
MCH: 29.5 pg (ref 25.1–34.0)
MCHC: 32.9 g/dL (ref 31.5–36.0)
MCV: 89.8 fL (ref 79.5–101.0)
MONO#: 0.2 10*3/uL (ref 0.1–0.9)
MONO%: 5.5 % (ref 0.0–14.0)
NEUT%: 62.3 % (ref 38.4–76.8)
NEUTROS ABS: 2.4 10*3/uL (ref 1.5–6.5)
PLATELETS: 217 10*3/uL (ref 145–400)
RBC: 3.52 10*6/uL — ABNORMAL LOW (ref 3.70–5.45)
RDW: 13.6 % (ref 11.2–14.5)
WBC: 3.9 10*3/uL (ref 3.9–10.3)

## 2016-05-05 MED ORDER — PALBOCICLIB 125 MG PO CAPS: 125.0000 mg | ORAL_CAPSULE | Freq: Every day | ORAL | 0 refills | Status: DC

## 2016-05-05 MED ORDER — DENOSUMAB 120 MG/1.7ML ~~LOC~~ SOLN
120.0000 mg | Freq: Once | SUBCUTANEOUS | Status: AC
  Administered 2016-05-05: 120 mg via SUBCUTANEOUS
  Filled 2016-05-05: qty 1.7

## 2016-05-05 MED ORDER — CYANOCOBALAMIN 1000 MCG/ML IJ SOLN
1000.0000 ug | Freq: Once | INTRAMUSCULAR | Status: AC
  Administered 2016-05-05: 1000 ug via INTRAMUSCULAR

## 2016-05-05 MED ORDER — PALBOCICLIB 125 MG PO CAPS
125.0000 mg | ORAL_CAPSULE | Freq: Every day | ORAL | 0 refills | Status: DC
Start: 2016-05-06 — End: 2016-06-02

## 2016-05-05 MED FILL — ANASTROZOLE 1 MG TABLET: 1 | 30 days supply | Qty: 30 | Fill #1

## 2016-05-05 MED FILL — *IBRANCE 125 MG CAPSULE: 125 | 28 days supply | Qty: 21 | Fill #0

## 2016-05-05 NOTE — Telephone Encounter (Signed)
Gave pt cal & avs °

## 2016-05-05 NOTE — Progress Notes (Signed)
[facility] OFFICE PROGRESS NOTE   Diagnosis:  Breast cancer Breast cancer of lower-outer quadrant of right female breast ([place])   09/03/2008 Initial Diagnosis right breast cancer, T2N1Mo, stage IIA, ER+, PR+, HER2+ (ration 2.07). Pt declined surgery or other treatment.     05/20/2011 Tumor Marker right breast mass biopsy showed IDA, ER+/PR+/HER2- (ratio 1.37)   09/04/2011 Cancer Staging PET scan showed no distant mets   10/08/2014 Progression  lumbar spine MRI showed multiple bone metastasis , highly suspicious for  metastatic disease.   12/13/2014 Pathology Results Right breast needle biopsy showed invasive ductal carcinoma, ER 100% positive, PR 2% positive, HER-2 negative with ratio 1.45 and copy number 2.25   12/14/2014 -  Anti-estrogen oral therapy Arimidex 53m daily, Ibrance added on 07/29/2015   07/17/2015 Imaging PET scan showed overall similar appearance of multifocal right breast cancer with extensive bone metastasis. Increased size of right pleural effusion, indeterminate for malignant involvement of the pleural fluids.   01/20/2016 Imaging CT CAP with contrast showed small bilateral pleural effusion, right greater than left, similar osseous metastasis, no evidence of extraosseous metastasis. Bone scan showed no areas of suspicious uptake.    INTERVAL HISTORY:   Ms. ACradyreturns for follow-up.She completed the most recent cycle of Ibrance beginning 04/06/2016. She continues anastrozole. She overall feels well. He denies pain. She has a good appetite. No nausea or vomiting. No mouth sores. No diarrhea. No rash. No urinary symptoms. She denies shortness of breath. No cough. No fever. She intermittently notes leg edema left greater than right. No associated pain.  Objective:  Vital signs in last 24 hours:  Blood pressure (!) 144/63, pulse 73, temperature 98.2 F (36.8 C), temperature source Oral, resp. rate 18, height _0  (1.651 m), weight 199 lb 3.2 oz (90.4  kg), SpO2 100 %.    HEENT: No thrush or ulcers. Lymphatics: No palpable cervical, supra clavicular lymph nodes. Resp: Lungs clear bilaterally. Cardio: Regular rate and rhythm. GI: Abdomen soft and nontender. No hepatomegaly. Vascular: Trace lower leg edema bilaterally left slightly greater than right. Neuro: Alert and oriented.  Skin: No rash.    Lab Results:  Lab Results  Component Value Date   WBC 3.9 05/05/2016   HGB 10.4 (L) 05/05/2016   HCT 31.6 (L) 05/05/2016   MCV 89.8 05/05/2016   PLT 217 05/05/2016   NEUTROABS 2.4 05/05/2016    Imaging:  No results found.  Medications: I have reviewed the patient's current medications.  Assessment/Plan: 1. Right breast IDA,  Strongly ER positive,  PR weakly positive,  HER-2 positive on first biopsy, but negative on second and third biopsy 3 and 6 years later, with diffuse bone metastates and right pleura, T4NxM1 stage IV; on active treatment with Ibrance and anastrozole.  2. Lower extremity edema left greater than right. Negative Doppler 03/29/2016. History of remote left knee surgery. 3. Low back/hip pain secondary to metastatic cancer. Resolved since starting anastrozole. RVirgel Palingmonthly. 4. Anemia likely secondary to breast cancer, bone metastases and B-12 deficiency. Slightly improved after B-12 injections. Currently receiving B-12 monthly.    Disposition: [name] stable. She will begin the next cycle of Ibrance 05/06/2016. She will continue anastrozole. Xgeva and B-12 injections today.  She will have restaging CT scans and a bone scan in approximately 3 weeks.  She will return for a follow-up visit and labs in 4 weeks. She will contact the office in the interim with any problems.    TNed CardANP/GNP-BC  05/05/2016  2:12 PM

## 2016-05-05 NOTE — Patient Instructions (Signed)
Take Calcium with Vitamin D supplements two times daily.  Denosumab injection What is this medicine? DENOSUMAB (den oh sue mab) slows bone breakdown. Prolia is used to treat osteoporosis in women after menopause and in men. Delton See is used to prevent bone fractures and other bone problems caused by cancer bone metastases. Delton See is also used to treat giant cell tumor of the bone. This medicine may be used for other purposes; ask your health care provider or pharmacist if you have questions. What should I tell my health care provider before I take this medicine? They need to know if you have any of these conditions: -dental disease -eczema -infection or history of infections -kidney disease or on dialysis -low blood calcium or vitamin D -malabsorption syndrome -scheduled to have surgery or tooth extraction -taking medicine that contains denosumab -thyroid or parathyroid disease -an unusual reaction to denosumab, other medicines, foods, dyes, or preservatives -pregnant or trying to get pregnant -breast-feeding How should I use this medicine? This medicine is for injection under the skin. It is given by a health care professional in a hospital or clinic setting. If you are getting Prolia, a special MedGuide will be given to you by the pharmacist with each prescription and refill. Be sure to read this information carefully each time. For Prolia, talk to your pediatrician regarding the use of this medicine in children. Special care may be needed. For Delton See, talk to your pediatrician regarding the use of this medicine in children. While this drug may be prescribed for children as young as 13 years for selected conditions, precautions do apply. Overdosage: If you think you have taken too much of this medicine contact a poison control center or emergency room at once. NOTE: This medicine is only for you. Do not share this medicine with others. What if I miss a dose? It is important not to miss your  dose. Call your doctor or health care professional if you are unable to keep an appointment. What may interact with this medicine? Do not take this medicine with any of the following medications: -other medicines containing denosumab This medicine may also interact with the following medications: -medicines that suppress the immune system -medicines that treat cancer -steroid medicines like prednisone or cortisone This list may not describe all possible interactions. Give your health care provider a list of all the medicines, herbs, non-prescription drugs, or dietary supplements you use. Also tell them if you smoke, drink alcohol, or use illegal drugs. Some items may interact with your medicine. What should I watch for while using this medicine? Visit your doctor or health care professional for regular checks on your progress. Your doctor or health care professional may order blood tests and other tests to see how you are doing. Call your doctor or health care professional if you get a cold or other infection while receiving this medicine. Do not treat yourself. This medicine may decrease your body's ability to fight infection. You should make sure you get enough calcium and vitamin D while you are taking this medicine, unless your doctor tells you not to. Discuss the foods you eat and the vitamins you take with your health care professional. See your dentist regularly. Brush and floss your teeth as directed. Before you have any dental work done, tell your dentist you are receiving this medicine. Do not become pregnant while taking this medicine or for 5 months after stopping it. Women should inform their doctor if they wish to become pregnant or think they might  be pregnant. There is a potential for serious side effects to an unborn child. Talk to your health care professional or pharmacist for more information. What side effects may I notice from receiving this medicine? Side effects that you  should report to your doctor or health care professional as soon as possible: -allergic reactions like skin rash, itching or hives, swelling of the face, lips, or tongue -breathing problems -chest pain -fast, irregular heartbeat -feeling faint or lightheaded, falls -fever, chills, or any other sign of infection -muscle spasms, tightening, or twitches -numbness or tingling -skin blisters or bumps, or is dry, peels, or red -slow healing or unexplained pain in the mouth or jaw -unusual bleeding or bruising Side effects that usually do not require medical attention (Report these to your doctor or health care professional if they continue or are bothersome.): -muscle pain -stomach upset, gas This list may not describe all possible side effects. Call your doctor for medical advice about side effects. You may report side effects to FDA at 1-800-FDA-1088. Where should I keep my medicine? This medicine is only given in a clinic, doctor's office, or other health care setting and will not be stored at home. NOTE: This sheet is a summary. It may not cover all possible information. If you have questions about this medicine, talk to your doctor, pharmacist, or health care provider.    2016, [employer]. (2012-03-27 12:37:47) Cyanocobalamin, Vitamin B12 injection What is this medicine? CYANOCOBALAMIN (sye an oh koe BAL a min) is a man made form of vitamin B12. Vitamin B12 is used in the growth of healthy blood cells, nerve cells, and proteins in the body. It also helps with the metabolism of fats and carbohydrates. This medicine is used to treat people who can not absorb vitamin B12. This medicine may be used for other purposes; ask your health care provider or pharmacist if you have questions. COMMON BRAND NAME(S): Cyomin, LA-12, Nutri-Twelve, Primabalt What should I tell my health care provider before I take this medicine? They need to know if you have any of these conditions: -kidney  disease -Leber's disease -megaloblastic anemia -an unusual or allergic reaction to cyanocobalamin, cobalt, other medicines, foods, dyes, or preservatives -pregnant or trying to get pregnant -breast-feeding How should I use this medicine? This medicine is injected into a muscle or deeply under the skin. It is usually given by a health care professional in a clinic or doctor's office. However, your doctor may teach you how to inject yourself. Follow all instructions. Talk to your pediatrician regarding the use of this medicine in children. Special care may be needed. Overdosage: If you think you have taken too much of this medicine contact a poison control center or emergency room at once. NOTE: This medicine is only for you. Do not share this medicine with others. What if I miss a dose? If you are given your dose at a clinic or doctor's office, call to reschedule your appointment. If you give your own injections and you miss a dose, take it as soon as you can. If it is almost time for your next dose, take only that dose. Do not take double or extra doses. What may interact with this medicine? -colchicine -heavy alcohol intake This list may not describe all possible interactions. Give your health care provider a list of all the medicines, herbs, non-prescription drugs, or dietary supplements you use. Also tell them if you smoke, drink alcohol, or use illegal drugs. Some items may interact with your medicine.  What should I watch for while using this medicine? Visit your doctor or health care professional regularly. You may need blood work done while you are taking this medicine. You may need to follow a special diet. Talk to your doctor. Limit your alcohol intake and avoid smoking to get the best benefit. What side effects may I notice from receiving this medicine? Side effects that you should report to your doctor or health care professional as soon as possible: -allergic reactions like skin  rash, itching or hives, swelling of the face, lips, or tongue -blue tint to skin -chest tightness, pain -difficulty breathing, wheezing -dizziness -red, swollen painful area on the leg Side effects that usually do not require medical attention (report to your doctor or health care professional if they continue or are bothersome): -diarrhea -headache This list may not describe all possible side effects. Call your doctor for medical advice about side effects. You may report side effects to FDA at 1-800-FDA-1088. Where should I keep my medicine? Keep out of the reach of children. Store at room temperature between 15 and 30 degrees C (59 and 85 degrees F). Protect from light. Throw away any unused medicine after the expiration date. NOTE: This sheet is a summary. It may not cover all possible information. If you have questions about this medicine, talk to your doctor, pharmacist, or health care provider.  2015, [employer] Standard. (2008-01-08 22:10:20)

## 2016-05-06 LAB — CANCER ANTIGEN 27.29: CA 27.29: 416.1 U/mL — ABNORMAL HIGH (ref 0.0–38.6)

## 2016-05-12 ENCOUNTER — Telehealth: Payer: Self-pay | Admitting: Internal Medicine

## 2016-05-12 DIAGNOSIS — H029 Unspecified disorder of eyelid: Secondary | ICD-10-CM

## 2016-05-12 NOTE — Telephone Encounter (Signed)
Patient has been seeing an oncologist for drooping under her eye.  The oncologist states she needs to see an ENT for this.  Is this something she can be referred for without being seen?

## 2016-05-12 NOTE — Telephone Encounter (Signed)
Referral has been done

## 2016-05-13 NOTE — Telephone Encounter (Signed)
Notified pt referral for ENT has been place will receive call once appt has been set-up...Margaret Rubio

## 2016-05-19 ENCOUNTER — Ambulatory Visit (INDEPENDENT_AMBULATORY_CARE_PROVIDER_SITE_OTHER): Payer: Medicare PPO | Admitting: Internal Medicine

## 2016-05-19 ENCOUNTER — Telehealth: Payer: Self-pay | Admitting: Internal Medicine

## 2016-05-19 DIAGNOSIS — H029 Unspecified disorder of eyelid: Secondary | ICD-10-CM

## 2016-05-19 DIAGNOSIS — R51 Headache: Secondary | ICD-10-CM

## 2016-05-19 DIAGNOSIS — C799 Secondary malignant neoplasm of unspecified site: Secondary | ICD-10-CM | POA: Diagnosis not present

## 2016-05-19 DIAGNOSIS — H5712 Ocular pain, left eye: Secondary | ICD-10-CM | POA: Insufficient documentation

## 2016-05-19 DIAGNOSIS — R519 Headache, unspecified: Secondary | ICD-10-CM | POA: Insufficient documentation

## 2016-05-19 DIAGNOSIS — C50919 Malignant neoplasm of unspecified site of unspecified female breast: Secondary | ICD-10-CM

## 2016-05-19 NOTE — Telephone Encounter (Signed)
-----   Message from Octavio Manns sent at 05/19/2016 11:00 AM EDT ----- Referral for pt was put in to see an ENT for eyelid abnormality.  Does this need to be for an eye dr.? Please advise

## 2016-05-19 NOTE — Progress Notes (Signed)
Subjective:    Patient ID: Margaret Rubio, female    DOB: [birth year], 75 y.o.   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  Also followed per local oncology.  Pt today with persistent ? Worsening left eye blurred vision without pain, but also with a swelling vaguely above and lateral to the eye and more prominent to the maxillary sinus area. No fever, trauma, sinus pain though has some congestion at times. Pt still does not want to consider aggressive tx, but is interested in optho and ENT evaluations to see if any other tx short of more aggressive. Pt denies chest pain, increased sob or doe, wheezing, orthopnea, PND, increased LE swelling, palpitations, dizziness or syncope.  Past Medical History:  Diagnosis Date  . 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 ASTHMA 01/31/2008  . BREAST CANCER, HX OF 11/14/2009  . BRONCHITIS, ACUTE 01/31/2008  . Cough 10/17/2009  . FATIGUE 11/14/2009  . GANGLION CYST 05/01/2009  . HYPERLIPIDEMIA 05/21/2007  . HYPERTENSION 05/05/2007  . LOW BACK PAIN 05/21/2007  . MENOPAUSAL DISORDER 05/22/2010  . Pain in joint, lower leg 05/10/2008  . RASH-NONVESICULAR 05/01/2009  . rt breast ca dx'd 2009   right breast  . SHOULDER PAIN, LEFT 11/14/2009  . 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 VITAMIN B12 DEFICIENCY 05/23/2010   Past Surgical History:  Procedure Laterality Date  . MYOMECTOMY    . removal of neck lump     benigh 1986    reports that she has never smoked. She does not have any smokeless tobacco history on file. She reports that she does not drink alcohol or use drugs. family history includes Asthma in her other; Diabetes in her other; Heart disease in her father; Hypertension in her father and other. Allergies  Allergen Reactions  .  Tomato Hives   Current Outpatient Prescriptions on File Prior to Visit  Medication Sig Dispense Refill  . anastrozole (ARIMIDEX) 1 MG tablet Take 1 tablet (1 mg total) by mouth daily. 30 tablet 9  . Ascorbic Acid (VITAMIN C) 100 MG CHEW Chew 1 tablet (100 mg total) by mouth every morning. 90 each 3  . ergocalciferol (VITAMIN D2) 50000 UNITS capsule Take 1 capsule (50,000 Units total) by mouth once a week. 4 capsule 5  . palbociclib (IBRANCE) 125 MG capsule Take 1 capsule (125 mg total) by mouth daily with breakfast. Take whole with food. Take for 21 days. Repeat every 28 days 21 capsule 0   No current facility-administered medications on file prior to visit.    Review of Systems  Constitutional: Negative for unusual diaphoresis or night sweats HENT: Negative for ear swelling or discharge Eyes: Negative for worsening visual haziness  Respiratory: Negative for choking and stridor.   Gastrointestinal: Negative for distension or worsening eructation Genitourinary: Negative for retention or change in urine volume.  Musculoskeletal: Negative for other MSK pain or swelling Skin: Negative for color change and worsening wound Neurological: Negative for tremors and numbness other than noted  Psychiatric/Behavioral: Negative for decreased concentration or agitation other than above       Objective:   Physical Exam BP 130/70   Pulse 74   Temp 97.9 F (36.6 C) (Oral)   Resp 16  Wt 200 lb (90.7 kg)   SpO2 95%   BMI 33.28 kg/m  VS noted,  Constitutional: Pt appears in no apparent distress HENT: Head: NCAT.  Right Ear: External ear normal.  Left Ear: External ear normal.  Eyes: . Conjunctivae and EOM are normal  Left eye with some exopthalmos and slight left lateral deviation but o/w cn 2-12 intact, vague soft tissue swelling noted periorbital worse to the maxillary sinus area without tenderness or skin change Neck: Normal range of motion. Neck supple.  Cardiovascular: Normal rate and  regular rhythm.   Pulmonary/Chest: Effort normal and breath sounds without rales or wheezing.  Neurological: Pt is alert. Not confused , motor grossly intact Skin: Skin is warm. No rash, no LE edema Psychiatric: Pt behavior is normal. No agitation.     Assessment & Plan:

## 2016-05-19 NOTE — Patient Instructions (Signed)
You will be contacted regarding the referral for: ENT  Please continue all other medications as before, and refills have been done if requested.  Please have the pharmacy call with any other refills you may need.  Please keep your appointments with your specialists as you may have planned - opthomology on Friday Aug 11

## 2016-05-19 NOTE — Telephone Encounter (Signed)
Ok to cancel ent, ok refer optho - done

## 2016-05-19 NOTE — Progress Notes (Signed)
Pre visit review using our clinic review tool, if applicable. No additional management support is needed unless otherwise documented below in the visit note. 

## 2016-05-20 NOTE — Assessment & Plan Note (Signed)
Pt has declined further xrt, d/w pt again today, pt will consider

## 2016-05-20 NOTE — Assessment & Plan Note (Signed)
[place] for ENT referral, r/o sinus involvement

## 2016-05-20 NOTE — Assessment & Plan Note (Signed)
With some worsening, pt not wanting xrt but might accept other tx if options, refer to optho

## 2016-05-21 ENCOUNTER — Telehealth: Payer: Self-pay | Admitting: *Deleted

## 2016-05-21 ENCOUNTER — Encounter: Payer: Self-pay | Admitting: Internal Medicine

## 2016-05-21 ENCOUNTER — Other Ambulatory Visit: Payer: Self-pay | Admitting: Hematology

## 2016-05-21 ENCOUNTER — Encounter: Payer: Self-pay | Admitting: *Deleted

## 2016-05-21 NOTE — Telephone Encounter (Signed)
Patient calling to ask why her upcoming scan has not been scheduled, routing notes to darlena clark, in managed care and dr Burr Medico

## 2016-05-29 ENCOUNTER — Telehealth: Payer: Self-pay | Admitting: Hematology

## 2016-05-29 NOTE — Telephone Encounter (Signed)
Returned pt's call asking to move 8/23 appt to 8/24 or 8/25. [facility] advising md has no avail appts on 8/24 or 8/25 therefore 8/23 appt has not been moved. Advise md has availability the next week and asked that they call back if interested in moving the appt further out.

## 2016-05-31 ENCOUNTER — Encounter (HOSPITAL_COMMUNITY)
Admission: RE | Admit: 2016-05-31 | Discharge: 2016-05-31 | Disposition: A | Discharge status: Home / Self Care | Payer: Medicare PPO | Source: Ambulatory Visit | Attending: Nurse Practitioner | Admitting: Nurse Practitioner

## 2016-05-31 ENCOUNTER — Ambulatory Visit (HOSPITAL_COMMUNITY)
Admission: RE | Admit: 2016-05-31 | Discharge: 2016-05-31 | Disposition: A | Discharge status: Home / Self Care | Payer: Medicare PPO | Source: Ambulatory Visit | Attending: Nurse Practitioner | Admitting: Nurse Practitioner

## 2016-05-31 DIAGNOSIS — I700 Atherosclerosis of aorta: Secondary | ICD-10-CM | POA: Insufficient documentation

## 2016-05-31 DIAGNOSIS — C50919 Malignant neoplasm of unspecified site of unspecified female breast: Secondary | ICD-10-CM

## 2016-05-31 DIAGNOSIS — C7951 Secondary malignant neoplasm of bone: Secondary | ICD-10-CM | POA: Diagnosis present

## 2016-05-31 DIAGNOSIS — I2510 Atherosclerotic heart disease of native coronary artery without angina pectoris: Secondary | ICD-10-CM | POA: Insufficient documentation

## 2016-05-31 DIAGNOSIS — R600 Localized edema: Secondary | ICD-10-CM | POA: Diagnosis not present

## 2016-05-31 DIAGNOSIS — J90 Pleural effusion, not elsewhere classified: Secondary | ICD-10-CM | POA: Insufficient documentation

## 2016-05-31 DIAGNOSIS — J9811 Atelectasis: Secondary | ICD-10-CM | POA: Insufficient documentation

## 2016-05-31 MED ORDER — TECHNETIUM TC 99M MEDRONATE IV KIT
21.2000 | PACK | Freq: Once | INTRAVENOUS | Status: AC | PRN
  Administered 2016-05-31: 21.2 via INTRAVENOUS

## 2016-05-31 MED ORDER — IOPAMIDOL (ISOVUE-300) INJECTION 61%
100.0000 mL | Freq: Once | INTRAVENOUS | Status: AC | PRN
  Administered 2016-05-31: 100 mL via INTRAVENOUS

## 2016-06-02 ENCOUNTER — Other Ambulatory Visit (HOSPITAL_BASED_OUTPATIENT_CLINIC_OR_DEPARTMENT_OTHER): Payer: Medicare PPO

## 2016-06-02 ENCOUNTER — Encounter: Payer: Self-pay | Admitting: Hematology

## 2016-06-02 ENCOUNTER — Ambulatory Visit (HOSPITAL_BASED_OUTPATIENT_CLINIC_OR_DEPARTMENT_OTHER): Payer: Medicare PPO | Admitting: Hematology

## 2016-06-02 ENCOUNTER — Telehealth: Payer: Self-pay | Admitting: Hematology

## 2016-06-02 ENCOUNTER — Ambulatory Visit (HOSPITAL_BASED_OUTPATIENT_CLINIC_OR_DEPARTMENT_OTHER): Payer: Medicare PPO

## 2016-06-02 VITALS — BP 129/70 | HR 61 | Temp 98.2°F | Resp 17 | Ht 65.0 in | Wt 202.8 lb

## 2016-06-02 DIAGNOSIS — C7951 Secondary malignant neoplasm of bone: Secondary | ICD-10-CM | POA: Diagnosis not present

## 2016-06-02 DIAGNOSIS — E538 Deficiency of other specified B group vitamins: Secondary | ICD-10-CM

## 2016-06-02 DIAGNOSIS — D630 Anemia in neoplastic disease: Secondary | ICD-10-CM

## 2016-06-02 DIAGNOSIS — I10 Essential (primary) hypertension: Secondary | ICD-10-CM

## 2016-06-02 DIAGNOSIS — C50511 Malignant neoplasm of lower-outer quadrant of right female breast: Secondary | ICD-10-CM

## 2016-06-02 DIAGNOSIS — J90 Pleural effusion, not elsewhere classified: Secondary | ICD-10-CM

## 2016-06-02 DIAGNOSIS — C50911 Malignant neoplasm of unspecified site of right female breast: Secondary | ICD-10-CM

## 2016-06-02 DIAGNOSIS — G893 Neoplasm related pain (acute) (chronic): Secondary | ICD-10-CM

## 2016-06-02 DIAGNOSIS — D649 Anemia, unspecified: Secondary | ICD-10-CM

## 2016-06-02 LAB — COMPREHENSIVE METABOLIC PANEL
ALBUMIN: 3.4 g/dL — AB (ref 3.5–5.0)
ALK PHOS: 48 U/L (ref 40–150)
ALT: <9 U/L (ref 0–55)
AST: 20 U/L (ref 5–34)
Anion Gap: 5 mEq/L (ref 3–11)
BUN: 10.5 mg/dL (ref 7.0–26.0)
CALCIUM: 8.9 mg/dL (ref 8.4–10.4)
CO2: 26 mEq/L (ref 22–29)
Chloride: 105 mEq/L (ref 98–109)
Creatinine: 0.7 mg/dL (ref 0.6–1.1)
GLUCOSE: 86 mg/dL (ref 70–140)
POTASSIUM: 4.1 meq/L (ref 3.5–5.1)
SODIUM: 136 meq/L (ref 136–145)
Total Bilirubin: 0.41 mg/dL (ref 0.20–1.20)
Total Protein: 6.7 g/dL (ref 6.4–8.3)

## 2016-06-02 LAB — CBC WITH DIFFERENTIAL/PLATELET
BASO%: 0.4 % (ref 0.0–2.0)
BASOS ABS: 0 10*3/uL (ref 0.0–0.1)
EOS ABS: 0 10*3/uL (ref 0.0–0.5)
EOS%: 0.9 % (ref 0.0–7.0)
HCT: 28.1 % — ABNORMAL LOW (ref 34.8–46.6)
HEMOGLOBIN: 9.2 g/dL — AB (ref 11.6–15.9)
LYMPH%: 50.4 % — ABNORMAL HIGH (ref 14.0–49.7)
MCH: 29.8 pg (ref 25.1–34.0)
MCHC: 32.7 g/dL (ref 31.5–36.0)
MCV: 90.9 fL (ref 79.5–101.0)
MONO#: 0.2 10*3/uL (ref 0.1–0.9)
MONO%: 10.6 % (ref 0.0–14.0)
NEUT#: 0.9 10*3/uL — ABNORMAL LOW (ref 1.5–6.5)
NEUT%: 37.7 % — ABNORMAL LOW (ref 38.4–76.8)
Platelets: 156 10*3/uL (ref 145–400)
RBC: 3.09 10*6/uL — ABNORMAL LOW (ref 3.70–5.45)
RDW: 15.3 % — AB (ref 11.2–14.5)
WBC: 2.3 10*3/uL — ABNORMAL LOW (ref 3.9–10.3)
lymph#: 1.1 10*3/uL (ref 0.9–3.3)

## 2016-06-02 MED ORDER — CYANOCOBALAMIN 1000 MCG/ML IJ SOLN
1000.0000 ug | Freq: Once | INTRAMUSCULAR | Status: AC
  Administered 2016-06-02: 1000 ug via INTRAMUSCULAR

## 2016-06-02 MED ORDER — PALBOCICLIB 125 MG PO CAPS
125.0000 mg | ORAL_CAPSULE | Freq: Every day | ORAL | 0 refills | Status: DC
Start: 2016-06-02 — End: 2016-07-01

## 2016-06-02 MED ORDER — DENOSUMAB 120 MG/1.7ML ~~LOC~~ SOLN
120.0000 mg | Freq: Once | SUBCUTANEOUS | Status: AC
  Administered 2016-06-02: 120 mg via SUBCUTANEOUS
  Filled 2016-06-02: qty 1.7

## 2016-06-02 MED FILL — *IBRANCE 125 MG CAPSULE: 125 | 28 days supply | Qty: 21 | Fill #0

## 2016-06-02 NOTE — Patient Instructions (Signed)
Cyanocobalamin, Vitamin B12 injection What is this medicine? CYANOCOBALAMIN (sye an oh koe BAL a min) is a man made form of vitamin B12. Vitamin B12 is used in the growth of healthy blood cells, nerve cells, and proteins in the body. It also helps with the metabolism of fats and carbohydrates. This medicine is used to treat people who can not absorb vitamin B12. This medicine may be used for other purposes; ask your health care provider or pharmacist if you have questions. What should I tell my health care provider before I take this medicine? They need to know if you have any of these conditions: -kidney disease -Leber's disease -megaloblastic anemia -an unusual or allergic reaction to cyanocobalamin, cobalt, other medicines, foods, dyes, or preservatives -pregnant or trying to get pregnant -breast-feeding How should I use this medicine? This medicine is injected into a muscle or deeply under the skin. It is usually given by a health care professional in a clinic or doctor's office. However, your doctor may teach you how to inject yourself. Follow all instructions. Talk to your pediatrician regarding the use of this medicine in children. Special care may be needed. Overdosage: If you think you have taken too much of this medicine contact a poison control center or emergency room at once. NOTE: This medicine is only for you. Do not share this medicine with others. What if I miss a dose? If you are given your dose at a clinic or doctor's office, call to reschedule your appointment. If you give your own injections and you miss a dose, take it as soon as you can. If it is almost time for your next dose, take only that dose. Do not take double or extra doses. What may interact with this medicine? -colchicine -heavy alcohol intake This list may not describe all possible interactions. Give your health care provider a list of all the medicines, herbs, non-prescription drugs, or dietary supplements you  use. Also tell them if you smoke, drink alcohol, or use illegal drugs. Some items may interact with your medicine. What should I watch for while using this medicine? Visit your doctor or health care professional regularly. You may need blood work done while you are taking this medicine. You may need to follow a special diet. Talk to your doctor. Limit your alcohol intake and avoid smoking to get the best benefit. What side effects may I notice from receiving this medicine? Side effects that you should report to your doctor or health care professional as soon as possible: -allergic reactions like skin rash, itching or hives, swelling of the face, lips, or tongue -blue tint to skin -chest tightness, pain -difficulty breathing, wheezing -dizziness -red, swollen painful area on the leg Side effects that usually do not require medical attention (report to your doctor or health care professional if they continue or are bothersome): -diarrhea -headache This list may not describe all possible side effects. Call your doctor for medical advice about side effects. You may report side effects to FDA at 1-800-FDA-1088. Where should I keep my medicine? Keep out of the reach of children. Store at room temperature between 15 and 30 degrees C (59 and 85 degrees F). Protect from light. Throw away any unused medicine after the expiration date. NOTE: This sheet is a summary. It may not cover all possible information. If you have questions about this medicine, talk to your doctor, pharmacist, or health care provider.    2016, Elsevier/Gold Standard. (2008-01-08 22:10:20) Denosumab injection What is this medicine?  DENOSUMAB (den oh sue mab) slows bone breakdown. Prolia is used to treat osteoporosis in women after menopause and in men. Delton See is used to prevent bone fractures and other bone problems caused by cancer bone metastases. Delton See is also used to treat giant cell tumor of the bone. This medicine may be  used for other purposes; ask your health care provider or pharmacist if you have questions. What should I tell my health care provider before I take this medicine? They need to know if you have any of these conditions: -dental disease -eczema -infection or history of infections -kidney disease or on dialysis -low blood calcium or vitamin D -malabsorption syndrome -scheduled to have surgery or tooth extraction -taking medicine that contains denosumab -thyroid or parathyroid disease -an unusual reaction to denosumab, other medicines, foods, dyes, or preservatives -pregnant or trying to get pregnant -breast-feeding How should I use this medicine? This medicine is for injection under the skin. It is given by a health care professional in a hospital or clinic setting. If you are getting Prolia, a special MedGuide will be given to you by the pharmacist with each prescription and refill. Be sure to read this information carefully each time. For Prolia, talk to your pediatrician regarding the use of this medicine in children. Special care may be needed. For Delton See, talk to your pediatrician regarding the use of this medicine in children. While this drug may be prescribed for children as young as 13 years for selected conditions, precautions do apply. Overdosage: If you think you have taken too much of this medicine contact a poison control center or emergency room at once. NOTE: This medicine is only for you. Do not share this medicine with others. What if I miss a dose? It is important not to miss your dose. Call your doctor or health care professional if you are unable to keep an appointment. What may interact with this medicine? Do not take this medicine with any of the following medications: -other medicines containing denosumab This medicine may also interact with the following medications: -medicines that suppress the immune system -medicines that treat cancer -steroid medicines like  prednisone or cortisone This list may not describe all possible interactions. Give your health care provider a list of all the medicines, herbs, non-prescription drugs, or dietary supplements you use. Also tell them if you smoke, drink alcohol, or use illegal drugs. Some items may interact with your medicine. What should I watch for while using this medicine? Visit your doctor or health care professional for regular checks on your progress. Your doctor or health care professional may order blood tests and other tests to see how you are doing. Call your doctor or health care professional if you get a cold or other infection while receiving this medicine. Do not treat yourself. This medicine may decrease your body's ability to fight infection. You should make sure you get enough calcium and vitamin D while you are taking this medicine, unless your doctor tells you not to. Discuss the foods you eat and the vitamins you take with your health care professional. See your dentist regularly. Brush and floss your teeth as directed. Before you have any dental work done, tell your dentist you are receiving this medicine. Do not become pregnant while taking this medicine or for 5 months after stopping it. Women should inform their doctor if they wish to become pregnant or think they might be pregnant. There is a potential for serious side effects to an unborn child. Talk  to your health care professional or pharmacist for more information. What side effects may I notice from receiving this medicine? Side effects that you should report to your doctor or health care professional as soon as possible: -allergic reactions like skin rash, itching or hives, swelling of the face, lips, or tongue -breathing problems -chest pain -fast, irregular heartbeat -feeling faint or lightheaded, falls -fever, chills, or any other sign of infection -muscle spasms, tightening, or twitches -numbness or tingling -skin blisters or  bumps, or is dry, peels, or red -slow healing or unexplained pain in the mouth or jaw -unusual bleeding or bruising Side effects that usually do not require medical attention (Report these to your doctor or health care professional if they continue or are bothersome.): -muscle pain -stomach upset, gas This list may not describe all possible side effects. Call your doctor for medical advice about side effects. You may report side effects to FDA at 1-800-FDA-1088. Where should I keep my medicine? This medicine is only given in a clinic, doctor's office, or other health care setting and will not be stored at home. NOTE: This sheet is a summary. It may not cover all possible information. If you have questions about this medicine, talk to your doctor, pharmacist, or health care provider.    2016, Elsevier/Gold Standard. (2012-03-27 12:37:47)

## 2016-06-02 NOTE — Telephone Encounter (Signed)
[facility] REPORT AND APPT SCHD GIVEN PER 08/23 LOS. °

## 2016-06-02 NOTE — Progress Notes (Signed)
[name]  [phone] [phone]  Clinic follow up Note   Patient Care Team: [name], MD as PCP - General  06/02/2016  CHIEF COMPLAINTS Follow up breast cancer     Breast cancer of lower-outer quadrant of right female breast ([name])   09/03/2008 Initial Diagnosis    right breast cancer, T2N1Mo, stage IIA, ER+, PR+, HER2+ (ration 2.07). Pt declined surgery or other treatment.        05/20/2011 Tumor Marker    right breast mass biopsy showed IDA, ER+/PR+/HER2- (ratio 1.37)      09/04/2011 Cancer Staging    PET scan showed no distant mets      10/08/2014 Progression     lumbar spine MRI showed multiple bone metastasis , highly suspicious for  metastatic disease.      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 Increased size of right pleural effusion, indeterminate for malignant involvement of the pleural fluids.      01/20/2016 Imaging    CT CAP with contrast showed small bilateral pleural effusion, right greater than left, similar osseous metastasis, no evidence of extraosseous metastasis. Bone scan showed no areas of suspicious uptake.        HISTORY OF PRESENTING ILLNESS:  Margaret Rubio 75 y.o. female  Was referred by her primary care physician to discuss the management probable metastatic breast cancer.  She was initially diagnosed with right breast cancer in November 2009. She underwent a right breast biopsy of a lesion in the 6:00 position that showed a carcinoma in situ associated with microscopic  foci of microinvasion. In December of 2009, she underwent a right axillary lymph node biopsy on 09/24/2008 which showed metastatic ductal carcinoma. (Case No: [id number]) The tumor was ER 100% positive, PR 0% negative, Ki67 - 22%. 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 The ratio of HER-2:CEP 17 was 2.07.  She decided to pursue nature therapy by diet and exercise, and did not have surgery and other tranditional therapy for her breast cancer.   In May 2011 she underwent a mammogram that showed a 3.8 cm invasive duct carcinoma in the subareolar region of the right breast which had increased since November 2009.  The tumor was ER  99% positive,  PR 4% positive, HER-2 negative ( ratio 1.37).  She was seen by Dr. Estella Husk and Dr. Lucia Gaskins.  However, she failed multiple appointments and sought alternative medical care in [place]. This involved putting something on her right breast that caused some scarring.  She noticed the enlarged right breast mass and nipple inversion about one year ago. No pain, but some skin change. She developed left low back/buttock and hip and  pain in Nov 2015, was seen by PCP Dr. Jenny Reichmann who prescribed vicodin and tramadol and pain improved with meds. Due to the persistent pain, she underwent a lumber MRI  On 10/08/2014, which showed multiple bone metastasis.  She run out pain medication about a week ago,  But did not call for refill. She states her pain is  not bad , but she appears to be uncomfortable when she stands up and walks.  She otherwise feels well overall,  Has good appetite and energy level. She lives alone and still functions well.  She comes in with her niece today.   Current therapy:  1.Anastrozole 1 mg once daily started on 12/10/2014 2. Ibrance added on 07/29/15, stopped in Nov 2016, restarted on 4/1  3. Xgeva started on 08/15/2015  INTERIM HISTORY: [name] returns for follow-up. She is doing well overall. She was out of town for week last week, and she  forgot to take her anastrozole and Ibrance. She developed some skin rash and no extremity edema last week, and was seen by our symptom management clinic, Doppler was negative for DVT. Her skin rash has resolved, possibly related to her food. No other new complaints. She denies any significant pain, dyspnea, or other symptoms. She has moderate fatigue, able to tolerate a routine daily activities. She has gained about 10 pounds in the past several months.  MEDICAL HISTORY:  Past Medical History:  Diagnosis Date  . 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 ASTHMA 01/31/2008  . BREAST CANCER, HX OF 11/14/2009  . BRONCHITIS, ACUTE 01/31/2008  . Cough 10/17/2009  . FATIGUE 11/14/2009  . GANGLION CYST 05/01/2009  . HYPERLIPIDEMIA 05/21/2007  . HYPERTENSION 05/05/2007  . LOW BACK PAIN 05/21/2007  . MENOPAUSAL DISORDER 05/22/2010  . Pain in joint, lower leg 05/10/2008  . RASH-NONVESICULAR 05/01/2009  . rt breast ca dx'd 2009   right breast  . SHOULDER PAIN, LEFT 11/14/2009  . 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 VITAMIN B12 DEFICIENCY 05/23/2010    SURGICAL HISTORY: Past Surgical History:  Procedure Laterality Date  . MYOMECTOMY    . removal of neck lump     benigh 1986    SOCIAL HISTORY: History   Social History  . Marital Status: Widowed    Spouse Name: N/A    Number of Children: 42, lives in [place].   . Years of Education: N/A   Occupational History  . Not on file.   Social History Main Topics  . Smoking status: Never Smoker   . Smokeless tobacco: Not on file  . Alcohol Use: No  . Drug Use: No  . Sexual Activity: Not on file   Other Topics Concern  . Not on file   Social History Narrative    FAMILY HISTORY: Family History  Problem Relation Age of Onset  . Hypertension Father   . Heart disease Father   . Asthma Other   . Diabetes Other   . Hypertension Other   Paternal aunt had cancer, unknown type. No other family history of malignancy   ALLERGIES:  is allergic to tomato.  MEDICATIONS:    Current Outpatient Prescriptions on File Prior to Visit  Medication Sig Dispense Refill  . anastrozole (ARIMIDEX) 1 MG tablet Take 1 tablet (1 mg total) by mouth daily. 30 tablet 9  . Ascorbic Acid (VITAMIN C) 100 MG CHEW Chew 1 tablet (100 mg total) by mouth every morning. 90 each 3  . ergocalciferol (VITAMIN D2) 50000 UNITS capsule Take 1 capsule (50,000 Units total) by mouth once a week. 4 capsule 5   No current facility-administered medications on file prior to visit.   ; REVIEW OF SYSTEMS:   Constitutional: Denies fevers, chills or abnormal night sweats Eyes: Denies blurriness of vision, double vision or watery eyes Ears, nose, mouth, throat, and face: Denies mucositis or sore throat Respiratory: Denies cough, dyspnea or  wheezes Cardiovascular: Denies palpitation, chest discomfort or lower extremity swelling Gastrointestinal:  Denies nausea, heartburn or change in bowel habits Skin: Denies abnormal skin rashes Lymphatics: Denies new lymphadenopathy or easy bruising Neurological:Denies numbness, tingling or new weaknesses Behavioral/Psych: Mood is stable, no new changes  All other systems were reviewed with the patient and are negative.  PHYSICAL EXAMINATION: ECOG PERFORMANCE STATUS: 1 BP 129/70 (BP Location: Left Arm, Patient Position: Sitting)   Pulse 61   Temp 98.2 F (36.8 C) (Oral)   Resp 17   Ht '5\' 5"'$  (1.651 m)   Wt 202 lb 12.8 oz (92 kg)   SpO2 100%   BMI 33.75 kg/m   GENERAL:alert, no distress and comfortable SKIN: skin color, texture, turgor are normal, no rashes or significant lesions EYES: normal, conjunctiva are pink and non-injected, sclera clear OROPHARYNX:no exudate, no erythema and lips, buccal mucosa, and tongue normal  NECK: supple, thyroid normal size, non-tender, without nodularity LYMPH:  no palpable lymphadenopathy in the cervical, axillary or inguinal LUNGS: clear to auscultation and percussion with normal breathing effort, slightly decreased breath  sounds at the bottom of right lung. HEART: regular rate & rhythm and no murmurs and no lower extremity edema ABDOMEN:abdomen soft, non-tender and normal bowel sounds Musculoskeletal:no cyanosis of digits and no clubbing  PSYCH: alert & oriented x 3 with fluent speech NEURO: no focal motor/sensory deficits Breasts: Breast inspection showed  Significantly enlarged right breast, compared to left breast, (+) skin pigmentations , nipple inversion and a dry ulcer/ scar below the nipple, no discharge.  The right breast is softer than before, nontender , no discrete mass. Palpation of the  Left breasts and axilla revealed no obvious mass that I could appreciate.   LABORATORY DATA:  I have reviewed the data as listed CBC Latest Ref Rng & Units 06/02/2016 05/05/2016 04/05/2016  WBC 3.9 - 10.3 10e3/uL 2.3(L) 3.9 4.2  Hemoglobin 11.6 - 15.9 g/dL 9.2(L) 10.4(L) 9.2(L)  Hematocrit 34.8 - 46.6 % 28.1(L) 31.6(L) 28.3(L)  Platelets 145 - 400 10e3/uL 156 217 209    CMP Latest Ref Rng & Units 06/02/2016 05/05/2016 04/05/2016  Glucose 70 - 140 mg/dl 86 119 84  BUN 7.0 - 26.0 mg/dL 10.5 10.0 11.1  Creatinine 0.6 - 1.1 mg/dL 0.7 0.8 0.7  Sodium 136 - 145 mEq/L 136 140 139  Potassium 3.5 - 5.1 mEq/L 4.1 3.7 3.6  Chloride 96 - 112 mEq/L - - -  CO2 22 - 29 mEq/L '26 25 25  '$ Calcium 8.4 - 10.4 mg/dL 8.9 8.7 9.0  Total Protein 6.4 - 8.3 g/dL 6.7 7.0 6.6  Total Bilirubin 0.20 - 1.20 mg/dL 0.41 0.43 0.31  Alkaline Phos 40 - 150 U/L 48 58 61  AST 5 - 34 U/L '20 22 25  '$ ALT 0 - 55 U/L '9 11 14   '$ CA 27.29  Status: Finalresult Visible to patient:  Not Released Nextappt: 04/05/2016 at 01:00 PM in Oncology ([facility]) Dx:  Breast cancer of lower-outer quadrant...              Ref Range 12d ago (03/02/16) 77moago (02/03/16) 152mogo (01/15/16)    CA 27.29 0.0 - 38.6 U/mL 429.2 (H) 415.9 (H)CM 402.4 (H)CM          PATHOLOGY REPORT  RIGHT BREAST MASS, 6 O'CLOCK, NEEDLE CORE BIOPSIES:  09/03/2008 MICROSCOPIC FOCUS OF DUCTAL CARCINOMA IN SITU ASSOCIATED WITH MICROSCOPIC FOCUS OF MICROINVASION. SEE COMMENT.  LYMPH NODE, RIGHT AXILLARY, NEEDLE CORE BIOPSIES: METASTATIC DUCTAL CARCINOMA.  09/24/2008  COMMENT There are needle core biopsies of lymph node showing partial replacement by metastatic adenocarcinoma consistent with metastasis from a breast primary, metastatic ductal carcinoma. Since the breast prognostic profile is not performed on the previous material because of limited invasive tumor ([id number]), the breast prognostic profile will be performed on the tumor in the lymph node and reported in a separate report. Interpretation HER-2/NEU BY CISH -- SHOWS AMPLIFICATION BY CISH ANALYSIS. THE RATIO OF HER-2: CEP 17 SIGNALS WAS 2.07  Diagnosis 05/20/2011 Breast, right, needle core biopsy - INVASIVE DUCTAL CARCINOMA, SEE COMMENT. - LYMPHOVASCULAR INVASION PRESENT. Estrogen Receptor (Negative, <1%): 99%, STRONG STAINING INTENSITY Progesterone Receptor (Negative, <1%): 4%, STRONG HER-2/NEU BY CISH - NO AMPLIFICATION OF HER-2 DETECTED. THE RATIO OF HER-2: CEP 17 SIGNALS WAS 1.38.  Breast, right, needle core biopsy, 6:30 o'clock 12/13/2014 - INVASIVE MAMMARY CARCINOMA. - SEE MICROSCOPIC DESCRIPTION. Microscopic Comment The features favor Grade II invasive ductal carcinoma. A breast prognostic profile will be performed. Dr. Frederica Kuster agrees. The results were called to [facility] on 12/16/14. (JDP:ds 12/16/14) ADDENDUM: Immunohistochemistry for E-cadherin is performed and shows diffuse strong positivity consistent with invasive ductal carcinoma. Estrogen Receptor: 100%, POSITIVE, STRONG STAINING INTENSITY Progesterone Receptor: 2%, POSITIVE, STRONG STAINING INTENSITY Proliferation Marker Ki67: 27% HER-2/NEU BY CISH - NEGATIVE. RESULT RATIO OF HER2: CEP 17 SIGNALS 1.45 AVERAGE HER2 COPY NUMBER PER CELL 2.25  Diagnosis 08/01/2015 PLEURAL  FLUID, RIGHT(SPECIMEN 1 OF 1 COLLECTED 08/01/15): METASTATIC CARCINOMA CONSISTENT WITH BREAST PRIMARY.   RADIOGRAPHIC STUDIES: I have personally reviewed the radiological images as listed and agreed with the findings in the report.  PET 07/17/2015 IMPRESSION: 1. Suspected active diverticulitis along the sigmoid colon, with focal mesenteric stranding and a small locule of gas which is either within an inflamed diverticulum or a micro perforation. No overt free air. 2. Overall similar appearance of multifocal right breast cancer with extensive osseous metastatic disease. The right axillary lymph nodes are less notable that there is a borderline hypermetabolic left axillary lymph node currently. 3. Increasing size of the right pleural effusion, now moderate. Appearance indeterminate for malignant involvement of the pleural fluid. 4. Faintly increased activity in a right infrahilar lymph node which could represent early metastatic spread. 5. Other imaging findings of potential clinical significance: Chronic left maxillary sinusitis. Low-density blood pool suggests anemia. Left adrenal adenoma. These results will be called to the ordering clinician or representative by the Radiologist Assistant, and communication documented in the PACS or zVision Dashboard.  Brain MRI with and without contrast on 06/27/2015, at [facility] -In determinate, ill-defined soft tissue infiltration in the left orbit with questionable enhancement, further evaluation with CT orbits with contrast is recommended to the cyst in ruling out metastasis. -No intracranial metastasis -Diffusely hypoechoic intense bone marrow signal throughout the skull base   Bone scan 05/31/2016 IMPRESSION: New increased up take in the ribs bilaterally worrisome for metastatic disease. Stable increased uptake in the right fronto poor parietal bone.  A bilateral rib series is  recommended.   CT chest, abdomen and pelvis with contrast 05/31/2016 IMPRESSION: 1. Similar fit appearance of widespread it treated osseous metastatic disease, no progression. No findings of new extra osseous metastatic disease. 2. Continued skin thickening and edema in the right breast. The edema in the right breast and tracking in the right chest wall is slightly increased compared to prior and the lateral soft tissue density in the right lateral breast is stable. 3. Similar small bilateral  pleural effusions with some atelectasis at the right lung base possibly tethered to the parietal pleura. 4. Mild coronary artery atherosclerosis and atherosclerotic calcification of the aortic arch. 5. Airway thickening is present, suggesting bronchitis or reactive airways disease.    ASSESSMENT & PLAN:   Margaret Rubio is a 75 year old  African-American female, who was diagnosed with node positive right breast  Invasive adenocarcinoma in  2009, untreated,  Now presents with low back pain and lumbar spine MRI is highly suspicious for bone metastasis.  1. Right breast IDA,  Strongly ER positive,  PR weakly positive,  HER-2 positive on first biopsy , but negative on second and third  biopsy 3 and 6 years later, with diffuse bone metastatic and right pleura, T4NxM1 stage IV  - it has been over 6 years since her initial diagnosis. Her lumbar MRI is very suspicious for bone metastasis.  Her disease course is more consistent with ER positive , HER-2 negative disease. - I discussed her initial PET scan results with her and her daughter. 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 -she knows that her disease at this stage is incurable. The goal of treatment is palliative and to prolong her life. -she is on first line anastrozole, Leslee Home was added on in October 2016 due to slight disease progression. She was not very compliant, off Ibrance for 3-4 months and restarted in 01/2016. -I  discussed her restaging CT and bone scan from 05/31/2016, which showed diffuse similar appearing bone lesions, no other new metastasis. Bone scan showed mild bilateral rib uptake, but no corresponding findings on the CT. I think she has stable disease overall. -She is clinically doing well, tolerating Ibrance and anastrozole well, we'll continue. Patient is not very compliant, I strongly encouraged her to continue treatment. -Lab results reviewed, she has mild neutropenia, stable anemia, we'll continue monitoring  2. Leg edema, L>R -Doppler was negative for DVT on 03/29/16. She had remote left knee surgery. -Improved. She knows to elevate her legs when she sits.  3. Low back/ hip pain,  Secondary to metastatic cancer -this has resolved since she started anastrozole -continue Xgeva monthly  4. Anemia -Likely secondary to her breast cancer, bone metastases and the B12 deficiency -Slightly improved after B12 injections. Continue B12 monthly for now -No need for transfusion. We'll follow up closely.  5. Left orbit bone metastasis  -This was discovered on her prior brain MRI in September 2016 when she had headaches -her headache has resolved since she restarted Ibrance.  -She was seen by ophthalmologist at [place], and was recently referred to [facility].  Plan: -Xgeva and B12 injection today, and every 4 weeks -she will start Ibrance next Tuesday 8/29, and continue anastrozole -I'll see you back in 4 weeks with lab and injections.  All questions were answered. The patient knows to call the clinic with any problems, questions or concerns. I spent 20 minutes counseling the patient face to face. The total time spent in the appointment was 25 minutes and more than 50% was on counseling.     Truitt Merle, MD 06/02/2016

## 2016-06-03 LAB — CANCER ANTIGEN 27.29: CA 27.29: 332.4 U/mL — ABNORMAL HIGH (ref 0.0–38.6)

## 2016-06-03 MED FILL — ANASTROZOLE 1 MG TABLET: 1 | 30 days supply | Qty: 30 | Fill #2

## 2016-06-25 ENCOUNTER — Encounter: Payer: Self-pay | Admitting: Pharmacist

## 2016-06-25 LAB — GLUCOSE, POCT (MANUAL RESULT ENTRY): POC GLUCOSE: 93 mg/dL (ref 70–99)

## 2016-06-25 NOTE — Progress Notes (Unsigned)
Patient seen today at health event at [facility] by Marliss Coots, NP.  BP WNL on re-check BG [phone]

## 2016-07-01 ENCOUNTER — Other Ambulatory Visit (HOSPITAL_BASED_OUTPATIENT_CLINIC_OR_DEPARTMENT_OTHER): Payer: Medicare PPO

## 2016-07-01 ENCOUNTER — Ambulatory Visit (HOSPITAL_BASED_OUTPATIENT_CLINIC_OR_DEPARTMENT_OTHER): Payer: Medicare PPO

## 2016-07-01 ENCOUNTER — Ambulatory Visit (HOSPITAL_BASED_OUTPATIENT_CLINIC_OR_DEPARTMENT_OTHER): Payer: Medicare PPO | Admitting: Hematology

## 2016-07-01 ENCOUNTER — Encounter: Payer: Self-pay | Admitting: *Deleted

## 2016-07-01 ENCOUNTER — Telehealth: Payer: Self-pay | Admitting: Hematology

## 2016-07-01 VITALS — BP 139/78 | HR 86 | Temp 98.4°F | Resp 18 | Ht 65.0 in | Wt 198.6 lb

## 2016-07-01 DIAGNOSIS — C7951 Secondary malignant neoplasm of bone: Secondary | ICD-10-CM

## 2016-07-01 DIAGNOSIS — I10 Essential (primary) hypertension: Secondary | ICD-10-CM

## 2016-07-01 DIAGNOSIS — C50511 Malignant neoplasm of lower-outer quadrant of right female breast: Secondary | ICD-10-CM

## 2016-07-01 DIAGNOSIS — D519 Vitamin B12 deficiency anemia, unspecified: Secondary | ICD-10-CM

## 2016-07-01 DIAGNOSIS — E538 Deficiency of other specified B group vitamins: Secondary | ICD-10-CM

## 2016-07-01 DIAGNOSIS — D630 Anemia in neoplastic disease: Secondary | ICD-10-CM

## 2016-07-01 DIAGNOSIS — Z170 Estrogen receptor positive status [ER+]: Secondary | ICD-10-CM | POA: Diagnosis not present

## 2016-07-01 DIAGNOSIS — J90 Pleural effusion, not elsewhere classified: Secondary | ICD-10-CM | POA: Diagnosis not present

## 2016-07-01 DIAGNOSIS — G893 Neoplasm related pain (acute) (chronic): Secondary | ICD-10-CM

## 2016-07-01 LAB — CBC WITH DIFFERENTIAL/PLATELET
BASO%: 0.6 % (ref 0.0–2.0)
BASOS ABS: 0 10*3/uL (ref 0.0–0.1)
EOS ABS: 0.1 10*3/uL (ref 0.0–0.5)
EOS%: 1.4 % (ref 0.0–7.0)
HEMATOCRIT: 29.4 % — AB (ref 34.8–46.6)
HEMOGLOBIN: 9.7 g/dL — AB (ref 11.6–15.9)
LYMPH#: 0.9 10*3/uL (ref 0.9–3.3)
LYMPH%: 23.5 % (ref 14.0–49.7)
MCH: 29.7 pg (ref 25.1–34.0)
MCHC: 33 g/dL (ref 31.5–36.0)
MCV: 89.9 fL (ref 79.5–101.0)
MONO#: 0.1 10*3/uL (ref 0.1–0.9)
MONO%: 3.3 % (ref 0.0–14.0)
NEUT%: 71.2 % (ref 38.4–76.8)
NEUTROS ABS: 2.6 10*3/uL (ref 1.5–6.5)
PLATELETS: 228 10*3/uL (ref 145–400)
RBC: 3.27 10*6/uL — ABNORMAL LOW (ref 3.70–5.45)
RDW: 14.5 % (ref 11.2–14.5)
WBC: 3.6 10*3/uL — AB (ref 3.9–10.3)

## 2016-07-01 LAB — COMPREHENSIVE METABOLIC PANEL
ALBUMIN: 3.5 g/dL (ref 3.5–5.0)
ALT: 13 U/L (ref 0–55)
AST: 21 U/L (ref 5–34)
Alkaline Phosphatase: 57 U/L (ref 40–150)
Anion Gap: 8 mEq/L (ref 3–11)
BUN: 13.3 mg/dL (ref 7.0–26.0)
CALCIUM: 9.3 mg/dL (ref 8.4–10.4)
CO2: 26 mEq/L (ref 22–29)
Chloride: 108 mEq/L (ref 98–109)
Creatinine: 0.9 mg/dL (ref 0.6–1.1)
EGFR: 70 mL/min/{1.73_m2} — AB (ref >90)
GLUCOSE: 129 mg/dL (ref 70–140)
POTASSIUM: 3.8 meq/L (ref 3.5–5.1)
Sodium: 142 mEq/L (ref 136–145)
TOTAL PROTEIN: 6.9 g/dL (ref 6.4–8.3)
Total Bilirubin: 0.41 mg/dL (ref 0.20–1.20)

## 2016-07-01 MED ORDER — PALBOCICLIB 125 MG PO CAPS: 125.0000 mg | ORAL_CAPSULE | Freq: Every day | ORAL | 0 refills | Status: DC

## 2016-07-01 MED ORDER — CYANOCOBALAMIN 1000 MCG/ML IJ SOLN
1000.0000 ug | Freq: Once | INTRAMUSCULAR | Status: AC
  Administered 2016-07-01: 1000 ug via INTRAMUSCULAR

## 2016-07-01 MED ORDER — DENOSUMAB 120 MG/1.7ML ~~LOC~~ SOLN
120.0000 mg | Freq: Once | SUBCUTANEOUS | Status: AC
Start: 2016-07-01 — End: 2016-07-01
  Administered 2016-07-01: 120 mg via SUBCUTANEOUS
  Filled 2016-07-01: qty 1.7

## 2016-07-01 NOTE — Progress Notes (Signed)
Pt declined flu vaccine today. Stated she will think about it.

## 2016-07-01 NOTE — Telephone Encounter (Signed)
Avs report and schedule given per 07/01/16 los.

## 2016-07-01 NOTE — Progress Notes (Signed)
[place]  [phone] [phone]  Clinic follow up Note   Patient Care Team: [name], MD as PCP - General  07/01/2016  CHIEF COMPLAINTS Follow up breast cancer     Breast cancer of lower-outer quadrant of right female breast ([place])   09/03/2008 Initial Diagnosis    right breast cancer, T2N1Mo, stage IIA, ER+, PR+, HER2+ (ration 2.07). Pt declined surgery or other treatment.        05/20/2011 Tumor Marker    right breast mass biopsy showed IDA, ER+/PR+/HER2- (ratio 1.37)      09/04/2011 Cancer Staging    PET scan showed no distant mets      10/08/2014 Progression     lumbar spine MRI showed multiple bone metastasis , highly suspicious for  metastatic disease.      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 Increased size of right pleural effusion, indeterminate for malignant involvement of the pleural fluids.      01/20/2016 Imaging    CT CAP with contrast showed small bilateral pleural effusion, right greater than left, similar osseous metastasis, no evidence of extraosseous metastasis. Bone scan showed no areas of suspicious uptake.        HISTORY OF PRESENTING ILLNESS:  Margaret MIGUEZ89y.o. female  Was referred by her primary care physician to discuss the management probable metastatic breast cancer.  She was initially diagnosed with right breast cancer in November 2009. She underwent a right breast biopsy of a lesion in the 6:00 position that showed a carcinoma in situ associated with microscopic  foci of microinvasion. In December of 2009, she underwent a right axillary lymph node biopsy on 09/24/2008 which showed metastatic ductal carcinoma. (Case No: [id number] The tumor was ER 100% positive, PR 0% negative, Ki67 - 22%. 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 The ratio of HER-2:CEP 17 was 2.07.  She decided to pursue nature therapy by diet and exercise, and did not have surgery and other tranditional therapy for her breast cancer.   In May 2011 she underwent a mammogram that showed a 3.8 cm invasive duct carcinoma in the subareolar region of the right breast which had increased since November 2009.  The tumor was ER  99% positive,  PR 4% positive, HER-2 negative ( ratio 1.37).  She was seen by Dr. EEstella Huskand Dr. NLucia Gaskins  However, she failed multiple appointments [place] involved putting something on her right breast that caused some scarring.  She noticed the enlarged right breast mass and nipple inversion about one year ago. No pain, but some skin change. She developed left low back/buttock and hip and  pain in Nov 2015, was seen by PCP Dr. JJenny Reichmannwho prescribed vicodin and tramadol and pain improved with meds. Due to the persistent pain, she underwent a lumber MRI  On 10/08/2014, which showed multiple bone metastasis.  She run out pain medication about a week ago,  But did not call for refill. She states her pain is  not bad , but she appears to be uncomfortable when she stands up and walks.  She otherwise feels well overall,  Has good appetite and energy level. She lives alone and still functions well.  She comes in with her niece today.   Current therapy:  1.Anastrozole 1 mg once daily started on 12/10/2014 2. Ibrance added on 07/29/15, stopped in Nov 2016, restarted on 4/1  3. Xgeva started on 08/15/2015  INTERIM HISTORY: Margaret Rubio returns for follow-up. She is accompanied by her daughter to clinic today. She is doing well overall,  she still has mild occasional headaches, does not take pain medication. She denies any other new pain, her appetite and and the level remain well overall. She functions well at home. She denies any blurred vision, but left eye ball has limited range of movement. She was seen by ophthalmologist Dr. Emilio Math at [facility] recently.   MEDICAL HISTORY:  Past Medical History:  Diagnosis Date  . 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 ASTHMA 01/31/2008  . BREAST CANCER, HX OF 11/14/2009  . BRONCHITIS, ACUTE 01/31/2008  . Cough 10/17/2009  . FATIGUE 11/14/2009  . GANGLION CYST 05/01/2009  . HYPERLIPIDEMIA 05/21/2007  . HYPERTENSION 05/05/2007  . LOW BACK PAIN 05/21/2007  . MENOPAUSAL DISORDER 05/22/2010  . Pain in joint, lower leg 05/10/2008  . RASH-NONVESICULAR 05/01/2009  . rt breast ca dx'd 2009   right breast  . SHOULDER PAIN, LEFT 11/14/2009  . 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 VITAMIN B12 DEFICIENCY 05/23/2010    SURGICAL HISTORY: Past Surgical History:  Procedure Laterality Date  . MYOMECTOMY    . removal of neck lump     benigh 1986    SOCIAL HISTORY: History   Social History  . Marital Status: Widowed    Spouse Name: N/A    Number of Children: 3, lives in [place].   . Years of Education: N/A   Occupational History  . Not on file.   Social History Main Topics  . Smoking status: Never Smoker   . Smokeless tobacco: Not on file  . Alcohol Use: No  . Drug Use: No  . Sexual Activity: Not on file   Other Topics Concern  . Not on file   Social History Narrative    FAMILY HISTORY: Family History  Problem Relation Age of Onset  . Hypertension Father   . Heart disease Father   . Asthma Other   . Diabetes Other   . Hypertension Other   Paternal aunt had cancer, unknown type. No other family history of malignancy   ALLERGIES:  is allergic to tomato.  MEDICATIONS:  Current Outpatient Prescriptions on File Prior to Visit  Medication Sig Dispense Refill  . anastrozole (ARIMIDEX) 1 MG tablet  Take 1 tablet (1 mg total) by mouth daily. 30 tablet 9  . Ascorbic Acid (VITAMIN C) 100 MG CHEW Chew 1 tablet (100 mg total) by mouth every morning. 90 each 3  . ergocalciferol (VITAMIN D2) 50000 UNITS capsule Take 1 capsule (50,000 Units total) by mouth once a week. 4 capsule 5  . palbociclib (IBRANCE) 125 MG capsule Take 1 capsule (125 mg total) by mouth daily with breakfast. Take whole with food. Take for 21 days. Repeat every 28 days 21 capsule 0   No current facility-administered medications on file prior to visit.   ; REVIEW OF SYSTEMS:   Constitutional: Denies fevers, chills or abnormal night sweats Eyes: Denies blurriness of vision, double vision or watery eyes Ears, nose, mouth, throat, and face:  Denies mucositis or sore throat Respiratory: Denies cough, dyspnea or wheezes Cardiovascular: Denies palpitation, chest discomfort or lower extremity swelling Gastrointestinal:  Denies nausea, heartburn or change in bowel habits Skin: Denies abnormal skin rashes Lymphatics: Denies new lymphadenopathy or easy bruising Neurological:Denies numbness, tingling or new weaknesses Behavioral/Psych: Mood is stable, no new changes  All other systems were reviewed with the patient and are negative.  PHYSICAL EXAMINATION: ECOG PERFORMANCE STATUS: 1 BP 139/78 (BP Location: Left Arm, Patient Position: Sitting)   Pulse 86   Temp 98.4 F (36.9 C)   Resp 18   Ht _0  (1.651 m)   Wt 198 lb 9.6 oz (90.1 kg)   SpO2 99%   BMI 33.05 kg/m   GENERAL:alert, no distress and comfortable SKIN: skin color, texture, turgor are normal, no rashes or significant lesions EYES: normal, conjunctiva are pink and non-injected, sclera clear OROPHARYNX:no exudate, no erythema and lips, buccal mucosa, and tongue normal  NECK: supple, thyroid normal size, non-tender, without nodularity LYMPH:  no palpable lymphadenopathy in the cervical, axillary or inguinal LUNGS: clear to auscultation and percussion with normal  breathing effort, slightly decreased breath sounds at the bottom of right lung. HEART: regular rate & rhythm and no murmurs and no lower extremity edema ABDOMEN:abdomen soft, non-tender and normal bowel sounds Musculoskeletal:no cyanosis of digits and no clubbing  PSYCH: alert & oriented x 3 with fluent speech NEURO: no focal motor/sensory deficits. Her left eye ball has limited range of movement  Breasts: Breast inspection showed  Significantly enlarged right breast, compared to left breast, (+) skin pigmentations , nipple inversion and a dry ulcer/ scar below the nipple, no discharge.  The right breast is softer than before, nontender , no discrete mass. Palpation of the  Left breasts and axilla revealed no obvious mass that I could appreciate.   LABORATORY DATA:  I have reviewed the data as listed CBC Latest Ref Rng & Units 07/01/2016 06/02/2016 05/05/2016  WBC 3.9 - 10.3 10e3/uL 3.6(L) 2.3(L) 3.9  Hemoglobin 11.6 - 15.9 g/dL 9.7(L) 9.2(L) 10.4(L)  Hematocrit 34.8 - 46.6 % 29.4(L) 28.1(L) 31.6(L)  Platelets 145 - 400 10e3/uL 228 156 217    CMP Latest Ref Rng & Units 07/01/2016 06/02/2016 05/05/2016  Glucose 70 - 140 mg/dl 129 86 119  BUN 7.0 - 26.0 mg/dL 13.3 10.5 10.0  Creatinine 0.6 - 1.1 mg/dL 0.9 0.7 0.8  Sodium 136 - 145 mEq/L 142 136 140  Potassium 3.5 - 5.1 mEq/L 3.8 4.1 3.7  Chloride 96 - 112 mEq/L - - -  CO2 22 - 29 mEq/L _1 Calcium 8.4 - 10.4 mg/dL 9.3 8.9 8.7  Total Protein 6.4 - 8.3 g/dL 6.9 6.7 7.0  Total Bilirubin 0.20 - 1.20 mg/dL 0.41 0.41 0.43  Alkaline Phos 40 - 150 U/L 57 48 58  AST 5 - 34 U/L _2 ALT 0 - 55 U/L 13 <9 11   CA27.29: 06/30/2012: 28 10/30/2014: 355 08/15/2015: 424 12/27/2014: 414 05/05/2016: 416.1 06/10/2016: 332.4   PATHOLOGY REPORT  RIGHT BREAST MASS, 6 O'CLOCK, NEEDLE CORE BIOPSIES: 09/03/2008 MICROSCOPIC FOCUS OF DUCTAL CARCINOMA IN SITU ASSOCIATED WITH MICROSCOPIC FOCUS OF MICROINVASION. SEE COMMET.  LYMPH NODE, RIGHT  AXILLARY, NEEDLE CORE BIOPSIES: METASTATIC DUCTAL CARCINOMA.  09/24/2008  COMMENT There are needle core biopsies of lymph node showing partial replacement by metastatic adenocarcinoma consistent with metastasis from a breast primary, metastatic ductal carcinoma. Since the breast prognostic profile is not performed on the previous material because of  limited invasive tumor ([id number]), the breast prognostic profile will be performed on the tumor in the lymph node and reported in a separate report. Interpretation HER-2/NEU BY CISH -- SHOWS AMPLIFICATION BY CISH ANALYSIS. THE RATIO OF HER-2: CEP 17 SIGNALS WAS 2.07  Diagnosis 05/20/2011 Breast, right, needle core biopsy - INVASIVE DUCTAL CARCINOMA, SEE COMMENT. - LYMPHOVASCULAR INVASION PRESENT. Estrogen Receptor (Negative, <1%): 99%, STRONG STAINING INTENSITY Progesterone Receptor (Negative, <1%): 4%, STRONG HER-2/NEU BY CISH - NO AMPLIFICATION OF HER-2 DETECTED. THE RATIO OF HER-2: CEP 17 SIGNALS WAS 1.38.  Breast, right, needle core biopsy, 6:30 o'clock 12/13/2014 - INVASIVE MAMMARY CARCINOMA. - SEE MICROSCOPIC DESCRIPTION. Microscopic Comment The features favor Grade II invasive ductal carcinoma. A breast prognostic profile will be performed. Dr. Donato Heinz agrees. The results were called to [facility] on 12/16/14. (JDP:ds 12/16/14) ADDENDUM: Immunohistochemistry for E-cadherin is performed and shows diffuse strong positivity consistent with invasive ductal carcinoma. Estrogen Receptor: 100%, POSITIVE, STRONG STAINING INTENSITY Progesterone Receptor: 2%, POSITIVE, STRONG STAINING INTENSITY Proliferation Marker Ki67: 27% HER-2/NEU BY CISH - NEGATIVE. RESULT RATIO OF HER2: CEP 17 SIGNALS 1.45 AVERAGE HER2 COPY NUMBER PER CELL 2.25  Diagnosis 08/01/2015 PLEURAL FLUID, RIGHT(SPECIMEN 1 OF 1 COLLECTED 08/01/15): METASTATIC CARCINOMA CONSISTENT WITH BREAST PRIMARY.   RADIOGRAPHIC STUDIES: I have personally  reviewed the radiological images as listed and agreed with the findings in the report.  PET 07/17/2015 IMPRESSION: 1. Suspected active diverticulitis along the sigmoid colon, with focal mesenteric stranding and a small locule of gas which is either within an inflamed diverticulum or a micro perforation. No overt free air. 2. Overall similar appearance of multifocal right breast cancer with extensive osseous metastatic disease. The right axillary lymph nodes are less notable that there is a borderline hypermetabolic left axillary lymph node currently. 3. Increasing size of the right pleural effusion, now moderate. Appearance indeterminate for malignant involvement of the pleural fluid. 4. Faintly increased activity in a right infrahilar lymph node which could represent early metastatic spread. 5. Other imaging findings of potential clinical significance: Chronic left maxillary sinusitis. Low-density blood pool suggests anemia. Left adrenal adenoma. These results will be called to the ordering clinician or representative by the Radiologist Assistant, and communication documented in the PACS or zVision Dashboard.  Brain MRI with and without contrast on 06/27/2015, at [facility] -In determinate, ill-defined soft tissue infiltration in the left orbit with questionable enhancement, further evaluation with CT orbits with contrast is recommended to the cyst in ruling out metastasis. -No intracranial metastasis -Diffusely hypoechoic intense bone marrow signal throughout the skull base   Bone scan 05/31/2016 IMPRESSION: New increased up take in the ribs bilaterally worrisome for metastatic disease. Stable increased uptake in the right fronto poor parietal bone.  A bilateral rib series is recommended.   CT chest, abdomen and pelvis with contrast 05/31/2016 IMPRESSION: 1. Similar fit appearance of widespread it treated osseous metastatic disease, no  progression. No findings of new extra osseous metastatic disease. 2. Continued skin thickening and edema in the right breast. The edema in the right breast and tracking in the right chest wall is slightly increased compared to prior and the lateral soft tissue density in the right lateral breast is stable. 3. Similar small bilateral pleural effusions with some atelectasis at the right lung base possibly tethered to the parietal pleura. 4. Mild coronary artery atherosclerosis and atherosclerotic calcification of the aortic arch. 5. Airway thickening is present, suggesting bronchitis or reactive airways disease.  ASSESSMENT & PLAN:   Margaret Rubio is a 75 year old  African-American female, who was diagnosed with node positive right breast  Invasive adenocarcinoma in  2009, untreated,  Now presents with low back pain and lumbar spine MRI is highly suspicious for bone metastasis.  1. Right breast IDA,  Strongly ER positive,  PR weakly positive,  HER-2 positive on first biopsy , but negative on second and third  biopsy 3 and 6 years later, with diffuse bone metastatic and right pleura, T4NxM1 stage IV  -I previously discussed her initial PET scan results with her and her daughter. 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 -she knows that her disease at this stage is incurable. The goal of treatment is palliative and to prolong her life. -she is on first line anastrozole, Leslee Home was added on in October 2016 due to slight disease progression. She was not very compliant, off Ibrance for 3-4 months and restarted in 01/2016. -I discussed her restaging CT and bone scan from 05/31/2016, which showed diffuse similar appearing bone lesions, no other new metastasis. Bone scan showed mild bilateral rib uptake, but no corresponding findings on the CT. I think she has stable disease overall. -She is clinically doing well, tolerating Ibrance and anastrozole well, we'll continue.  Patient is more compliant lately, I strongly encouraged her to continue treatment. -Lab results reviewed, she has mild neutropenia, stable anemia, we'll continue monitoring  2. Left orbital lesion  -this was found on her brain MRI in 06/2015 when she had heaache, she has issues with left eye ball movement, was evaluated by Dr. Emilio Math at [facility] -this is likely a metastatic lesion from her breast cancer -her headache has improved  -I spoke with Dr. Emilio Math after clinic, and we agree to repeat facial orbital MRI, and hold on biopsy for now -If the orbital lesion progressed, I may recommend radiation   3. Low back/ hip pain,  Secondary to metastatic cancer -this has resolved since she started anastrozole -continue Xgeva monthly  4. Anemia -Likely secondary to her breast cancer, bone metastases and the B12 deficiency -Slightly improved after B12 injections. Continue B12 monthly for now -No need for transfusion. We'll follow up closely.  Plan: -Xgeva and B12 injection today, and every 4 weeks -continue anastrozole and Ibrance -facial orbit MRI  -I'll see her back in 4 weeks with lab and injections.  All questions were answered. The patient knows to call the clinic with any problems, questions or concerns. I spent 20 minutes counseling the patient face to face. The total time spent in the appointment was 25 minutes and more than 50% was on counseling.     Truitt Merle, MD 07/01/2016

## 2016-07-01 NOTE — Patient Instructions (Signed)
Denosumab injection  What is this medicine?  DENOSUMAB (den oh sue mab) slows bone breakdown. Prolia is used to treat osteoporosis in women after menopause and in men. Xgeva is used to prevent bone fractures and other bone problems caused by cancer bone metastases. Xgeva is also used to treat giant cell tumor of the bone.  This medicine may be used for other purposes; ask your health care provider or pharmacist if you have questions.  What should I tell my health care provider before I take this medicine?  They need to know if you have any of these conditions:  -dental disease  -eczema  -infection or history of infections  -kidney disease or on dialysis  -low blood calcium or vitamin D  -malabsorption syndrome  -scheduled to have surgery or tooth extraction  -taking medicine that contains denosumab  -thyroid or parathyroid disease  -an unusual reaction to denosumab, other medicines, foods, dyes, or preservatives  -pregnant or trying to get pregnant  -breast-feeding  How should I use this medicine?  This medicine is for injection under the skin. It is given by a health care professional in a hospital or clinic setting.  If you are getting Prolia, a special MedGuide will be given to you by the pharmacist with each prescription and refill. Be sure to read this information carefully each time.  For Prolia, talk to your pediatrician regarding the use of this medicine in children. Special care may be needed. For Xgeva, talk to your pediatrician regarding the use of this medicine in children. While this drug may be prescribed for children as young as 13 years for selected conditions, precautions do apply.  Overdosage: If you think you have taken too much of this medicine contact a poison control center or emergency room at once.  NOTE: This medicine is only for you. Do not share this medicine with others.  What if I miss a dose?  It is important not to miss your dose. Call your doctor or health care professional if you are  unable to keep an appointment.  What may interact with this medicine?  Do not take this medicine with any of the following medications:  -other medicines containing denosumab  This medicine may also interact with the following medications:  -medicines that suppress the immune system  -medicines that treat cancer  -steroid medicines like prednisone or cortisone  This list may not describe all possible interactions. Give your health care provider a list of all the medicines, herbs, non-prescription drugs, or dietary supplements you use. Also tell them if you smoke, drink alcohol, or use illegal drugs. Some items may interact with your medicine.  What should I watch for while using this medicine?  Visit your doctor or health care professional for regular checks on your progress. Your doctor or health care professional may order blood tests and other tests to see how you are doing.  Call your doctor or health care professional if you get a cold or other infection while receiving this medicine. Do not treat yourself. This medicine may decrease your body's ability to fight infection.  You should make sure you get enough calcium and vitamin D while you are taking this medicine, unless your doctor tells you not to. Discuss the foods you eat and the vitamins you take with your health care professional.  See your dentist regularly. Brush and floss your teeth as directed. Before you have any dental work done, tell your dentist you are receiving this medicine.  Do   not become pregnant while taking this medicine or for 5 months after stopping it. Women should inform their doctor if they wish to become pregnant or think they might be pregnant. There is a potential for serious side effects to an unborn child. Talk to your health care professional or pharmacist for more information.  What side effects may I notice from receiving this medicine?  Side effects that you should report to your doctor or health care professional as soon as  possible:  -allergic reactions like skin rash, itching or hives, swelling of the face, lips, or tongue  -breathing problems  -chest pain  -fast, irregular heartbeat  -feeling faint or lightheaded, falls  -fever, chills, or any other sign of infection  -muscle spasms, tightening, or twitches  -numbness or tingling  -skin blisters or bumps, or is dry, peels, or red  -slow healing or unexplained pain in the mouth or jaw  -unusual bleeding or bruising  Side effects that usually do not require medical attention (Report these to your doctor or health care professional if they continue or are bothersome.):  -muscle pain  -stomach upset, gas  This list may not describe all possible side effects. Call your doctor for medical advice about side effects. You may report side effects to FDA at 1-800-FDA-1088.  Where should I keep my medicine?  This medicine is only given in a clinic, doctor's office, or other health care setting and will not be stored at home.  NOTE: This sheet is a summary. It may not cover all possible information. If you have questions about this medicine, talk to your doctor, pharmacist, or health care provider.      2016, Elsevier/Gold Standard. (2012-03-27 12:37:47)

## 2016-07-01 NOTE — Progress Notes (Signed)
Margaret Rubio  Clinical Social Rubio was referred by patient for assessment of psychosocial needs due to transportation needs.  Clinical Social Worker met with patient and niece at [facility] to offer support and assess for needs. Pt's niece has been assisting with transportation and will be starting a new job. Pt open to [employer] as an option for transportation. CSW completed [employer] application and submitted to [employer]. Pt very appreciative and aware to reach out to CSW as needed.     Clinical Social Rubio interventions:  Resource assistance Margaret Rubio, [place] Worker [place]  [place] Phone: [phone] Fax: [phone]

## 2016-07-02 LAB — CANCER ANTIGEN 27.29: CA 27.29: 348.8 U/mL — ABNORMAL HIGH (ref 0.0–38.6)

## 2016-07-04 ENCOUNTER — Encounter: Payer: Self-pay | Admitting: Hematology

## 2016-07-15 ENCOUNTER — Ambulatory Visit (HOSPITAL_COMMUNITY): Admission: RE | Admit: 2016-07-15 | Payer: Medicare PPO | Source: Ambulatory Visit

## 2016-07-16 ENCOUNTER — Ambulatory Visit (HOSPITAL_COMMUNITY): Admission: RE | Admit: 2016-07-16 | Payer: Medicare PPO | Source: Ambulatory Visit

## 2016-07-19 MED FILL — ANASTROZOLE 1 MG TABLET: 1 | 30 days supply | Qty: 30 | Fill #3

## 2016-07-19 MED FILL — *IBRANCE 125 MG CAPSULE: 125 | 28 days supply | Qty: 21 | Fill #0

## 2016-07-22 ENCOUNTER — Ambulatory Visit (HOSPITAL_COMMUNITY)
Admission: RE | Admit: 2016-07-22 | Discharge: 2016-07-22 | Disposition: A | Discharge status: Home / Self Care | Payer: Medicare PPO | Source: Ambulatory Visit | Attending: Hematology | Admitting: Hematology

## 2016-07-22 DIAGNOSIS — C7951 Secondary malignant neoplasm of bone: Secondary | ICD-10-CM | POA: Diagnosis not present

## 2016-07-22 DIAGNOSIS — C50511 Malignant neoplasm of lower-outer quadrant of right female breast: Secondary | ICD-10-CM | POA: Diagnosis not present

## 2016-07-22 MED ORDER — GADOBENATE DIMEGLUMINE 529 MG/ML IV SOLN
18.0000 mL | Freq: Once | INTRAVENOUS | Status: AC | PRN
  Administered 2016-07-22: 18 mL via INTRAVENOUS

## 2016-07-27 ENCOUNTER — Ambulatory Visit
Admission: RE | Admit: 2016-07-27 | Discharge: 2016-07-27 | Disposition: A | Discharge status: Home / Self Care | Payer: Medicare PPO | Source: Ambulatory Visit | Attending: Radiation Oncology | Admitting: Radiation Oncology

## 2016-07-27 ENCOUNTER — Telehealth: Payer: Self-pay | Admitting: Medical Oncology

## 2016-07-27 DIAGNOSIS — Z79899 Other long term (current) drug therapy: Secondary | ICD-10-CM | POA: Insufficient documentation

## 2016-07-27 DIAGNOSIS — C7951 Secondary malignant neoplasm of bone: Secondary | ICD-10-CM | POA: Insufficient documentation

## 2016-07-27 DIAGNOSIS — C7952 Secondary malignant neoplasm of bone marrow: Secondary | ICD-10-CM | POA: Diagnosis not present

## 2016-07-27 DIAGNOSIS — Z510 Encounter for antineoplastic radiation therapy: Secondary | ICD-10-CM | POA: Insufficient documentation

## 2016-07-27 NOTE — Telephone Encounter (Signed)
Needs justification thatt pt will undergo hardship if utilities turned off. She may be  eligible for funds to help her with utilitiy bills based on health .  Needs access to power for heat and phone due to her high  risk for infection with suppressed immune system from ibrance , age  and exposure to cold or inability to call for help.

## 2016-07-30 ENCOUNTER — Ambulatory Visit (HOSPITAL_COMMUNITY)
Admission: RE | Admit: 2016-07-30 | Discharge: 2016-07-30 | Disposition: A | Discharge status: Home / Self Care | Payer: Medicare PPO | Source: Ambulatory Visit | Attending: Nurse Practitioner | Admitting: Nurse Practitioner

## 2016-07-30 ENCOUNTER — Other Ambulatory Visit: Payer: Self-pay | Admitting: Nurse Practitioner

## 2016-07-30 ENCOUNTER — Encounter: Payer: Self-pay | Admitting: Nurse Practitioner

## 2016-07-30 ENCOUNTER — Telehealth: Payer: Self-pay | Admitting: *Deleted

## 2016-07-30 ENCOUNTER — Ambulatory Visit (HOSPITAL_BASED_OUTPATIENT_CLINIC_OR_DEPARTMENT_OTHER): Payer: Medicare PPO | Admitting: Nurse Practitioner

## 2016-07-30 VITALS — BP 153/67 | HR 66 | Temp 98.4°F | Resp 17 | Ht 65.0 in | Wt 206.3 lb

## 2016-07-30 DIAGNOSIS — C50511 Malignant neoplasm of lower-outer quadrant of right female breast: Secondary | ICD-10-CM

## 2016-07-30 DIAGNOSIS — C7951 Secondary malignant neoplasm of bone: Secondary | ICD-10-CM | POA: Diagnosis not present

## 2016-07-30 DIAGNOSIS — M25561 Pain in right knee: Secondary | ICD-10-CM

## 2016-07-30 DIAGNOSIS — J90 Pleural effusion, not elsewhere classified: Secondary | ICD-10-CM | POA: Diagnosis not present

## 2016-07-30 DIAGNOSIS — Z170 Estrogen receptor positive status [ER+]: Principal | ICD-10-CM

## 2016-07-30 DIAGNOSIS — R600 Localized edema: Secondary | ICD-10-CM | POA: Insufficient documentation

## 2016-07-30 DIAGNOSIS — R609 Edema, unspecified: Secondary | ICD-10-CM

## 2016-07-30 DIAGNOSIS — R21 Rash and other nonspecific skin eruption: Secondary | ICD-10-CM

## 2016-07-30 DIAGNOSIS — M79604 Pain in right leg: Secondary | ICD-10-CM | POA: Insufficient documentation

## 2016-07-30 MED ORDER — TRAMADOL HCL 50 MG PO TABS: 50.0000 mg | ORAL_TABLET | Freq: Four times a day (QID) | ORAL | 0 refills | Status: DC | PRN

## 2016-07-30 MED ORDER — CLOTRIMAZOLE 2 % VA CREA: TOPICAL_CREAM | VAGINAL | 1 refills | Status: AC

## 2016-07-30 MED FILL — SM 3-DAY VAGINAL CREAM: 2 % | 3 days supply | Qty: 21 | Fill #0

## 2016-07-30 MED FILL — traMADol HCL 50 MG TABS: 50 | 7 days supply | Qty: 30 | Fill #0

## 2016-07-30 NOTE — Assessment & Plan Note (Signed)
Patient states that she has been experiencing some increased right knee pain for the past several days.  She states that the pain is to the anterior right knee; as well as to the back of her right knee.  She also has some tenderness to the right calf region as well.  She typically has some mild/trace peripheral edema to bilateral lower extremities; but on exam it does appear that the right foot has a mildly increased amount of edema versus the left.  Patient denies any known injury or trauma to the knee.  Doppler ultrasound of the right lower extremity today was negative for DVT.  Dr. Burr Medico in to examine patient as well; and recommended that patient try tramadol prescription.  See if this helps with her knee pain.  Also, patient was advised to call/return or go directly to the emergency department for any worsening symptoms whatsoever.  Also, may need to consider an orthopedic referral if needed.  Pain continues.

## 2016-07-30 NOTE — Telephone Encounter (Signed)
Call from pt requesting to be seen today. She reports pain in and behind her knee. She is unsure if it is swollen but it is painful to walk or bend knee.  Pt also reports rash under her arm. She does not feel comfortable waiting until next office visit.

## 2016-07-30 NOTE — Progress Notes (Signed)
SYMPTOM MANAGEMENT CLINIC    Chief Complaint: Knee pain, rash  HPI:  Margaret Rubio 75 y.o. female diagnosed with breast cancer.  Currently undergoing Ibrance oral therapy and anastrozole.  Also receives Xgeva injections as well.      Breast cancer of lower-outer quadrant of right female breast ([place])   09/03/2008 Initial Diagnosis    right breast cancer, T2N1Mo, stage IIA, ER+, PR+, HER2+ (ration 2.07). Pt declined surgery or other treatment.        05/20/2011 Tumor Marker    right breast mass biopsy showed IDA, ER+/PR+/HER2- (ratio 1.37)      09/04/2011 Cancer Staging    PET scan showed no distant mets      10/08/2014 Progression     lumbar spine MRI showed multiple bone metastasis , highly suspicious for  metastatic disease.      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 Increased size of right pleural effusion, indeterminate for malignant involvement of the pleural fluids.      01/20/2016 Imaging    CT CAP with contrast showed small bilateral pleural effusion, right greater than left, similar osseous metastasis, no evidence of extraosseous metastasis. Bone scan showed no areas of suspicious uptake.       Review of Systems  Cardiovascular: Positive for leg swelling.  Musculoskeletal:       Right knee pain  Skin: Positive for itching and rash.  All other systems reviewed and are negative.   Past Medical History:  Diagnosis Date  . 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 ASTHMA 01/31/2008  . BREAST CANCER, HX OF 11/14/2009  . BRONCHITIS, ACUTE  01/31/2008  . Cough 10/17/2009  . FATIGUE 11/14/2009  . GANGLION CYST 05/01/2009  . HYPERLIPIDEMIA 05/21/2007  . HYPERTENSION 05/05/2007  . LOW BACK PAIN 05/21/2007  . MENOPAUSAL DISORDER 05/22/2010  . Pain in joint, lower leg 05/10/2008  . RASH-NONVESICULAR 05/01/2009  . rt breast ca dx'd 2009   right breast  . SHOULDER PAIN, LEFT 11/14/2009  . 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 VITAMIN B12 DEFICIENCY 05/23/2010    Past Surgical History:  Procedure Laterality Date  . MYOMECTOMY    . removal of neck lump     benigh 1986    has Vitamin B12 deficiency; HYPERLIPIDEMIA; Iron deficiency anemia; Essential hypertension; ASTHMA; MENOPAUSAL DISORDER; SHOULDER PAIN, LEFT; Pain in joint, lower leg; LOW BACK PAIN; GANGLION CYST; FATIGUE; Rash; Cough; Breast cancer of lower-outer quadrant of right female breast ([place]); Preventative health care; Right knee pain; Unspecified vitamin D deficiency; Primary localized osteoarthrosis, lower leg; Vitamin D deficiency; Left lumbar radiculopathy; Broken tooth; Encounter for chemotherapy management; Anemia in neoplastic disease; Pleural effusion, right; Breast cancer metastasized to bone [facility]); Acute URI; Peripheral edema; Left eye pain; Left-sided face pain; and Metastatic breast cancer ([place]) on her problem list.    is allergic to tomato.    Medication List       Accurate as of 07/30/16  6:41 PM. Always use your most recent med list.  anastrozole 1 MG tablet Commonly known as:  ARIMIDEX Take 1 tablet (1 mg total) by mouth daily.   calcium carbonate 1250 MG capsule Take 1,250 mg by mouth 2 (two) times daily with a meal. Not sure of type or dose   clotrimazole 2 % vaginal cream Commonly known as:  GYNE-LOTRIMIN 3 Apply to affected bilaterall axilla sites as needed for rash.   ergocalciferol 50000 units capsule Commonly known as:  VITAMIN D2 Take 1 capsule (50,000 Units total) by mouth once a week.   palbociclib 125 MG capsule Commonly known as:   IBRANCE Take 1 capsule (125 mg total) by mouth daily with breakfast. Take whole with food. Take for 21 days. Repeat every 28 days   traMADol 50 MG tablet Commonly known as:  ULTRAM Take 1 tablet (50 mg total) by mouth every 6 (six) hours as needed.   Vitamin C 100 MG Chew Chew 1 tablet (100 mg total) by mouth every morning.        PHYSICAL EXAMINATION  Oncology Vitals 07/30/2016 07/01/2016  Height 165 cm 165 cm  Weight 93.577 kg 90.084 kg  Weight (lbs) 206 lbs 5 oz 198 lbs 10 oz  BMI (kg/m2) 34.33 kg/m2 33.05 kg/m2  Temp 98.4 98.4  Pulse 66 86  Resp 17 18  SpO2 100 99  BSA (m2) 2.07 m2 2.03 m2   BP Readings from Last 2 Encounters:  07/30/16 (!) 153/67  07/01/16 139/78    Physical Exam  Constitutional: She is oriented to person, place, and time and well-developed, well-nourished, and in no distress.  HENT:  Head: Normocephalic and atraumatic.  Eyes: Pupils are equal, round, and reactive to light.  Diminished movement of left eyeball; secondary to orbital metastasis.  Dr. Burr Medico .  Reviewed orbital MRI in detail with the patient this afternoon.  Neck: Normal range of motion. Neck supple.  Pulmonary/Chest: Effort normal. No respiratory distress.  Musculoskeletal: She exhibits edema.  Neurological: She is alert and oriented to person, place, and time.  Ambulates with assistance of a cane as baseline.  Skin: Skin is warm and dry. Rash noted.  Candida rash to bilateral axillary region; with the right greater than the left.  Psychiatric: Affect normal.  Nursing note and vitals reviewed.   LABORATORY DATA:. No visits with results within 3 Day(s) from this visit.  Latest known visit with results is:  Appointment on 07/01/2016  Component Date Value Ref Range Status  . CA 27.29 07/02/2016 348.8* 0.0 - 38.6 U/mL Final  . WBC 07/01/2016 3.6* 3.9 - 10.3 10e3/uL Final  . NEUT# 07/01/2016 2.6  1.5 - 6.5 10e3/uL Final  . HGB 07/01/2016 9.7* 11.6 - 15.9 g/dL Final  . HCT  07/01/2016 29.4* 34.8 - 46.6 % Final  . Platelets 07/01/2016 228  145 - 400 10e3/uL Final  . MCV 07/01/2016 89.9  79.5 - 101.0 fL Final  . MCH 07/01/2016 29.7  25.1 - 34.0 pg Final  . MCHC 07/01/2016 33.0  31.5 - 36.0 g/dL Final  . RBC 07/01/2016 3.27* 3.70 - 5.45 10e6/uL Final  . RDW 07/01/2016 14.5  11.2 - 14.5 % Final  . lymph# 07/01/2016 0.9  0.9 - 3.3 10e3/uL Final  . MONO# 07/01/2016 0.1  0.1 - 0.9 10e3/uL Final  . Eosinophils Absolute 07/01/2016 0.1  0.0 - 0.5 10e3/uL Final  . Basophils Absolute 07/01/2016 0.0  0.0 - 0.1 10e3/uL Final  . NEUT% 07/01/2016 71.2  38.4 - 76.8 % Final  . LYMPH% 07/01/2016 23.5  14.0 -  49.7 % Final  . MONO% 07/01/2016 3.3  0.0 - 14.0 % Final  . EOS% 07/01/2016 1.4  0.0 - 7.0 % Final  . BASO% 07/01/2016 0.6  0.0 - 2.0 % Final  . Sodium 07/01/2016 142  136 - 145 mEq/L Final  . Potassium 07/01/2016 3.8  3.5 - 5.1 mEq/L Final  . Chloride 07/01/2016 108  98 - 109 mEq/L Final  . CO2 07/01/2016 26  22 - 29 mEq/L Final  . Glucose 07/01/2016 129  70 - 140 mg/dl Final  . BUN 71/84/1085 13.3  7.0 - 26.0 mg/dL Final  . Creatinine 79/04/9309 0.9  0.6 - 1.1 mg/dL Final  . Total Bilirubin 07/01/2016 0.41  0.20 - 1.20 mg/dL Final  . Alkaline Phosphatase 07/01/2016 57  40 - 150 U/L Final  . AST 07/01/2016 21  5 - 34 U/L Final  . ALT 07/01/2016 13  0 - 55 U/L Final  . Total Protein 07/01/2016 6.9  6.4 - 8.3 g/dL Final  . Albumin 91/45/6027 3.5  3.5 - 5.0 g/dL Final  . Calcium 82/96/0390 9.3  8.4 - 10.4 mg/dL Final  . Anion Gap 56/46/9806 8  3 - 11 mEq/L Final  . EGFR 07/01/2016 70* >90 ml/min/1.73 m2 Final    RADIOGRAPHIC STUDIES: No results found.  ASSESSMENT/PLAN:    Right knee pain Patient states that she has been experiencing some increased right knee pain for the past several days.  She states that the pain is to the anterior right knee; as well as to the back of her right knee.  She also has some tenderness to the right calf region as well.  She typically  has some mild/trace peripheral edema to bilateral lower extremities; but on exam it does appear that the right foot has a mildly increased amount of edema versus the left.  Patient denies any known injury or trauma to the knee.  Doppler ultrasound of the right lower extremity today was negative for DVT.  Dr. Mosetta Putt in to examine patient as well; and recommended that patient try tramadol prescription.  See if this helps with her knee pain.  Also, patient was advised to call/return or go directly to the emergency department for any worsening symptoms whatsoever.  Also, may need to consider an orthopedic referral if needed.  Pain continues.  Rash Patient presented with a complaint of rash with some itching to her bilateral axilla regions.  On exam.  It does appear the patient has a fungal rash to her axilla regions 2; with the right slightly greater than the left.  Prescribed clotrimazole 2% cream for the patient to apply to the rash.  Patient was advised to hold using deodorants for the time being as well.  Also, patient was advised to call/return if symptoms do not resolve.  Peripheral edema Patient has history of mild, chronic bilateral lower extremity peripheral edema.  It does appear that there is trace increased edema to the right versus the left foot today.  We'll continue to monitor closely.  There was no evidence of DVT to the right lower extremity on Doppler today.  Breast cancer of lower-outer quadrant of right female breast [facility]) Patient continues to take Ibrance oral therapy; as well as anastrozole oral therapy.  She received Xgeva on a monthly basis as well.  Patient is scheduled to return on 08/03/2016 for labs, visit, and her next Xgeva injection.    Patient stated understanding of all instructions; and was in agreement with this plan of care.  The patient knows to call the clinic with any problems, questions or concerns.   Total time spent with patient was 40 minutes;  with greater  than 75 percent of that time spent in face to face counseling regarding patient's symptoms,  and coordination of care and follow up.  Disclaimer:This dictation was prepared with Dragon/digital dictation along with [employer]. Any transcriptional errors that result from this process are unintentional.  Margaret Second, NP 07/30/2016   Addendum I have seen the patient, examined her. I agree with the assessment and and plan and have edited the notes.   Her right LE leg pain is possibly related to her arthralgia, Doppler was negative for DVT.  I also reviewed her recent orbits MRI findings, and will present her case in CNS tumor board next week. I recommend her to consider palliative radiation to left orbit. She will think about it and discuss with her daughter, she appears to be reluctant to do it.   Truitt Merle  07/30/2016

## 2016-07-30 NOTE — Progress Notes (Signed)
VASCULAR LAB PRELIMINARY  PRELIMINARY  PRELIMINARY  PRELIMINARY  Right lower extremity venous duplex completed.    Preliminary report:  Right:  No evidence of DVT, superficial thrombosis, or Baker's cyst.  Winston Misner, RVS 07/30/2016, 3:58 PM

## 2016-07-30 NOTE — Assessment & Plan Note (Signed)
Patient has history of mild, chronic bilateral lower extremity peripheral edema.  It does appear that there is trace increased edema to the right versus the left foot today.  We'll continue to monitor closely.  There was no evidence of DVT to the right lower extremity on Doppler today.

## 2016-07-30 NOTE — Telephone Encounter (Signed)
Pt called back to follow up on appt. Discussed with Dr. Burr Medico, order received to schedule for [facility] today. Pt can be here by 2PM. She will take the bus.

## 2016-07-30 NOTE — Assessment & Plan Note (Signed)
Patient continues to take Ibrance oral therapy; as well as anastrozole oral therapy.  She received Xgeva on a monthly basis as well.  Patient is scheduled to return on 08/03/2016 for labs, visit, and her next Xgeva injection.

## 2016-07-30 NOTE — Assessment & Plan Note (Signed)
Patient presented with a complaint of rash with some itching to her bilateral axilla regions.  On exam.  It does appear the patient has a fungal rash to her axilla regions 2; with the right slightly greater than the left.  Prescribed clotrimazole 2% cream for the patient to apply to the rash.  Patient was advised to hold using deodorants for the time being as well.  Also, patient was advised to call/return if symptoms do not resolve.

## 2016-08-03 ENCOUNTER — Other Ambulatory Visit (HOSPITAL_BASED_OUTPATIENT_CLINIC_OR_DEPARTMENT_OTHER): Payer: Medicare PPO

## 2016-08-03 ENCOUNTER — Telehealth: Payer: Self-pay | Admitting: Hematology

## 2016-08-03 ENCOUNTER — Encounter: Payer: Self-pay | Admitting: Hematology

## 2016-08-03 ENCOUNTER — Ambulatory Visit (HOSPITAL_BASED_OUTPATIENT_CLINIC_OR_DEPARTMENT_OTHER): Payer: Medicare PPO

## 2016-08-03 ENCOUNTER — Ambulatory Visit (HOSPITAL_BASED_OUTPATIENT_CLINIC_OR_DEPARTMENT_OTHER): Payer: Medicare PPO | Admitting: Hematology

## 2016-08-03 VITALS — BP 146/67 | HR 68 | Temp 98.5°F | Resp 17 | Ht 65.0 in | Wt 203.2 lb

## 2016-08-03 DIAGNOSIS — E538 Deficiency of other specified B group vitamins: Secondary | ICD-10-CM

## 2016-08-03 DIAGNOSIS — C7951 Secondary malignant neoplasm of bone: Secondary | ICD-10-CM | POA: Diagnosis not present

## 2016-08-03 DIAGNOSIS — J90 Pleural effusion, not elsewhere classified: Secondary | ICD-10-CM

## 2016-08-03 DIAGNOSIS — D519 Vitamin B12 deficiency anemia, unspecified: Secondary | ICD-10-CM

## 2016-08-03 DIAGNOSIS — Z170 Estrogen receptor positive status [ER+]: Secondary | ICD-10-CM | POA: Diagnosis not present

## 2016-08-03 DIAGNOSIS — G893 Neoplasm related pain (acute) (chronic): Secondary | ICD-10-CM

## 2016-08-03 DIAGNOSIS — I10 Essential (primary) hypertension: Secondary | ICD-10-CM

## 2016-08-03 DIAGNOSIS — C50511 Malignant neoplasm of lower-outer quadrant of right female breast: Secondary | ICD-10-CM

## 2016-08-03 DIAGNOSIS — D630 Anemia in neoplastic disease: Secondary | ICD-10-CM

## 2016-08-03 DIAGNOSIS — C50911 Malignant neoplasm of unspecified site of right female breast: Secondary | ICD-10-CM

## 2016-08-03 DIAGNOSIS — D701 Agranulocytosis secondary to cancer chemotherapy: Secondary | ICD-10-CM

## 2016-08-03 LAB — CBC WITH DIFFERENTIAL/PLATELET
BASO%: 0.7 % (ref 0.0–2.0)
Basophils Absolute: 0 10*3/uL (ref 0.0–0.1)
EOS ABS: 0 10*3/uL (ref 0.0–0.5)
EOS%: 0.6 % (ref 0.0–7.0)
HEMATOCRIT: 32.2 % — AB (ref 34.8–46.6)
HGB: 10.4 g/dL — ABNORMAL LOW (ref 11.6–15.9)
LYMPH#: 0.9 10*3/uL (ref 0.9–3.3)
LYMPH%: 28.8 % (ref 14.0–49.7)
MCH: 30.3 pg (ref 25.1–34.0)
MCHC: 32.3 g/dL (ref 31.5–36.0)
MCV: 93.6 fL (ref 79.5–101.0)
MONO#: 0.4 10*3/uL (ref 0.1–0.9)
MONO%: 11 % (ref 0.0–14.0)
NEUT%: 58.9 % (ref 38.4–76.8)
NEUTROS ABS: 1.9 10*3/uL (ref 1.5–6.5)
PLATELETS: 274 10*3/uL (ref 145–400)
RBC: 3.44 10*6/uL — AB (ref 3.70–5.45)
RDW: 15.3 % — ABNORMAL HIGH (ref 11.2–14.5)
WBC: 3.3 10*3/uL — AB (ref 3.9–10.3)

## 2016-08-03 LAB — COMPREHENSIVE METABOLIC PANEL
ALBUMIN: 3.5 g/dL (ref 3.5–5.0)
ALK PHOS: 65 U/L (ref 40–150)
ALT: 17 U/L (ref 0–55)
ANION GAP: 7 meq/L (ref 3–11)
AST: 30 U/L (ref 5–34)
BILIRUBIN TOTAL: 0.3 mg/dL (ref 0.20–1.20)
BUN: 9.9 mg/dL (ref 7.0–26.0)
CALCIUM: 9.3 mg/dL (ref 8.4–10.4)
CO2: 27 mEq/L (ref 22–29)
CREATININE: 0.7 mg/dL (ref 0.6–1.1)
Chloride: 107 mEq/L (ref 98–109)
Glucose: 83 mg/dl (ref 70–140)
Potassium: 4.1 mEq/L (ref 3.5–5.1)
Sodium: 140 mEq/L (ref 136–145)
TOTAL PROTEIN: 6.9 g/dL (ref 6.4–8.3)

## 2016-08-03 MED ORDER — DENOSUMAB 120 MG/1.7ML ~~LOC~~ SOLN
120.0000 mg | Freq: Once | SUBCUTANEOUS | Status: AC
  Administered 2016-08-03: 120 mg via SUBCUTANEOUS
  Filled 2016-08-03: qty 1.7

## 2016-08-03 MED ORDER — CYANOCOBALAMIN 1000 MCG/ML IJ SOLN
1000.0000 ug | Freq: Once | INTRAMUSCULAR | Status: AC
  Administered 2016-08-03: 1000 ug via INTRAMUSCULAR

## 2016-08-03 NOTE — Patient Instructions (Signed)
Take Calcium with Vitamin D supplements two times daily.  Denosumab injection What is this medicine? DENOSUMAB (den oh sue mab) slows bone breakdown. Prolia is used to treat osteoporosis in women after menopause and in men. Delton See is used to prevent bone fractures and other bone problems caused by cancer bone metastases. Delton See is also used to treat giant cell tumor of the bone. This medicine may be used for other purposes; ask your health care provider or pharmacist if you have questions. What should I tell my health care provider before I take this medicine? They need to know if you have any of these conditions: -dental disease -eczema -infection or history of infections -kidney disease or on dialysis -low blood calcium or vitamin D -malabsorption syndrome -scheduled to have surgery or tooth extraction -taking medicine that contains denosumab -thyroid or parathyroid disease -an unusual reaction to denosumab, other medicines, foods, dyes, or preservatives -pregnant or trying to get pregnant -breast-feeding How should I use this medicine? This medicine is for injection under the skin. It is given by a health care professional in a hospital or clinic setting. If you are getting Prolia, a special MedGuide will be given to you by the pharmacist with each prescription and refill. Be sure to read this information carefully each time. For Prolia, talk to your pediatrician regarding the use of this medicine in children. Special care may be needed. For Delton See, talk to your pediatrician regarding the use of this medicine in children. While this drug may be prescribed for children as young as 13 years for selected conditions, precautions do apply. Overdosage: If you think you have taken too much of this medicine contact a poison control center or emergency room at once. NOTE: This medicine is only for you. Do not share this medicine with others. What if I miss a dose? It is important not to miss your  dose. Call your doctor or health care professional if you are unable to keep an appointment. What may interact with this medicine? Do not take this medicine with any of the following medications: -other medicines containing denosumab This medicine may also interact with the following medications: -medicines that suppress the immune system -medicines that treat cancer -steroid medicines like prednisone or cortisone This list may not describe all possible interactions. Give your health care provider a list of all the medicines, herbs, non-prescription drugs, or dietary supplements you use. Also tell them if you smoke, drink alcohol, or use illegal drugs. Some items may interact with your medicine. What should I watch for while using this medicine? Visit your doctor or health care professional for regular checks on your progress. Your doctor or health care professional may order blood tests and other tests to see how you are doing. Call your doctor or health care professional if you get a cold or other infection while receiving this medicine. Do not treat yourself. This medicine may decrease your body's ability to fight infection. You should make sure you get enough calcium and vitamin D while you are taking this medicine, unless your doctor tells you not to. Discuss the foods you eat and the vitamins you take with your health care professional. See your dentist regularly. Brush and floss your teeth as directed. Before you have any dental work done, tell your dentist you are receiving this medicine. Do not become pregnant while taking this medicine or for 5 months after stopping it. Women should inform their doctor if they wish to become pregnant or think they might  be pregnant. There is a potential for serious side effects to an unborn child. Talk to your health care professional or pharmacist for more information. What side effects may I notice from receiving this medicine? Side effects that you  should report to your doctor or health care professional as soon as possible: -allergic reactions like skin rash, itching or hives, swelling of the face, lips, or tongue -breathing problems -chest pain -fast, irregular heartbeat -feeling faint or lightheaded, falls -fever, chills, or any other sign of infection -muscle spasms, tightening, or twitches -numbness or tingling -skin blisters or bumps, or is dry, peels, or red -slow healing or unexplained pain in the mouth or jaw -unusual bleeding or bruising Side effects that usually do not require medical attention (Report these to your doctor or health care professional if they continue or are bothersome.): -muscle pain -stomach upset, gas This list may not describe all possible side effects. Call your doctor for medical advice about side effects. You may report side effects to FDA at 1-800-FDA-1088. Where should I keep my medicine? This medicine is only given in a clinic, doctor's office, or other health care setting and will not be stored at home. NOTE: This sheet is a summary. It may not cover all possible information. If you have questions about this medicine, talk to your doctor, pharmacist, or health care provider.    2016, [employer]. (2012-03-27 12:37:47) Cyanocobalamin, Vitamin B12 injection What is this medicine? CYANOCOBALAMIN (sye an oh koe BAL a min) is a man made form of vitamin B12. Vitamin B12 is used in the growth of healthy blood cells, nerve cells, and proteins in the body. It also helps with the metabolism of fats and carbohydrates. This medicine is used to treat people who can not absorb vitamin B12. This medicine may be used for other purposes; ask your health care provider or pharmacist if you have questions. COMMON BRAND NAME(S): Cyomin, LA-12, Nutri-Twelve, Primabalt What should I tell my health care provider before I take this medicine? They need to know if you have any of these conditions: -kidney  disease -Leber's disease -megaloblastic anemia -an unusual or allergic reaction to cyanocobalamin, cobalt, other medicines, foods, dyes, or preservatives -pregnant or trying to get pregnant -breast-feeding How should I use this medicine? This medicine is injected into a muscle or deeply under the skin. It is usually given by a health care professional in a clinic or doctor's office. However, your doctor may teach you how to inject yourself. Follow all instructions. Talk to your pediatrician regarding the use of this medicine in children. Special care may be needed. Overdosage: If you think you have taken too much of this medicine contact a poison control center or emergency room at once. NOTE: This medicine is only for you. Do not share this medicine with others. What if I miss a dose? If you are given your dose at a clinic or doctor's office, call to reschedule your appointment. If you give your own injections and you miss a dose, take it as soon as you can. If it is almost time for your next dose, take only that dose. Do not take double or extra doses. What may interact with this medicine? -colchicine -heavy alcohol intake This list may not describe all possible interactions. Give your health care provider a list of all the medicines, herbs, non-prescription drugs, or dietary supplements you use. Also tell them if you smoke, drink alcohol, or use illegal drugs. Some items may interact with your medicine.  What should I watch for while using this medicine? Visit your doctor or health care professional regularly. You may need blood work done while you are taking this medicine. You may need to follow a special diet. Talk to your doctor. Limit your alcohol intake and avoid smoking to get the best benefit. What side effects may I notice from receiving this medicine? Side effects that you should report to your doctor or health care professional as soon as possible: -allergic reactions like skin  rash, itching or hives, swelling of the face, lips, or tongue -blue tint to skin -chest tightness, pain -difficulty breathing, wheezing -dizziness -red, swollen painful area on the leg Side effects that usually do not require medical attention (report to your doctor or health care professional if they continue or are bothersome): -diarrhea -headache This list may not describe all possible side effects. Call your doctor for medical advice about side effects. You may report side effects to FDA at 1-800-FDA-1088. Where should I keep my medicine? Keep out of the reach of children. Store at room temperature between 15 and 30 degrees C (59 and 85 degrees F). Protect from light. Throw away any unused medicine after the expiration date. NOTE: This sheet is a summary. It may not cover all possible information. If you have questions about this medicine, talk to your doctor, pharmacist, or health care provider.  2015, [employer] Standard. (2008-01-08 22:10:20)

## 2016-08-03 NOTE — Progress Notes (Addendum)
[place]  Telephone:([phone]) [phone] [phone]  Clinic follow up Note   Patient Care Team: [name], MD as PCP - General Truitt Merle, MD as Consulting Physician (Hematology)  08/03/2016  CHIEF COMPLAINTS Follow up breast cancer     Breast cancer of lower-outer quadrant of right female breast ([place])   09/03/2008 Initial Diagnosis    right breast cancer, T2N1Mo, stage IIA, ER+, PR+, HER2+ (ration 2.07). Pt declined surgery or other treatment.        05/20/2011 Tumor Marker    right breast mass biopsy showed IDA, ER+/PR+/HER2- (ratio 1.37)      09/04/2011 Cancer Staging    PET scan showed no distant mets      10/08/2014 Progression     lumbar spine MRI showed multiple bone metastasis , highly suspicious for  metastatic disease.      12/13/2014 Pathology Results    Right breast needle biopsy showed invasive ductal carcinoma, ER 100% positive, PR 2% positive, HER-2 negative with ratio 1.45 and copy number 2.25      12/14/2014 -  Anti-estrogen oral therapy    Arimidex 98m daily, Ibrance added on 07/29/2015, stopped in 08/2015 after loss of f/u, and restarted on 01/10/2016      07/17/2015 Imaging    PET scan showed overall similar appearance of multifocal right breast cancer with extensive bone metastasis. Increased size of right pleural effusion, indeterminate for malignant involvement of the pleural fluids.      01/20/2016 Imaging    CT CAP with contrast showed small bilateral pleural effusion, right greater than left, similar osseous metastasis, no evidence of extraosseous metastasis. Bone scan showed no areas of suspicious uptake.        HISTORY OF PRESENTING ILLNESS:  MYOLTZIN RANSOM75y.o. female  Was referred by her primary care physician to discuss the management probable metastatic breast cancer.  She was initially diagnosed with right breast cancer in November 2009. She underwent a right breast biopsy of a lesion in the 6:00 position that  showed a carcinoma in situ associated with microscopic foci of microinvasion. In December of 2009, she underwent a right axillary lymph node biopsy on 09/24/2008 which showed metastatic ductal carcinoma. (Case No: [id number] The tumor was ER 100% positive, PR 0% negative, Ki67 - 22%. 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 The ratio of HER-2:CEP 17 was 2.07.  She decided to pursue nature therapy by diet and exercise, and did not have surgery and other tranditional therapy for her breast cancer.   In May 2011 she underwent a mammogram that showed a 3.8 cm invasive duct carcinoma in the subareolar region of the right breast which had increased since November 2009.  The tumor was ER  99% positive,  PR 4% positive, HER-2 negative ( ratio 1.37).  She was seen by Dr. EEstella Huskand Dr. NLucia Gaskins  However, [place] failed multiple appointments [place] putting something on her right breast that caused some scarring.  She noticed the enlarged right breast mass and nipple inversion about one year ago. No pain, but some skin change. She developed left low back/buttock and hip and  pain in Nov 2015, was seen by PCP Dr. JJenny Reichmannwho prescribed vicodin and tramadol and pain improved with meds. Due to the persistent pain, she underwent a lumber MRI  On 10/08/2014, which showed multiple bone metastasis.  She run out pain medication about a week ago,  But did not call  for refill. She states her pain is not bad , but she appears to be uncomfortable when she stands up and walks.  She otherwise feels well overall,  Has good appetite and energy level. She lives alone and still functions well.  She comes in with her niece today.   Current therapy:  1.Anastrozole 1 mg once daily started on 12/10/2014 2. Ibrance added on 07/29/15, stopped in Nov 2016, restarted on 4/1  3. Xgeva started on 08/15/2015  INTERIM HISTORY: Ms. Danish returns for follow-up. She was seen by our  symptom management clinic last week for her knee pain and bilateral axilla fungal infection. Her symptom has much improved, she is able to ambulate with a cane without much difficulty. Skin rash on her axilla has resolved. She feels well overall. No other new complaints.  MEDICAL HISTORY:  Past Medical History:  Diagnosis Date  . 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 ASTHMA 01/31/2008  . BREAST CANCER, HX OF 11/14/2009  . BRONCHITIS, ACUTE 01/31/2008  . Cough 10/17/2009  . FATIGUE 11/14/2009  . GANGLION CYST 05/01/2009  . HYPERLIPIDEMIA 05/21/2007  . HYPERTENSION 05/05/2007  . LOW BACK PAIN 05/21/2007  . MENOPAUSAL DISORDER 05/22/2010  . Pain in joint, lower leg 05/10/2008  . RASH-NONVESICULAR 05/01/2009  . rt breast ca dx'd 2009   right breast  . SHOULDER PAIN, LEFT 11/14/2009  . 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 VITAMIN B12 DEFICIENCY 05/23/2010    SURGICAL HISTORY: Past Surgical History:  Procedure Laterality Date  . MYOMECTOMY    . removal of neck lump     benigh 1986    SOCIAL HISTORY: History   Social History  . Marital Status: Widowed    Spouse Name: N/A    Number of Children: 48, lives in [place].   . Years of Education: N/A   Occupational History  . Not on file.   Social History Main Topics  . Smoking status: Never Smoker   . Smokeless tobacco: Not on file  . Alcohol Use: No  . Drug Use: No  . Sexual Activity: Not on file   Other Topics Concern  . Not on file   Social History Narrative    FAMILY HISTORY: Family History  Problem Relation Age of Onset  . Hypertension Father   . Heart disease Father   . Asthma Other   . Diabetes Other   . Hypertension Other   Paternal aunt had cancer, unknown type. No other family history of malignancy   ALLERGIES:  is allergic to tomato.  MEDICATIONS:  Current Outpatient Prescriptions on File Prior to Visit  Medication Sig Dispense Refill  . anastrozole (ARIMIDEX) 1 MG tablet Take 1 tablet (1 mg total) by mouth daily. 30 tablet 9    . Ascorbic Acid (VITAMIN C) 100 MG CHEW Chew 1 tablet (100 mg total) by mouth every morning. 90 each 3  . calcium carbonate 1250 MG capsule Take 1,250 mg by mouth 2 (two) times daily with a meal. Not sure of type or dose    . clotrimazole (GYNE-LOTRIMIN 3) 2 % vaginal cream Apply to affected bilaterall axilla sites as needed for rash. 21 g 1  . ergocalciferol (VITAMIN D2) 50000 UNITS capsule Take 1 capsule (50,000 Units total) by mouth once a week. 4 capsule 5  . palbociclib (IBRANCE) 125 MG capsule Take 1 capsule (125 mg total) by mouth daily with breakfast. Take whole with food. Take for 21 days. Repeat every 28 days 21 capsule 0  . traMADol (ULTRAM) 50 MG  tablet Take 1 tablet (50 mg total) by mouth every 6 (six) hours as needed. 30 tablet 0   No current facility-administered medications on file prior to visit.   ; REVIEW OF SYSTEMS:   Constitutional: Denies fevers, chills or abnormal night sweats Eyes: Denies blurriness of vision, double vision or watery eyes Ears, nose, mouth, throat, and face: Denies mucositis or sore throat Respiratory: Denies cough, dyspnea or wheezes Cardiovascular: Denies palpitation, chest discomfort or lower extremity swelling Gastrointestinal:  Denies nausea, heartburn or change in bowel habits Skin: Denies abnormal skin rashes Lymphatics: Denies new lymphadenopathy or easy bruising Neurological:Denies numbness, tingling or new weaknesses Behavioral/Psych: Mood is stable, no new changes  All other systems were reviewed with the patient and are negative.  PHYSICAL EXAMINATION: ECOG PERFORMANCE STATUS: 1 BP (!) 146/67 (BP Location: Left Arm, Patient Position: Sitting)   Pulse 68   Temp 98.5 F (36.9 C) (Oral)   Resp 17   Ht 5' 5" (1.651 m)   Wt 203 lb 3.2 oz (92.2 kg)   SpO2 100%   BMI 33.81 kg/m   GENERAL:alert, no distress and comfortable SKIN: skin color, texture, turgor are normal, no rashes or significant lesions EYES: normal, conjunctiva are pink  and non-injected, sclera clear OROPHARYNX:no exudate, no erythema and lips, buccal mucosa, and tongue normal  NECK: supple, thyroid normal size, non-tender, without nodularity LYMPH:  no palpable lymphadenopathy in the cervical, axillary or inguinal LUNGS: clear to auscultation and percussion with normal breathing effort, slightly decreased breath sounds at the bottom of right lung. HEART: regular rate & rhythm and no murmurs and no lower extremity edema ABDOMEN:abdomen soft, non-tender and normal bowel sounds Musculoskeletal:no cyanosis of digits and no clubbing  PSYCH: alert & oriented x 3 with fluent speech NEURO: no focal motor/sensory deficits. Her left eye ball has limited range of movement  Breasts: Breast inspection showed  Significantly enlarged right breast, compared to left breast, (+) skin pigmentations , nipple inversion and a dry ulcer/ scar below the nipple, no discharge.  The right breast is softer than before, nontender , no discrete mass. Palpation of the  Left breasts and axilla revealed no obvious mass that I could appreciate.   LABORATORY DATA:  I have reviewed the data as listed CBC Latest Ref Rng & Units 08/03/2016 07/01/2016 06/02/2016  WBC 3.9 - 10.3 10e3/uL 3.3(L) 3.6(L) 2.3(L)  Hemoglobin 11.6 - 15.9 g/dL 10.4(L) 9.7(L) 9.2(L)  Hematocrit 34.8 - 46.6 % 32.2(L) 29.4(L) 28.1(L)  Platelets 145 - 400 10e3/uL 274 228 156    CMP Latest Ref Rng & Units 08/03/2016 07/01/2016 06/02/2016  Glucose 70 - 140 mg/dl 83 129 86  BUN 7.0 - 26.0 mg/dL 9.9 13.3 10.5  Creatinine 0.6 - 1.1 mg/dL 0.7 0.9 0.7  Sodium 136 - 145 mEq/L 140 142 136  Potassium 3.5 - 5.1 mEq/L 4.1 3.8 4.1  Chloride 96 - 112 mEq/L - - -  CO2 22 - 29 mEq/L _0 Calcium 8.4 - 10.4 mg/dL 9.3 9.3 8.9  Total Protein 6.4 - 8.3 g/dL 6.9 6.9 6.7  Total Bilirubin 0.20 - 1.20 mg/dL 0.30 0.41 0.41  Alkaline Phos 40 - 150 U/L 65 57 48  AST 5 - 34 U/L _1 ALT 0 - 55 U/L 17 13 <9   CA27.29: 06/30/2012:  28 10/30/2014: 355 08/15/2015: 424 12/27/2014: 414 05/05/2016: 416.1 06/10/2016: 332.4 07/01/2016: 348.8   PATHOLOGY REPORT  RIGHT BREAST MASS, 6 O'CLOCK, NEEDLE CORE BIOPSIES: 09/03/2008 MICROSCOPIC FOCUS OF  DUCTAL CARCINOMA IN SITU ASSOCIATED WITH MICROSCOPIC FOCUS OF MICROINVASION. SEE COMMET.  LYMPH NODE, RIGHT AXILLARY, NEEDLE CORE BIOPSIES: METASTATIC DUCTAL CARCINOMA.  09/24/2008  COMMENT There are needle core biopsies of lymph node showing partial replacement by metastatic adenocarcinoma consistent with metastasis from a breast primary, metastatic ductal carcinoma. Since the breast prognostic profile is not performed on the previous material because of limited invasive tumor ([id number]), the breast prognostic profile will be performed on the tumor in the lymph node and reported in a separate report. Interpretation HER-2/NEU BY CISH -- SHOWS AMPLIFICATION BY CISH ANALYSIS. THE RATIO OF HER-2: CEP 17 SIGNALS WAS 2.07  Diagnosis 05/20/2011 Breast, right, needle core biopsy - INVASIVE DUCTAL CARCINOMA, SEE COMMENT. - LYMPHOVASCULAR INVASION PRESENT. Estrogen Receptor (Negative, <1%): 99%, STRONG STAINING INTENSITY Progesterone Receptor (Negative, <1%): 4%, STRONG HER-2/NEU BY CISH - NO AMPLIFICATION OF HER-2 DETECTED. THE RATIO OF HER-2: CEP 17 SIGNALS WAS 1.38.  Breast, right, needle core biopsy, 6:30 o'clock 12/13/2014 - INVASIVE MAMMARY CARCINOMA. - SEE MICROSCOPIC DESCRIPTION. Microscopic Comment The features favor Grade II invasive ductal carcinoma. A breast prognostic profile will be performed. Dr. Donato Heinz agrees. The results were called to [facility] on 12/16/14. (JDP:ds 12/16/14) ADDENDUM: Immunohistochemistry for E-cadherin is performed and shows diffuse strong positivity consistent with invasive ductal carcinoma. Estrogen Receptor: 100%, POSITIVE, STRONG STAINING INTENSITY Progesterone Receptor: 2%, POSITIVE, STRONG STAINING  INTENSITY Proliferation Marker Ki67: 27% HER-2/NEU BY CISH - NEGATIVE. RESULT RATIO OF HER2: CEP 17 SIGNALS 1.45 AVERAGE HER2 COPY NUMBER PER CELL 2.25  Diagnosis 08/01/2015 PLEURAL FLUID, RIGHT(SPECIMEN 1 OF 1 COLLECTED 08/01/15): METASTATIC CARCINOMA CONSISTENT WITH BREAST PRIMARY.   RADIOGRAPHIC STUDIES: I have personally reviewed the radiological images as listed and agreed with the findings in the report.  PET 07/17/2015 IMPRESSION: 1. Suspected active diverticulitis along the sigmoid colon, with focal mesenteric stranding and a small locule of gas which is either within an inflamed diverticulum or a micro perforation. No overt free air. 2. Overall similar appearance of multifocal right breast cancer with extensive osseous metastatic disease. The right axillary lymph nodes are less notable that there is a borderline hypermetabolic left axillary lymph node currently. 3. Increasing size of the right pleural effusion, now moderate. Appearance indeterminate for malignant involvement of the pleural fluid. 4. Faintly increased activity in a right infrahilar lymph node which could represent early metastatic spread. 5. Other imaging findings of potential clinical significance: Chronic left maxillary sinusitis. Low-density blood pool suggests anemia. Left adrenal adenoma. These results will be called to the ordering clinician or representative by the Radiologist Assistant, and communication documented in the PACS or zVision Dashboard.  Brain MRI with and without contrast on 06/27/2015, at [facility] -In determinate, ill-defined soft tissue infiltration in the left orbit with questionable enhancement, further evaluation with CT orbits with contrast is recommended to the cyst in ruling out metastasis. -No intracranial metastasis -Diffusely hypoechoic intense bone marrow signal throughout the skull base   Bone scan 05/31/2016 IMPRESSION: New  increased up take in the ribs bilaterally worrisome for metastatic disease. Stable increased uptake in the right fronto poor parietal bone.  A bilateral rib series is recommended.   CT chest, abdomen and pelvis with contrast 05/31/2016 IMPRESSION: 1. Similar fit appearance of widespread it treated osseous metastatic disease, no progression. No findings of new extra osseous metastatic disease. 2. Continued skin thickening and edema in the right breast. The edema in the right breast and tracking in the right chest  wall is slightly increased compared to prior and the lateral soft tissue density in the right lateral breast is stable. 3. Similar small bilateral pleural effusions with some atelectasis at the right lung base possibly tethered to the parietal pleura. 4. Mild coronary artery atherosclerosis and atherosclerotic calcification of the aortic arch. 5. Airway thickening is present, suggesting bronchitis or reactive airways disease.  MR orbits w/wo contrast 07/22/2016  IMPRESSION: 1. Diffuse metastasis within the left orbit intraconal space, which likely invades the left extraocular muscles. No abnormal enhancement of the left optic nerve to strongly indicate nerve invasion although there does appear to be abnormal T2 signal within the substance of the L CN2, such as due to neuritis. The left globe appears spared. 2. No other soft tissue metastasis identified, an the right orbit appears normal. Evidence of osseous metastatic disease in the cervical spine and possibly also the calvarium.    ASSESSMENT & PLAN:   Mrs. Sykora is a 75 year old  African-American female, who was diagnosed with node positive right breast  Invasive adenocarcinoma in  2009, untreated,  Now presents with low back pain and lumbar spine MRI is highly suspicious for bone metastasis.  1. Right breast IDA,  Strongly ER positive,  PR weakly positive,  HER-2 positive on first biopsy , but negative on  second and third  biopsy 3 and 6 years later, with diffuse bone metastatic and right pleura, T4NxM1 stage IV  -I previously discussed her initial PET scan results with her and her daughter. 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 -she knows that her disease at this stage is incurable. The goal of treatment is palliative and to prolong her life. -she is on first line anastrozole, Leslee Home was added on in October 2016 due to slight disease progression. She was not very compliant, off Ibrance for 3-4 months and restarted in 01/2016. -I discussed her restaging CT and bone scan from 05/31/2016, which showed diffuse similar appearing bone lesions, no other new metastasis. Bone scan showed mild bilateral rib uptake, but no corresponding findings on the CT. I think she has stable disease overall. -She is clinically doing well, tolerating Ibrance and anastrozole well, we'll continue. Patient is more compliant lately, I strongly encouraged her to continue treatment. -Lab results reviewed, she has mild neutropenia, stable anemia, we'll continue monitoring -plan to repeat staging scan in Dec 2017   2. Left orbital lesion  -this was found on her brain MRI in 06/2015 when she had heaache, she has issues with left eye ball movement, was [facility] Emilio Math [facility] -this is likely a metastatic lesion from her breast cancer -her headache has improved  -I discussed her orbital MRI scan findings, which showed diffuse metastases to status is within the left orbital with the reading the left extraocular muscles. No optical nerve involvement. -I discussed palliative radiation to her left orbital bone mets, she will discuss with her daughter also.  3. Low back/ hip pain,  Secondary to metastatic cancer to bones  -this has resolved since she started anastrozole -continue Xgeva monthly  4. Anemia -Likely secondary to her breast cancer, bone metastases and the B12 deficiency -Slightly improved  after B12 injections. Continue B12 monthly for now -No need for transfusion. We'll follow up closely.  Plan: -Xgeva and B12 injection today, and every 4 weeks -continue anastrozole and Ibrance, she will started next cycle Ibrance on 10/29  -I'll see her back in 4 weeks with lab and injections. -I will  call her daughter to update her   All questions were answered. The patient knows to call the clinic with any problems, questions or concerns. I spent 20 minutes counseling the patient face to face. The total time spent in the appointment was 25 minutes and more than 50% was on counseling.     Truitt Merle, MD 08/03/2016   Addendum  I spoke with pt's daughter, she agrees with rad/onc referral, will arrange.   Truitt Merle  08/04/2016

## 2016-08-03 NOTE — Telephone Encounter (Signed)
Gave patient avs report and appointments for November.  °

## 2016-08-04 ENCOUNTER — Telehealth: Payer: Self-pay | Admitting: *Deleted

## 2016-08-04 LAB — CANCER ANTIGEN 27.29: CAN 27.29: 388.1 U/mL — AB (ref 0.0–38.6)

## 2016-08-04 NOTE — Telephone Encounter (Signed)
Pt. Daughter called @ 845 stating that Dr.Feng calling her on yesterday regarding her mother. She missed the call and was returning her call, she asked for Dr. Burr Medico to please return her call today.

## 2016-08-04 NOTE — Addendum Note (Signed)
Addended by: Truitt Merle on: 08/04/2016 05:32 PM   Modules accepted: Orders

## 2016-08-18 ENCOUNTER — Ambulatory Visit
Admission: RE | Admit: 2016-08-18 | Discharge: 2016-08-18 | Disposition: A | Discharge status: Home / Self Care | Payer: Medicare PPO | Source: Ambulatory Visit | Attending: Radiation Oncology | Admitting: Radiation Oncology

## 2016-08-18 ENCOUNTER — Encounter: Payer: Self-pay | Admitting: Radiation Oncology

## 2016-08-18 VITALS — BP 157/75 | HR 69 | Resp 16 | Ht 65.0 in | Wt 206.0 lb

## 2016-08-18 DIAGNOSIS — Z510 Encounter for antineoplastic radiation therapy: Secondary | ICD-10-CM | POA: Diagnosis not present

## 2016-08-18 DIAGNOSIS — C50911 Malignant neoplasm of unspecified site of right female breast: Secondary | ICD-10-CM

## 2016-08-18 DIAGNOSIS — C7951 Secondary malignant neoplasm of bone: Secondary | ICD-10-CM

## 2016-08-18 DIAGNOSIS — C7952 Secondary malignant neoplasm of bone marrow: Principal | ICD-10-CM

## 2016-08-18 HISTORY — DX: Malignant neoplasm of unspecified site of unspecified female breast: C50.919

## 2016-08-18 NOTE — Progress Notes (Signed)
See progress note under physician encounter. 

## 2016-08-18 NOTE — Progress Notes (Signed)
Histology and Location of Primary Cancer: right breast invasive adenocarcinoma, strongly ER positive, PR positive (weak), and HER 2 positive on the first biopsy but, negative on second and third biopsy  Sites of Visceral and Bony Metastatic Disease: diffuse bony mets  And right pleura  Location(s) of Symptomatic Metastases: left orbit  Past/Anticipated chemotherapy by medical oncology, if any:     09/03/2008 Initial Diagnosis    right breast cancer, T2N1Mo, stage IIA, ER+, PR+, HER2+ (ration 2.07). Pt declined surgery or other treatment.        05/20/2011 Tumor Marker    right breast mass biopsy showed IDA, ER+/PR+/HER2- (ratio 1.37)      09/04/2011 Cancer Staging    PET scan showed no distant mets      10/08/2014 Progression     lumbar spine MRI showed multiple bone metastasis , highly suspicious for  metastatic disease.      12/13/2014 Pathology Results    Right breast needle biopsy showed invasive ductal carcinoma, ER 100% positive, PR 2% positive, HER-2 negative with ratio 1.45 and copy number 2.25      12/14/2014 -  Anti-estrogen oral therapy    Arimidex 74m daily, Ibrance added on 07/29/2015, stopped in 08/2015 after loss of f/u, and restarted on 01/10/2016      07/17/2015 Imaging    PET scan showed overall similar appearance of multifocal right breast cancer with extensive bone metastasis. Increased size of right pleural effusion, indeterminate for malignant involvement of the pleural fluids.      01/20/2016 Imaging    CT CAP with contrast showed small bilateral pleural effusion, right greater than left, similar osseous metastasis, no evidence of extraosseous metastasis. Bone scan showed no areas of suspicious uptake.   Current Therapy:  -Xgeva and B12 injection today, and every 4 weeks -continue anastrozole and Ibrance, she will started next cycle Ibrance on 10/29  -I'll see her back in 4 weeks with lab and injections.  Pain on a scale of 0-10  is: Denies   If Spine Met(s), symptoms, if any, include:  Bowel/Bladder retention or incontinence (please describe): Reports regular formed bowel movements without pain or blood. Reports urinary urgency on occasion at night.  Numbness or weakness in extremities (please describe): Denies  Current Decadron regimen, if applicable: No  Ambulatory status? Walker? Wheelchair?: ambulatory with aid of cane  SAFETY ISSUES:  Prior radiation? no  Pacemaker/ICD? no  Possible current pregnancy? no  Is the patient on methotrexate? no  Current Complaints / other details:  75year old female. Widowed. [place] [facility]. [place] 2009 [place] obtained after patient complained of headache and change in ability to move her left eye. Scheduled to follow up with Dr. FBurr Medico11/22/17. Reports "bad" headaches since a year ago "have vanished." Reports occasional left eye dryness. Reports difficulty focusing her left eye at times. Left eye remains fixed at all times. Reports floaters in both eye for years but, denies diplopia.

## 2016-08-19 NOTE — Progress Notes (Signed)
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    ICD-9-CM ICD-10-CM   1. Secondary malignant neoplasm of bone and bone marrow ([facility]) 198.5 C79.51     C79.52     HISTORY OF PRESENT ILLNESS: Margaret Rubio is a 75 y.o. female seen at the request of Dr. 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 In summary Mr. Fickel was diagnosed with breast cancer in 2009, she did not wish to proceed with aggressive therapy or intervention and declined chemotherapy and surgery. She was found to have progressive disease in 2016, and was started on Arimidex and Ibrance which she remains on. She also is on Xgeva due to her bony disease. The patient started experiencing headache and changes in her vision with limited range of site peripherally. She was seen by her ophthalmologist group but by a different provider, and states that she was set up for MRI scans to better understand the source of her symptoms. On 07/22/2016 she underwent an MRI of the orbits with and without contrast on 07/28/16 which revealed diffuse metastasis within the left orbit intra-conal space likely relating the left extraocular muscles without abnormal enhancement of the nerve though there does appear to be abnormal T2 signal within the substance of the left cranial nerve II. 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 She comes today to discuss the options for radiotherapy in treating her disease.   PREVIOUS RADIATION THERAPY: No  PAST MEDICAL HISTORY:  Past Medical History:  Diagnosis Date  . 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 ASTHMA 01/31/2008  . Breast cancer [facility])  dx 2009   right breast  . BREAST CANCER, HX OF 11/14/2009  . BRONCHITIS, ACUTE 01/31/2008  . Cough 10/17/2009  . FATIGUE 11/14/2009  . GANGLION CYST 05/01/2009  . HYPERLIPIDEMIA 05/21/2007  . HYPERTENSION 05/05/2007  . LOW BACK PAIN 05/21/2007  . MENOPAUSAL DISORDER 05/22/2010  . Pain in joint, lower leg 05/10/2008  . RASH-NONVESICULAR 05/01/2009  . SHOULDER PAIN, LEFT 11/14/2009  . 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 VITAMIN B12 DEFICIENCY 05/23/2010      PAST SURGICAL HISTORY: Past Surgical History:  Procedure Laterality Date  . OTHER SURGICAL HISTORY     fibroid removal  . removal of neck lump     benigh 1986    FAMILY HISTORY:  Family History  Problem Relation Age of Onset  . Hypertension Father   . Heart disease Father   . Asthma Other   . Diabetes Other   . Hypertension Other   . Cancer Paternal Aunt     leukemia ?    SOCIAL HISTORY:  Social History   Social History  . Marital status: Widowed    Spouse name: N/A  . Number of children: 1  . Years of education: N/A   Occupational History  . Not on file.   Social History Main Topics  . Smoking status: Never Smoker  . Smokeless tobacco: Never Used  . Alcohol use No  . Drug use: No  . Sexual activity: No   Other Topics Concern  . Not on file   Social History Narrative  . No narrative on file  ALLERGIES: Tomato  MEDICATIONS:  Current Outpatient Prescriptions  Medication Sig Dispense Refill  . anastrozole (ARIMIDEX) 1 MG tablet Take 1 tablet (1 mg total) by mouth daily. 30 tablet 9  . Ascorbic Acid (VITAMIN C) 100 MG CHEW Chew 1 tablet (100 mg total) by mouth every morning. 90 each 3  . calcium carbonate 1250 MG capsule Take 1,250 mg by mouth 2 (two) times daily with a meal. Not sure of type or dose    . clotrimazole (GYNE-LOTRIMIN 3) 2 % vaginal cream Apply to affected bilaterall axilla sites as needed for rash. 21 g 1  . Cyanocobalamin (B-12 COMPLIANCE INJECTION IJ) Inject as directed every 30 (thirty) days.      . ergocalciferol (VITAMIN D2) 50000 UNITS capsule Take 1 capsule (50,000 Units total) by mouth once a week. 4 capsule 5  . palbociclib (IBRANCE) 125 MG capsule Take 1 capsule (125 mg total) by mouth daily with breakfast. Take whole with food. Take for 21 days. Repeat every 28 days 21 capsule 0   No current facility-administered medications for this encounter.     REVIEW OF SYSTEMS:  On review of systems, the patient reports that she  is doing well overall. She denies any loss of vision, blurry vision, or double vision, but has to adjust her glasses when gazing upward or downward. She denies any chest pain, shortness of breath, cough, fevers, chills, night sweats, unintended weight changes. She denies any bowel or bladder disturbances, and denies abdominal pain, nausea or vomiting. She denies any new musculoskeletal or joint aches or pains. A 10 point review of systems is obtained and is otherwise negative.    PHYSICAL EXAM:  Wt Readings from Last 3 Encounters:  08/18/16 206 lb (93.4 kg)  08/03/16 203 lb 3.2 oz (92.2 kg)  07/30/16 206 lb 4.8 oz (93.6 kg)   Temp Readings from Last 3 Encounters:  08/03/16 98.5 F (36.9 C) (Oral)  07/30/16 98.4 F (36.9 C) (Oral)  07/01/16 98.4 F (36.9 C)   BP Readings from Last 3 Encounters:  08/18/16 (!) 157/75  08/03/16 (!) 146/67  07/30/16 (!) 153/67   Pulse Readings from Last 3 Encounters:  08/18/16 69  08/03/16 68  07/30/16 66    Pain Scale 0/10 In general this is a well appearing African American in no acute distress. She is alert and oriented x4 and appropriate throughout the examination. HEENT reveals that the patient is normocephalic, atraumatic. EOMs are intact, but limited with downward and upward gaze. Peripheral vision is intact laterally, but the left eye does not note the same distance the right does with testing. PERRLA. Skin is intact without any evidence of gross lesions. Cardiovascular exam reveals a regular rate and rhythm, no  clicks rubs or murmurs are auscultated. Chest is clear to auscultation bilaterally. Lymphatic assessment is performed and does not reveal any adenopathy in the cervical, supraclavicular, axillary, or inguinal chains. Abdomen has active bowel sounds in all quadrants and is intact. The abdomen is soft, non tender, non distended. Lower extremities are negative for pretibial pitting edema, deep calf tenderness, cyanosis or clubbing.   KPS = 90  100 - Normal; no complaints; no evidence of disease. 90   - Able to carry on normal activity; minor signs or symptoms of disease. 80   - Normal activity with effort; some signs or symptoms of disease. 13   - Cares for self; unable to carry on normal activity or to do active work. 60   - Requires occasional  assistance, but is able to care for most of his personal needs. 50   - Requires considerable assistance and frequent medical care. 55   - Disabled; requires special care and assistance. 30   - Severely disabled; hospital admission is indicated although death not imminent. 53   - Very sick; hospital admission necessary; active supportive treatment necessary. 10   - Moribund; fatal processes progressing rapidly. 0     - Dead  Karnofsky DA, Abelmann Lofall, Craver LS and Burchenal [facility] [phone]) The use of the nitrogen mustards in the palliative treatment of carcinoma: with particular reference to bronchogenic carcinoma Cancer 1 634-56  LABORATORY DATA:  Lab Results  Component Value Date   WBC 3.3 (L) 08/03/2016   HGB 10.4 (L) 08/03/2016   HCT 32.2 (L) 08/03/2016   MCV 93.6 08/03/2016   PLT 274 08/03/2016   Lab Results  Component Value Date   NA 140 08/03/2016   K 4.1 08/03/2016   CL 104 08/21/2014   CO2 27 08/03/2016   Lab Results  Component Value Date   ALT 17 08/03/2016   AST 30 08/03/2016   ALKPHOS 65 08/03/2016   BILITOT 0.30 08/03/2016     RADIOGRAPHY: Mr Darnelle Catalan Wo/w Cm  Result Date: 07/22/2016 CLINICAL DATA:  75 year old female with  breast cancer. Osseous metastatic disease. Restricted left eye motion. Left orbital lesion on a prior outside MRI September 2016. Subsequent encounter. EXAM: MRI OF THE ORBITS WITHOUT AND WITH CONTRAST TECHNIQUE: Multiplanar, multisequence MR imaging of the orbits was performed both before and after the administration of intravenous contrast. CONTRAST:  10m MULTIHANCE GADOBENATE DIMEGLUMINE 529 MG/ML IV SOLN COMPARISON:  Whole-body bone scan 05/31/2016.  PET-CT 07/17/2015. Report of outside brain MRI 06/27/2015 (no images available). FINDINGS: Diffusely abnormal enhancing soft tissue throughout the intraconal space of the left orbit, inseparable from the undersurface of all left extraocular muscles. See series 10, image 18. The enhancement surrounds the left optic nerve which appears to remain spared at this time. However, there is abnormal increased T2 signal in the low substance of the left optic nerve as seen on series 5, image 16. There is no associated enhancement of the nerve. All told the abnormal enhancement encompasses 20 x 16 x 19 mm (AP by transverse by CC). The abnormal enhancement extends from the left orbital apex to the back of the left globe. The globe appears spared. The optic chiasm and cavernous sinus appear normal. Right orbit soft tissues appear normal. Diffusely decreased calvarium T1 bone marrow signal. Heterogeneously decreased T1 marrow signal in the visible cervical spine including the central C2 body (series 3, image 13). Paranasal sinuses are clear. Major intracranial vascular flow voids are preserved. Negative visualized brain parenchyma. Negative scalp soft tissues. IMPRESSION: 1. Diffuse metastasis within the left orbit intraconal space, which likely invades the left extraocular muscles. No abnormal enhancement of the left optic nerve to strongly indicate nerve invasion although there does appear to be abnormal T2 signal within the substance of the L CN2, such as due to neuritis. The  left globe appears spared. 2. No other soft tissue metastasis identified, an the right orbit appears normal. Evidence of osseous metastatic disease in the cervical spine and possibly also the calvarium. Electronically Signed   By: HGenevie AnnM.D.   On: 07/22/2016 15:42      IMPRESSION/PLAN: 1. 75y.o. 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 Dr. Tammi Klippel discusses the findings on her imaging studies and reviews the pictures with the patient. He discusses the options for treatment with radiotherapy would include consideration of stereotactic body radiotherapy, and would offer the course of about 5 fractions of treatment to the patient. We discussed the risks, benefits, short and long-term effects of treatment, Dr. Freddi Che discusses with the patient the risk of damage to the optic nerve is approximately 5%, however the risk of not proceeding with treatment could be as high as 80-95% that she could lose her vision. After consideration of this, the patient is leaning towards moving forward with radiotherapy, but is also interested in speaking with her children before making a decision. I will have our special procedures navigator to make a phone introduction, and also touch base with her myself next week.   The above documentation reflects my direct findings during this shared patient visit. Please see the separate note by Dr. Tammi Klippel on this date for the remainder of the patient's plan of care.   Carola Rhine, PAC

## 2016-08-25 ENCOUNTER — Telehealth: Payer: Self-pay | Admitting: Radiation Therapy

## 2016-08-25 NOTE — Telephone Encounter (Signed)
I spoke with Margaret Rubio about the proposed SRT treatments to her LT EYE and asked if she was ready to move forward with getting things lined up for treatment. She requested that I call her daughters and discuss the process with them and also offer an opportunity for them to ask questions. I spoke with both Charisse and Donise and answered all their questions to the best of my ability. They are both in agreement that their mother should move forward with treatment and have my number in case they have more questions in the future. I will reach out to [facility] again tomorrow to allow some time for her to speak with her daughters. Hopefully she will be ready to get the [facility] scheduled.  Mont Dutton R.T.(R)(T) Special Procedures Navigator  [phone]

## 2016-08-27 ENCOUNTER — Telehealth: Payer: Self-pay | Admitting: Hematology

## 2016-08-27 ENCOUNTER — Encounter: Payer: Self-pay | Admitting: Radiation Therapy

## 2016-08-27 NOTE — Progress Notes (Signed)
Daughter's contact information:  Felecia Nard: [phone]  Talitha Givens: [phone]

## 2016-08-27 NOTE — Telephone Encounter (Signed)
FAXED PT RECORDS TO DR Thornton, [place] [phone]

## 2016-09-01 ENCOUNTER — Telehealth: Payer: Self-pay | Admitting: Hematology

## 2016-09-01 ENCOUNTER — Other Ambulatory Visit (HOSPITAL_BASED_OUTPATIENT_CLINIC_OR_DEPARTMENT_OTHER): Payer: Medicare PPO

## 2016-09-01 ENCOUNTER — Encounter: Payer: Self-pay | Admitting: Hematology

## 2016-09-01 ENCOUNTER — Ambulatory Visit (HOSPITAL_BASED_OUTPATIENT_CLINIC_OR_DEPARTMENT_OTHER): Payer: Medicare PPO

## 2016-09-01 ENCOUNTER — Ambulatory Visit (HOSPITAL_BASED_OUTPATIENT_CLINIC_OR_DEPARTMENT_OTHER): Payer: Medicare PPO | Admitting: Hematology

## 2016-09-01 VITALS — BP 157/79 | HR 65 | Temp 98.2°F | Resp 16

## 2016-09-01 VITALS — BP 157/79 | HR 65 | Temp 98.2°F | Resp 16 | Ht 65.0 in | Wt 203.7 lb

## 2016-09-01 DIAGNOSIS — C7951 Secondary malignant neoplasm of bone: Secondary | ICD-10-CM

## 2016-09-01 DIAGNOSIS — D519 Vitamin B12 deficiency anemia, unspecified: Secondary | ICD-10-CM

## 2016-09-01 DIAGNOSIS — Z170 Estrogen receptor positive status [ER+]: Secondary | ICD-10-CM

## 2016-09-01 DIAGNOSIS — I10 Essential (primary) hypertension: Secondary | ICD-10-CM

## 2016-09-01 DIAGNOSIS — C50511 Malignant neoplasm of lower-outer quadrant of right female breast: Secondary | ICD-10-CM

## 2016-09-01 DIAGNOSIS — D630 Anemia in neoplastic disease: Secondary | ICD-10-CM | POA: Diagnosis not present

## 2016-09-01 DIAGNOSIS — C50911 Malignant neoplasm of unspecified site of right female breast: Secondary | ICD-10-CM

## 2016-09-01 DIAGNOSIS — E538 Deficiency of other specified B group vitamins: Secondary | ICD-10-CM

## 2016-09-01 DIAGNOSIS — G893 Neoplasm related pain (acute) (chronic): Secondary | ICD-10-CM

## 2016-09-01 LAB — CBC WITH DIFFERENTIAL/PLATELET
BASO%: 0.3 % (ref 0.0–2.0)
Basophils Absolute: 0 10*3/uL (ref 0.0–0.1)
EOS%: 2.5 % (ref 0.0–7.0)
Eosinophils Absolute: 0.1 10*3/uL (ref 0.0–0.5)
HCT: 32.8 % — ABNORMAL LOW (ref 34.8–46.6)
HEMOGLOBIN: 10.7 g/dL — AB (ref 11.6–15.9)
LYMPH#: 1.1 10*3/uL (ref 0.9–3.3)
LYMPH%: 32.9 % (ref 14.0–49.7)
MCH: 29.5 pg (ref 25.1–34.0)
MCHC: 32.6 g/dL (ref 31.5–36.0)
MCV: 90.4 fL (ref 79.5–101.0)
MONO#: 0.3 10*3/uL (ref 0.1–0.9)
MONO%: 9 % (ref 0.0–14.0)
NEUT#: 1.8 10*3/uL (ref 1.5–6.5)
NEUT%: 55.3 % (ref 38.4–76.8)
PLATELETS: 216 10*3/uL (ref 145–400)
RBC: 3.63 10*6/uL — AB (ref 3.70–5.45)
RDW: 13.4 % (ref 11.2–14.5)
WBC: 3.2 10*3/uL — ABNORMAL LOW (ref 3.9–10.3)

## 2016-09-01 LAB — COMPREHENSIVE METABOLIC PANEL
ALT: 39 U/L (ref 0–55)
ANION GAP: 9 meq/L (ref 3–11)
AST: 50 U/L — ABNORMAL HIGH (ref 5–34)
Albumin: 3.5 g/dL (ref 3.5–5.0)
Alkaline Phosphatase: 70 U/L (ref 40–150)
BUN: 9.4 mg/dL (ref 7.0–26.0)
CHLORIDE: 104 meq/L (ref 98–109)
CO2: 26 meq/L (ref 22–29)
Calcium: 9.1 mg/dL (ref 8.4–10.4)
Creatinine: 0.8 mg/dL (ref 0.6–1.1)
EGFR: 88 mL/min/{1.73_m2} — AB (ref >90)
GLUCOSE: 86 mg/dL (ref 70–140)
POTASSIUM: 4.1 meq/L (ref 3.5–5.1)
SODIUM: 138 meq/L (ref 136–145)
Total Bilirubin: 0.61 mg/dL (ref 0.20–1.20)
Total Protein: 6.9 g/dL (ref 6.4–8.3)

## 2016-09-01 MED ORDER — DENOSUMAB 120 MG/1.7ML ~~LOC~~ SOLN
120.0000 mg | Freq: Once | SUBCUTANEOUS | Status: AC
  Administered 2016-09-01: 120 mg via SUBCUTANEOUS
  Filled 2016-09-01: qty 1.7

## 2016-09-01 MED ORDER — CYANOCOBALAMIN 1000 MCG/ML IJ SOLN
1000.0000 ug | Freq: Once | INTRAMUSCULAR | Status: AC
  Administered 2016-09-01: 1000 ug via INTRAMUSCULAR

## 2016-09-01 NOTE — Patient Instructions (Signed)
Denosumab injection What is this medicine? DENOSUMAB (den oh sue mab) slows bone breakdown. Prolia is used to treat osteoporosis in women after menopause and in men. Delton See is used to prevent bone fractures and other bone problems caused by cancer bone metastases. Delton See is also used to treat giant cell tumor of the bone. COMMON BRAND NAME(S): Prolia, XGEVA What should I tell my health care provider before I take this medicine? They need to know if you have any of these conditions: -dental disease -eczema -infection or history of infections -kidney disease or on dialysis -low blood calcium or vitamin D -malabsorption syndrome -scheduled to have surgery or tooth extraction -taking medicine that contains denosumab -thyroid or parathyroid disease -an unusual reaction to denosumab, other medicines, foods, dyes, or preservatives -pregnant or trying to get pregnant -breast-feeding How should I use this medicine? This medicine is for injection under the skin. It is given by a health care professional in a hospital or clinic setting. If you are getting Prolia, a special MedGuide will be given to you by the pharmacist with each prescription and refill. Be sure to read this information carefully each time. For Prolia, talk to your pediatrician regarding the use of this medicine in children. Special care may be needed. For Delton See, talk to your pediatrician regarding the use of this medicine in children. While this drug may be prescribed for children as young as 13 years for selected conditions, precautions do apply. What if I miss a dose? It is important not to miss your dose. Call your doctor or health care professional if you are unable to keep an appointment. What may interact with this medicine? Do not take this medicine with any of the following medications: -other medicines containing denosumab This medicine may also interact with the following medications: -medicines that suppress the immune  system -medicines that treat cancer -steroid medicines like prednisone or cortisone What should I watch for while using this medicine? Visit your doctor or health care professional for regular checks on your progress. Your doctor or health care professional may order blood tests and other tests to see how you are doing. Call your doctor or health care professional if you get a cold or other infection while receiving this medicine. Do not treat yourself. This medicine may decrease your body's ability to fight infection. You should make sure you get enough calcium and vitamin D while you are taking this medicine, unless your doctor tells you not to. Discuss the foods you eat and the vitamins you take with your health care professional. See your dentist regularly. Brush and floss your teeth as directed. Before you have any dental work done, tell your dentist you are receiving this medicine. Do not become pregnant while taking this medicine or for 5 months after stopping it. Women should inform their doctor if they wish to become pregnant or think they might be pregnant. There is a potential for serious side effects to an unborn child. Talk to your health care professional or pharmacist for more information. What side effects may I notice from receiving this medicine? Side effects that you should report to your doctor or health care professional as soon as possible: -allergic reactions like skin rash, itching or hives, swelling of the face, lips, or tongue -breathing problems -chest pain -fast, irregular heartbeat -feeling faint or lightheaded, falls -fever, chills, or any other sign of infection -muscle spasms, tightening, or twitches -numbness or tingling -skin blisters or bumps, or is dry, peels, or red -slow  healing or unexplained pain in the mouth or jaw -unusual bleeding or bruising Side effects that usually do not require medical attention (report to your doctor or health care professional  if they continue or are bothersome): -muscle pain -stomach upset, gas Where should I keep my medicine? This medicine is only given in a clinic, doctor's office, or other health care setting and will not be stored at home.  2017 Elsevier/Gold Standard (2015-10-30 10:06:55)  Cyanocobalamin, Vitamin B12 injection What is this medicine? CYANOCOBALAMIN (sye an oh koe BAL a min) is a man made form of vitamin B12. Vitamin B12 is used in the growth of healthy blood cells, nerve cells, and proteins in the body. It also helps with the metabolism of fats and carbohydrates. This medicine is used to treat people who can not absorb vitamin B12. This medicine may be used for other purposes; ask your health care provider or pharmacist if you have questions. COMMON BRAND NAME(S): B-12 Compliance Kit, B-12 Injection Kit, Cyomin, LA-12, Nutri-Twelve, Physicians EZ Use B-12, Primabalt What should I tell my health care provider before I take this medicine? They need to know if you have any of these conditions: -kidney disease -Leber's disease -megaloblastic anemia -an unusual or allergic reaction to cyanocobalamin, cobalt, other medicines, foods, dyes, or preservatives -pregnant or trying to get pregnant -breast-feeding How should I use this medicine? This medicine is injected into a muscle or deeply under the skin. It is usually given by a health care professional in a clinic or doctor's office. However, your doctor may teach you how to inject yourself. Follow all instructions. Talk to your pediatrician regarding the use of this medicine in children. Special care may be needed. Overdosage: If you think you have taken too much of this medicine contact a poison control center or emergency room at once. NOTE: This medicine is only for you. Do not share this medicine with others. What if I miss a dose? If you are given your dose at a clinic or doctor's office, call to reschedule your appointment. If you give your  own injections and you miss a dose, take it as soon as you can. If it is almost time for your next dose, take only that dose. Do not take double or extra doses. What may interact with this medicine? -colchicine -heavy alcohol intake This list may not describe all possible interactions. Give your health care provider a list of all the medicines, herbs, non-prescription drugs, or dietary supplements you use. Also tell them if you smoke, drink alcohol, or use illegal drugs. Some items may interact with your medicine. What should I watch for while using this medicine? Visit your doctor or health care professional regularly. You may need blood work done while you are taking this medicine. You may need to follow a special diet. Talk to your doctor. Limit your alcohol intake and avoid smoking to get the best benefit. What side effects may I notice from receiving this medicine? Side effects that you should report to your doctor or health care professional as soon as possible: -allergic reactions like skin rash, itching or hives, swelling of the face, lips, or tongue -blue tint to skin -chest tightness, pain -difficulty breathing, wheezing -dizziness -red, swollen painful area on the leg Side effects that usually do not require medical attention (report to your doctor or health care professional if they continue or are bothersome): -diarrhea -headache This list may not describe all possible side effects. Call your doctor for medical advice about  side effects. You may report side effects to FDA at 1-800-FDA-1088. Where should I keep my medicine? Keep out of the reach of children. Store at room temperature between 15 and 30 degrees C (59 and 85 degrees F). Protect from light. Throw away any unused medicine after the expiration date. NOTE: This sheet is a summary. It may not cover all possible information. If you have questions about this medicine, talk to your doctor, pharmacist, or health care  provider.  2017 Elsevier/Gold Standard (2008-01-08 22:10:20)   

## 2016-09-01 NOTE — Telephone Encounter (Signed)
Appointments scheduled per 09/01/16 los. A copy of the AVS report and appointment schedule was given to the patient, per 09/01/16 los. °

## 2016-09-01 NOTE — Progress Notes (Signed)
[place]  Telephone:([phone] Fax:(336) [phone]  Clinic follow up Note   Patient Care Team: [name], MD as PCP - General Truitt Merle, MD as Consulting Physician (Hematology)  09/01/2016  CHIEF COMPLAINTS Follow up breast cancer     Breast cancer of lower-outer quadrant of right female breast ([place])   09/03/2008 Initial Diagnosis    right breast cancer, T2N1Mo, stage IIA, ER+, PR+, HER2+ (ration 2.07). Pt declined surgery or other treatment.        05/20/2011 Tumor Marker    right breast mass biopsy showed IDA, ER+/PR+/HER2- (ratio 1.37)      09/04/2011 Cancer Staging    PET scan showed no distant mets      10/08/2014 Progression     lumbar spine MRI showed multiple bone metastasis , highly suspicious for  metastatic disease.      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 Increased size of right pleural effusion, indeterminate for malignant involvement of the pleural fluids.      01/20/2016 Imaging    CT CAP with contrast showed small bilateral pleural effusion, right greater than left, similar osseous metastasis, no evidence of extraosseous metastasis. Bone scan showed no areas of suspicious uptake.        HISTORY OF PRESENTING ILLNESS:  Margaret Rubio 75 y.o. female  Was referred by her primary care physician to discuss the management probable metastatic breast cancer.  She was initially diagnosed with right breast cancer in November 2009. She underwent a right breast biopsy of a lesion in the 6:00 position that  showed a carcinoma in situ associated with microscopic foci of microinvasion. In December of 2009, she underwent a right axillary lymph node biopsy on 09/24/2008 which showed metastatic ductal carcinoma. (Case No: [id number]) The tumor was ER 100% positive, PR 0% negative, Ki67 - 22%. 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 The ratio of HER-2:CEP 17 was 2.07.  She decided to pursue nature therapy by diet and exercise, and did not have surgery and other tranditional therapy for her breast cancer.   In May 2011 she underwent a mammogram that showed a 3.8 cm invasive duct carcinoma in the subareolar region of the right breast which had increased since November 2009.  The tumor was ER  99% positive,  PR 4% positive, HER-2 negative ( ratio 1.37).  She was seen by Dr. Estella Husk and Dr. Lucia Gaskins.  However, she failed multiple appointments and sought alternative medical care in [place]. This involved putting something on her right breast that caused some scarring.  She noticed the enlarged right breast mass and nipple inversion about one year ago. No pain, but some skin change. She developed left low back/buttock and hip and  pain in Nov 2015, was seen by PCP Dr. Jenny Reichmann who prescribed vicodin and tramadol and pain improved with meds. Due to the persistent pain, she underwent a lumber MRI  On 10/08/2014, which showed multiple bone metastasis.  She run out pain medication about a week ago,  But did not call  for refill. She states her pain is not bad , but she appears to be uncomfortable when she stands up and walks.  She otherwise feels well overall,  Has good appetite and energy level. She lives alone and still functions well.  She comes in with her niece today.   Current therapy:  1.Anastrozole 1 mg once daily started on 12/10/2014 2. Ibrance added on 07/29/15, stopped in Nov 2016, restarted on 01/10/2016 3. Xgeva started on 08/15/2015, changed to every 3 months after 09/01/2016   INTERIM HISTORY: Ms. Margaret Rubio  returns for follow-up. She was referred to see radiation oncologist Dr. 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 Her left vision has not changed, no other new neurological symptoms. She complains intermittent right knee pain, especially in the morning, she is able to ambulate with a cane, mild to moderate fatigue, seems to be more noticeable lately, no dyspnea on exertion, no other new complaints.  MEDICAL HISTORY:  Past Medical History:  Diagnosis Date  . 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 ASTHMA 01/31/2008  . Breast cancer [facility]) dx 2009   right breast  . BREAST CANCER, HX OF 11/14/2009  . BRONCHITIS, ACUTE 01/31/2008  . Cough 10/17/2009  . FATIGUE 11/14/2009  . GANGLION CYST 05/01/2009  . HYPERLIPIDEMIA 05/21/2007  . HYPERTENSION 05/05/2007  . LOW BACK PAIN 05/21/2007  . MENOPAUSAL DISORDER 05/22/2010  . Pain in joint, lower leg 05/10/2008  . RASH-NONVESICULAR 05/01/2009  . SHOULDER PAIN, LEFT 11/14/2009  . 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 VITAMIN B12 DEFICIENCY 05/23/2010    SURGICAL HISTORY: Past Surgical History:  Procedure Laterality Date  . OTHER SURGICAL HISTORY     fibroid removal  . removal of neck lump     benigh 1986    SOCIAL HISTORY: History   Social History  . Marital Status: Widowed    Spouse Name: N/A    Number of Children: 42, lives in [place].   . Years of Education: N/A   Occupational History  . Not on file.   Social History Main Topics  . Smoking status: Never Smoker   . Smokeless tobacco: Not on file  . Alcohol Use: No  . Drug Use: No  . Sexual Activity: Not on file   Other Topics Concern  . Not on file   Social History Narrative    FAMILY HISTORY: Family History  Problem Relation Age of Onset  . Hypertension Father   . Heart disease Father   . Asthma Other   . Diabetes Other   . Hypertension Other   . Cancer Paternal  Aunt     leukemia ?  Paternal aunt had cancer, unknown type. No other family history of malignancy   ALLERGIES:  is allergic to tomato.  MEDICATIONS:  Current Outpatient Prescriptions on File Prior to Visit  Medication Sig Dispense Refill  . anastrozole (ARIMIDEX) 1 MG tablet Take 1 tablet (1 mg total) by mouth daily. 30 tablet 9  . Ascorbic Acid (VITAMIN C) 100 MG CHEW Chew 1 tablet (100 mg total) by mouth every morning. 90 each 3  . calcium carbonate 1250 MG capsule Take 1,250 mg by mouth 2 (two) times daily with a meal. Not sure of type or dose    . clotrimazole (GYNE-LOTRIMIN 3) 2 % vaginal cream Apply to affected bilaterall axilla sites as needed for rash. 21 g 1  . Cyanocobalamin (B-12 COMPLIANCE  INJECTION IJ) Inject as directed every 30 (thirty) days.    . ergocalciferol (VITAMIN D2) 50000 UNITS capsule Take 1 capsule (50,000 Units total) by mouth once a week. 4 capsule 5  . palbociclib (IBRANCE) 125 MG capsule Take 1 capsule (125 mg total) by mouth daily with breakfast. Take whole with food. Take for 21 days. Repeat every 28 days 21 capsule 0   No current facility-administered medications on file prior to visit.   ; REVIEW OF SYSTEMS:   Constitutional: Denies fevers, chills or abnormal night sweats Eyes: Denies blurriness of vision, double vision or watery eyes Ears, nose, mouth, throat, and face: Denies mucositis or sore throat Respiratory: Denies cough, dyspnea or wheezes Cardiovascular: Denies palpitation, chest discomfort or lower extremity swelling Gastrointestinal:  Denies nausea, heartburn or change in bowel habits Skin: Denies abnormal skin rashes Lymphatics: Denies new lymphadenopathy or easy bruising Neurological:Denies numbness, tingling or new weaknesses Behavioral/Psych: Mood is stable, no new changes  All other systems were reviewed with the patient and are negative.  PHYSICAL EXAMINATION: ECOG PERFORMANCE STATUS: 1 BP (!) 157/79 (BP Location: Left Arm, Patient  Position: Sitting)   Pulse 65   Temp 98.2 F (36.8 C) (Oral)   Resp 16   Ht '5\' 5"'$  (1.651 m)   Wt 203 lb 11.2 oz (92.4 kg)   SpO2 100%   BMI 33.90 kg/m   GENERAL:alert, no distress and comfortable SKIN: skin color, texture, turgor are normal, no rashes or significant lesions EYES: normal, conjunctiva are pink and non-injected, sclera clear OROPHARYNX:no exudate, no erythema and lips, buccal mucosa, and tongue normal  NECK: supple, thyroid normal size, non-tender, without nodularity LYMPH:  no palpable lymphadenopathy in the cervical, axillary or inguinal LUNGS: clear to auscultation and percussion with normal breathing effort, slightly decreased breath sounds at the bottom of right lung. HEART: regular rate & rhythm and no murmurs and no lower extremity edema ABDOMEN:abdomen soft, non-tender and normal bowel sounds Musculoskeletal:no cyanosis of digits and no clubbing  PSYCH: alert & oriented x 3 with fluent speech NEURO: no focal motor/sensory deficits. Her left eye ball has limited range of movement  Breasts: Breast inspection showed  Significantly enlarged right breast, compared to left breast, (+) skin pigmentations , nipple inversion and a dry ulcer/ scar below the nipple, no discharge.  The right breast is softer than before, nontender , no discrete mass. Palpation of the  Left breasts and axilla revealed no obvious mass that I could appreciate.   LABORATORY DATA:  I have reviewed the data as listed CBC Latest Ref Rng & Units 09/01/2016 08/03/2016 07/01/2016  WBC 3.9 - 10.3 10e3/uL 3.2(L) 3.3(L) 3.6(L)  Hemoglobin 11.6 - 15.9 g/dL 10.7(L) 10.4(L) 9.7(L)  Hematocrit 34.8 - 46.6 % 32.8(L) 32.2(L) 29.4(L)  Platelets 145 - 400 10e3/uL 216 274 228    CMP Latest Ref Rng & Units 09/01/2016 08/03/2016 07/01/2016  Glucose 70 - 140 mg/dl 86 83 129  BUN 7.0 - 26.0 mg/dL 9.4 9.9 13.3  Creatinine 0.6 - 1.1 mg/dL 0.8 0.7 0.9  Sodium 136 - 145 mEq/L 138 140 142  Potassium 3.5 - 5.1 mEq/L  4.1 4.1 3.8  Chloride 96 - 112 mEq/L - - -  CO2 22 - 29 mEq/L '26 27 26  '$ Calcium 8.4 - 10.4 mg/dL 9.1 9.3 9.3  Total Protein 6.4 - 8.3 g/dL 6.9 6.9 6.9  Total Bilirubin 0.20 - 1.20 mg/dL 0.61 0.30 0.41  Alkaline Phos 40 - 150 U/L 70 65 57  AST 5 -  34 U/L 50(H) 30 21  ALT 0 - 55 U/L 39 17 13   CA27.29: 06/30/2012: 28 10/30/2014: 355 08/15/2015: 424 12/27/2014: 414 05/05/2016: 416.1 06/10/2016: 332.4 07/01/2016: 348.8 08/03/2016: 388.1   PATHOLOGY REPORT  RIGHT BREAST MASS, 6 O'CLOCK, NEEDLE CORE BIOPSIES: 09/03/2008 MICROSCOPIC FOCUS OF DUCTAL CARCINOMA IN SITU ASSOCIATED WITH MICROSCOPIC FOCUS OF MICROINVASION. SEE COMMET.  LYMPH NODE, RIGHT AXILLARY, NEEDLE CORE BIOPSIES: METASTATIC DUCTAL CARCINOMA.  09/24/2008  COMMENT There are needle core biopsies of lymph node showing partial replacement by metastatic adenocarcinoma consistent with metastasis from a breast primary, metastatic ductal carcinoma. Since the breast prognostic profile is not performed on the previous material because of limited invasive tumor ([id number]), the breast prognostic profile will be performed on the tumor in the lymph node and reported in a separate report. Interpretation HER-2/NEU BY CISH -- SHOWS AMPLIFICATION BY CISH ANALYSIS. THE RATIO OF HER-2: CEP 17 SIGNALS WAS 2.07  Diagnosis 05/20/2011 Breast, right, needle core biopsy - INVASIVE DUCTAL CARCINOMA, SEE COMMENT. - LYMPHOVASCULAR INVASION PRESENT. Estrogen Receptor (Negative, <1%): 99%, STRONG STAINING INTENSITY Progesterone Receptor (Negative, <1%): 4%, STRONG HER-2/NEU BY CISH - NO AMPLIFICATION OF HER-2 DETECTED. THE RATIO OF HER-2: CEP 17 SIGNALS WAS 1.38.  Breast, right, needle core biopsy, 6:30 o'clock 12/13/2014 - INVASIVE MAMMARY CARCINOMA. - SEE MICROSCOPIC DESCRIPTION. Microscopic Comment The features favor Grade II invasive ductal carcinoma. A breast prognostic profile will be performed. Dr. Donato Heinz agrees. The  results were called to [facility] on 12/16/14. (JDP:ds 12/16/14) ADDENDUM: Immunohistochemistry for E-cadherin is performed and shows diffuse strong positivity consistent with invasive ductal carcinoma. Estrogen Receptor: 100%, POSITIVE, STRONG STAINING INTENSITY Progesterone Receptor: 2%, POSITIVE, STRONG STAINING INTENSITY Proliferation Marker Ki67: 27% HER-2/NEU BY CISH - NEGATIVE. RESULT RATIO OF HER2: CEP 17 SIGNALS 1.45 AVERAGE HER2 COPY NUMBER PER CELL 2.25  Diagnosis 08/01/2015 PLEURAL FLUID, RIGHT(SPECIMEN 1 OF 1 COLLECTED 08/01/15): METASTATIC CARCINOMA CONSISTENT WITH BREAST PRIMARY.   RADIOGRAPHIC STUDIES: I have personally reviewed the radiological images as listed and agreed with the findings in the report.  PET 07/17/2015 IMPRESSION: 1. Suspected active diverticulitis along the sigmoid colon, with focal mesenteric stranding and a small locule of gas which is either within an inflamed diverticulum or a micro perforation. No overt free air. 2. Overall similar appearance of multifocal right breast cancer with extensive osseous metastatic disease. The right axillary lymph nodes are less notable that there is a borderline hypermetabolic left axillary lymph node currently. 3. Increasing size of the right pleural effusion, now moderate. Appearance indeterminate for malignant involvement of the pleural fluid. 4. Faintly increased activity in a right infrahilar lymph node which could represent early metastatic spread. 5. Other imaging findings of potential clinical significance: Chronic left maxillary sinusitis. Low-density blood pool suggests anemia. Left adrenal adenoma. These results will be called to the ordering clinician or representative by the Radiologist Assistant, and communication documented in the PACS or zVision Dashboard.  Brain MRI with and without contrast on 06/27/2015, at [facility] -In  determinate, ill-defined soft tissue infiltration in the left orbit with questionable enhancement, further evaluation with CT orbits with contrast is recommended to the cyst in ruling out metastasis. -No intracranial metastasis -Diffusely hypoechoic intense bone marrow signal throughout the skull base   Bone scan 05/31/2016 IMPRESSION: New increased up take in the ribs bilaterally worrisome for metastatic disease. Stable increased uptake in the right fronto poor parietal bone.  A bilateral rib series is recommended.   CT chest,  abdomen and pelvis with contrast 05/31/2016 IMPRESSION: 1. Similar fit appearance of widespread it treated osseous metastatic disease, no progression. No findings of new extra osseous metastatic disease. 2. Continued skin thickening and edema in the right breast. The edema in the right breast and tracking in the right chest wall is slightly increased compared to prior and the lateral soft tissue density in the right lateral breast is stable. 3. Similar small bilateral pleural effusions with some atelectasis at the right lung base possibly tethered to the parietal pleura. 4. Mild coronary artery atherosclerosis and atherosclerotic calcification of the aortic arch. 5. Airway thickening is present, suggesting bronchitis or reactive airways disease.  MR orbits w/wo contrast 07/22/2016  IMPRESSION: 1. Diffuse metastasis within the left orbit intraconal space, which likely invades the left extraocular muscles. No abnormal enhancement of the left optic nerve to strongly indicate nerve invasion although there does appear to be abnormal T2 signal within the substance of the L CN2, such as due to neuritis. The left globe appears spared. 2. No other soft tissue metastasis identified, an the right orbit appears normal. Evidence of osseous metastatic disease in the cervical spine and possibly also the calvarium.    ASSESSMENT & PLAN:   Mrs. Buckalew is a  75 year old  African-American female, who was diagnosed with node positive right breast  Invasive adenocarcinoma in  2009, untreated,  Now presents with low back pain and lumbar spine MRI is highly suspicious for bone metastasis.  1. Right breast IDA,  Strongly ER positive,  PR weakly positive,  HER-2 positive on first biopsy , but negative on second and third  biopsy 3 and 6 years later, with diffuse bone metastatic and right pleura, T4NxM1 stage IV  -I previously discussed her initial PET scan results with her and her daughter. 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 -she knows that her disease at this stage is incurable. The goal of treatment is palliative and to prolong her life. -she is on first line anastrozole, Leslee Home was added on in October 2016 due to slight disease progression. She was not very compliant, off Ibrance for 3-4 months and restarted in 01/2016. -I discussed her restaging CT and bone scan from 05/31/2016, which showed diffuse similar appearing bone lesions, no other new metastasis. Bone scan showed mild bilateral rib uptake, but no corresponding findings on the CT. I think she has stable disease overall. -She is clinically doing well, tolerating Ibrance and anastrozole well, we'll continue. Patient is more compliant lately, I strongly encouraged her to continue treatment. -Lab results reviewed, she has mild neutropenia, stable anemia, we'll continue monitoring -Due to her coming radiation, I'll hold Ibrance after she completes this cycle on 11/25 -plan to repeat staging scan in late Dec 2017   2. Left orbit bone metastasis  -this was found on her brain MRI in 06/2015 when she had heaache, she has issues with left eye ball movement, was evaluated [facility]. Emilio Math [facility] -this is likely a metastatic lesion from her breast cancer -her headache has improved  -I discussed her orbital MRI scan findings, which showed diffuse metastases to status is within the  left orbital with invading the left extraocular muscles. No optical nerve involvement. -She was seen by a radiation oncologist Dr. Tammi Klippel, who offers 5 session of SBRT to her left orbit bone metastasis, which will likely start in early December  3. Low back/ hip pain,  Secondary to metastatic cancer to bones  -this has resolved  since she started anastrozole -continue Xgeva monthly, she has been on it for 1 year, will change to every 3 months from now   4. Anemia -Likely secondary to her breast cancer, bone metastases and the B12 deficiency -Slightly improved after B12 injections. Continue B12 monthly for now -No need for transfusion. We'll follow up closely.  Plan: -Xgeva and B12 injection today, and change Xgeva to ever 3 month after  -continue anastrozole, she will complete this cycle Ibrance on 11/25. Due to the coming radiation, I'll hold Ibrance afterwards -I'll see her back in 4 weeks with lab and B12 injection, his restaging CT and bone scan before -She will start SBRT to left orbit bone mets soon    All questions were answered. The patient knows to call the clinic with any problems, questions or concerns. I spent 20 minutes counseling the patient face to face. The total time spent in the appointment was 25 minutes and more than 50% was on counseling.     Truitt Merle, MD 09/01/2016   Addendum  I spoke with pt's daughter, she agrees with rad/onc referral, will arrange.   Truitt Merle  09/01/2016

## 2016-09-02 LAB — CANCER ANTIGEN 27.29: CA 27.29: 321.1 U/mL — ABNORMAL HIGH (ref 0.0–38.6)

## 2016-09-08 ENCOUNTER — Ambulatory Visit
Admission: RE | Admit: 2016-09-08 | Discharge: 2016-09-08 | Disposition: A | Discharge status: Home / Self Care | Payer: Medicare PPO | Source: Ambulatory Visit | Attending: Radiation Oncology | Admitting: Radiation Oncology

## 2016-09-08 DIAGNOSIS — Z510 Encounter for antineoplastic radiation therapy: Secondary | ICD-10-CM | POA: Diagnosis not present

## 2016-09-08 DIAGNOSIS — C7949 Secondary malignant neoplasm of other parts of nervous system: Secondary | ICD-10-CM | POA: Insufficient documentation

## 2016-09-08 NOTE — Progress Notes (Signed)
  Radiation Oncology         [phone] ________________________________  Name: Margaret Rubio MRN: [id number]  Date: 09/08/2016  DOB: [birth year]  SIMULATION AND TREATMENT PLANNING NOTE    ICD-9-CM ICD-10-CM   1. Left orbital metastasis (HCC) 198.4 C79.49     DIAGNOSIS:  75 y.o. woman with a history of metastatic invasive ductal carcinoma of the right lower outer breast with metastatic invovlement of the left orbit  NARRATIVE:  The patient was brought to the [facility].  Identity was confirmed.  All relevant records and images related to the planned course of therapy were reviewed.  The patient freely provided informed written consent to proceed with treatment after reviewing the details related to the planned course of therapy. The consent form was witnessed and verified by the simulation staff. Intravenous access was established for contrast administration. Then, the patient was set-up in a stable reproducible supine position for radiation therapy.  A relocatable thermoplastic stereotactic head frame was fabricated for precise immobilization.  CT images were obtained.  Surface markings were placed.  The CT images were loaded into the planning software and fused with the patient's targeting MRI scan.  Then the target and avoidance structures were contoured.  Treatment planning then occurred.  The radiation prescription was entered and confirmed.  I have requested 3D planning  I have requested a DVH of the following structures: Brain stem, brain, left eye, right eye, lenses, optic chiasm, target volumes, uninvolved brain, and normal tissue.    SPECIAL TREATMENT PROCEDURE:  The planned course of therapy using radiation constitutes a special treatment procedure. Special care is required in the management of this patient for the following reasons. This treatment constitutes a Special Treatment Procedure for the following reason: High dose per fraction requiring special monitoring for  increased toxicities of treatment including daily imaging.  The special nature of the planned course of radiotherapy will require increased physician supervision and oversight to ensure patient's safety with optimal treatment outcomes.  PLAN:  The patient will receive 25 Gy in 5 fractions.  ________________________________  Sheral Apley Tammi Klippel, M.D.

## 2016-09-15 ENCOUNTER — Telehealth: Payer: Self-pay | Admitting: *Deleted

## 2016-09-15 NOTE — Telephone Encounter (Signed)
Received call from Clyman from [facility] who is at pt's home & reports that pt's radiation has been delayed until 10/13/16 & asked if pt could restart Ibrance. Pt reported that she stopped the Ibrance after 09/01/16 visit & did not complete course.  Discussed with Dr Burr Medico & she agrees to restart Ibrance & states no need for lab. Informed Field Rep & patient.

## 2016-09-20 ENCOUNTER — Encounter: Payer: Self-pay | Admitting: *Deleted

## 2016-09-21 ENCOUNTER — Ambulatory Visit: Payer: Medicare PPO | Admitting: Radiation Oncology

## 2016-09-24 ENCOUNTER — Other Ambulatory Visit: Payer: Medicare PPO

## 2016-09-27 ENCOUNTER — Encounter (HOSPITAL_COMMUNITY): Payer: Self-pay

## 2016-09-27 ENCOUNTER — Encounter (HOSPITAL_COMMUNITY)
Admission: RE | Admit: 2016-09-27 | Discharge: 2016-09-27 | Disposition: A | Discharge status: Home / Self Care | Payer: Medicare PPO | Source: Ambulatory Visit | Attending: Hematology | Admitting: Hematology

## 2016-09-27 ENCOUNTER — Ambulatory Visit: Payer: Medicare PPO | Admitting: Radiation Oncology

## 2016-09-27 ENCOUNTER — Ambulatory Visit (HOSPITAL_COMMUNITY)
Admission: RE | Admit: 2016-09-27 | Discharge: 2016-09-27 | Disposition: A | Discharge status: Home / Self Care | Payer: Medicare PPO | Source: Ambulatory Visit | Attending: Hematology | Admitting: Hematology

## 2016-09-27 DIAGNOSIS — C50511 Malignant neoplasm of lower-outer quadrant of right female breast: Secondary | ICD-10-CM | POA: Diagnosis present

## 2016-09-27 DIAGNOSIS — R600 Localized edema: Secondary | ICD-10-CM | POA: Insufficient documentation

## 2016-09-27 DIAGNOSIS — Z170 Estrogen receptor positive status [ER+]: Secondary | ICD-10-CM | POA: Insufficient documentation

## 2016-09-27 DIAGNOSIS — R937 Abnormal findings on diagnostic imaging of other parts of musculoskeletal system: Secondary | ICD-10-CM | POA: Insufficient documentation

## 2016-09-27 DIAGNOSIS — R938 Abnormal findings on diagnostic imaging of other specified body structures: Secondary | ICD-10-CM | POA: Diagnosis not present

## 2016-09-27 DIAGNOSIS — R932 Abnormal findings on diagnostic imaging of liver and biliary tract: Secondary | ICD-10-CM | POA: Diagnosis not present

## 2016-09-27 MED ORDER — IOPAMIDOL (ISOVUE-300) INJECTION 61%
INTRAVENOUS | Status: AC
  Filled 2016-09-27: qty 75

## 2016-09-27 MED ORDER — IOPAMIDOL (ISOVUE-300) INJECTION 61%
INTRAVENOUS | Status: AC
  Filled 2016-09-27: qty 100

## 2016-09-27 MED ORDER — TECHNETIUM TC 99M MEDRONATE IV KIT
25.0000 | PACK | Freq: Once | INTRAVENOUS | Status: AC | PRN
  Administered 2016-09-27: 25 via INTRAVENOUS

## 2016-09-27 MED ORDER — IOPAMIDOL (ISOVUE-300) INJECTION 61%
75.0000 mL | Freq: Once | INTRAVENOUS | Status: AC | PRN
  Administered 2016-09-27: 75 mL via INTRAVENOUS

## 2016-09-27 MED ORDER — IOPAMIDOL (ISOVUE-300) INJECTION 61%
INTRAVENOUS | Status: AC
  Filled 2016-09-27: qty 30

## 2016-09-29 ENCOUNTER — Ambulatory Visit (HOSPITAL_BASED_OUTPATIENT_CLINIC_OR_DEPARTMENT_OTHER): Payer: Medicare PPO | Admitting: Hematology

## 2016-09-29 ENCOUNTER — Ambulatory Visit (HOSPITAL_BASED_OUTPATIENT_CLINIC_OR_DEPARTMENT_OTHER): Payer: Medicare PPO

## 2016-09-29 ENCOUNTER — Encounter: Payer: Self-pay | Admitting: Hematology

## 2016-09-29 ENCOUNTER — Ambulatory Visit: Payer: Medicare PPO | Admitting: Radiation Oncology

## 2016-09-29 ENCOUNTER — Telehealth: Payer: Self-pay | Admitting: Hematology

## 2016-09-29 ENCOUNTER — Other Ambulatory Visit (HOSPITAL_BASED_OUTPATIENT_CLINIC_OR_DEPARTMENT_OTHER): Payer: Medicare PPO

## 2016-09-29 VITALS — BP 127/72 | HR 65 | Temp 97.7°F | Resp 17 | Ht 65.0 in | Wt 201.0 lb

## 2016-09-29 DIAGNOSIS — C50511 Malignant neoplasm of lower-outer quadrant of right female breast: Secondary | ICD-10-CM

## 2016-09-29 DIAGNOSIS — Z510 Encounter for antineoplastic radiation therapy: Secondary | ICD-10-CM | POA: Diagnosis not present

## 2016-09-29 DIAGNOSIS — D519 Vitamin B12 deficiency anemia, unspecified: Secondary | ICD-10-CM | POA: Diagnosis not present

## 2016-09-29 DIAGNOSIS — D630 Anemia in neoplastic disease: Secondary | ICD-10-CM

## 2016-09-29 DIAGNOSIS — Z170 Estrogen receptor positive status [ER+]: Secondary | ICD-10-CM | POA: Diagnosis not present

## 2016-09-29 DIAGNOSIS — C7951 Secondary malignant neoplasm of bone: Secondary | ICD-10-CM

## 2016-09-29 DIAGNOSIS — E538 Deficiency of other specified B group vitamins: Secondary | ICD-10-CM

## 2016-09-29 LAB — CBC WITH DIFFERENTIAL/PLATELET
BASO%: 0.3 % (ref 0.0–2.0)
Basophils Absolute: 0 10*3/uL (ref 0.0–0.1)
EOS%: 0.5 % (ref 0.0–7.0)
Eosinophils Absolute: 0 10*3/uL (ref 0.0–0.5)
HCT: 32.5 % — ABNORMAL LOW (ref 34.8–46.6)
HGB: 10.8 g/dL — ABNORMAL LOW (ref 11.6–15.9)
LYMPH%: 28 % (ref 14.0–49.7)
MCH: 29.7 pg (ref 25.1–34.0)
MCHC: 33.2 g/dL (ref 31.5–36.0)
MCV: 89.3 fL (ref 79.5–101.0)
MONO#: 0.5 10*3/uL (ref 0.1–0.9)
MONO%: 11.5 % (ref 0.0–14.0)
NEUT#: 2.4 10*3/uL (ref 1.5–6.5)
NEUT%: 59.7 % (ref 38.4–76.8)
PLATELETS: 277 10*3/uL (ref 145–400)
RBC: 3.64 10*6/uL — AB (ref 3.70–5.45)
RDW: 12.9 % (ref 11.2–14.5)
WBC: 4 10*3/uL (ref 3.9–10.3)
lymph#: 1.1 10*3/uL (ref 0.9–3.3)

## 2016-09-29 LAB — COMPREHENSIVE METABOLIC PANEL
ALT: 71 U/L — ABNORMAL HIGH (ref 0–55)
ANION GAP: 6 meq/L (ref 3–11)
AST: 75 U/L — ABNORMAL HIGH (ref 5–34)
Albumin: 3.5 g/dL (ref 3.5–5.0)
Alkaline Phosphatase: 83 U/L (ref 40–150)
BUN: 8.5 mg/dL (ref 7.0–26.0)
CHLORIDE: 103 meq/L (ref 98–109)
CO2: 26 meq/L (ref 22–29)
Calcium: 8.4 mg/dL (ref 8.4–10.4)
Creatinine: 0.7 mg/dL (ref 0.6–1.1)
EGFR: >90 mL/min/{1.73_m2} (ref >90)
GLUCOSE: 81 mg/dL (ref 70–140)
Potassium: 3.6 mEq/L (ref 3.5–5.1)
SODIUM: 135 meq/L — AB (ref 136–145)
TOTAL PROTEIN: 6.7 g/dL (ref 6.4–8.3)
Total Bilirubin: 0.59 mg/dL (ref 0.20–1.20)

## 2016-09-29 MED ORDER — CYANOCOBALAMIN 1000 MCG/ML IJ SOLN
1000.0000 ug | Freq: Once | INTRAMUSCULAR | Status: AC
  Administered 2016-09-29: 1000 ug via INTRAMUSCULAR

## 2016-09-29 MED ORDER — DENOSUMAB 120 MG/1.7ML ~~LOC~~ SOLN
120.0000 mg | Freq: Once | SUBCUTANEOUS | Status: AC
  Administered 2016-09-29: 120 mg via SUBCUTANEOUS
  Filled 2016-09-29: qty 1.7

## 2016-09-29 NOTE — Progress Notes (Signed)
[place]  [phone] Fax:([phone]) [phone]  Clinic follow up Note   Patient Care Team: [name], MD as PCP - General Truitt Merle, MD as Consulting Physician (Hematology)  09/29/2016  CHIEF COMPLAINTS Follow up breast cancer     Breast cancer of lower-outer quadrant of right female breast ([place])   09/03/2008 Initial Diagnosis    right breast cancer, T2N1Mo, stage IIA, ER+, PR+, HER2+ (ration 2.07). Pt declined surgery or other treatment.        05/20/2011 Tumor Marker    right breast mass biopsy showed IDA, ER+/PR+/HER2- (ratio 1.37)      09/04/2011 Cancer Staging    PET scan showed no distant mets      10/08/2014 Progression     lumbar spine MRI showed multiple bone metastasis , highly suspicious for  metastatic disease.      12/13/2014 Pathology Results    Right breast needle biopsy showed invasive ductal carcinoma, ER 100% positive, PR 2% positive, HER-2 negative with ratio 1.45 and copy number 2.25      12/14/2014 -  Anti-estrogen oral therapy    Arimidex 13m daily, Ibrance added on 07/29/2015, stopped in 08/2015 after loss of f/u, and restarted on 01/10/2016      07/17/2015 Imaging    PET scan showed overall similar appearance of multifocal right breast cancer with extensive bone metastasis. Increased size of right pleural effusion, indeterminate for malignant involvement of the pleural fluids.      01/20/2016 Imaging    CT CAP with contrast showed small bilateral pleural effusion, right greater than left, similar osseous metastasis, no evidence of extraosseous metastasis. Bone scan showed no areas of suspicious uptake.        HISTORY OF PRESENTING ILLNESS (10/30/2014):  Margaret LSILVIE OBREMSKI75y.o. female  Was referred by her primary care physician to discuss the management probable metastatic breast cancer.  She was initially diagnosed with right breast cancer in November 2009. She underwent a right breast biopsy of a lesion in the 6:00  position that showed a carcinoma in situ associated with microscopic foci of microinvasion. In December of 2009, she underwent a right axillary lymph node biopsy on 09/24/2008 which showed metastatic ductal carcinoma. (Case No: [id number] The tumor was ER 100% positive, PR 0% negative, Ki67 - 22%. 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 The ratio of HER-2:CEP 17 was 2.07.  She decided to pursue nature therapy by diet and exercise, and did not have surgery and other tranditional therapy for her breast cancer.   In May 2011 she underwent a mammogram that showed a 3.8 cm invasive duct carcinoma in the subareolar region of the right breast which had increased since November 2009.  The tumor was ER  99% positive,  PR 4% positive, HER-2 negative ( ratio 1.37).  She was seen by Dr. EEstella Huskand Dr. NLucia Gaskins  However, she failed multiple appointments and sought alternative medical care in [place] This involved putting something on her right breast that caused some scarring.  She noticed the enlarged right breast mass and nipple inversion about one year ago. No pain, but some skin change. She developed left low back/buttock and hip and  pain in Nov 2015, was seen by PCP Dr. JJenny Reichmannwho prescribed vicodin and tramadol and pain improved with meds. Due to the persistent pain, she underwent a lumber MRI  On 10/08/2014, which showed multiple bone metastasis.  She run out pain medication about a week ago,  But did not  call for refill. She states her pain is not bad , but she appears to be uncomfortable when she stands up and walks.  She otherwise feels well overall,  Has good appetite and energy level. She lives alone and still functions well.  She comes in with her niece today.   Current therapy:  1.Anastrozole 1 mg once daily started on 12/10/2014 2. Ibrance added on 07/29/15, stopped in Nov 2016, restarted on 01/10/2016 3. Xgeva started on 08/15/2015, changed to every 3 months after 09/01/2016   INTERIM  HISTORY: [name] returns for follow-up. She is doing well overall. She reports worsening left front headache around her left eyes, she rates her pain some time 8-9/10, although most of time it's tolerable. She has postponed her radiation to the left orbital, but it has been finally scheduled to start the first week of January. She still has a left peripheral vision deficits, no other new change. She denies any other significant pain, or other new symptoms. She is being compliant with anastrozole and Ibrance, tolerating well.  MEDICAL HISTORY:  Past Medical History:  Diagnosis Date  . 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 ASTHMA 01/31/2008  . Breast cancer [facility]) dx 2009   right breast  . BREAST CANCER, HX OF 11/14/2009  . BRONCHITIS, ACUTE 01/31/2008  . Cough 10/17/2009  . FATIGUE 11/14/2009  . GANGLION CYST 05/01/2009  . HYPERLIPIDEMIA 05/21/2007  . HYPERTENSION 05/05/2007  . LOW BACK PAIN 05/21/2007  . MENOPAUSAL DISORDER 05/22/2010  . Pain in joint, lower leg 05/10/2008  . RASH-NONVESICULAR 05/01/2009  . SHOULDER PAIN, LEFT 11/14/2009  . 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 VITAMIN B12 DEFICIENCY 05/23/2010    SURGICAL HISTORY: Past Surgical History:  Procedure Laterality Date  . OTHER SURGICAL HISTORY     fibroid removal  . removal of neck lump     benigh 1986    SOCIAL HISTORY: History   Social History  . Marital Status: Widowed    Spouse Name: N/A    Number of Children: 8, lives in [place].   . Years of Education: N/A   Occupational History  . Not on file.   Social History Main Topics  . Smoking status: Never Smoker   . Smokeless tobacco: Not on file  . Alcohol Use: No  . Drug Use: No  . Sexual Activity: Not on file   Other Topics Concern  . Not on file   Social History Narrative    FAMILY HISTORY: Family History  Problem Relation Age of Onset  . Hypertension Father   . Heart disease Father   . Asthma Other   . Diabetes Other   . Hypertension Other   . Cancer Paternal  Aunt     leukemia ?  Paternal aunt had cancer, unknown type. No other family history of malignancy   ALLERGIES:  is allergic to tomato.  MEDICATIONS:  Current Outpatient Prescriptions on File Prior to Visit  Medication Sig Dispense Refill  . anastrozole (ARIMIDEX) 1 MG tablet Take 1 tablet (1 mg total) by mouth daily. 30 tablet 9  . Ascorbic Acid (VITAMIN C) 100 MG CHEW Chew 1 tablet (100 mg total) by mouth every morning. 90 each 3  . calcium carbonate 1250 MG capsule Take 1,250 mg by mouth 2 (two) times daily with a meal. Not sure of type or dose    . clotrimazole (GYNE-LOTRIMIN 3) 2 % vaginal cream Apply to affected bilaterall axilla sites as needed for rash. 21 g 1  . Cyanocobalamin (B-12 COMPLIANCE  INJECTION IJ) Inject as directed every 30 (thirty) days.    . palbociclib (IBRANCE) 125 MG capsule Take 1 capsule (125 mg total) by mouth daily with breakfast. Take whole with food. Take for 21 days. Repeat every 28 days 21 capsule 0   No current facility-administered medications on file prior to visit.   ; REVIEW OF SYSTEMS:   Constitutional: Denies fevers, chills or abnormal night sweats Eyes: Denies blurriness of vision, double vision or watery eyes Ears, nose, mouth, throat, and face: Denies mucositis or sore throat Respiratory: Denies cough, dyspnea or wheezes Cardiovascular: Denies palpitation, chest discomfort or lower extremity swelling Gastrointestinal:  Denies nausea, heartburn or change in bowel habits Skin: Denies abnormal skin rashes Lymphatics: Denies new lymphadenopathy or easy bruising Neurological:Denies numbness, tingling or new weaknesses Behavioral/Psych: Mood is stable, no new changes  All other systems were reviewed with the patient and are negative.  PHYSICAL EXAMINATION: ECOG PERFORMANCE STATUS: 1 BP 127/72 (BP Location: Left Arm, Patient Position: Sitting)   Pulse 65   Temp 97.7 F (36.5 C) (Oral)   Resp 17   Ht _0  (1.651 m)   Wt 201 lb (91.2 kg)    SpO2 100%   BMI 33.45 kg/m   GENERAL:alert, no distress and comfortable SKIN: skin color, texture, turgor are normal, no rashes or significant lesions EYES: normal, conjunctiva are pink and non-injected, sclera clear OROPHARYNX:no exudate, no erythema and lips, buccal mucosa, and tongue normal  NECK: supple, thyroid normal size, non-tender, without nodularity LYMPH:  no palpable lymphadenopathy in the cervical, axillary or inguinal LUNGS: clear to auscultation and percussion with normal breathing effort, slightly decreased breath sounds at the bottom of right lung. HEART: regular rate & rhythm and no murmurs and no lower extremity edema ABDOMEN:abdomen soft, non-tender and normal bowel sounds Musculoskeletal:no cyanosis of digits and no clubbing  PSYCH: alert & oriented x 3 with fluent speech NEURO: no focal motor/sensory deficits. Her left eye ball has limited range of movement  Breasts: Breast inspection showed  Significantly enlarged right breast, compared to left breast, (+) skin pigmentations , nipple inversion and a dry ulcer/ scar below the nipple, no discharge.  The right breast is softer than before, nontender , no discrete mass. Palpation of the  Left breasts and axilla revealed no obvious mass that I could appreciate.   LABORATORY DATA:  I have reviewed the data as listed CBC Latest Ref Rng & Units 09/29/2016 09/01/2016 08/03/2016  WBC 3.9 - 10.3 10e3/uL 4.0 3.2(L) 3.3(L)  Hemoglobin 11.6 - 15.9 g/dL 10.8(L) 10.7(L) 10.4(L)  Hematocrit 34.8 - 46.6 % 32.5(L) 32.8(L) 32.2(L)  Platelets 145 - 400 10e3/uL 277 216 274    CMP Latest Ref Rng & Units 09/29/2016 09/01/2016 08/03/2016  Glucose 70 - 140 mg/dl 81 86 83  BUN 7.0 - 26.0 mg/dL 8.5 9.4 9.9  Creatinine 0.6 - 1.1 mg/dL 0.7 0.8 0.7  Sodium 136 - 145 mEq/L 135(L) 138 140  Potassium 3.5 - 5.1 mEq/L 3.6 4.1 4.1  Chloride 96 - 112 mEq/L - - -  CO2 22 - 29 mEq/L _1 Calcium 8.4 - 10.4 mg/dL 8.4 9.1 9.3  Total Protein 6.4  - 8.3 g/dL 6.7 6.9 6.9  Total Bilirubin 0.20 - 1.20 mg/dL 0.59 0.61 0.30  Alkaline Phos 40 - 150 U/L 83 70 65  AST 5 - 34 U/L 75(H) 50(H) 30  ALT 0 - 55 U/L 71(H) 39 17   CA27.29: 06/30/2012: 28 10/30/2014: 355 08/15/2015: 424 12/27/2014: 414  05/05/2016: 416.1 06/10/2016: 332.4 07/01/2016: 348.8 08/03/2016: 388.1 09/01/2016: 321.1   PATHOLOGY REPORT  RIGHT BREAST MASS, 6 O'CLOCK, NEEDLE CORE BIOPSIES: 09/03/2008 MICROSCOPIC FOCUS OF DUCTAL CARCINOMA IN SITU ASSOCIATED WITH MICROSCOPIC FOCUS OF MICROINVASION. SEE COMMET.  LYMPH NODE, RIGHT AXILLARY, NEEDLE CORE BIOPSIES: METASTATIC DUCTAL CARCINOMA.  09/24/2008  COMMENT There are needle core biopsies of lymph node showing partial replacement by metastatic adenocarcinoma consistent with metastasis from a breast primary, metastatic ductal carcinoma. Since the breast prognostic profile is not performed on the previous material because of limited invasive tumor ([id number]), the breast prognostic profile will be performed on the tumor in the lymph node and reported in a separate report. Interpretation HER-2/NEU BY CISH -- SHOWS AMPLIFICATION BY CISH ANALYSIS. THE RATIO OF HER-2: CEP 17 SIGNALS WAS 2.07  Diagnosis 05/20/2011 Breast, right, needle core biopsy - INVASIVE DUCTAL CARCINOMA, SEE COMMENT. - LYMPHOVASCULAR INVASION PRESENT. Estrogen Receptor (Negative, <1%): 99%, STRONG STAINING INTENSITY Progesterone Receptor (Negative, <1%): 4%, STRONG HER-2/NEU BY CISH - NO AMPLIFICATION OF HER-2 DETECTED. THE RATIO OF HER-2: CEP 17 SIGNALS WAS 1.38.  Breast, right, needle core biopsy, 6:30 o'clock 12/13/2014 - INVASIVE MAMMARY CARCINOMA. - SEE MICROSCOPIC DESCRIPTION. Microscopic Comment The features favor Grade II invasive ductal carcinoma. A breast prognostic profile will be performed. Dr. Donato Heinz agrees. The results were called to [facility] on 12/16/14. (JDP:ds 12/16/14) ADDENDUM:  Immunohistochemistry for E-cadherin is performed and shows diffuse strong positivity consistent with invasive ductal carcinoma. Estrogen Receptor: 100%, POSITIVE, STRONG STAINING INTENSITY Progesterone Receptor: 2%, POSITIVE, STRONG STAINING INTENSITY Proliferation Marker Ki67: 27% HER-2/NEU BY CISH - NEGATIVE. RESULT RATIO OF HER2: CEP 17 SIGNALS 1.45 AVERAGE HER2 COPY NUMBER PER CELL 2.25  Diagnosis 08/01/2015 PLEURAL FLUID, RIGHT(SPECIMEN 1 OF 1 COLLECTED 08/01/15): METASTATIC CARCINOMA CONSISTENT WITH BREAST PRIMARY.   RADIOGRAPHIC STUDIES: I have personally reviewed the radiological images as listed and agreed with the findings in the report.  PET 07/17/2015 IMPRESSION: 1. Suspected active diverticulitis along the sigmoid colon, with focal mesenteric stranding and a small locule of gas which is either within an inflamed diverticulum or a micro perforation. No overt free air. 2. Overall similar appearance of multifocal right breast cancer with extensive osseous metastatic disease. The right axillary lymph nodes are less notable that there is a borderline hypermetabolic left axillary lymph node currently. 3. Increasing size of the right pleural effusion, now moderate. Appearance indeterminate for malignant involvement of the pleural fluid. 4. Faintly increased activity in a right infrahilar lymph node which could represent early metastatic spread. 5. Other imaging findings of potential clinical significance: Chronic left maxillary sinusitis. Low-density blood pool suggests anemia. Left adrenal adenoma. These results will be called to the ordering clinician or representative by the Radiologist Assistant, and communication documented in the PACS or zVision Dashboard.  Brain MRI with and without contrast on 06/27/2015, at [facility] -In determinate, ill-defined soft tissue infiltration in the left orbit with questionable enhancement,  further evaluation with CT orbits with contrast is recommended to the cyst in ruling out metastasis. -No intracranial metastasis -Diffusely hypoechoic intense bone marrow signal throughout the skull base   Bone scan 05/31/2016 IMPRESSION: New increased up take in the ribs bilaterally worrisome for metastatic disease. Stable increased uptake in the right fronto poor parietal bone.  A bilateral rib series is recommended.   CT chest, abdomen and pelvis with contrast 05/31/2016 IMPRESSION: 1. Similar fit appearance of widespread it treated osseous metastatic disease, no progression. No findings of  new extra osseous metastatic disease. 2. Continued skin thickening and edema in the right breast. The edema in the right breast and tracking in the right chest wall is slightly increased compared to prior and the lateral soft tissue density in the right lateral breast is stable. 3. Similar small bilateral pleural effusions with some atelectasis at the right lung base possibly tethered to the parietal pleura. 4. Mild coronary artery atherosclerosis and atherosclerotic calcification of the aortic arch. 5. Airway thickening is present, suggesting bronchitis or reactive airways disease.  MR orbits w/wo contrast 07/22/2016  IMPRESSION: 1. Diffuse metastasis within the left orbit intraconal space, which likely invades the left extraocular muscles. No abnormal enhancement of the left optic nerve to strongly indicate nerve invasion although there does appear to be abnormal T2 signal within the substance of the L CN2, such as due to neuritis. The left globe appears spared. 2. No other soft tissue metastasis identified, an the right orbit appears normal. Evidence of osseous metastatic disease in the cervical spine and possibly also the calvarium.    ASSESSMENT & PLAN:   Mrs. Manwarren is a 75 y.o. African-American female, who was diagnosed with node positive right breast  Invasive  adenocarcinoma in  2009, untreated,  Now presents with low back pain and lumbar spine MRI is highly suspicious for bone metastasis.  1. Right breast IDA,  Strongly ER positive,  PR weakly positive,  HER-2 positive on first biopsy , but negative on second and third  biopsy 3 and 6 years later, with diffuse bone metastatic and right pleura, T4NxM1 stage IV  -I previously discussed her initial PET scan results with her and her daughter. 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 -she knows that her disease at this stage is incurable. The goal of treatment is palliative and to prolong her life. -she is on first line anastrozole, Leslee Home was added on in October 2016 due to slight disease progression. She was not very compliant, off Ibrance for 3-4 months and restarted in 01/2016. -I discussed her restaging CT and bone scan from 05/31/2016, which showed diffuse similar appearing bone lesions, no other new metastasis. Bone scan showed mild bilateral rib uptake, but no corresponding findings on the CT. I think she has stable disease overall. -She is clinically doing well, tolerating Ibrance and anastrozole well, we'll continue. Patient is more compliant lately, I strongly encouraged her to continue treatment. -I reviewed her restaging CT scan and bone scan from 09/27/2016, which showed stable bone mets. CT scan showed heterogeneous perfusion of the liver indeterminate, I recommend to obtain a liver MRI for further evaluation. -Lab results reviewed, she has mild neutropenia, stable anemia, and mild transaminitis, we'll continue monitoring -Due to her coming radiation, I'll hold Ibrance for now.   2. Left orbit bone metastasis  -this was found on her brain MRI in 06/2015 when she had heaache, she has issues with left eye ball movement, was evaluated by Dr. Emilio Math at [facility] -this is likely a metastatic lesion from her breast cancer -her headache has gotten worse lately  -I discussed her  orbital MRI scan findings, which showed diffuse metastases to status is within the left orbital with invading the left extraocular muscles. No optical nerve involvement. -She was seen by a radiation oncologist Dr. Tammi Klippel, who offers 5 session of SBRT to her left orbit bone metastasis, which is scheduled for early Jan   3. Low back/ hip pain,  Secondary to metastatic cancer to bones  -  this has resolved since she started anastrozole -continue Xgeva every 3 months   4. Anemia -Likely secondary to her breast cancer, bone metastases and B12 deficiency -Slightly improved after B12 injections. Continue B12 monthly for now -No need for transfusion. We'll follow up closely.  5. Mild transaminitis -She has slightly increased liver enzymes, new in the past month -CT scan on 09/27/16 showed heterogeneous perfusion in the liver, I recommend liver MRI for further evaluation.  Plan: -Xgeva and B12 injection today, and change Xgeva to ever 3 month after  -continue anastrozole, and hold Ibrance for now, until she completes SBRT to left orbital bone mets  -liver MRI in 2-3 weeks  -I'll see her back in 4 weeks with lab and B12 injection, next Xgeva due in 12/2016   All questions were answered. The patient knows to call the clinic with any problems, questions or concerns.  I spent 20 minutes counseling the patient face to face. The total time spent in the appointment was 25 minutes and more than 50% was on counseling.     Truitt Merle, MD 09/29/2016

## 2016-09-29 NOTE — Telephone Encounter (Signed)
Appointments scheduled per 09/29/16 los.  A copy of the appointment schedule and AVS report was given to the patient, per 09/29/16 los. °

## 2016-09-29 NOTE — Patient Instructions (Signed)
Denosumab injection What is this medicine? DENOSUMAB (den oh sue mab) slows bone breakdown. Prolia is used to treat osteoporosis in women after menopause and in men. Delton See is used to prevent bone fractures and other bone problems caused by cancer bone metastases. Delton See is also used to treat giant cell tumor of the bone. COMMON BRAND NAME(S): Prolia, XGEVA What should I tell my health care provider before I take this medicine? They need to know if you have any of these conditions: -dental disease -eczema -infection or history of infections -kidney disease or on dialysis -low blood calcium or vitamin D -malabsorption syndrome -scheduled to have surgery or tooth extraction -taking medicine that contains denosumab -thyroid or parathyroid disease -an unusual reaction to denosumab, other medicines, foods, dyes, or preservatives -pregnant or trying to get pregnant -breast-feeding How should I use this medicine? This medicine is for injection under the skin. It is given by a health care professional in a hospital or clinic setting. If you are getting Prolia, a special MedGuide will be given to you by the pharmacist with each prescription and refill. Be sure to read this information carefully each time. For Prolia, talk to your pediatrician regarding the use of this medicine in children. Special care may be needed. For Delton See, talk to your pediatrician regarding the use of this medicine in children. While this drug may be prescribed for children as young as 13 years for selected conditions, precautions do apply. What if I miss a dose? It is important not to miss your dose. Call your doctor or health care professional if you are unable to keep an appointment. What may interact with this medicine? Do not take this medicine with any of the following medications: -other medicines containing denosumab This medicine may also interact with the following medications: -medicines that suppress the immune  system -medicines that treat cancer -steroid medicines like prednisone or cortisone What should I watch for while using this medicine? Visit your doctor or health care professional for regular checks on your progress. Your doctor or health care professional may order blood tests and other tests to see how you are doing. Call your doctor or health care professional if you get a cold or other infection while receiving this medicine. Do not treat yourself. This medicine may decrease your body's ability to fight infection. You should make sure you get enough calcium and vitamin D while you are taking this medicine, unless your doctor tells you not to. Discuss the foods you eat and the vitamins you take with your health care professional. See your dentist regularly. Brush and floss your teeth as directed. Before you have any dental work done, tell your dentist you are receiving this medicine. Do not become pregnant while taking this medicine or for 5 months after stopping it. Women should inform their doctor if they wish to become pregnant or think they might be pregnant. There is a potential for serious side effects to an unborn child. Talk to your health care professional or pharmacist for more information. What side effects may I notice from receiving this medicine? Side effects that you should report to your doctor or health care professional as soon as possible: -allergic reactions like skin rash, itching or hives, swelling of the face, lips, or tongue -breathing problems -chest pain -fast, irregular heartbeat -feeling faint or lightheaded, falls -fever, chills, or any other sign of infection -muscle spasms, tightening, or twitches -numbness or tingling -skin blisters or bumps, or is dry, peels, or red -slow  healing or unexplained pain in the mouth or jaw -unusual bleeding or bruising Side effects that usually do not require medical attention (report to your doctor or health care professional  if they continue or are bothersome): -muscle pain -stomach upset, gas Where should I keep my medicine? This medicine is only given in a clinic, doctor's office, or other health care setting and will not be stored at home.  2017 Elsevier/Gold Standard (2015-10-30 10:06:55)  Cyanocobalamin, Vitamin B12 injection What is this medicine? CYANOCOBALAMIN (sye an oh koe BAL a min) is a man made form of vitamin B12. Vitamin B12 is used in the growth of healthy blood cells, nerve cells, and proteins in the body. It also helps with the metabolism of fats and carbohydrates. This medicine is used to treat people who can not absorb vitamin B12. This medicine may be used for other purposes; ask your health care provider or pharmacist if you have questions. COMMON BRAND NAME(S): B-12 Compliance Kit, B-12 Injection Kit, Cyomin, LA-12, Nutri-Twelve, Physicians EZ Use B-12, Primabalt What should I tell my health care provider before I take this medicine? They need to know if you have any of these conditions: -kidney disease -Leber's disease -megaloblastic anemia -an unusual or allergic reaction to cyanocobalamin, cobalt, other medicines, foods, dyes, or preservatives -pregnant or trying to get pregnant -breast-feeding How should I use this medicine? This medicine is injected into a muscle or deeply under the skin. It is usually given by a health care professional in a clinic or doctor's office. However, your doctor may teach you how to inject yourself. Follow all instructions. Talk to your pediatrician regarding the use of this medicine in children. Special care may be needed. Overdosage: If you think you have taken too much of this medicine contact a poison control center or emergency room at once. NOTE: This medicine is only for you. Do not share this medicine with others. What if I miss a dose? If you are given your dose at a clinic or doctor's office, call to reschedule your appointment. If you give your  own injections and you miss a dose, take it as soon as you can. If it is almost time for your next dose, take only that dose. Do not take double or extra doses. What may interact with this medicine? -colchicine -heavy alcohol intake This list may not describe all possible interactions. Give your health care provider a list of all the medicines, herbs, non-prescription drugs, or dietary supplements you use. Also tell them if you smoke, drink alcohol, or use illegal drugs. Some items may interact with your medicine. What should I watch for while using this medicine? Visit your doctor or health care professional regularly. You may need blood work done while you are taking this medicine. You may need to follow a special diet. Talk to your doctor. Limit your alcohol intake and avoid smoking to get the best benefit. What side effects may I notice from receiving this medicine? Side effects that you should report to your doctor or health care professional as soon as possible: -allergic reactions like skin rash, itching or hives, swelling of the face, lips, or tongue -blue tint to skin -chest tightness, pain -difficulty breathing, wheezing -dizziness -red, swollen painful area on the leg Side effects that usually do not require medical attention (report to your doctor or health care professional if they continue or are bothersome): -diarrhea -headache This list may not describe all possible side effects. Call your doctor for medical advice about  side effects. You may report side effects to FDA at 1-800-FDA-1088. Where should I keep my medicine? Keep out of the reach of children. Store at room temperature between 15 and 30 degrees C (59 and 85 degrees F). Protect from light. Throw away any unused medicine after the expiration date. NOTE: This sheet is a summary. It may not cover all possible information. If you have questions about this medicine, talk to your doctor, pharmacist, or health care  provider.  2017 Elsevier/Gold Standard (2008-01-08 22:10:20)

## 2016-09-30 ENCOUNTER — Telehealth: Payer: Self-pay | Admitting: *Deleted

## 2016-09-30 NOTE — Telephone Encounter (Signed)
Pt reports she took tramadol last night for H/A and has had "upset stomach" last night.  She got Xgeva yesterday and asks if her upset stomach is from the [place] or tramadol?  She has never had this side effect from [place] before and has rarely taken tramadol.   Informed pt it could be side effect from either med or both but since she has never had upset stomach from Delton See it is likely r/t the Tramadol.  Suggested pt take tramadol with food next time.  Pt verbalized understanding and says she will try to stick to just taking tylenol for h/a.

## 2016-10-01 ENCOUNTER — Ambulatory Visit: Payer: Medicare PPO | Admitting: Radiation Oncology

## 2016-10-04 ENCOUNTER — Encounter: Payer: Self-pay | Admitting: Hematology

## 2016-10-06 ENCOUNTER — Ambulatory Visit: Payer: Medicare PPO | Admitting: Radiation Oncology

## 2016-10-08 ENCOUNTER — Ambulatory Visit: Payer: Medicare PPO | Admitting: Radiation Oncology

## 2016-10-10 ENCOUNTER — Ambulatory Visit (HOSPITAL_COMMUNITY)
Admission: RE | Admit: 2016-10-10 | Discharge: 2016-10-10 | Disposition: A | Discharge status: Home / Self Care | Payer: Medicare PPO | Source: Ambulatory Visit | Attending: Hematology | Admitting: Hematology

## 2016-10-10 DIAGNOSIS — Z170 Estrogen receptor positive status [ER+]: Secondary | ICD-10-CM | POA: Insufficient documentation

## 2016-10-10 DIAGNOSIS — C50511 Malignant neoplasm of lower-outer quadrant of right female breast: Secondary | ICD-10-CM | POA: Diagnosis present

## 2016-10-10 DIAGNOSIS — C7951 Secondary malignant neoplasm of bone: Secondary | ICD-10-CM | POA: Diagnosis not present

## 2016-10-10 DIAGNOSIS — Z923 Personal history of irradiation: Secondary | ICD-10-CM | POA: Insufficient documentation

## 2016-10-10 MED ORDER — GADOBENATE DIMEGLUMINE 529 MG/ML IV SOLN
19.0000 mL | Freq: Once | INTRAVENOUS | Status: AC | PRN
  Administered 2016-10-10: 19 mL via INTRAVENOUS

## 2016-10-11 DIAGNOSIS — Z510 Encounter for antineoplastic radiation therapy: Secondary | ICD-10-CM | POA: Diagnosis present

## 2016-10-11 DIAGNOSIS — C7951 Secondary malignant neoplasm of bone: Secondary | ICD-10-CM | POA: Diagnosis not present

## 2016-10-11 DIAGNOSIS — Z79899 Other long term (current) drug therapy: Secondary | ICD-10-CM | POA: Diagnosis not present

## 2016-10-11 DIAGNOSIS — C7952 Secondary malignant neoplasm of bone marrow: Secondary | ICD-10-CM | POA: Diagnosis not present

## 2016-10-13 ENCOUNTER — Ambulatory Visit
Admission: RE | Admit: 2016-10-13 | Discharge: 2016-10-13 | Disposition: A | Discharge status: Home / Self Care | Payer: Medicare PPO | Source: Ambulatory Visit | Attending: Radiation Oncology | Admitting: Radiation Oncology

## 2016-10-13 ENCOUNTER — Encounter: Payer: Self-pay | Admitting: Radiation Oncology

## 2016-10-13 VITALS — BP 172/120 | HR 70 | Temp 98.5°F

## 2016-10-13 DIAGNOSIS — C7949 Secondary malignant neoplasm of other parts of nervous system: Secondary | ICD-10-CM

## 2016-10-13 DIAGNOSIS — Z510 Encounter for antineoplastic radiation therapy: Secondary | ICD-10-CM | POA: Diagnosis not present

## 2016-10-13 NOTE — Progress Notes (Addendum)
1615 Patient arrived ambulatory using a cane with Margaret Rubio at side to nursing room 10, s/p SRS left orbital  x1  1616  Vital signs taken BP (!) 172/120   Pulse 70   Temp 98.5 F (36.9 C) (Oral) sitting repeated B/P 157/91 at 1620. Daughter in the room with her mother.  Monitor for 15 minutes complained of headache 8/10 stated she did not eat before coming in for her procedure today.  Dr. Tammi Klippel aware of her headache told to take Tylenol once she eats.   No further nausea.  no visual changes.patient knows to call for any unusual symptoms, increased head ache that won't go away, increased fever > 100.5, increased vomiting, vision changes.  1635 To car with daughter able to ambulatory using her cane, no further nausea.  Aware of the next appointment on Friday 10-15-16 at 1600.

## 2016-10-15 ENCOUNTER — Ambulatory Visit
Admission: RE | Admit: 2016-10-15 | Discharge: 2016-10-15 | Disposition: A | Discharge status: Home / Self Care | Payer: Medicare PPO | Source: Ambulatory Visit | Attending: Radiation Oncology | Admitting: Radiation Oncology

## 2016-10-15 DIAGNOSIS — Z510 Encounter for antineoplastic radiation therapy: Secondary | ICD-10-CM | POA: Diagnosis not present

## 2016-10-15 DIAGNOSIS — C7949 Secondary malignant neoplasm of other parts of nervous system: Secondary | ICD-10-CM

## 2016-10-15 NOTE — Progress Notes (Signed)
  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  It showed 3-dimensional radiation distributions overlaid onto the planning CT/MRI image set.  The [facility] for the target structures as well as the organs at risk were reviewed. The documentation of the 3D plan and dosimetry are filed in the radiation oncology EMR.  NARRATIVE:  Margaret Rubio was brought to the TrueBeam stereotactic radiation treatment machine and placed supine on the CT couch. The head frame was applied, and the patient was set up for stereotactic radiosurgery.  Neurosurgery was present for the set-up and delivery  SIMULATION VERIFICATION:  In the couch zero-angle position, the patient underwent Exactrac imaging using the Brainlab system with orthogonal KV images.  These were carefully aligned and repeated to confirm treatment position for each of the isocenters.  The Exactrac snap film verification was repeated at each couch angle.  PROCEDURE: Margaret Rubio received stereotactic radiosurgery to the following targets: Right orbital metastasis target was treated using 5 Rapid Arc VMAT Beams to a prescription dose of 5 Gy to be repeated for 5 fractions to 25 Gy total.  ExacTrac registration was performed for each couch angle.  The 100% isodose line was prescribed.  6 MV X-rays were delivered in the flattening filter free beam mode.  STEREOTACTIC TREATMENT MANAGEMENT:  Following delivery, the patient was transported to nursing in stable condition and monitored for possible acute effects.  Vital signs  were recorded. The patient tolerated treatment without significant acute effects, and was discharged to home in stable condition.    PLAN: Continue treatment for total of 5 fractions then follow-up in one month.  ________________________________  Sheral Apley. Tammi Klippel, M.D.

## 2016-10-15 NOTE — Progress Notes (Signed)
  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  It showed 3-dimensional radiation distributions overlaid onto the planning CT/MRI image set.  The [facility] for the target structures as well as the organs at risk were reviewed. The documentation of the 3D plan and dosimetry are filed in the radiation oncology EMR.  NARRATIVE:  Margaret Rubio was brought to the TrueBeam stereotactic radiation treatment machine and placed supine on the CT couch. The head frame was applied, and the patient was set up for stereotactic radiosurgery.  Neurosurgery was present for the set-up and delivery  SIMULATION VERIFICATION:  In the couch zero-angle position, the patient underwent Exactrac imaging using the Brainlab system with orthogonal KV images.  These were carefully aligned and repeated to confirm treatment position for each of the isocenters.  The Exactrac snap film verification was repeated at each couch angle.  PROCEDURE: Margaret Rubio received stereotactic radiosurgery to the following targets: Right orbital metastasis target was treated using 5 Rapid Arc VMAT Beams to a prescription dose of 5 Gy to be repeated for 5 fractions to 25 Gy total.  ExacTrac registration was performed for each couch angle.  The 100% isodose line was prescribed.  6 MV X-rays were delivered in the flattening filter free beam mode.  STEREOTACTIC TREATMENT MANAGEMENT:  Following delivery, the patient was transported to nursing in stable condition and monitored for possible acute effects.  Vital signs  were recorded. The patient tolerated treatment without significant acute effects, and was discharged to home in stable condition.    PLAN: Continue treatment for total of 5 fractions then follow-up in one month.  ________________________________  Sheral Apley. Tammi Klippel, M.D.

## 2016-10-18 ENCOUNTER — Ambulatory Visit
Admission: RE | Admit: 2016-10-18 | Discharge: 2016-10-18 | Disposition: A | Discharge status: Home / Self Care | Payer: Medicare PPO | Source: Ambulatory Visit | Attending: Radiation Oncology | Admitting: Radiation Oncology

## 2016-10-18 DIAGNOSIS — C7949 Secondary malignant neoplasm of other parts of nervous system: Secondary | ICD-10-CM

## 2016-10-18 DIAGNOSIS — Z510 Encounter for antineoplastic radiation therapy: Secondary | ICD-10-CM | POA: Diagnosis not present

## 2016-10-18 NOTE — Progress Notes (Signed)
  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  It showed 3-dimensional radiation distributions overlaid onto the planning CT/MRI image set.  The [facility] for the target structures as well as the organs at risk were reviewed. The documentation of the 3D plan and dosimetry are filed in the radiation oncology EMR.  NARRATIVE:  Margaret Rubio was brought to the TrueBeam stereotactic radiation treatment machine and placed supine on the CT couch. The head frame was applied, and the patient was set up for stereotactic radiosurgery.  Neurosurgery was present for the set-up and delivery  SIMULATION VERIFICATION:  In the couch zero-angle position, the patient underwent Exactrac imaging using the Brainlab system with orthogonal KV images.  These were carefully aligned and repeated to confirm treatment position for each of the isocenters.  The Exactrac snap film verification was repeated at each couch angle.  PROCEDURE: Margaret Rubio received stereotactic radiosurgery to the following targets: Right orbital metastasis target was treated using 5 Rapid Arc VMAT Beams to a prescription dose of 5 Gy to be repeated for 5 fractions to 25 Gy total.  ExacTrac registration was performed for each couch angle.  The 100% isodose line was prescribed.  6 MV X-rays were delivered in the flattening filter free beam mode.  STEREOTACTIC TREATMENT MANAGEMENT:  Following delivery, the patient was transported to nursing in stable condition and monitored for possible acute effects.  Vital signs  were recorded. The patient tolerated treatment without significant acute effects, and was discharged to home in stable condition.    PLAN: Continue treatment for total of 5 fractions then follow-up in one month.  ________________________________  Sheral Apley. Tammi Klippel, M.D.

## 2016-10-20 ENCOUNTER — Ambulatory Visit
Admission: RE | Admit: 2016-10-20 | Discharge: 2016-10-20 | Disposition: A | Discharge status: Home / Self Care | Payer: Medicare PPO | Source: Ambulatory Visit | Attending: Radiation Oncology | Admitting: Radiation Oncology

## 2016-10-20 DIAGNOSIS — Z510 Encounter for antineoplastic radiation therapy: Secondary | ICD-10-CM | POA: Diagnosis not present

## 2016-10-22 ENCOUNTER — Ambulatory Visit
Admission: RE | Admit: 2016-10-22 | Discharge: 2016-10-22 | Disposition: A | Discharge status: Home / Self Care | Payer: Medicare PPO | Source: Ambulatory Visit | Attending: Radiation Oncology | Admitting: Radiation Oncology

## 2016-10-22 VITALS — BP 138/87 | HR 72 | Temp 98.2°F | Resp 18 | Wt 199.0 lb

## 2016-10-22 DIAGNOSIS — C7949 Secondary malignant neoplasm of other parts of nervous system: Secondary | ICD-10-CM

## 2016-10-22 DIAGNOSIS — Z510 Encounter for antineoplastic radiation therapy: Secondary | ICD-10-CM | POA: Diagnosis not present

## 2016-10-22 NOTE — Progress Notes (Signed)
  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  It showed 3-dimensional radiation distributions overlaid onto the planning CT/MRI image set.  The [facility] for the target structures as well as the organs at risk were reviewed. The documentation of the 3D plan and dosimetry are filed in the radiation oncology EMR.  NARRATIVE:  Margaret Rubio was brought to the TrueBeam stereotactic radiation treatment machine and placed supine on the CT couch. The head frame was applied, and the patient was set up for stereotactic radiosurgery.  Neurosurgery was present for the first fraction set-up and delivery  SIMULATION VERIFICATION:  In the couch zero-angle position, the patient underwent Exactrac imaging using the Brainlab system with orthogonal KV images.  These were carefully aligned and repeated to confirm treatment position for each of the isocenters.  The Exactrac snap film verification was repeated at each couch angle.  PROCEDURE: Margaret Rubio received stereotactic radiosurgery to the following targets: Right orbital metastasis target was treated using 5 Rapid Arc VMAT Beams to a prescription dose of 5 Gy to be repeated for 5 fractions to 25 Gy total.  ExacTrac registration was performed for each couch angle.  The 100% isodose line was prescribed.  6 MV X-rays were delivered in the flattening filter free beam mode.  STEREOTACTIC TREATMENT MANAGEMENT:  Following delivery, the patient was transported to nursing in stable condition and monitored for possible acute  effects.  Vital signs were recorded. The patient tolerated treatment without significant acute effects, and was discharged to home in stable condition.    PLAN: Continue treatment for total of 5 fractions then follow-up in one month.  ________________________________  Sheral Apley. Tammi Klippel, M.D.

## 2016-10-22 NOTE — Progress Notes (Signed)
  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  Weight and vitals stable. Reports new onset left ear pain. Cecum build up in both ears noted. Denies tinnitus. Reports nausea and vomiting following initial SRS but, denies any since. Reports dry itchy left eye. Confirms she is not instilling any drops in her left eye. Explained that over the counter lubricating drops may be helpful. She verbalized understanding. Reports fatigue.    The patient is without complaint. Set-up films were reviewed. The chart was checked.  Physical Findings  weight is 199 lb (90.3 kg). Her oral temperature is 98.2 F (36.8 C). Her blood pressure is 138/87 and her pulse is 72. Her respiration is 18 and oxygen saturation is 100%. . Weight essentially stable.  No significant changes.  Impression The patient is tolerating radiation.  Plan The patient has completed SRS treatment today. She will return for follow up in 1 month.     Sheral Apley Tammi Klippel, M.D.  This document serves as a record of services personally performed by Tyler Pita, MD. It was created on his behalf by Arlyce Harman, a trained medical scribe. The creation of this record is based on the scribe's personal observations and the provider's statements to them. This document has been checked and approved by the attending provider.

## 2016-10-22 NOTE — Progress Notes (Signed)
Weight and vitals stable. Reports new onset left ear pain. Cecum build up in both ears noted. Denies tinnitus. Reports nausea and vomiting following initial SRS but, denies any since. Reports dry itchy left eye. Confirms she is not instilling any drops in her left eye. Explained that over the counter lubricating drops may be helpful. She verbalized understanding. Reports fatigue. One month follow up appointment card given.   BP 138/87 (BP Location: Right Arm, Patient Position: Sitting, Cuff Size: Normal)   Pulse 72   Temp 98.2 F (36.8 C) (Oral)   Resp 18   Wt 199 lb (90.3 kg)   SpO2 100%   BMI 33.12 kg/m  Wt Readings from Last 3 Encounters:  10/22/16 199 lb (90.3 kg)  09/29/16 201 lb (91.2 kg)  09/01/16 203 lb 11.2 oz (92.4 kg)

## 2016-10-25 NOTE — Progress Notes (Addendum)
[facility]  [phone] [phone]  Clinic follow up Note   Patient Care Team: [name], MD as PCP - General Truitt Merle, MD as Consulting Physician (Hematology)  10/26/2016  CHIEF COMPLAINTS Follow up breast cancer     Breast cancer of lower-outer quadrant of right female breast ([place])   09/03/2008 Initial Diagnosis    right breast cancer, T2N1Mo, stage IIA, ER+, PR+, HER2+ (ration 2.07). Pt declined surgery or other treatment.        05/20/2011 Tumor Marker    right breast mass biopsy showed IDA, ER+/PR+/HER2- (ratio 1.37)      09/04/2011 Cancer Staging    PET scan showed no distant mets      10/08/2014 Progression     lumbar spine MRI showed multiple bone metastasis , highly suspicious for  metastatic disease.      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 Increased size of right pleural effusion, indeterminate for malignant involvement of the pleural fluids.      01/20/2016 Imaging    CT CAP with contrast showed small bilateral pleural effusion, right greater than left, similar osseous metastasis, no evidence of extraosseous metastasis. Bone scan showed no areas of suspicious uptake.      09/27/2016 Imaging    CT CAP w contrast 1. Heterogeneous perfusion of the liver identified on today's study with some areas of hypoattenuating nodularity. This raises concern for development of liver metastases although this is not a definite finding today may simply reflect heterogeneous perfusion. MRI of the abdomen  without and with contrast would prove helpful to further evaluate, as clinically warranted. 2. Imaging features suggest bony metastatic disease, stable in the interval. Given lack of uptake on bone scan performed earlier today, these bony changes likely reflect treated disease. 3. Persistent marked skin thickening and edema diffusely in the right breast tracking into the region of the right axilla. Not substantially changed in the interval. 4. Interval resolution of small bilateral pleural effusions with posterior right lower lobe collapse/consolidation.      10/10/2016 Imaging    MR Abdomen w/wo contrast 1. Unusual perfusion abnormality involving the liver could be related to prior radiation effect but I do not see any MR findings suspicious for hepatic metastatic disease. The portal and hepatic veins are patent. 2. Stable surgical and radiation changes involving the right breast. No significant change in 21.5 mm irregular enhancing right lateral breast lesion. 3. Diffuse osseous metastatic disease.      10/13/2016 - 10/22/2016 Radiation Therapy    SRS treatment 5 fractions to the left orbit bone metastasis with Dr. Tammi Klippel.        HISTORY OF PRESENTING ILLNESS (10/30/2014):  Margaret Rubio 76 y.o. female  Was referred by her primary care physician to discuss the management probable metastatic breast cancer.  She was initially diagnosed with right breast cancer in November 2009. 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  In December of 2009, she underwent a right axillary lymph node biopsy on 09/24/2008 which showed metastatic ductal carcinoma. (Case No: [id number]) The tumor was ER 100% positive, PR 0% negative, Ki67 - 22%. 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 The ratio of HER-2:CEP 17 was 2.07.  She decided to pursue nature therapy by diet and exercise, and did not have  surgery and other tranditional therapy for her breast cancer.   In May 2011 she underwent a mammogram that showed a 3.8 cm invasive duct carcinoma in the subareolar region of the right breast which had increased since November 2009.  The tumor was ER  99% positive,  PR 4% positive, HER-2 negative ( ratio 1.37).  She was seen by Dr. Estella Husk and Dr. Lucia Gaskins.  However, she failed multiple appointments and sought alternative medical care in [place]. This involved putting something on her right breast that caused some scarring.  She noticed the enlarged right breast mass and nipple inversion about one year ago. No pain, but some skin change. She developed left low back/buttock and hip and  pain in Nov 2015, was seen by PCP Dr. Jenny Reichmann who prescribed vicodin and tramadol and pain improved with meds. Due to the persistent pain, she underwent a lumber MRI  On 10/08/2014, which showed multiple bone metastasis.  She run out pain medication about a week ago,  But did not call for refill. She states her pain is not bad , but she appears to be uncomfortable when she stands up and walks.  She otherwise feels well overall,  Has good appetite and energy level. She lives alone and still functions well.  She comes in with her niece today.   Current therapy:  1.Anastrozole 1 mg once daily started on 12/10/2014 2. Ibrance added on 07/29/15, stopped in Nov 2016, restarted on 01/10/2016, held during her radiation in 09/2016 3. Xgeva started on 08/15/2015, changed to every 3 months after 09/29/2016   INTERIM HISTORY: Ms. Olexa returns for follow-up. She completed radiation last week. Her vision has improved after radiation treatment. When she took tramadol she was sick on her stomach. She denies any more pain other than one headache. She did have one instance of pain in her right breast which went away on its own and the same in the left breast. Her hip is without pain. She has not taken Ibrance since last month - states  she has some at home. She will see Dr. Tammi Klippel again in one month. She reports itching skin under right arm pit. She can no longer feel a knot in her breast. She denies foot swelling. She reports 5 lbs intentional weight loss.   MEDICAL HISTORY:  Past Medical History:  Diagnosis Date  . 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 ASTHMA 01/31/2008  . Breast cancer [facility]) dx 2009   right breast  . BREAST CANCER, HX OF 11/14/2009  . BRONCHITIS, ACUTE 01/31/2008  . Cough 10/17/2009  . FATIGUE 11/14/2009  . GANGLION CYST 05/01/2009  . HYPERLIPIDEMIA 05/21/2007  . HYPERTENSION 05/05/2007  . LOW BACK PAIN 05/21/2007  . MENOPAUSAL DISORDER 05/22/2010  . Pain in joint, lower leg 05/10/2008  . RASH-NONVESICULAR 05/01/2009  . SHOULDER PAIN, LEFT 11/14/2009  . 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 VITAMIN B12 DEFICIENCY 05/23/2010    SURGICAL HISTORY: Past Surgical History:  Procedure Laterality Date  . OTHER SURGICAL HISTORY     fibroid removal  . removal of neck lump     benigh 1986    Social History  Social History  . Marital status: Widowed    Spouse name: N/A  . Number of children: 1  . Years of education: N/A   Social History Main Topics  . Smoking status: Never Smoker  . Smokeless tobacco: Never Used  . Alcohol use No  . Drug use: No  . Sexual activity: No   Other Topics Concern  . None   Social History Narrative  . None    FAMILY HISTORY: Family History  Problem Relation Age of Onset  . Hypertension Father   . Heart disease Father   . Asthma Other   . Diabetes Other   . Hypertension Other   . Cancer Paternal Aunt     leukemia ?  Paternal aunt had cancer, unknown type. No other family history of malignancy   ALLERGIES:  is allergic to tomato.  MEDICATIONS:  Current Outpatient Prescriptions on File Prior to Visit  Medication Sig Dispense Refill  . Ascorbic Acid (VITAMIN C) 100 MG CHEW Chew 1 tablet (100 mg total) by mouth every morning. 90 each 3  . calcium carbonate 1250 MG  capsule Take 1,250 mg by mouth 2 (two) times daily with a meal. Not sure of type or dose    . cholecalciferol (VITAMIN D) 1000 units tablet Take 1,000 Units by mouth daily. Patient not sure of dose    . clotrimazole (GYNE-LOTRIMIN 3) 2 % vaginal cream Apply to affected bilaterall axilla sites as needed for rash. 21 g 1  . Cyanocobalamin (B-12 COMPLIANCE INJECTION IJ) Inject as directed every 30 (thirty) days.    . palbociclib (IBRANCE) 125 MG capsule Take 1 capsule (125 mg total) by mouth daily with breakfast. Take whole with food. Take for 21 days. Repeat every 28 days (Patient not taking: Reported on 10/26/2016) 21 capsule 0   No current facility-administered medications on file prior to visit.   ; REVIEW OF SYSTEMS:   Constitutional: Denies fevers, chills or abnormal night sweats Eyes: Denies blurriness of vision, double vision or watery eyes Ears, nose, mouth, throat, and face: Denies mucositis or sore throat Respiratory: Denies cough, dyspnea or wheezes Cardiovascular: Denies palpitation, chest discomfort or lower extremity swelling Gastrointestinal:  Denies nausea, heartburn or change in bowel habits Skin: Denies abnormal skin rashes (+) itching skin under right arm pit Lymphatics: Denies new lymphadenopathy or easy bruising Neurological:Denies numbness, tingling or new weaknesses Behavioral/Psych: Mood is stable, no new changes  All other systems were reviewed with the patient and are negative.  PHYSICAL EXAMINATION: ECOG PERFORMANCE STATUS: 1 BP 137/60 (BP Location: Left Arm, Patient Position: Sitting)   Pulse 77   Temp 98 F (36.7 C) (Oral)   Resp 17   Ht '5\' 5"'$  (1.651 m)   Wt 195 lb 4.8 oz (88.6 kg)   SpO2 100%   BMI 32.50 kg/m   GENERAL:alert, no distress and comfortable SKIN: skin color, texture, turgor are normal, no significant lesions (+) Rash under right arm pit, red in color, consistent with fungal infection. EYES: normal, conjunctiva are pink and non-injected,  sclera clear OROPHARYNX:no exudate, no erythema and lips, buccal mucosa, and tongue normal  NECK: supple, thyroid normal size, non-tender, without nodularity LYMPH:  no palpable lymphadenopathy in the cervical, axillary or inguinal LUNGS: clear to auscultation and percussion with normal breathing effort, slightly decreased breath sounds at the bottom of right lung. HEART: regular rate & rhythm and no murmurs and no lower extremity edema ABDOMEN:abdomen soft, non-tender and normal bowel sounds Musculoskeletal:no cyanosis of digits and no  clubbing  PSYCH: alert & oriented x 3 with fluent speech NEURO: no focal motor/sensory deficits. Her left eye ball has limited range of movement  Breasts: Breast inspection showed  Significantly enlarged right breast, compared to left breast, (+) skin pigmentations , nipple inversion and a dry ulcer/ scar below the nipple, no discharge.  The right breast is softer than before, nontender , no discrete mass. Palpation of the  Left breasts and axilla revealed no obvious mass that I could appreciate.   LABORATORY DATA:  I have reviewed the data as listed CBC Latest Ref Rng & Units 10/26/2016 09/29/2016 09/01/2016  WBC 3.9 - 10.3 10e3/uL 3.9 4.0 3.2(L)  Hemoglobin 11.6 - 15.9 g/dL 11.4(L) 10.8(L) 10.7(L)  Hematocrit 34.8 - 46.6 % 35.0 32.5(L) 32.8(L)  Platelets 145 - 400 10e3/uL 190 277 216    CMP Latest Ref Rng & Units 10/26/2016 09/29/2016 09/01/2016  Glucose 70 - 140 mg/dl 86 81 86  BUN 7.0 - 26.0 mg/dL 9.9 8.5 9.4  Creatinine 0.6 - 1.1 mg/dL 0.8 0.7 0.8  Sodium 136 - 145 mEq/L 138 135(L) 138  Potassium 3.5 - 5.1 mEq/L 3.8 3.6 4.1  Chloride 96 - 112 mEq/L - - -  CO2 22 - 29 mEq/L '27 26 26  '$ Calcium 8.4 - 10.4 mg/dL 9.1 8.4 9.1  Total Protein 6.4 - 8.3 g/dL 6.6 6.7 6.9  Total Bilirubin 0.20 - 1.20 mg/dL 0.86 0.59 0.61  Alkaline Phos 40 - 150 U/L 91 83 70  AST 5 - 34 U/L 93(H) 75(H) 50(H)  ALT 0 - 55 U/L 64(H) 71(H) 39   CA27.29: 06/30/2012:  28 10/30/2014: 355 08/15/2015: 424 12/27/2014: 414 05/05/2016: 416.1 06/10/2016: 332.4 07/01/2016: 348.8 08/03/2016: 388.1 09/01/2016: 321.1   PATHOLOGY REPORT  RIGHT BREAST MASS, 6 O'CLOCK, NEEDLE CORE BIOPSIES: 09/03/2008 MICROSCOPIC FOCUS OF DUCTAL CARCINOMA IN SITU ASSOCIATED WITH MICROSCOPIC FOCUS OF MICROINVASION. SEE COMMET.  LYMPH NODE, RIGHT AXILLARY, NEEDLE CORE BIOPSIES: METASTATIC DUCTAL CARCINOMA.  09/24/2008  COMMENT There are needle core biopsies of lymph node showing partial replacement by metastatic adenocarcinoma consistent with metastasis from a breast primary, metastatic ductal carcinoma. Since the breast prognostic profile is not performed on the previous material because of limited invasive tumor ([id number]), the breast prognostic profile will be performed on the tumor in the lymph node and reported in a separate report. Interpretation HER-2/NEU BY CISH -- SHOWS AMPLIFICATION BY CISH ANALYSIS. THE RATIO OF HER-2: CEP 17 SIGNALS WAS 2.07  Diagnosis 05/20/2011 Breast, right, needle core biopsy - INVASIVE DUCTAL CARCINOMA, SEE COMMENT. - LYMPHOVASCULAR INVASION PRESENT. Estrogen Receptor (Negative, <1%): 99%, STRONG STAINING INTENSITY Progesterone Receptor (Negative, <1%): 4%, STRONG HER-2/NEU BY CISH - NO AMPLIFICATION OF HER-2 DETECTED. THE RATIO OF HER-2: CEP 17 SIGNALS WAS 1.38.  Breast, right, needle core biopsy, 6:30 o'clock 12/13/2014 - INVASIVE MAMMARY CARCINOMA. - SEE MICROSCOPIC DESCRIPTION. Microscopic Comment The features favor Grade II invasive ductal carcinoma. A breast prognostic profile will be performed. Dr. Donato Heinz agrees. The results were called to [facility] on 12/16/14. (JDP:ds 12/16/14) ADDENDUM: Immunohistochemistry for E-cadherin is performed and shows diffuse strong positivity consistent with invasive ductal carcinoma. Estrogen Receptor: 100%, POSITIVE, STRONG STAINING INTENSITY Progesterone Receptor:  2%, POSITIVE, STRONG STAINING INTENSITY Proliferation Marker Ki67: 27% HER-2/NEU BY CISH - NEGATIVE. RESULT RATIO OF HER2: CEP 17 SIGNALS 1.45 AVERAGE HER2 COPY NUMBER PER CELL 2.25  Diagnosis 08/01/2015 PLEURAL FLUID, RIGHT(SPECIMEN 1 OF 1 COLLECTED 08/01/15): METASTATIC CARCINOMA CONSISTENT WITH BREAST PRIMARY.   RADIOGRAPHIC STUDIES: I have personally  reviewed the radiological images as listed and agreed with the findings in the report.  PET 07/17/2015 IMPRESSION: 1. Suspected active diverticulitis along the sigmoid colon, with focal mesenteric stranding and a small locule of gas which is either within an inflamed diverticulum or a micro perforation. No overt free air. 2. Overall similar appearance of multifocal right breast cancer with extensive osseous metastatic disease. The right axillary lymph nodes are less notable that there is a borderline hypermetabolic left axillary lymph node currently. 3. Increasing size of the right pleural effusion, now moderate. Appearance indeterminate for malignant involvement of the pleural fluid. 4. Faintly increased activity in a right infrahilar lymph node which could represent early metastatic spread. 5. Other imaging findings of potential clinical significance: Chronic left maxillary sinusitis. Low-density blood pool suggests anemia. Left adrenal adenoma. These results will be called to the ordering clinician or representative by the Radiologist Assistant, and communication documented in the PACS or zVision Dashboard.  Brain MRI with and without contrast on 06/27/2015, at [facility] -In determinate, ill-defined soft tissue infiltration in the left orbit with questionable enhancement, further evaluation with CT orbits with contrast is recommended to the cyst in ruling out metastasis. -No intracranial metastasis -Diffusely hypoechoic intense bone marrow signal throughout the skull base   Bone scan  05/31/2016 IMPRESSION: New increased up take in the ribs bilaterally worrisome for metastatic disease. Stable increased uptake in the right fronto poor parietal bone.  A bilateral rib series is recommended.   CT chest, abdomen and pelvis with contrast 05/31/2016 IMPRESSION: 1. Similar fit appearance of widespread it treated osseous metastatic disease, no progression. No findings of new extra osseous metastatic disease. 2. Continued skin thickening and edema in the right breast. The edema in the right breast and tracking in the right chest wall is slightly increased compared to prior and the lateral soft tissue density in the right lateral breast is stable. 3. Similar small bilateral pleural effusions with some atelectasis at the right lung base possibly tethered to the parietal pleura. 4. Mild coronary artery atherosclerosis and atherosclerotic calcification of the aortic arch. 5. Airway thickening is present, suggesting bronchitis or reactive airways disease.  MR orbits w/wo contrast 07/22/2016  IMPRESSION: 1. Diffuse metastasis within the left orbit intraconal space, which likely invades the left extraocular muscles. No abnormal enhancement of the left optic nerve to strongly indicate nerve invasion although there does appear to be abnormal T2 signal within the substance of the L CN2, such as due to neuritis. The left globe appears spared. 2. No other soft tissue metastasis identified, an the right orbit appears normal. Evidence of osseous metastatic disease in the cervical spine and possibly also the calvarium.  MR Abdomen w/wo contrast 10/10/2016 IMPRESSION: 1. Unusual perfusion abnormality involving the liver could be related to prior radiation effect but I do not see any MR findings suspicious for hepatic metastatic disease. The portal and hepatic veins are patent. 2. Stable surgical and radiation changes involving the right breast. No significant change in 21.5  mm irregular enhancing right lateral breast lesion. 3. Diffuse osseous metastatic disease.  ASSESSMENT & PLAN:   Mrs. Dix is a 76 y.o. African-American female, who was diagnosed with node positive right breast  Invasive adenocarcinoma in  2009, untreated,  Now presents with low back pain and lumbar spine MRI is highly suspicious for bone metastasis.  1. Breast cancer of lower-outer quadrant of right breast,  invasive ductal carcinoma, Strongly ER positive,  PR weakly  positive,  HER-2 positive on first biopsy , but negative on second and third  biopsy 3 and 6 years later, with diffuse bone metastatic and right pleura, T4NxM1 stage IV  -I previously discussed her initial PET scan results with her and her daughter. 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 -she knows that her disease at this stage is incurable. The goal of treatment is palliative and to prolong her life. -she is on first line anastrozole, Ilda Foil was added on in October 2016 due to slight disease progression. She was not very compliant, off Ibrance for 3-4 months and restarted in 01/2016. -I previously discussed her restaging CT and bone scan from 05/31/2016, which showed diffuse similar appearing bone lesions, no other new metastasis. Bone scan showed mild bilateral rib uptake, but no corresponding findings on the CT. I think she has stable disease overall. -She is clinically doing well, tolerating Ibrance and anastrozole well, we'll continue. Patient is more compliant lately, I strongly encouraged her to continue treatment. -I previously reviewed her restaging CT scan and bone scan from 09/27/2016, which showed stable bone mets. CT scan showed heterogeneous perfusion of the liver indeterminate -I reviewed MR abdomen scan from 10/10/2016 with the patient, which showed no evidence of metastatic disease in liver  -Lab results reviewed, she has mild neutropenia, slight anemia, and mild transaminitis, we'll  continue monitoring -She completed radiation on 10/22/2016, she will restart Ibrance tomorrow, continuing anastrozole. -We'll plan to repeat her staging scan in 3 months  2. Left orbit bone metastasis  -this was found on her brain MRI in 06/2015 when she had heaache, she has issues with left eye ball movement, was evaluated by Dr. Clydie Braun at [facility] -this is likely a metastatic lesion from her breast cancer -her headache has gotten worse lately  -I previously discussed her orbital MRI scan findings, which showed diffuse metastases to status is within the left orbital with invading the left extraocular muscles. No optical nerve involvement. -She completed treatment with radiation oncologist Dr. Kathrynn Running, who performed 5 session of SRS to her left orbit bone metastasis   3. Low back/ hip pain,  Secondary to metastatic cancer to bones  -this has resolved since she started anastrozole -continue Xgeva every 3 months   4. Anemia -Likely secondary to her breast cancer, bone metastases and B12 deficiency -Slightly improved after B12 injections. Continue B12 monthly for now -No need for transfusion. We'll follow up closely.  5. Mild transaminitis -She has slightly increased liver enzymes, new in the past month -CT scan on 09/27/16 showed heterogeneous perfusion in the liver. -Liver MRI on 10/10/16 showed unusual perfusion abnormality involving the liver, but no definite evidence of metastasis.  6. Skin fungal infection in right axilla -Encouraged the patient to use cornstarch to help keep her skin dry. -I have prescribed nystatin powder twice daily until rash resolves   7. Goal of care discussion  -We again discussed the incurable nature of her cancer, and the overall poor prognosis, especially if he does not have good response to treatment or progress on treatment -The patient understands the goal of care is palliative. -I recommend DNR/DNI, she will think about it    Plan: -B12 injection  today, and Xgeva every 3 months. Her last Rivka Barbara was in December. -She has completed radiation and will restart [place] tomorrow. She will call our office if she needs a refill of Ibrance -Continue anastrozole. I have refilled anastrozole today. -I'll see her back in 4 weeks  with lab and B12 injection, next Xgeva due in 12/2016   All questions were answered. The patient knows to call the clinic with any problems, questions or concerns.  I spent 25 minutes counseling the patient face to face. The total time spent in the appointment was 30 minutes and more than 50% was on counseling.  This document serves as a record of services personally performed by Truitt Merle, MD. It was created on her behalf by Martinique Casey, a trained medical scribe. The creation of this record is based on the scribe's personal observations and the provider's statements to them. This document has been checked and approved by the attending provider.     Truitt Merle, MD 10/26/2016

## 2016-10-26 ENCOUNTER — Other Ambulatory Visit (HOSPITAL_BASED_OUTPATIENT_CLINIC_OR_DEPARTMENT_OTHER): Payer: Medicare PPO

## 2016-10-26 ENCOUNTER — Encounter: Payer: Self-pay | Admitting: Hematology

## 2016-10-26 ENCOUNTER — Ambulatory Visit (HOSPITAL_BASED_OUTPATIENT_CLINIC_OR_DEPARTMENT_OTHER): Payer: Medicare PPO | Admitting: Hematology

## 2016-10-26 ENCOUNTER — Telehealth: Payer: Self-pay | Admitting: Hematology

## 2016-10-26 ENCOUNTER — Ambulatory Visit (HOSPITAL_BASED_OUTPATIENT_CLINIC_OR_DEPARTMENT_OTHER): Payer: Medicare PPO

## 2016-10-26 VITALS — BP 137/60 | HR 77 | Temp 98.0°F | Resp 17 | Ht 65.0 in | Wt 195.3 lb

## 2016-10-26 DIAGNOSIS — C782 Secondary malignant neoplasm of pleura: Secondary | ICD-10-CM

## 2016-10-26 DIAGNOSIS — R740 Nonspecific elevation of levels of transaminase and lactic acid dehydrogenase [LDH]: Secondary | ICD-10-CM

## 2016-10-26 DIAGNOSIS — C7951 Secondary malignant neoplasm of bone: Secondary | ICD-10-CM

## 2016-10-26 DIAGNOSIS — G893 Neoplasm related pain (acute) (chronic): Secondary | ICD-10-CM

## 2016-10-26 DIAGNOSIS — I10 Essential (primary) hypertension: Secondary | ICD-10-CM

## 2016-10-26 DIAGNOSIS — E538 Deficiency of other specified B group vitamins: Secondary | ICD-10-CM | POA: Diagnosis not present

## 2016-10-26 DIAGNOSIS — Z7189 Other specified counseling: Secondary | ICD-10-CM

## 2016-10-26 DIAGNOSIS — C50511 Malignant neoplasm of lower-outer quadrant of right female breast: Secondary | ICD-10-CM

## 2016-10-26 DIAGNOSIS — J90 Pleural effusion, not elsewhere classified: Secondary | ICD-10-CM

## 2016-10-26 DIAGNOSIS — C50911 Malignant neoplasm of unspecified site of right female breast: Secondary | ICD-10-CM

## 2016-10-26 DIAGNOSIS — D649 Anemia, unspecified: Secondary | ICD-10-CM

## 2016-10-26 DIAGNOSIS — Z170 Estrogen receptor positive status [ER+]: Secondary | ICD-10-CM

## 2016-10-26 DIAGNOSIS — D630 Anemia in neoplastic disease: Secondary | ICD-10-CM

## 2016-10-26 LAB — CBC WITH DIFFERENTIAL/PLATELET
BASO%: 0.4 % (ref 0.0–2.0)
BASOS ABS: 0 10*3/uL (ref 0.0–0.1)
EOS ABS: 0 10*3/uL (ref 0.0–0.5)
EOS%: 0.7 % (ref 0.0–7.0)
HEMATOCRIT: 35 % (ref 34.8–46.6)
HEMOGLOBIN: 11.4 g/dL — AB (ref 11.6–15.9)
LYMPH%: 20.3 % (ref 14.0–49.7)
MCH: 29.9 pg (ref 25.1–34.0)
MCHC: 32.7 g/dL (ref 31.5–36.0)
MCV: 91.4 fL (ref 79.5–101.0)
MONO#: 0.3 10*3/uL (ref 0.1–0.9)
MONO%: 8 % (ref 0.0–14.0)
NEUT#: 2.7 10*3/uL (ref 1.5–6.5)
NEUT%: 70.6 % (ref 38.4–76.8)
PLATELETS: 190 10*3/uL (ref 145–400)
RBC: 3.82 10*6/uL (ref 3.70–5.45)
RDW: 13.1 % (ref 11.2–14.5)
WBC: 3.9 10*3/uL (ref 3.9–10.3)
lymph#: 0.8 10*3/uL — ABNORMAL LOW (ref 0.9–3.3)

## 2016-10-26 LAB — COMPREHENSIVE METABOLIC PANEL
ALBUMIN: 3.4 g/dL — AB (ref 3.5–5.0)
ALK PHOS: 91 U/L (ref 40–150)
ALT: 64 U/L — ABNORMAL HIGH (ref 0–55)
ANION GAP: 7 meq/L (ref 3–11)
AST: 93 U/L — ABNORMAL HIGH (ref 5–34)
BUN: 9.9 mg/dL (ref 7.0–26.0)
CALCIUM: 9.1 mg/dL (ref 8.4–10.4)
CHLORIDE: 104 meq/L (ref 98–109)
CO2: 27 mEq/L (ref 22–29)
Creatinine: 0.8 mg/dL (ref 0.6–1.1)
EGFR: 87 mL/min/{1.73_m2} — ABNORMAL LOW (ref >90)
Glucose: 86 mg/dl (ref 70–140)
POTASSIUM: 3.8 meq/L (ref 3.5–5.1)
Sodium: 138 mEq/L (ref 136–145)
Total Bilirubin: 0.86 mg/dL (ref 0.20–1.20)
Total Protein: 6.6 g/dL (ref 6.4–8.3)

## 2016-10-26 MED ORDER — CYANOCOBALAMIN 1000 MCG/ML IJ SOLN
1000.0000 ug | Freq: Once | INTRAMUSCULAR | Status: AC
  Administered 2016-10-26: 1000 ug via INTRAMUSCULAR

## 2016-10-26 MED ORDER — ANASTROZOLE 1 MG PO TABS: 1.0000 mg | ORAL_TABLET | Freq: Every day | ORAL | 5 refills | Status: DC

## 2016-10-26 MED ORDER — NYSTATIN PO POWD: ORAL | 1 refills | Status: AC

## 2016-10-26 MED ORDER — DENOSUMAB 120 MG/1.7ML ~~LOC~~ SOLN: 120.0000 mg | Freq: Once | SUBCUTANEOUS | Status: DC

## 2016-10-26 MED FILL — NYAMYC 100,000 UNITS/GM PWD: 100000 | 14 days supply | Qty: 30 | Fill #0

## 2016-10-26 NOTE — Patient Instructions (Signed)
Denosumab injection What is this medicine? DENOSUMAB (den oh sue mab) slows bone breakdown. Prolia is used to treat osteoporosis in women after menopause and in men. Delton See is used to prevent bone fractures and other bone problems caused by cancer bone metastases. Delton See is also used to treat giant cell tumor of the bone. COMMON BRAND NAME(S): Prolia, XGEVA What should I tell my health care provider before I take this medicine? They need to know if you have any of these conditions: -dental disease -eczema -infection or history of infections -kidney disease or on dialysis -low blood calcium or vitamin D -malabsorption syndrome -scheduled to have surgery or tooth extraction -taking medicine that contains denosumab -thyroid or parathyroid disease -an unusual reaction to denosumab, other medicines, foods, dyes, or preservatives -pregnant or trying to get pregnant -breast-feeding How should I use this medicine? This medicine is for injection under the skin. It is given by a health care professional in a hospital or clinic setting. If you are getting Prolia, a special MedGuide will be given to you by the pharmacist with each prescription and refill. Be sure to read this information carefully each time. For Prolia, talk to your pediatrician regarding the use of this medicine in children. Special care may be needed. For Delton See, talk to your pediatrician regarding the use of this medicine in children. While this drug may be prescribed for children as young as 13 years for selected conditions, precautions do apply. What if I miss a dose? It is important not to miss your dose. Call your doctor or health care professional if you are unable to keep an appointment. What may interact with this medicine? Do not take this medicine with any of the following medications: -other medicines containing denosumab This medicine may also interact with the following medications: -medicines that suppress the immune  system -medicines that treat cancer -steroid medicines like prednisone or cortisone What should I watch for while using this medicine? Visit your doctor or health care professional for regular checks on your progress. Your doctor or health care professional may order blood tests and other tests to see how you are doing. Call your doctor or health care professional if you get a cold or other infection while receiving this medicine. Do not treat yourself. This medicine may decrease your body's ability to fight infection. You should make sure you get enough calcium and vitamin D while you are taking this medicine, unless your doctor tells you not to. Discuss the foods you eat and the vitamins you take with your health care professional. See your dentist regularly. Brush and floss your teeth as directed. Before you have any dental work done, tell your dentist you are receiving this medicine. Do not become pregnant while taking this medicine or for 5 months after stopping it. Women should inform their doctor if they wish to become pregnant or think they might be pregnant. There is a potential for serious side effects to an unborn child. Talk to your health care professional or pharmacist for more information. What side effects may I notice from receiving this medicine? Side effects that you should report to your doctor or health care professional as soon as possible: -allergic reactions like skin rash, itching or hives, swelling of the face, lips, or tongue -breathing problems -chest pain -fast, irregular heartbeat -feeling faint or lightheaded, falls -fever, chills, or any other sign of infection -muscle spasms, tightening, or twitches -numbness or tingling -skin blisters or bumps, or is dry, peels, or red -slow  healing or unexplained pain in the mouth or jaw -unusual bleeding or bruising Side effects that usually do not require medical attention (report to your doctor or health care professional  if they continue or are bothersome): -muscle pain -stomach upset, gas Where should I keep my medicine? This medicine is only given in a clinic, doctor's office, or other health care setting and will not be stored at home.  2017 Elsevier/Gold Standard (2015-10-30 10:06:55)  Cyanocobalamin, Vitamin B12 injection What is this medicine? CYANOCOBALAMIN (sye an oh koe BAL a min) is a man made form of vitamin B12. Vitamin B12 is used in the growth of healthy blood cells, nerve cells, and proteins in the body. It also helps with the metabolism of fats and carbohydrates. This medicine is used to treat people who can not absorb vitamin B12. This medicine may be used for other purposes; ask your health care provider or pharmacist if you have questions. COMMON BRAND NAME(S): B-12 Compliance Kit, B-12 Injection Kit, Cyomin, LA-12, Nutri-Twelve, Physicians EZ Use B-12, Primabalt What should I tell my health care provider before I take this medicine? They need to know if you have any of these conditions: -kidney disease -Leber's disease -megaloblastic anemia -an unusual or allergic reaction to cyanocobalamin, cobalt, other medicines, foods, dyes, or preservatives -pregnant or trying to get pregnant -breast-feeding How should I use this medicine? This medicine is injected into a muscle or deeply under the skin. It is usually given by a health care professional in a clinic or doctor's office. However, your doctor may teach you how to inject yourself. Follow all instructions. Talk to your pediatrician regarding the use of this medicine in children. Special care may be needed. Overdosage: If you think you have taken too much of this medicine contact a poison control center or emergency room at once. NOTE: This medicine is only for you. Do not share this medicine with others. What if I miss a dose? If you are given your dose at a clinic or doctor's office, call to reschedule your appointment. If you give your  own injections and you miss a dose, take it as soon as you can. If it is almost time for your next dose, take only that dose. Do not take double or extra doses. What may interact with this medicine? -colchicine -heavy alcohol intake This list may not describe all possible interactions. Give your health care provider a list of all the medicines, herbs, non-prescription drugs, or dietary supplements you use. Also tell them if you smoke, drink alcohol, or use illegal drugs. Some items may interact with your medicine. What should I watch for while using this medicine? Visit your doctor or health care professional regularly. You may need blood work done while you are taking this medicine. You may need to follow a special diet. Talk to your doctor. Limit your alcohol intake and avoid smoking to get the best benefit. What side effects may I notice from receiving this medicine? Side effects that you should report to your doctor or health care professional as soon as possible: -allergic reactions like skin rash, itching or hives, swelling of the face, lips, or tongue -blue tint to skin -chest tightness, pain -difficulty breathing, wheezing -dizziness -red, swollen painful area on the leg Side effects that usually do not require medical attention (report to your doctor or health care professional if they continue or are bothersome): -diarrhea -headache This list may not describe all possible side effects. Call your doctor for medical advice about  side effects. You may report side effects to FDA at 1-800-FDA-1088. Where should I keep my medicine? Keep out of the reach of children. Store at room temperature between 15 and 30 degrees C (59 and 85 degrees F). Protect from light. Throw away any unused medicine after the expiration date. NOTE: This sheet is a summary. It may not cover all possible information. If you have questions about this medicine, talk to your doctor, pharmacist, or health care  provider.  2017 Elsevier/Gold Standard (2008-01-08 22:10:20)

## 2016-10-26 NOTE — Telephone Encounter (Signed)
Patient was given a copy of the AVS report and appointment schedule, per 10/26/16 los. Appointments scheduled per 10/26/16 los. °

## 2016-10-27 LAB — CANCER ANTIGEN 27.29: CA 27.29: 404.3 U/mL — ABNORMAL HIGH (ref 0.0–38.6)

## 2016-10-28 ENCOUNTER — Encounter: Payer: Self-pay | Admitting: Radiation Oncology

## 2016-10-28 NOTE — Progress Notes (Signed)
  Radiation Oncology         [phone] ________________________________  Name: Margaret Rubio MRN: [id number]  Date: 10/28/2016  DOB: 10-24-1940  End of Treatment Note  Diagnosis:  Left orbital metastasis    Indication for treatment:  Palliative       Radiation treatment dates:   10/13/16-10/22/16  Site/dose:   Left orbital mass was treated to 25 Gy in 5 fractions  Beams/energy:   IMRT / 6FFF  Narrative: The patient tolerated radiation treatment relatively well. During treatment, the patient reported left ear pain, and dry/itchy left eye. She denied using eye drops. She had cecum build up in bilateral ears.  Plan: The patient has completed radiation treatment. The patient will return to radiation oncology clinic for routine followup in one month. I advised her to call or return sooner if she has any questions or concerns related to her recovery or treatment. ________________________________  Sheral Apley. Tammi Klippel, M.D.   This document serves as a record of services personally performed by Tyler Pita, MD. It was created on his behalf by Bethann Humble, a trained medical scribe. The creation of this record is based on the scribe's personal observations and the provider's statements to them. This document has been checked and approved by the attending provider.

## 2016-11-03 ENCOUNTER — Telehealth: Payer: Self-pay | Admitting: Radiation Oncology

## 2016-11-03 NOTE — Telephone Encounter (Signed)
Daughter returned this RN's call. Daughter plans to send her brother by to check on her mother since she is at work. Daughter plans to call this RN back.

## 2016-11-03 NOTE — Telephone Encounter (Signed)
Received voicemail message from patient stating, "I have some blood coming out of the left side of my nose." Phoned patient immediately at the number she provided. Both attempts failed because "the number is no longer in service." Phoned patient's daughter, Margaret Rubio. No answer. Left message requesting a return call.

## 2016-11-03 NOTE — Telephone Encounter (Signed)
Per Shona Simpson, PA-C phoned patient and requested she present on Monday, January 29 at 1130 for a follow up to evaluate persistent nose bleed x 2-3 weeks. Explained if a nose bleed occurs before her follow up to pinch pressure at the bridge of the nose and not tilt her head back. Stressed if her nose persisted to bleed for greater than thirty minutes to present to the emergency room. Patient verbalized understanding of all reviewed.    Reported symptoms:  Nose bleed for 2-3 weeks only from left nostril. Bright red blood no clots. Occurs when she coughs, sneezes and occasionally after she first wakes up. Reports the cough she had has resolved. Denies headache, nausea, vomiting, or dizziness. Denies fever or not feeling well. Reports the bleeding typically stops after 2-3 minutes. Patient denies taking blood thinners.  Voicemail on home phone not set up. Able to reach patient on cell at [phone].

## 2016-11-04 NOTE — Progress Notes (Addendum)
Margaret Rubio. 76 y.o. woman with left orbital metastasis s/p SRS completed 10-22-16, c/o noes bleed one month FU.  Pain:None Dizziness:None Skin:Normal color under left eye. Nausea/Vomiting:None since December after stopping Tramadol. Fatigue:Having mild fatigue able to do house work. Nosebleed:Had a nosebleed last night small amount of bleeding, this past Saturday and Friday small amounts both days; has not had a cold or sinus problems or blood pressure was elevated. Wt Readings from Last 3 Encounters:  11/08/16 197 lb 3.2 oz (89.4 kg)  10/26/16 195 lb 4.8 oz (88.6 kg)  10/22/16 199 lb (90.3 kg)  BP 117/65   Pulse 74   Temp 98.3 F (36.8 C) (Oral)   Resp 18   Ht 5\' 5"  (1.651 m)   Wt 197 lb 3.2 oz (89.4 kg)   SpO2 100%   BMI 32.82 kg/m

## 2016-11-05 ENCOUNTER — Other Ambulatory Visit: Payer: Self-pay | Admitting: Hematology

## 2016-11-05 DIAGNOSIS — C50511 Malignant neoplasm of lower-outer quadrant of right female breast: Secondary | ICD-10-CM

## 2016-11-05 DIAGNOSIS — Z170 Estrogen receptor positive status [ER+]: Principal | ICD-10-CM

## 2016-11-05 MED FILL — ANASTROZOLE 1 MG TABLET: 1 | 30 days supply | Qty: 30 | Fill #0

## 2016-11-05 MED FILL — SM 3-DAY VAGINAL CREAM: 2 | 3 days supply | Qty: 21 | Fill #1

## 2016-11-05 NOTE — Telephone Encounter (Signed)
Pt states she has completed her RT. States Dr Burr Medico told her to restart her Ibrance. She states she needs a refill of Ibrance.

## 2016-11-08 ENCOUNTER — Encounter: Payer: Self-pay | Admitting: Radiation Oncology

## 2016-11-08 ENCOUNTER — Ambulatory Visit
Admission: RE | Admit: 2016-11-08 | Discharge: 2016-11-08 | Disposition: A | Discharge status: Home / Self Care | Payer: Medicare PPO | Source: Ambulatory Visit | Attending: Radiation Oncology | Admitting: Radiation Oncology

## 2016-11-08 VITALS — BP 117/65 | HR 74 | Temp 98.3°F | Resp 18 | Ht 65.0 in | Wt 197.2 lb

## 2016-11-08 DIAGNOSIS — C50919 Malignant neoplasm of unspecified site of unspecified female breast: Secondary | ICD-10-CM | POA: Insufficient documentation

## 2016-11-08 DIAGNOSIS — Z923 Personal history of irradiation: Secondary | ICD-10-CM | POA: Insufficient documentation

## 2016-11-08 DIAGNOSIS — R040 Epistaxis: Secondary | ICD-10-CM | POA: Diagnosis not present

## 2016-11-08 DIAGNOSIS — Z91018 Allergy to other foods: Secondary | ICD-10-CM | POA: Insufficient documentation

## 2016-11-08 DIAGNOSIS — C6962 Malignant neoplasm of left orbit: Secondary | ICD-10-CM | POA: Diagnosis not present

## 2016-11-08 DIAGNOSIS — C7951 Secondary malignant neoplasm of bone: Secondary | ICD-10-CM

## 2016-11-08 DIAGNOSIS — C50911 Malignant neoplasm of unspecified site of right female breast: Secondary | ICD-10-CM

## 2016-11-08 NOTE — Addendum Note (Signed)
Encounter addended by: Malena Edman, RN on: 11/08/2016  4:24 PM<BR>    Actions taken: Charge Capture section accepted

## 2016-11-08 NOTE — Progress Notes (Signed)
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 The patient presented with headaches and some deficits in peripheral vision which prompted imaging that identified a tumor in the left orbital region. She received SBRT which she completed 3 weeks ago. She has been having epistaxis as well.                      On review of systems, the patient states she's doing well without progressive changes in vision, rather she's noted some improvement. She denies any headaches at this time. She denies any concerns with hair loss, or skin irritation. No other complaints are verbalized.  ALLERGIES:  is allergic to tomato.  Meds: Current Outpatient Prescriptions  Medication Sig Dispense Refill  . anastrozole (ARIMIDEX) 1 MG tablet Take 1 tablet (1 mg total) by mouth daily. 30 tablet 5  . Ascorbic Acid (VITAMIN C) 100 MG CHEW Chew 1 tablet (100 mg total) by mouth every morning. 90 each 3  . calcium carbonate 1250 MG capsule Take 1,250 mg by mouth 2 (two) times daily with a meal. Not sure of type or dose    . cholecalciferol (VITAMIN D) 1000 units tablet Take 1,000 Units by mouth daily. Patient not sure of dose    . clotrimazole (GYNE-LOTRIMIN 3) 2 % vaginal cream Apply to affected bilaterall axilla sites as needed for rash. 21 g 1  . Cyanocobalamin (B-12 COMPLIANCE INJECTION IJ) Inject as directed every 30 (thirty) days.    . Nystatin POWD 1 apply to right axilla twice daily until rashes resolves 1 Bottle 1  . palbociclib (IBRANCE) 125 MG capsule Take 1 capsule (125 mg total) by mouth daily with  breakfast. Take whole with food. Take for 21 days. Repeat every 28 days 21 capsule 0   No current facility-administered medications for this encounter.     Physical Findings:  height is 5\' 5"  (1.651 m) and weight is 197 lb 3.2 oz (89.4 kg). Her oral temperature is 98.3 F (36.8 C). Her blood pressure is 117/65 and her pulse is 74. Her respiration is 18 and oxygen saturation is 100%.  In general this is a well appearing African American female in no acute distress. She's alert and oriented x4 and appropriate throughout the examination. Cardiopulmonary assessment is negative for acute distress and she exhibits normal effort. HEENT reveals normocephalic, atraumatic. EOMs are intact, though the left eye is slower to respond than the right. This is improved since her last visit. Nasal passages are assessed and on the left, there is a prominent vessel noted about 1 cm from the nare along Kiesselbach's plexus. No active bleeding is noted. No vessel abnormalities are noted otherwise.  Lab Findings: Lab Results  Component Value Date   WBC 3.9 10/26/2016   HGB 11.4 (L) 10/26/2016   HCT 35.0 10/26/2016   MCV 91.4 10/26/2016   PLT 190 10/26/2016     Radiographic Findings: Mr Abdomen W Wo Contrast  Result Date: 10/10/2016 CLINICAL DATA:  Breast cancer with abnormal appearance of the liver on recent CT scan  09/27/2016 EXAM: MRI ABDOMEN WITHOUT AND WITH CONTRAST TECHNIQUE: Multiplanar multisequence MR imaging of the abdomen was performed both before and after the administration of intravenous contrast. CONTRAST:  43mL MULTIHANCE GADOBENATE DIMEGLUMINE 529 MG/ML IV SOLN COMPARISON:  Recent CT scans from 2017. FINDINGS: Lower chest: Small bilateral pleural effusions, right greater than left with some overlying atelectasis. The heart is normal in size. No pericardial effusion. Stable extensive skin thickening and interstitial changes in the right breast likely related to surgery and radiation. No worrisome  enhancement irregular enhancing lesion in the right lateral breast appears stable and may represent enhancing scar tissue. Hepatobiliary: As demonstrated on the CT scan there is a perfusion abnormality involving the liver with very heterogeneous early arterial phase enhancement and more it normal diffuse enhancement on delayed images. No discrete hepatic lesions are identified to suggest metastasis. I do not see any definite areas of fatty infiltration on the inphase and out of phase gradient echo T1 weighted sequences. The portal and hepatic veins are patent. The gallbladder is normal. No intra or extrahepatic biliary dilatation. Pancreas:  No mass, inflammation or ductal dilatation. Spleen:  Normal size.  No focal lesions. Adrenals/Urinary Tract: The adrenal glands and kidneys appear stable. 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 Stomach/Bowel: The stomach, duodenum, visualized small bowel and visualize colon are unremarkable. 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 Vascular/Lymphatic: No pathologically enlarged lymph nodes identified. No abdominal aortic aneurysm demonstrated. Other:  No ascites or abdominal wall hernia. Musculoskeletal: Diffuse signal abnormality in the bony structures consistent with osseous metastatic disease. IMPRESSION: 1. Unusual perfusion abnormality involving the liver could be related to prior radiation effect but I do not see any MR findings suspicious for hepatic metastatic disease. The portal and hepatic veins are patent. 2. Stable surgical and radiation changes involving the right breast. No significant change in 21.5 mm irregular enhancing right lateral breast lesion. 3. Diffuse osseous metastatic disease. Electronically Signed   By: Marijo Sanes M.D.   On: 10/10/2016 16:35    Impression/Plan: 1. Metastatic breast cancer to left orbit. The patient appears to be doing well since completion of radiation. She continues on [place] with Dr. Burr Medico. We will follow her as needed moving forward. She states agreement and understanding. 2. Epistaxis. The patient's symptoms are notable intermittently each week. She will try aquaphor, saline nasal spray, and a humidifier. If her symptoms do not improve, she will let us know and we can coordinate an ENT referral for consideration of cauterization of Kiesselbach's plexus.     Carola Rhine, PAC

## 2016-11-10 ENCOUNTER — Telehealth: Payer: Self-pay | Admitting: *Deleted

## 2016-11-10 NOTE — Telephone Encounter (Signed)
Deanne Melson RN with [employer] called and left message regarding patient's nosebleeds.  Patient saw PA in Radiation but they did not do lab work.  Deanne concerned about no lab work done.  Note from 11/08/16 from PA  [employer] treated symptomatically however with instructions that if sx do not improve they will do an ENT referral for cauterization consideration.  Will forward this to Dr. Burr Medico to see if patient needs more intervention and lab work before visit on 11/30/16.

## 2016-11-10 NOTE — Telephone Encounter (Signed)
"  Margaret Rubio [facility] [employer] and nurse with [facility].  Patient see's Dr. Burr Medico.  Issues with nose bleeds.  Saw RT but no labs drawn.  How do you want to proceed?  Can call patient at [phone]."   See today's collaborative phone note.

## 2016-11-12 ENCOUNTER — Telehealth: Payer: Self-pay | Admitting: *Deleted

## 2016-11-12 NOTE — Telephone Encounter (Signed)
Please call pt to advise her to use nasal saline spray, and Vaseline to keep nose moisturized, do not blow nodes, use compression if she nosebleeding.   Truitt Merle MD

## 2016-11-12 NOTE — Telephone Encounter (Signed)
Pt returned call to nurse.  [facility] pt of Dr. Ernestina Penna instructions as below.   Pt voiced understanding.

## 2016-11-12 NOTE — Telephone Encounter (Signed)
Pt called reporting nose bleed intermittently for past 3 weeks.  Stated sometimes has nose bleed when blowing nose , and at times just blood dripping - lasting 1 minute.   Pt had restarted  Ibrance  Since  10/29/16  For  21 days.   Pt wanted to know what Dr. Burr Medico would suggest. Pt's   Phone    [phone].

## 2016-11-23 NOTE — Progress Notes (Signed)
[place]  [phone] [phone]  Clinic follow up Note   Patient Care Team: [name], MD as PCP - General Truitt Merle, MD as Consulting Physician (Hematology)  11/30/2016  CHIEF COMPLAINTS Follow up breast cancer     Breast cancer of lower-outer quadrant of right female breast ([place])   09/03/2008 Initial Diagnosis    right breast cancer, T2N1Mo, stage IIA, ER+, PR+, HER2+ (ration 2.07). Pt declined surgery or other treatment.        05/20/2011 Tumor Marker    right breast mass biopsy showed IDA, ER+/PR+/HER2- (ratio 1.37)      09/04/2011 Cancer Staging    PET scan showed no distant mets      10/08/2014 Progression     lumbar spine MRI showed multiple bone metastasis , highly suspicious for  metastatic disease.      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 Increased size of right pleural effusion, indeterminate for malignant involvement of the pleural fluids.      01/20/2016 Imaging    CT CAP with contrast showed small bilateral pleural effusion, right greater than left, similar osseous metastasis, no evidence of extraosseous metastasis. Bone scan showed no areas of suspicious uptake.      09/27/2016 Imaging    CT CAP w contrast 1. Heterogeneous perfusion of the liver identified on today's study with some areas of hypoattenuating nodularity. This raises concern for development of liver metastases although this is not a definite finding today may simply reflect heterogeneous perfusion. MRI of the abdomen  without and with contrast would prove helpful to further evaluate, as clinically warranted. 2. Imaging features suggest bony metastatic disease, stable in the interval. Given lack of uptake on bone scan performed earlier today, these bony changes likely reflect treated disease. 3. Persistent marked skin thickening and edema diffusely in the right breast tracking into the region of the right axilla. Not substantially changed in the interval. 4. Interval resolution of small bilateral pleural effusions with posterior right lower lobe collapse/consolidation.      10/10/2016 Imaging    MR Abdomen w/wo contrast 1. Unusual perfusion abnormality involving the liver could be related to prior radiation effect but I do not see any MR findings suspicious for hepatic metastatic disease. The portal and hepatic veins are patent. 2. Stable surgical and radiation changes involving the right breast. No significant change in 21.5 mm irregular enhancing right lateral breast lesion. 3. Diffuse osseous metastatic disease.      10/13/2016 - 10/22/2016 Radiation Therapy    SRS treatment 5 fractions to the left orbit bone metastasis with Dr. Tammi Klippel.        HISTORY OF PRESENTING ILLNESS (10/30/2014):  Margaret Rubio 76 y.o. female  Was referred by her primary care physician to discuss the management probable metastatic breast cancer.  She was initially diagnosed with right breast cancer in November 2009. 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  In December of 2009, she underwent a right axillary lymph node biopsy on 09/24/2008 which showed metastatic ductal carcinoma. (Case No: [id number]) The tumor was ER 100% positive, PR 0% negative, Ki67 - 22%. 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 The ratio of HER-2:CEP 17 was 2.07.  She decided to pursue nature therapy by diet and exercise, and did not have  surgery and other tranditional therapy for her breast cancer.   In May 2011 she underwent a mammogram that showed a 3.8 cm invasive duct carcinoma in the subareolar region of the right breast which had increased since November 2009.  The tumor was ER  99% positive,  PR 4% positive, HER-2 negative ( ratio 1.37).  She was seen by Dr. Estella Husk and Dr. Lucia Gaskins.  However, she failed multiple appointments and sought alternative medical care in [place]. This involved putting something on her right breast that caused some scarring.  She noticed the enlarged right breast mass and nipple inversion about one year ago. No pain, but some skin change. She developed left low back/buttock and hip and  pain in Nov 2015, was seen by PCP Dr. Jenny Reichmann who prescribed vicodin and tramadol and pain improved with meds. Due to the persistent pain, she underwent a lumber MRI  On 10/08/2014, which showed multiple bone metastasis.  She run out pain medication about a week ago,  But did not call for refill. She states her pain is not bad , but she appears to be uncomfortable when she stands up and walks.  She otherwise feels well overall,  Has good appetite and energy level. She lives alone and still functions well.  She comes in with her niece today.   Current therapy:  1.Anastrozole 1 mg once daily started on 12/10/2014 2. Ibrance added on 07/29/15, stopped in Nov 2016, restarted on 01/10/2016, held during her radiation in 09/2016 3. Xgeva started on 08/15/2015, changed to every 3 months after 09/29/2016   INTERIM HISTORY: Ms. Shedrick returns for follow-up. She completed radiation 10/22/16. The patient reports some weight gain and improved appetite. She denies double vision, and reports good peripheral vision. She denies headaches.  She reports she started [place] on 11/07/16, and took two extra tablets. She reports nosebleed in the mornings, this is not a lot of bleeding, but more of a "nuisance." She denied any other bleeding  issues.    MEDICAL HISTORY:  Past Medical History:  Diagnosis Date  . 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 ASTHMA 01/31/2008  . Breast cancer [facility]) dx 2009   right breast  . BREAST CANCER, HX OF 11/14/2009  . BRONCHITIS, ACUTE 01/31/2008  . Cough 10/17/2009  . FATIGUE 11/14/2009  . GANGLION CYST 05/01/2009  . HYPERLIPIDEMIA 05/21/2007  . HYPERTENSION 05/05/2007  . LOW BACK PAIN 05/21/2007  . MENOPAUSAL DISORDER 05/22/2010  . Pain in joint, lower leg 05/10/2008  . RASH-NONVESICULAR 05/01/2009  . SHOULDER PAIN, LEFT 11/14/2009  . 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 VITAMIN B12 DEFICIENCY 05/23/2010    SURGICAL HISTORY: Past Surgical History:  Procedure Laterality Date  . OTHER SURGICAL HISTORY     fibroid removal  . removal of neck lump     benigh 1986    Social History   Social History  . Marital status: Widowed    Spouse name: N/A  . Number of children: 1  . Years of education: N/A   Social History Main Topics  . Smoking status: Never Smoker  . Smokeless tobacco: Never Used  . Alcohol use No  .  Drug use: No  . Sexual activity: No   Other Topics Concern  . Not on file   Social History Narrative  . No narrative on file    FAMILY HISTORY: Family History  Problem Relation Age of Onset  . Hypertension Father   . Heart disease Father   . Asthma Other   . Diabetes Other   . Hypertension Other   . Cancer Paternal Aunt     leukemia ?  Paternal aunt had cancer, unknown type. No other family history of malignancy   ALLERGIES:  is allergic to tomato.  MEDICATIONS:  Current Outpatient Prescriptions on File Prior to Visit  Medication Sig Dispense Refill  . Ascorbic Acid (VITAMIN C) 100 MG CHEW Chew 1 tablet (100 mg total) by mouth every morning. 90 each 3  . calcium carbonate 1250 MG capsule Take 1,250 mg by mouth 2 (two) times daily with a meal. Not sure of type or dose    . cholecalciferol (VITAMIN D) 1000 units tablet Take 1,000 Units by mouth daily. Patient not sure of  dose    . clotrimazole (GYNE-LOTRIMIN 3) 2 % vaginal cream Apply to affected bilaterall axilla sites as needed for rash. 21 g 1  . Cyanocobalamin (B-12 COMPLIANCE INJECTION IJ) Inject as directed every 30 (thirty) days.    . Nystatin POWD 1 apply to right axilla twice daily until rashes resolves 1 Bottle 1   No current facility-administered medications on file prior to visit.   ; REVIEW OF SYSTEMS:   Constitutional: Denies fevers, chills or abnormal night sweats, (+) weight gain, (+) increased appetite Eyes: Denies blurriness of vision, double vision or watery eyes Ears, nose, mouth, throat, and face: Denies mucositis or sore throat, (+) nosebleeds Respiratory: Denies cough, dyspnea or wheezes Cardiovascular: Denies palpitation, chest discomfort or lower extremity swelling Gastrointestinal:  Denies nausea, heartburn or change in bowel habits Skin: Denies abnormal skin rashes (+) itching skin under right arm pit Lymphatics: Denies new lymphadenopathy or easy bruising Neurological:Denies numbness, tingling or new weaknesses Behavioral/Psych: Mood is stable, no new changes  All other systems were reviewed with the patient and are negative.  PHYSICAL EXAMINATION: ECOG PERFORMANCE STATUS: 1 BP (!) 147/64 (BP Location: Left Arm, Patient Position: Sitting) Comment: Made Nurse aware of elevated BP  Pulse 74   Temp 98.4 F (36.9 C) (Oral)   Resp 18   Ht '5\' 5"'$  (1.651 m)   Wt 202 lb 8 oz (91.9 kg)   SpO2 99%   BMI 33.70 kg/m   GENERAL:alert, no distress and comfortable SKIN: skin color, texture, turgor are normal, no significant lesions (+) Rash under right arm pit, red in color, consistent with fungal infection. EYES: normal, conjunctiva are pink and non-injected, sclera clear OROPHARYNX:no exudate, no erythema and lips, buccal mucosa, and tongue normal  NECK: supple, thyroid normal size, non-tender, without nodularity LYMPH:  no palpable lymphadenopathy in the cervical, axillary or  inguinal LUNGS: clear to auscultation and percussion with normal breathing effort, slightly decreased breath sounds at the bottom of right lung. HEART: regular rate & rhythm and no murmurs and no lower extremity edema ABDOMEN:abdomen soft, non-tender and normal bowel sounds Musculoskeletal:no cyanosis of digits and no clubbing  PSYCH: alert & oriented x 3 with fluent speech NEURO: no focal motor/sensory deficits. Her left eye ball has limited range of movement  Breasts: Breast inspection showed  Significantly enlarged right breast, compared to left breast, (+) skin pigmentations , nipple inversion and a dry ulcer/ scar below the  nipple, no discharge.  The right breast is softer than before, nontender , no discrete mass. Palpation of the  Left breasts and axilla revealed no obvious mass that I could appreciate.    LABORATORY DATA:  I have reviewed the data as listed CBC Latest Ref Rng & Units 11/30/2016 10/26/2016 09/29/2016  WBC 3.9 - 10.3 10e3/uL 1.6(L) 3.9 4.0  Hemoglobin 11.6 - 15.9 g/dL 9.7(L) 11.4(L) 10.8(L)  Hematocrit 34.8 - 46.6 % 29.1(L) 35.0 32.5(L)  Platelets 145 - 400 10e3/uL 143(L) 190 277    CMP Latest Ref Rng & Units 11/30/2016 10/26/2016 09/29/2016  Glucose 70 - 140 mg/dl 85 86 81  BUN 7.0 - 26.0 mg/dL 10.5 9.9 8.5  Creatinine 0.6 - 1.1 mg/dL 0.8 0.8 0.7  Sodium 136 - 145 mEq/L 137 138 135(L)  Potassium 3.5 - 5.1 mEq/L 4.0 3.8 3.6  Chloride 96 - 112 mEq/L - - -  CO2 22 - 29 mEq/L '23 27 26  '$ Calcium 8.4 - 10.4 mg/dL 8.8 9.1 8.4  Total Protein 6.4 - 8.3 g/dL 6.4 6.6 6.7  Total Bilirubin 0.20 - 1.20 mg/dL 1.00 0.86 0.59  Alkaline Phos 40 - 150 U/L 91 91 83  AST 5 - 34 U/L 52(H) 93(H) 75(H)  ALT 0 - 55 U/L 33 64(H) 71(H)   CA27.29: 06/30/2012: 28 10/30/2014: 355 08/15/2015: 424 12/27/2014: 414 05/05/2016: 416.1 06/10/2016: 332.4 07/01/2016: 348.8 08/03/2016: 388.1 09/01/2016: 321.1 10/26/16: 404.3  PATHOLOGY REPORT  RIGHT BREAST MASS, 6 O'CLOCK, NEEDLE CORE BIOPSIES:  09/03/2008 MICROSCOPIC FOCUS OF DUCTAL CARCINOMA IN SITU ASSOCIATED WITH MICROSCOPIC FOCUS OF MICROINVASION. SEE COMMET.  LYMPH NODE, RIGHT AXILLARY, NEEDLE CORE BIOPSIES: METASTATIC DUCTAL CARCINOMA.  09/24/2008  COMMENT There are needle core biopsies of lymph node showing partial replacement by metastatic adenocarcinoma consistent with metastasis from a breast primary, metastatic ductal carcinoma. Since the breast prognostic profile is not performed on the previous material because of limited invasive tumor ([id number]), the breast prognostic profile will be performed on the tumor in the lymph node and reported in a separate report. Interpretation HER-2/NEU BY CISH -- SHOWS AMPLIFICATION BY CISH ANALYSIS. THE RATIO OF HER-2: CEP 17 SIGNALS WAS 2.07  Diagnosis 05/20/2011 Breast, right, needle core biopsy - INVASIVE DUCTAL CARCINOMA, SEE COMMENT. - LYMPHOVASCULAR INVASION PRESENT. Estrogen Receptor (Negative, <1%): 99%, STRONG STAINING INTENSITY Progesterone Receptor (Negative, <1%): 4%, STRONG HER-2/NEU BY CISH - NO AMPLIFICATION OF HER-2 DETECTED. THE RATIO OF HER-2: CEP 17 SIGNALS WAS 1.38.  Breast, right, needle core biopsy, 6:30 o'clock 12/13/2014 - INVASIVE MAMMARY CARCINOMA. - SEE MICROSCOPIC DESCRIPTION. Microscopic Comment The features favor Grade II invasive ductal carcinoma. A breast prognostic profile will be performed. Dr. Donato Heinz agrees. The results were called to [facility] on 12/16/14. (JDP:ds 12/16/14) ADDENDUM: Immunohistochemistry for E-cadherin is performed and shows diffuse strong positivity consistent with invasive ductal carcinoma. Estrogen Receptor: 100%, POSITIVE, STRONG STAINING INTENSITY Progesterone Receptor: 2%, POSITIVE, STRONG STAINING INTENSITY Proliferation Marker Ki67: 27% HER-2/NEU BY CISH - NEGATIVE. RESULT RATIO OF HER2: CEP 17 SIGNALS 1.45 AVERAGE HER2 COPY NUMBER PER CELL 2.25  Diagnosis 08/01/2015 PLEURAL  FLUID, RIGHT(SPECIMEN 1 OF 1 COLLECTED 08/01/15): METASTATIC CARCINOMA CONSISTENT WITH BREAST PRIMARY.   RADIOGRAPHIC STUDIES: I have personally reviewed the radiological images as listed and agreed with the findings in the report.  PET 07/17/2015 IMPRESSION: 1. Suspected active diverticulitis along the sigmoid colon, with focal mesenteric stranding and a small locule of gas which is either within an inflamed diverticulum or a micro perforation. No overt free  air. 2. Overall similar appearance of multifocal right breast cancer with extensive osseous metastatic disease. The right axillary lymph nodes are less notable that there is a borderline hypermetabolic left axillary lymph node currently. 3. Increasing size of the right pleural effusion, now moderate. Appearance indeterminate for malignant involvement of the pleural fluid. 4. Faintly increased activity in a right infrahilar lymph node which could represent early metastatic spread. 5. Other imaging findings of potential clinical significance: Chronic left maxillary sinusitis. Low-density blood pool suggests anemia. Left adrenal adenoma. These results will be called to the ordering clinician or representative by the Radiologist Assistant, and communication documented in the PACS or zVision Dashboard.  Brain MRI with and without contrast on 06/27/2015, [facility] -In determinate, ill-defined soft tissue infiltration in the left orbit with questionable enhancement, further evaluation with CT orbits with contrast is recommended to the cyst in ruling out metastasis. -No intracranial metastasis -Diffusely hypoechoic intense bone marrow signal throughout the skull base   Bone scan 05/31/2016 IMPRESSION: New increased up take in the ribs bilaterally worrisome for metastatic disease. Stable increased uptake in the right fronto poor parietal bone.  A bilateral rib series is  recommended.   CT chest, abdomen and pelvis with contrast 05/31/2016 IMPRESSION: 1. Similar fit appearance of widespread it treated osseous metastatic disease, no progression. No findings of new extra osseous metastatic disease. 2. Continued skin thickening and edema in the right breast. The edema in the right breast and tracking in the right chest wall is slightly increased compared to prior and the lateral soft tissue density in the right lateral breast is stable. 3. Similar small bilateral pleural effusions with some atelectasis at the right lung base possibly tethered to the parietal pleura. 4. Mild coronary artery atherosclerosis and atherosclerotic calcification of the aortic arch. 5. Airway thickening is present, suggesting bronchitis or reactive airways disease.  MR orbits w/wo contrast 07/22/2016  IMPRESSION: 1. Diffuse metastasis within the left orbit intraconal space, which likely invades the left extraocular muscles. No abnormal enhancement of the left optic nerve to strongly indicate nerve invasion although there does appear to be abnormal T2 signal within the substance of the L CN2, such as due to neuritis. The left globe appears spared. 2. No other soft tissue metastasis identified, an the right orbit appears normal. Evidence of osseous metastatic disease in the cervical spine and possibly also the calvarium.  MR Abdomen w/wo contrast 10/10/2016 IMPRESSION: 1. Unusual perfusion abnormality involving the liver could be related to prior radiation effect but I do not see any MR findings suspicious for hepatic metastatic disease. The portal and hepatic veins are patent. 2. Stable surgical and radiation changes involving the right breast. No significant change in 21.5 mm irregular enhancing right lateral breast lesion. 3. Diffuse osseous metastatic disease.  ASSESSMENT & PLAN:   Mrs. Valdivia is a 76 y.o. African-American female, who was diagnosed with node  positive right breast  Invasive adenocarcinoma in  2009, untreated,  Now presents with low back pain and lumbar spine MRI is highly suspicious for bone metastasis.  1. 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 Both MRI and PET scan  are consistent with diffuse bone metastasis. -she knows that her disease at this stage is incurable. The goal of treatment is palliative and to prolong her life. -she is on first line anastrozole, Leslee Home was added on in October 2016 due to slight disease progression. She was not very compliant, off Ibrance for 3-4 months and restarted in 01/2016. -I previously discussed her restaging CT and bone scan from 05/31/2016, which showed diffuse similar appearing bone lesions, no other new metastasis. Bone scan showed mild bilateral rib uptake, but no corresponding findings on the CT. I think she has stable disease overall. -She is clinically doing well, tolerating Ibrance and anastrozole well, we'll continue. Patient is more compliant lately, I strongly encouraged her to continue treatment. -I previously reviewed her restaging CT scan and bone scan from 09/27/2016, which showed stable bone mets. CT scan showed heterogeneous perfusion of the liver indeterminate -I previously reviewed MR abdomen scan from 10/10/2016 with the patient, which showed no evidence of metastatic disease in liver  -Lab results reviewed, she has neutropenia, secondary to [place] (she took 2-3 extra days), we'll continue monitoring -I'll repeat her CBC next Thursday, if neutropenia resolves with ANC more than 1.0, she will start next cycle I press on March 1 -We'll plan to repeat her staging  scan in 5 weeks  2. Left orbit bone metastasis  -this was found on her brain MRI in 06/2015 when she had heaache, she has issues with left eye ball movement, was evaluated [facility] Emilio Math [facility] -this is likely a metastatic lesion from her breast cancer -I previously discussed her orbital MRI scan findings, which showed diffuse metastases to status is within the left orbital with invading the left extraocular muscles. No optical nerve involvement. -She completed treatment with radiation oncologist Dr. Tammi Klippel, who performed 5 session of SRS to her left orbit bone metastasis   3. Low back/ hip pain,  Secondary to metastatic cancer to bones  -this has resolved since she started anastrozole -continue Xgeva every 3 months, next due in March   4. Anemia -Likely secondary to her breast cancer, bone metastases and B12 deficiency -Slightly improved after B12 injections. Continue B12 monthly for now -No need for transfusion. We'll follow up closely. -The patient is neutropenic today because she took 2 extra doses of Ibrance.  5. Mild transaminitis -She has slightly increased liver enzymes, overall stable  -CT scan on 09/27/16 showed heterogeneous perfusion in the liver. -Liver MRI on 10/10/16 showed unusual perfusion abnormality involving the liver, but no definite evidence of metastasis.  6. Goal of care discussion  -We previously discussed the incurable nature of her cancer, and the overall poor prognosis, especially if he does not have good response to treatment or progress on treatment -The patient understands the goal of care is palliative. -I recommend DNR/DNI, she will think about it    Plan: -B12 injection today, and Xgeva on next visit. -Continue anastrozole. -She will return next week for labs, if ANC>1.0, she will start next cycle Ibrance on 3/1  -I'll see her back in 5 weeks with a CT and bone scan beforehand. -The patient's next B12 injection and next Xgeva due in 12/2016   All  questions were answered. The patient knows to call the clinic with any problems, questions or concerns.  I spent 25 minutes counseling the patient face to face. The total time spent in the appointment was 30 minutes and more than 50% was on counseling.  This document serves as a record  of services personally performed by Truitt Merle, MD. It was created on her behalf by Maryla Morrow, a trained medical scribe. The creation of this record is based on the scribe's personal observations and the provider's statements to them. This document has been checked and approved by the attending provider.     Truitt Merle, MD 11/30/2016

## 2016-11-25 DIAGNOSIS — Z6832 Body mass index (BMI) 32.0-32.9, adult: Secondary | ICD-10-CM | POA: Diagnosis not present

## 2016-11-25 DIAGNOSIS — C50911 Malignant neoplasm of unspecified site of right female breast: Secondary | ICD-10-CM | POA: Diagnosis not present

## 2016-11-25 DIAGNOSIS — Z79811 Long term (current) use of aromatase inhibitors: Secondary | ICD-10-CM | POA: Diagnosis not present

## 2016-11-25 DIAGNOSIS — R600 Localized edema: Secondary | ICD-10-CM | POA: Diagnosis not present

## 2016-11-30 ENCOUNTER — Encounter (HOSPITAL_BASED_OUTPATIENT_CLINIC_OR_DEPARTMENT_OTHER): Payer: Medicare HMO

## 2016-11-30 ENCOUNTER — Encounter: Payer: Self-pay | Admitting: Hematology

## 2016-11-30 ENCOUNTER — Ambulatory Visit: Payer: Medicare HMO

## 2016-11-30 ENCOUNTER — Other Ambulatory Visit (HOSPITAL_BASED_OUTPATIENT_CLINIC_OR_DEPARTMENT_OTHER): Payer: Medicare HMO

## 2016-11-30 ENCOUNTER — Ambulatory Visit (HOSPITAL_BASED_OUTPATIENT_CLINIC_OR_DEPARTMENT_OTHER): Payer: Medicare HMO | Admitting: Hematology

## 2016-11-30 ENCOUNTER — Telehealth: Payer: Self-pay | Admitting: Hematology

## 2016-11-30 VITALS — BP 147/64 | HR 74 | Temp 98.4°F | Resp 18 | Ht 65.0 in | Wt 202.5 lb

## 2016-11-30 DIAGNOSIS — C7951 Secondary malignant neoplasm of bone: Secondary | ICD-10-CM

## 2016-11-30 DIAGNOSIS — R740 Nonspecific elevation of levels of transaminase and lactic acid dehydrogenase [LDH]: Secondary | ICD-10-CM

## 2016-11-30 DIAGNOSIS — C50511 Malignant neoplasm of lower-outer quadrant of right female breast: Secondary | ICD-10-CM | POA: Diagnosis not present

## 2016-11-30 DIAGNOSIS — G893 Neoplasm related pain (acute) (chronic): Secondary | ICD-10-CM

## 2016-11-30 DIAGNOSIS — I10 Essential (primary) hypertension: Secondary | ICD-10-CM

## 2016-11-30 DIAGNOSIS — D649 Anemia, unspecified: Secondary | ICD-10-CM

## 2016-11-30 DIAGNOSIS — Z170 Estrogen receptor positive status [ER+]: Principal | ICD-10-CM

## 2016-11-30 DIAGNOSIS — E538 Deficiency of other specified B group vitamins: Secondary | ICD-10-CM

## 2016-11-30 DIAGNOSIS — E539 Vitamin B deficiency, unspecified: Secondary | ICD-10-CM

## 2016-11-30 DIAGNOSIS — D630 Anemia in neoplastic disease: Secondary | ICD-10-CM

## 2016-11-30 LAB — COMPREHENSIVE METABOLIC PANEL
ALBUMIN: 3.3 g/dL — AB (ref 3.5–5.0)
ALK PHOS: 91 U/L (ref 40–150)
ALT: 33 U/L (ref 0–55)
AST: 52 U/L — AB (ref 5–34)
Anion Gap: 9 mEq/L (ref 3–11)
BUN: 10.5 mg/dL (ref 7.0–26.0)
CALCIUM: 8.8 mg/dL (ref 8.4–10.4)
CO2: 23 mEq/L (ref 22–29)
CREATININE: 0.8 mg/dL (ref 0.6–1.1)
Chloride: 105 mEq/L (ref 98–109)
EGFR: 87 mL/min/{1.73_m2} — ABNORMAL LOW (ref >90)
Glucose: 85 mg/dl (ref 70–140)
Potassium: 4 mEq/L (ref 3.5–5.1)
Sodium: 137 mEq/L (ref 136–145)
Total Bilirubin: 1 mg/dL (ref 0.20–1.20)
Total Protein: 6.4 g/dL (ref 6.4–8.3)

## 2016-11-30 LAB — CBC WITH DIFFERENTIAL/PLATELET
BASO%: 0.4 % (ref 0.0–2.0)
BASOS ABS: 0 10*3/uL (ref 0.0–0.1)
EOS%: 0.3 % (ref 0.0–7.0)
Eosinophils Absolute: 0 10*3/uL (ref 0.0–0.5)
HEMATOCRIT: 29.1 % — AB (ref 34.8–46.6)
HEMOGLOBIN: 9.7 g/dL — AB (ref 11.6–15.9)
LYMPH#: 0.7 10*3/uL — AB (ref 0.9–3.3)
LYMPH%: 47.4 % (ref 14.0–49.7)
MCH: 31.6 pg (ref 25.1–34.0)
MCHC: 33.3 g/dL (ref 31.5–36.0)
MCV: 95 fL (ref 79.5–101.0)
MONO#: 0.1 10*3/uL (ref 0.1–0.9)
MONO%: 6.8 % (ref 0.0–14.0)
NEUT#: 0.7 10*3/uL — ABNORMAL LOW (ref 1.5–6.5)
NEUT%: 45.1 % (ref 38.4–76.8)
Platelets: 143 10*3/uL — ABNORMAL LOW (ref 145–400)
RBC: 3.06 10*6/uL — ABNORMAL LOW (ref 3.70–5.45)
RDW: 15.6 % — AB (ref 11.2–14.5)
WBC: 1.6 10*3/uL — ABNORMAL LOW (ref 3.9–10.3)

## 2016-11-30 MED ORDER — CYANOCOBALAMIN 1000 MCG/ML IJ SOLN
1000.0000 ug | Freq: Once | INTRAMUSCULAR | Status: AC
  Administered 2016-11-30: 1000 ug via INTRAMUSCULAR

## 2016-11-30 MED ORDER — ANASTROZOLE 1 MG PO TABS: 1.0000 mg | ORAL_TABLET | Freq: Every day | ORAL | 5 refills | Status: DC

## 2016-11-30 MED ORDER — PALBOCICLIB 125 MG PO CAPS: ORAL_CAPSULE | ORAL | 2 refills | Status: DC

## 2016-11-30 MED FILL — ANASTROZOLE 1 MG TABLET: 1 | 30 days supply | Qty: 30 | Fill #0

## 2016-11-30 MED FILL — IBRANCE 125 MG CAPSULE: 125 | 28 days supply | Qty: 21 | Fill #0

## 2016-11-30 NOTE — Telephone Encounter (Signed)
Appointments scheduled per 11/30/16 los. Patient was given a copy of the AVS report and appointment schedule per 11/30/16 los.

## 2016-11-30 NOTE — Patient Instructions (Signed)
Cyanocobalamin, Vitamin B12 injection What is this medicine? CYANOCOBALAMIN (sye an oh koe BAL a min) is a man made form of vitamin B12. Vitamin B12 is used in the growth of healthy blood cells, nerve cells, and proteins in the body. It also helps with the metabolism of fats and carbohydrates. This medicine is used to treat people who can not absorb vitamin B12. This medicine may be used for other purposes; ask your health care provider or pharmacist if you have questions. COMMON BRAND NAME(S): B-12 Compliance Kit, B-12 Injection Kit, Cyomin, LA-12, Nutri-Twelve, Physicians EZ Use B-12, Primabalt What should I tell my health care provider before I take this medicine? They need to know if you have any of these conditions: -kidney disease -Leber's disease -megaloblastic anemia -an unusual or allergic reaction to cyanocobalamin, cobalt, other medicines, foods, dyes, or preservatives -pregnant or trying to get pregnant -breast-feeding How should I use this medicine? This medicine is injected into a muscle or deeply under the skin. It is usually given by a health care professional in a clinic or doctor's office. However, your doctor may teach you how to inject yourself. Follow all instructions. Talk to your pediatrician regarding the use of this medicine in children. Special care may be needed. Overdosage: If you think you have taken too much of this medicine contact a poison control center or emergency room at once. NOTE: This medicine is only for you. Do not share this medicine with others. What if I miss a dose? If you are given your dose at a clinic or doctor's office, call to reschedule your appointment. If you give your own injections and you miss a dose, take it as soon as you can. If it is almost time for your next dose, take only that dose. Do not take double or extra doses. What may interact with this medicine? -colchicine -heavy alcohol intake This list may not describe all possible  interactions. Give your health care provider a list of all the medicines, herbs, non-prescription drugs, or dietary supplements you use. Also tell them if you smoke, drink alcohol, or use illegal drugs. Some items may interact with your medicine. What should I watch for while using this medicine? Visit your doctor or health care professional regularly. You may need blood work done while you are taking this medicine. You may need to follow a special diet. Talk to your doctor. Limit your alcohol intake and avoid smoking to get the best benefit. What side effects may I notice from receiving this medicine? Side effects that you should report to your doctor or health care professional as soon as possible: -allergic reactions like skin rash, itching or hives, swelling of the face, lips, or tongue -blue tint to skin -chest tightness, pain -difficulty breathing, wheezing -dizziness -red, swollen painful area on the leg Side effects that usually do not require medical attention (report to your doctor or health care professional if they continue or are bothersome): -diarrhea -headache This list may not describe all possible side effects. Call your doctor for medical advice about side effects. You may report side effects to FDA at 1-800-FDA-1088. Where should I keep my medicine? Keep out of the reach of children. Store at room temperature between 15 and 30 degrees C (59 and 85 degrees F). Protect from light. Throw away any unused medicine after the expiration date. NOTE: This sheet is a summary. It may not cover all possible information. If you have questions about this medicine, talk to your doctor, pharmacist, or   health care provider.  2017 [employer]vier/Gold Standard (2008-01-08 22:10:20)  

## 2016-12-01 LAB — CANCER ANTIGEN 27.29: CA 27.29: 403.7 U/mL — ABNORMAL HIGH (ref 0.0–38.6)

## 2016-12-06 ENCOUNTER — Other Ambulatory Visit: Payer: Medicare HMO

## 2016-12-08 ENCOUNTER — Other Ambulatory Visit (HOSPITAL_BASED_OUTPATIENT_CLINIC_OR_DEPARTMENT_OTHER): Payer: Medicare HMO

## 2016-12-08 DIAGNOSIS — Z170 Estrogen receptor positive status [ER+]: Principal | ICD-10-CM

## 2016-12-08 DIAGNOSIS — C50511 Malignant neoplasm of lower-outer quadrant of right female breast: Secondary | ICD-10-CM

## 2016-12-08 LAB — CBC WITH DIFFERENTIAL/PLATELET
BASO%: 0.5 % (ref 0.0–2.0)
BASOS ABS: 0 10*3/uL (ref 0.0–0.1)
EOS ABS: 0 10*3/uL (ref 0.0–0.5)
EOS%: 0.1 % (ref 0.0–7.0)
HEMATOCRIT: 30.4 % — AB (ref 34.8–46.6)
HEMOGLOBIN: 10.1 g/dL — AB (ref 11.6–15.9)
LYMPH#: 1 10*3/uL (ref 0.9–3.3)
LYMPH%: 40.2 % (ref 14.0–49.7)
MCH: 32.1 pg (ref 25.1–34.0)
MCHC: 33.4 g/dL (ref 31.5–36.0)
MCV: 96.2 fL (ref 79.5–101.0)
MONO#: 0.4 10*3/uL (ref 0.1–0.9)
MONO%: 16.2 % — ABNORMAL HIGH (ref 0.0–14.0)
NEUT#: 1.1 10*3/uL — ABNORMAL LOW (ref 1.5–6.5)
NEUT%: 43 % (ref 38.4–76.8)
PLATELETS: 200 10*3/uL (ref 145–400)
RBC: 3.16 10*6/uL — ABNORMAL LOW (ref 3.70–5.45)
RDW: 17.5 % — AB (ref 11.2–14.5)
WBC: 2.6 10*3/uL — ABNORMAL LOW (ref 3.9–10.3)

## 2016-12-08 LAB — COMPREHENSIVE METABOLIC PANEL
ALBUMIN: 3.2 g/dL — AB (ref 3.5–5.0)
ALK PHOS: 106 U/L (ref 40–150)
ALT: 45 U/L (ref 0–55)
ANION GAP: 7 meq/L (ref 3–11)
AST: 73 U/L — AB (ref 5–34)
BUN: 8.2 mg/dL (ref 7.0–26.0)
CALCIUM: 8.8 mg/dL (ref 8.4–10.4)
CO2: 25 mEq/L (ref 22–29)
CREATININE: 0.7 mg/dL (ref 0.6–1.1)
Chloride: 104 mEq/L (ref 98–109)
EGFR: >90 mL/min/{1.73_m2} (ref >90)
Glucose: 86 mg/dl (ref 70–140)
POTASSIUM: 4.2 meq/L (ref 3.5–5.1)
Sodium: 136 mEq/L (ref 136–145)
Total Bilirubin: 0.86 mg/dL (ref 0.20–1.20)
Total Protein: 6.3 g/dL — ABNORMAL LOW (ref 6.4–8.3)

## 2016-12-14 ENCOUNTER — Ambulatory Visit
Admission: RE | Admit: 2016-12-14 | Discharge: 2016-12-14 | Disposition: A | Discharge status: Home / Self Care | Payer: Medicare HMO | Source: Ambulatory Visit | Attending: Radiation Oncology | Admitting: Radiation Oncology

## 2016-12-14 ENCOUNTER — Inpatient Hospital Stay: Admission: RE | Admit: 2016-12-14 | Payer: Self-pay | Source: Ambulatory Visit | Admitting: Radiation Oncology

## 2016-12-14 DIAGNOSIS — C7951 Secondary malignant neoplasm of bone: Principal | ICD-10-CM

## 2016-12-14 DIAGNOSIS — C50911 Malignant neoplasm of unspecified site of right female breast: Secondary | ICD-10-CM

## 2016-12-14 NOTE — Progress Notes (Signed)
The patient came today she has follow-up scheduled with. I just seen her for her one month follow up on 11/08/2016 in which time she was doing very well clinically. She did have some episodes of epistasis which was felt to be less likely due to radiotherapy and more likely due to dry mucous membranes. She was offered supportive approaches and suggestion for this. She has since been back to see Dr. Burr Medico. She continues to do well and is not experiencing any visual acuity changes or loss of vision. She will follow up with [place] as needed.    Carola Rhine, PAC

## 2016-12-14 NOTE — Progress Notes (Signed)
Left orbital metastasis

## 2017-01-01 ENCOUNTER — Ambulatory Visit (HOSPITAL_COMMUNITY)
Admission: RE | Admit: 2017-01-01 | Discharge: 2017-01-01 | Disposition: A | Discharge status: Home / Self Care | Payer: Medicare HMO | Source: Ambulatory Visit | Attending: Family Medicine | Admitting: Family Medicine

## 2017-01-01 ENCOUNTER — Encounter: Payer: Self-pay | Admitting: Family Medicine

## 2017-01-01 ENCOUNTER — Ambulatory Visit (INDEPENDENT_AMBULATORY_CARE_PROVIDER_SITE_OTHER): Payer: Medicare HMO | Admitting: Family Medicine

## 2017-01-01 VITALS — BP 134/76 | HR 68 | Temp 98.3°F | Resp 16 | Wt 197.0 lb

## 2017-01-01 DIAGNOSIS — M79672 Pain in left foot: Secondary | ICD-10-CM | POA: Diagnosis not present

## 2017-01-01 DIAGNOSIS — M7732 Calcaneal spur, left foot: Secondary | ICD-10-CM | POA: Insufficient documentation

## 2017-01-01 NOTE — Progress Notes (Signed)
Pre visit review using our clinic review tool, if applicable. No additional management support is needed unless otherwise documented below in the visit note. 

## 2017-01-01 NOTE — Patient Instructions (Signed)
Elevate foot frequently Ice to left foot 20 minutes 3-4 times daily Go over to [facility]  for X-ray of left foot.

## 2017-01-01 NOTE — Progress Notes (Signed)
Subjective:     Patient ID: Margaret Rubio, female   DOB: [birth year], 76 y.o.   MRN: [id number]  HPI Patient seen for Saturday acute visit with onset last night of left foot pain. She went to bed around 9 PM and while she was in bed she states she had what felt like a "cramp" in her foot which was poorly localized. She went to the bathroom and had difficulty bearing weight with pain mostly on the dorsum left foot. She has some swelling this morning. No specific injury. Pain is sharp. Minimal relief with Tylenol. Pain is 4 out of 10 at rest. Did not note any color changes. Mild edema.  Past Medical History:  Diagnosis Date  . 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 ASTHMA 01/31/2008  . Breast cancer [facility]) dx 2009   right breast  . BREAST CANCER, HX OF 11/14/2009  . BRONCHITIS, ACUTE 01/31/2008  . Cough 10/17/2009  . FATIGUE 11/14/2009  . GANGLION CYST 05/01/2009  . HYPERLIPIDEMIA 05/21/2007  . HYPERTENSION 05/05/2007  . LOW BACK PAIN 05/21/2007  . MENOPAUSAL DISORDER 05/22/2010  . Pain in joint, lower leg 05/10/2008  . RASH-NONVESICULAR 05/01/2009  . SHOULDER PAIN, LEFT 11/14/2009  . 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 VITAMIN B12 DEFICIENCY 05/23/2010   Past Surgical History:  Procedure Laterality Date  . OTHER SURGICAL HISTORY     fibroid removal  . removal of neck lump     benigh 1986    reports that she has never smoked. She has never used smokeless tobacco. She reports that she does not drink alcohol or use drugs. family history includes Asthma in her other; Cancer in her paternal aunt; Diabetes in her other; Heart disease in her father; Hypertension in her father and other. Allergies  Allergen Reactions  . Tomato Hives     Review of Systems  Constitutional: Negative for chills and fever.  Respiratory: Negative for shortness of breath.   Cardiovascular: Negative for chest pain.  Neurological: Negative for weakness and numbness.       Objective:   Physical Exam  Constitutional: She  appears well-developed and well-nourished.  Cardiovascular: Normal rate.   Pulmonary/Chest: Effort normal. She has no wheezes. She has no rales.  Musculoskeletal:  Patient has very mild edema left foot compared to right. No ecchymosis. No warmth. No erythema. She has some tenderness over the midshaft of the fourth metatarsal but her tenderness is somewhat inconsistent when reexamining. She has good distal pulses and excellent capillary refill throughout. No skin ulcerations or skin breakdown. No cellulitis changes.  No calf tenderness       Assessment:     Acute left foot pain without known injury    Plan:     -Plain films left foot to further assess -Ice and elevation -continue with Tylenol as needed for discomfort.  Eulas Post MD [facility]

## 2017-01-03 ENCOUNTER — Telehealth: Payer: Self-pay | Admitting: Hematology

## 2017-01-03 ENCOUNTER — Other Ambulatory Visit: Payer: Medicare HMO

## 2017-01-03 ENCOUNTER — Other Ambulatory Visit (HOSPITAL_BASED_OUTPATIENT_CLINIC_OR_DEPARTMENT_OTHER): Payer: Medicare HMO

## 2017-01-03 DIAGNOSIS — Z170 Estrogen receptor positive status [ER+]: Principal | ICD-10-CM

## 2017-01-03 DIAGNOSIS — C50511 Malignant neoplasm of lower-outer quadrant of right female breast: Secondary | ICD-10-CM

## 2017-01-03 LAB — COMPREHENSIVE METABOLIC PANEL
ALT: 76 U/L — ABNORMAL HIGH (ref 0–55)
AST: 120 U/L — AB (ref 5–34)
Albumin: 3.3 g/dL — ABNORMAL LOW (ref 3.5–5.0)
Alkaline Phosphatase: 193 U/L — ABNORMAL HIGH (ref 40–150)
Anion Gap: 8 mEq/L (ref 3–11)
BUN: 8.5 mg/dL (ref 7.0–26.0)
CALCIUM: 9.1 mg/dL (ref 8.4–10.4)
CHLORIDE: 102 meq/L (ref 98–109)
CO2: 25 mEq/L (ref 22–29)
Creatinine: 0.8 mg/dL (ref 0.6–1.1)
EGFR: 78 mL/min/{1.73_m2} — AB (ref >90)
Glucose: 88 mg/dl (ref 70–140)
POTASSIUM: 4 meq/L (ref 3.5–5.1)
SODIUM: 135 meq/L — AB (ref 136–145)
Total Bilirubin: 1.79 mg/dL — ABNORMAL HIGH (ref 0.20–1.20)
Total Protein: 6.9 g/dL (ref 6.4–8.3)

## 2017-01-03 LAB — CBC WITH DIFFERENTIAL/PLATELET
BASO%: 0.3 % (ref 0.0–2.0)
Basophils Absolute: 0 10*3/uL (ref 0.0–0.1)
EOS%: 1.1 % (ref 0.0–7.0)
Eosinophils Absolute: 0 10*3/uL (ref 0.0–0.5)
HEMATOCRIT: 33.1 % — AB (ref 34.8–46.6)
HGB: 11.2 g/dL — ABNORMAL LOW (ref 11.6–15.9)
LYMPH#: 1 10*3/uL (ref 0.9–3.3)
LYMPH%: 27.1 % (ref 14.0–49.7)
MCH: 32 pg (ref 25.1–34.0)
MCHC: 33.8 g/dL (ref 31.5–36.0)
MCV: 94.6 fL (ref 79.5–101.0)
MONO#: 0.2 10*3/uL (ref 0.1–0.9)
MONO%: 5.1 % (ref 0.0–14.0)
NEUT%: 66.4 % (ref 38.4–76.8)
NEUTROS ABS: 2.3 10*3/uL (ref 1.5–6.5)
Platelets: 187 10*3/uL (ref 145–400)
RBC: 3.5 10*6/uL — ABNORMAL LOW (ref 3.70–5.45)
RDW: 14.5 % (ref 11.2–14.5)
WBC: 3.5 10*3/uL — AB (ref 3.9–10.3)

## 2017-01-03 NOTE — Telephone Encounter (Signed)
Per patient request wanted to r/s appt on 3/29 to later time on the same day.

## 2017-01-04 LAB — CANCER ANTIGEN 27.29: CAN 27.29: 815.9 U/mL — AB (ref 0.0–38.6)

## 2017-01-04 NOTE — Progress Notes (Signed)
[place]  [phone] Fax:([phone]) [phone]  Clinic follow up Note   Patient Care Team: [name], MD as PCP - General Truitt Merle, MD as Consulting Physician (Hematology)  01/06/2017  CHIEF COMPLAINTS Follow up breast cancer     Breast cancer of lower-outer quadrant of right female breast ([place])   09/03/2008 Initial Diagnosis    right breast cancer, T2N1Mo, stage IIA, ER+, PR+, HER2+ (ration 2.07). Pt declined surgery or other treatment.        05/20/2011 Tumor Marker    right breast mass biopsy showed IDA, ER+/PR+/HER2- (ratio 1.37)      09/04/2011 Cancer Staging    PET scan showed no distant mets      10/08/2014 Progression     lumbar spine MRI showed multiple bone metastasis , highly suspicious for  metastatic disease.      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 Increased size of right pleural effusion, indeterminate for malignant involvement of the pleural fluids.      01/20/2016 Imaging    CT CAP with contrast showed small bilateral pleural effusion, right greater than left, similar osseous metastasis, no evidence of extraosseous metastasis. Bone scan showed no areas of suspicious uptake.      09/27/2016 Imaging    CT CAP w contrast 1. Heterogeneous perfusion of the liver identified on today's study with some areas of hypoattenuating nodularity. This raises concern for development of liver metastases although this is not a definite finding today may simply reflect heterogeneous perfusion. MRI of the abdomen  without and with contrast would prove helpful to further evaluate, as clinically warranted. 2. Imaging features suggest bony metastatic disease, stable in the interval. Given lack of uptake on bone scan performed earlier today, these bony changes likely reflect treated disease. 3. Persistent marked skin thickening and edema diffusely in the right breast tracking into the region of the right axilla. Not substantially changed in the interval. 4. Interval resolution of small bilateral pleural effusions with posterior right lower lobe collapse/consolidation.      10/10/2016 Imaging    MR Abdomen w/wo contrast 1. Unusual perfusion abnormality involving the liver could be related to prior radiation effect but I do not see any MR findings suspicious for hepatic metastatic disease. The portal and hepatic veins are patent. 2. Stable surgical and radiation changes involving the right breast. No significant change in 21.5 mm irregular enhancing right lateral breast lesion. 3. Diffuse osseous metastatic disease.      10/13/2016 - 10/22/2016 Radiation Therapy    SRS treatment 5 fractions to the left orbit bone metastasis with Dr. MTammi Klippel        HISTORY OF PRESENTING ILLNESS (10/30/2014):  Margaret LOLIVIANNA HIGLEY76y.o. female  Was referred by her primary care physician to discuss the management probable metastatic breast cancer.  She was initially diagnosed with right breast cancer in November 2009. 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  In December of 2009, she underwent a right axillary lymph node biopsy on 09/24/2008 which showed metastatic ductal carcinoma. (Case No: [id number]) The tumor was ER 100% positive, PR 0% negative, Ki67 - 22%. 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 The ratio of HER-2:CEP 17 was 2.07.  She decided to pursue nature therapy by diet and exercise, and did not have  surgery and other tranditional therapy for her breast cancer.   In May 2011 she underwent a mammogram that showed a 3.8 cm invasive duct carcinoma in the subareolar region of the right breast which had increased since November 2009.  The tumor was ER  99% positive,  PR 4% positive, HER-2 negative ( ratio 1.37).  She was seen by Dr. Estella Husk and Dr. Lucia Gaskins.  However, she failed multiple appointments and sought alternative medical care in [place]. This involved putting something on her right breast that caused some scarring.  She noticed the enlarged right breast mass and nipple inversion about one year ago. No pain, but some skin change. She developed left low back/buttock and hip and  pain in Nov 2015, was seen by PCP Dr. Jenny Reichmann who prescribed vicodin and tramadol and pain improved with meds. Due to the persistent pain, she underwent a lumber MRI  On 10/08/2014, which showed multiple bone metastasis.  She run out pain medication about a week ago,  But did not call for refill. She states her pain is not bad , but she appears to be uncomfortable when she stands up and walks.  She otherwise feels well overall,  Has good appetite and energy level. She lives alone and still functions well.  She comes in with her niece today.   Current therapy:  1.Anastrozole 1 mg once daily started on 12/10/2014 2. Ibrance added on 07/29/15, stopped in Nov 2016, restarted on 01/10/2016, held during her radiation in 09/2016 3. Xgeva started on 08/15/2015, changed to every 3 months after 09/29/2016   INTERIM HISTORY:  Margaret Rubio returns for follow-up. She is doing okay. She has been experiencing nausea and fatigue which is unusual for her. She has also experienced pain with her foot and could barely walk. The pain was so bad she could barely walk. The pain has improved now but her ankles are still a little swollen with a little pain in front of lower leg. She denies any fever, abdominal pain or diarrhea but admits to  having pain on right side. Vision has improved and no headaches.    MEDICAL HISTORY:  Past Medical History:  Diagnosis Date  . 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 ASTHMA 01/31/2008  . Breast cancer [facility]) dx 2009   right breast  . BREAST CANCER, HX OF 11/14/2009  . BRONCHITIS, ACUTE 01/31/2008  . Cough 10/17/2009  . FATIGUE 11/14/2009  . GANGLION CYST 05/01/2009  . HYPERLIPIDEMIA 05/21/2007  . HYPERTENSION 05/05/2007  . LOW BACK PAIN 05/21/2007  . MENOPAUSAL DISORDER 05/22/2010  . Pain in joint, lower leg 05/10/2008  . RASH-NONVESICULAR 05/01/2009  . SHOULDER PAIN, LEFT 11/14/2009  . 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 VITAMIN B12 DEFICIENCY 05/23/2010    SURGICAL HISTORY: Past Surgical History:  Procedure Laterality Date  . OTHER SURGICAL HISTORY     fibroid removal  . removal of neck lump     benigh 1986    Social History   Social History  . Marital status: Widowed    Spouse name: N/A  . Number of children: 1  . Years of education: N/A   Social History  Main Topics  . Smoking status: Never Smoker  . Smokeless tobacco: Never Used  . Alcohol use No  . Drug use: No  . Sexual activity: No   Other Topics Concern  . None   Social History Narrative  . None    FAMILY HISTORY: Family History  Problem Relation Age of Onset  . Hypertension Father   . Heart disease Father   . Asthma Other   . Diabetes Other   . Hypertension Other   . Cancer Paternal Aunt     leukemia ?  Paternal aunt had cancer, unknown type. No other family history of malignancy   ALLERGIES:  is allergic to tomato.  MEDICATIONS:  Current Outpatient Prescriptions on File Prior to Visit  Medication Sig Dispense Refill  . anastrozole (ARIMIDEX) 1 MG tablet Take 1 tablet (1 mg total) by mouth daily. 30 tablet 5  . Ascorbic Acid (VITAMIN C) 100 MG CHEW Chew 1 tablet (100 mg total) by mouth every morning. 90 each 3  . calcium carbonate 1250 MG capsule Take 1,250 mg by mouth 2 (two) times daily with a meal. Not  sure of type or dose    . cholecalciferol (VITAMIN D) 1000 units tablet Take 1,000 Units by mouth daily. Patient not sure of dose    . clotrimazole (GYNE-LOTRIMIN 3) 2 % vaginal cream Apply to affected bilaterall axilla sites as needed for rash. 21 g 1  . Cyanocobalamin (B-12 COMPLIANCE INJECTION IJ) Inject as directed every 30 (thirty) days.    . Nystatin POWD 1 apply to right axilla twice daily until rashes resolves 1 Bottle 1  . palbociclib (IBRANCE) 125 MG capsule TAKE 1 CAPSULE BY MOUTH ONCE DAILY WITH BREAKFAST. TAKE WHOLE WITH FOOD FOR 21 DAYS. REPEAT EVERY 28 DAYS 21 capsule 2   No current facility-administered medications on file prior to visit.   ; REVIEW OF SYSTEMS:   Constitutional: Denies fevers, chills or abnormal night sweats, (+) ankle swelling, increased appetite Eyes: Denies blurriness of vision, double vision or watery eyes Ears, nose, mouth, throat, and face: Denies mucositis or sore throat,  Respiratory: Denies cough, dyspnea or wheezes Cardiovascular: Denies palpitation, chest discomfort or lower extremity swelling Gastrointestinal:  Denies nausea, heartburn or change in bowel habits (+) gassy Skin: Denies abnormal skin rashes  Lymphatics: Denies new lymphadenopathy or easy bruising Neurological:Denies numbness, tingling or new weaknesses Behavioral/Psych: Mood is stable, no new changes  All other systems were reviewed with the patient and are negative.  PHYSICAL EXAMINATION:  ECOG PERFORMANCE STATUS: 1 BP 120/72 (BP Location: Left Arm, Patient Position: Sitting)   Pulse 83   Temp 97.6 F (36.4 C) (Oral)   Resp 18   Ht _0  (1.651 m)   Wt 196 lb 8 oz (89.1 kg)   SpO2 99%   BMI 32.70 kg/m   GENERAL:alert, no distress and comfortable SKIN: skin color, texture, turgor are normal, no significant lesions  EYES: normal, conjunctiva are pink and non-injected, sclera clear OROPHARYNX:no exudate, no erythema and lips, buccal mucosa, and tongue normal  NECK: supple,  thyroid normal size, non-tender, without nodularity LYMPH:  no palpable lymphadenopathy in the cervical, axillary or inguinal LUNGS: clear to auscultation and percussion with normal breathing effort, slightly decreased breath sounds at the bottom of right lung. HEART: regular rate & rhythm and no murmurs and no lower extremity edema ABDOMEN:abdomen soft, non-tender and normal bowel sounds (+) pain Musculoskeletal:no cyanosis of digits and no clubbing, (+) mild edema on both legs, up to  knee, L>R PSYCH: alert & oriented x 3 with fluent speech NEURO: no focal motor/sensory deficits. Her left eye ball has limited range of movement  Breasts: Breast inspection showed them to be symmetrical with no nipple discharge. Palpation of the breasts and axilla revealed no obvious mass that I could appreciate.\   LABORATORY DATA:  I have reviewed the data as listed CBC Latest Ref Rng & Units 01/03/2017 12/08/2016 11/30/2016  WBC 3.9 - 10.3 10e3/uL 3.5(L) 2.6(L) 1.6(L)  Hemoglobin 11.6 - 15.9 g/dL 11.2(L) 10.1(L) 9.7(L)  Hematocrit 34.8 - 46.6 % 33.1(L) 30.4(L) 29.1(L)  Platelets 145 - 400 10e3/uL 187 200 143(L)    CMP Latest Ref Rng & Units 01/03/2017 12/08/2016 11/30/2016  Glucose 70 - 140 mg/dl 88 86 85  BUN 7.0 - 26.0 mg/dL 8.5 8.2 10.5  Creatinine 0.6 - 1.1 mg/dL 0.8 0.7 0.8  Sodium 136 - 145 mEq/L 135(L) 136 137  Potassium 3.5 - 5.1 mEq/L 4.0 4.2 4.0  Chloride 96 - 112 mEq/L - - -  CO2 22 - 29 mEq/L _0 Calcium 8.4 - 10.4 mg/dL 9.1 8.8 8.8  Total Protein 6.4 - 8.3 g/dL 6.9 6.3(L) 6.4  Total Bilirubin 0.20 - 1.20 mg/dL 1.79(H) 0.86 1.00  Alkaline Phos 40 - 150 U/L 193(H) 106 91  AST 5 - 34 U/L 120(H) 73(H) 52(H)  ALT 0 - 55 U/L 76(H) 45 33   CA27.29: 06/30/2012: 28 10/30/2014: 355 08/15/2015: 424 12/27/2014: 414 05/05/2016: 416.1 06/10/2016: 332.4 07/01/2016: 348.8 08/03/2016: 388.1 09/01/2016: 321.1 10/26/16: 404.3 11/30/16: 403.7 01/03/17: 815.9  PATHOLOGY REPORT  RIGHT BREAST MASS, 6  O'CLOCK, NEEDLE CORE BIOPSIES: 09/03/2008 MICROSCOPIC FOCUS OF DUCTAL CARCINOMA IN SITU ASSOCIATED WITH MICROSCOPIC FOCUS OF MICROINVASION. SEE COMMET.  LYMPH NODE, RIGHT AXILLARY, NEEDLE CORE BIOPSIES: METASTATIC DUCTAL CARCINOMA.  09/24/2008  COMMENT There are needle core biopsies of lymph node showing partial replacement by metastatic adenocarcinoma consistent with metastasis from a breast primary, metastatic ductal carcinoma. Since the breast prognostic profile is not performed on the previous material because of limited invasive tumor ([id number]), the breast prognostic profile will be performed on the tumor in the lymph node and reported in a separate report. Interpretation HER-2/NEU BY CISH -- SHOWS AMPLIFICATION BY CISH ANALYSIS. THE RATIO OF HER-2: CEP 17 SIGNALS WAS 2.07  Diagnosis 05/20/2011 Breast, right, needle core biopsy - INVASIVE DUCTAL CARCINOMA, SEE COMMENT. - LYMPHOVASCULAR INVASION PRESENT. Estrogen Receptor (Negative, <1%): 99%, STRONG STAINING INTENSITY Progesterone Receptor (Negative, <1%): 4%, STRONG HER-2/NEU BY CISH - NO AMPLIFICATION OF HER-2 DETECTED. THE RATIO OF HER-2: CEP 17 SIGNALS WAS 1.38.  Breast, right, needle core biopsy, 6:30 o'clock 12/13/2014 - INVASIVE MAMMARY CARCINOMA. - SEE MICROSCOPIC DESCRIPTION. Microscopic Comment The features favor Grade II invasive ductal carcinoma. A breast prognostic profile will be performed. Dr. Donato Heinz agrees. The results were called to [facility] on 12/16/14. (JDP:ds 12/16/14) ADDENDUM: Immunohistochemistry for E-cadherin is performed and shows diffuse strong positivity consistent with invasive ductal carcinoma. Estrogen Receptor: 100%, POSITIVE, STRONG STAINING INTENSITY Progesterone Receptor: 2%, POSITIVE, STRONG STAINING INTENSITY Proliferation Marker Ki67: 27% HER-2/NEU BY CISH - NEGATIVE. RESULT RATIO OF HER2: CEP 17 SIGNALS 1.45 AVERAGE HER2 COPY NUMBER PER CELL  2.25  Diagnosis 08/01/2015 PLEURAL FLUID, RIGHT(SPECIMEN 1 OF 1 COLLECTED 08/01/15): METASTATIC CARCINOMA CONSISTENT WITH BREAST PRIMARY.   RADIOGRAPHIC STUDIES: I have personally reviewed the radiological images as listed and agreed with the findings in the report.  PET 07/17/2015 IMPRESSION: 1. Suspected active diverticulitis along the sigmoid colon, with  focal mesenteric stranding and a small locule of gas which is either within an inflamed diverticulum or a micro perforation. No overt free air. 2. Overall similar appearance of multifocal right breast cancer with extensive osseous metastatic disease. The right axillary lymph nodes are less notable that there is a borderline hypermetabolic left axillary lymph node currently. 3. Increasing size of the right pleural effusion, now moderate. Appearance indeterminate for malignant involvement of the pleural fluid. 4. Faintly increased activity in a right infrahilar lymph node which could represent early metastatic spread. 5. Other imaging findings of potential clinical significance: Chronic left maxillary sinusitis. Low-density blood pool suggests anemia. Left adrenal adenoma. These results will be called to the ordering clinician or representative by the Radiologist Assistant, and communication documented in the PACS or zVision Dashboard.  Brain MRI with and without contrast on 06/27/2015, at [facility] -In determinate, ill-defined soft tissue infiltration in the left orbit with questionable enhancement, further evaluation with CT orbits with contrast is recommended to the cyst in ruling out metastasis. -No intracranial metastasis -Diffusely hypoechoic intense bone marrow signal throughout the skull base   Bone scan 05/31/2016 IMPRESSION: New increased up take in the ribs bilaterally worrisome for metastatic disease. Stable increased uptake in the right fronto poor parietal bone.  A  bilateral rib series is recommended.   CT chest, abdomen and pelvis with contrast 05/31/2016 IMPRESSION: 1. Similar fit appearance of widespread it treated osseous metastatic disease, no progression. No findings of new extra osseous metastatic disease. 2. Continued skin thickening and edema in the right breast. The edema in the right breast and tracking in the right chest wall is slightly increased compared to prior and the lateral soft tissue density in the right lateral breast is stable. 3. Similar small bilateral pleural effusions with some atelectasis at the right lung base possibly tethered to the parietal pleura. 4. Mild coronary artery atherosclerosis and atherosclerotic calcification of the aortic arch. 5. Airway thickening is present, suggesting bronchitis or reactive airways disease.  MR orbits w/wo contrast 07/22/2016  IMPRESSION: 1. Diffuse metastasis within the left orbit intraconal space, which likely invades the left extraocular muscles. No abnormal enhancement of the left optic nerve to strongly indicate nerve invasion although there does appear to be abnormal T2 signal within the substance of the L CN2, such as due to neuritis. The left globe appears spared. 2. No other soft tissue metastasis identified, an the right orbit appears normal. Evidence of osseous metastatic disease in the cervical spine and possibly also the calvarium.  CT CAP W Contrast 09/28/16 IMPRESSION: 1. Heterogeneous perfusion of the liver identified on today's study with some areas of hypoattenuating nodularity. This raises concern for development of liver metastases although this is not a definite finding today may simply reflect heterogeneous perfusion. MRI of the abdomen without and with contrast would prove helpful to further evaluate, as clinically warranted. 2. Imaging features suggest bony metastatic disease, stable in the interval. Given lack of uptake on bone scan performed  earlier today, these bony changes likely reflect treated disease. 3. Persistent marked skin thickening and edema diffusely in the right breast tracking into the region of the right axilla. Not substantially changed in the interval. 4. Interval resolution of small bilateral pleural effusions with posterior right lower lobe collapse/consolidation.   MR Abdomen w/wo contrast 10/10/2016 IMPRESSION: 1. Unusual perfusion abnormality involving the liver could be related to prior radiation effect but I do not see any MR findings suspicious for  hepatic metastatic disease. The portal and hepatic veins are patent. 2. Stable surgical and radiation changes involving the right breast. No significant change in 21.5 mm irregular enhancing right lateral breast lesion. 3. Diffuse osseous metastatic disease.  ASSESSMENT & PLAN:   Mrs. Merfeld is a 76 y.o. African-American female, who was diagnosed with node positive right breast  Invasive adenocarcinoma in  2009, untreated,  Now presents with low back pain and lumbar spine MRI is highly suspicious for bone metastasis.  1. 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 -she knows that her disease at this stage is incurable. The goal of treatment is palliative and to prolong her life. -she is on first line anastrozole, Leslee Home was added on in October 2016 due to slight disease progression. She was not very compliant, off Ibrance for 3-4 months and restarted in 01/2016. -I previously discussed her restaging CT and bone scan from 05/31/2016, which showed diffuse similar appearing  bone lesions, no other new metastasis. Bone scan showed mild bilateral rib uptake, but no corresponding findings on the CT. I think she has stable disease overall. -I previously reviewed her restaging CT scan and bone scan from 09/27/2016, which showed stable bone mets. CT scan showed heterogeneous perfusion of the liver indeterminate -I previously reviewed MR abdomen scan from 10/10/2016 with the patient, which showed no evidence of metastatic disease in liver  -she has developed some nausea and fatigue lately, lab reviewed, he has developed transaminitis and hyperbilirubinemia, I'll obtain a restaging CT scan to ruled out disease progression, especially liver metastasis. -She'll continue anastrozole and Ibrance for now.  2. Left orbit bone metastasis  -this was found on her brain MRI in 06/2015 when she had heaache, she has issues with left eye ball movement, was evaluated by Dr. Emilio Math at [facility] -this is likely a metastatic lesion from her breast cancer -I previously discussed her orbital MRI scan findings, which showed diffuse metastases to status is within the left orbital with invading the left extraocular muscles. No optical nerve involvement. -She previously completed treatment with radiation oncologist Dr. Tammi Klippel, who performed 5 session of SRS to her left orbit bone metastasis   3. Low back/ hip pain,  Secondary to metastatic cancer to bones  -this has resolved since she started anastrozole -continue Xgeva every 3 months, next due today  4. Anemia -Likely secondary to her breast cancer, bone metastases and B12 deficiency -Slightly improved after B12 injections. Continue B12 monthly for now -No need for transfusion. We'll follow up closely. -The was previously neutropenic because she took 2 extra doses of Ibrance.  5. Mild transaminitis and hyperbilirubinemia -She has had slightly increased liver enzymes in the past, worse lately  -CT scan on 09/27/16 showed heterogeneous perfusion in  the liver. -Liver MRI on 10/10/16 showed unusual perfusion abnormality involving the liver, but no definite evidence of metastasis. -We'll repeat staging CT scan, ruled out disease progression.  6. Goal of care discussion  -We previously discussed the incurable nature of her cancer, and the overall poor prognosis, especially if he does not have good response to treatment or progress on treatment -  The patient understands the goal of care is palliative. -I previously  recommend DNR/DNI, she will think about it   7. Nausea and fatigue -I cording comes in for her she will take as needed for nausea -I'll also encourage her to try Maalox for her gastric discomfort  8. LE edema -She has recently developed left foot pain and swollen, on exam she has bilateral lower extremity edema, mild, more prominent in the left -Her x-ray was negative -I will obtain a bilateral lower extremity Doppler to ruled out DVT.   Plan:  - Doppler of LE  tomorrow to rule out DVT  Lab and CT and bone scan in 1-2 weeks  - f/u after her scan   All questions were answered. The patient knows to call the clinic with any problems, questions or concerns.  I spent 25 minutes counseling the patient face to face. The total time spent in the appointment was 30 minutes and more than 50% was on counseling.  This document serves as a record of services personally performed by Truitt Merle, MD. It was created on her behalf by Brandt Loosen, a trained medical scribe. The creation of this record is based on the scribe's personal observations and the provider's statements to them. This document has been checked and approved by the attending provider.     Truitt Merle, MD 01/06/2017

## 2017-01-06 ENCOUNTER — Telehealth: Payer: Self-pay | Admitting: Hematology

## 2017-01-06 ENCOUNTER — Ambulatory Visit: Payer: Medicare HMO

## 2017-01-06 ENCOUNTER — Encounter: Payer: Self-pay | Admitting: Hematology

## 2017-01-06 ENCOUNTER — Ambulatory Visit: Payer: Medicare HMO | Admitting: Hematology

## 2017-01-06 ENCOUNTER — Ambulatory Visit (HOSPITAL_BASED_OUTPATIENT_CLINIC_OR_DEPARTMENT_OTHER): Payer: Medicare HMO | Admitting: Hematology

## 2017-01-06 VITALS — BP 120/72 | HR 83 | Temp 97.6°F | Resp 18 | Ht 65.0 in | Wt 196.5 lb

## 2017-01-06 DIAGNOSIS — D519 Vitamin B12 deficiency anemia, unspecified: Secondary | ICD-10-CM

## 2017-01-06 DIAGNOSIS — M7989 Other specified soft tissue disorders: Secondary | ICD-10-CM

## 2017-01-06 DIAGNOSIS — C50511 Malignant neoplasm of lower-outer quadrant of right female breast: Secondary | ICD-10-CM

## 2017-01-06 DIAGNOSIS — I10 Essential (primary) hypertension: Secondary | ICD-10-CM | POA: Diagnosis not present

## 2017-01-06 DIAGNOSIS — R740 Nonspecific elevation of levels of transaminase and lactic acid dehydrogenase [LDH]: Secondary | ICD-10-CM | POA: Diagnosis not present

## 2017-01-06 DIAGNOSIS — C782 Secondary malignant neoplasm of pleura: Secondary | ICD-10-CM | POA: Diagnosis not present

## 2017-01-06 DIAGNOSIS — E538 Deficiency of other specified B group vitamins: Secondary | ICD-10-CM

## 2017-01-06 DIAGNOSIS — G893 Neoplasm related pain (acute) (chronic): Secondary | ICD-10-CM | POA: Diagnosis not present

## 2017-01-06 DIAGNOSIS — Z170 Estrogen receptor positive status [ER+]: Secondary | ICD-10-CM | POA: Diagnosis not present

## 2017-01-06 DIAGNOSIS — M79662 Pain in left lower leg: Secondary | ICD-10-CM

## 2017-01-06 DIAGNOSIS — C7951 Secondary malignant neoplasm of bone: Secondary | ICD-10-CM

## 2017-01-06 DIAGNOSIS — D630 Anemia in neoplastic disease: Secondary | ICD-10-CM

## 2017-01-06 DIAGNOSIS — C50911 Malignant neoplasm of unspecified site of right female breast: Secondary | ICD-10-CM

## 2017-01-06 MED ORDER — PROCHLORPERAZINE MALEATE 5 MG PO TABS: 5.0000 mg | ORAL_TABLET | Freq: Three times a day (TID) | ORAL | 0 refills | Status: DC | PRN

## 2017-01-06 MED ORDER — METHYLPREDNISOLONE SODIUM SUCC 125 MG IJ SOLR: 125.0000 mg | Freq: Once | INTRAMUSCULAR | Status: DC | PRN

## 2017-01-06 MED ORDER — CYANOCOBALAMIN 1000 MCG/ML IJ SOLN
1000.0000 ug | Freq: Once | INTRAMUSCULAR | Status: AC
  Administered 2017-01-06: 1000 ug via INTRAMUSCULAR

## 2017-01-06 MED ORDER — DENOSUMAB 120 MG/1.7ML ~~LOC~~ SOLN
120.0000 mg | Freq: Once | SUBCUTANEOUS | Status: AC
  Administered 2017-01-06: 120 mg via SUBCUTANEOUS
  Filled 2017-01-06: qty 1.7

## 2017-01-06 MED ORDER — ALBUTEROL SULFATE (2.5 MG/3ML) 0.083% IN NEBU
2.5000 mg | INHALATION_SOLUTION | Freq: Once | RESPIRATORY_TRACT | Status: DC | PRN
  Filled 2017-01-06: qty 3

## 2017-01-06 MED ORDER — CYANOCOBALAMIN 1000 MCG/ML IJ SOLN
INTRAMUSCULAR | Status: AC
  Filled 2017-01-06: qty 1

## 2017-01-06 MED ORDER — EPINEPHRINE HCL 0.1 MG/ML IJ SOLN: 0.2500 mg | Freq: Once | INTRAMUSCULAR | Status: DC | PRN

## 2017-01-06 MED ORDER — DIPHENHYDRAMINE HCL 50 MG/ML IJ SOLN: 50.0000 mg | Freq: Once | INTRAMUSCULAR | Status: DC | PRN

## 2017-01-06 MED ORDER — SODIUM CHLORIDE 0.9 % IV SOLN: Freq: Once | INTRAVENOUS | Status: DC | PRN

## 2017-01-06 MED ORDER — DIPHENHYDRAMINE HCL 50 MG/ML IJ SOLN: 25.0000 mg | Freq: Once | INTRAMUSCULAR | Status: DC | PRN

## 2017-01-06 MED FILL — PROCHLORPERAZINE 5 MG TAB: 5 | 5 days supply | Qty: 30 | Fill #0

## 2017-01-06 NOTE — Telephone Encounter (Signed)
Gave patient AVS and calender per 01/06/2017 los. [facility] to contact patient with CT schedule.  

## 2017-01-07 ENCOUNTER — Ambulatory Visit (HOSPITAL_COMMUNITY)
Admission: RE | Admit: 2017-01-07 | Discharge: 2017-01-07 | Disposition: A | Discharge status: Home / Self Care | Payer: Medicare HMO | Source: Ambulatory Visit | Attending: Hematology | Admitting: Hematology

## 2017-01-07 ENCOUNTER — Other Ambulatory Visit: Payer: Self-pay | Admitting: Hematology

## 2017-01-07 ENCOUNTER — Telehealth: Payer: Self-pay | Admitting: *Deleted

## 2017-01-07 DIAGNOSIS — M7989 Other specified soft tissue disorders: Secondary | ICD-10-CM | POA: Diagnosis not present

## 2017-01-07 DIAGNOSIS — M79662 Pain in left lower leg: Secondary | ICD-10-CM | POA: Insufficient documentation

## 2017-01-07 DIAGNOSIS — R609 Edema, unspecified: Secondary | ICD-10-CM | POA: Diagnosis not present

## 2017-01-07 MED FILL — IBRANCE 125 MG CAPSULE: 125 | 28 days supply | Qty: 21 | Fill #1

## 2017-01-07 MED FILL — ANASTROZOLE 1 MG TABLET: 1 | 30 days supply | Qty: 30 | Fill #1

## 2017-01-07 NOTE — Telephone Encounter (Signed)
Received call from [facility] with report that doppler was completley negative.  Pt reports that she is still having pain on tolp & bottom of L foot.  She is taking tylenol without much relief.  Informed tech that pt can cont tylenol or ibuprofen if she can take that & to try heat/cold to see if this will help.  Message to Dr Burr Medico.

## 2017-01-07 NOTE — Progress Notes (Signed)
VASCULAR LAB PRELIMINARY  PRELIMINARY  PRELIMINARY  PRELIMINARY  Bilateral lower extremity venous duplex completed.    Preliminary report:  Bilateral:  No evidence of DVT, superficial thrombosis, or Baker's Cyst.   Awanda Wilcock, RVS 01/07/2017, 1:32 PM

## 2017-01-15 ENCOUNTER — Telehealth: Payer: Self-pay | Admitting: Hematology

## 2017-01-15 NOTE — Telephone Encounter (Signed)
r/s lab appt for 4/19 per schedule message from [facility]. Patient is aware of new appt time.

## 2017-01-27 ENCOUNTER — Encounter (HOSPITAL_COMMUNITY): Payer: Medicare HMO

## 2017-01-27 ENCOUNTER — Ambulatory Visit (HOSPITAL_COMMUNITY): Admission: RE | Admit: 2017-01-27 | Payer: Medicare HMO | Source: Ambulatory Visit

## 2017-01-27 ENCOUNTER — Other Ambulatory Visit: Payer: Medicare HMO

## 2017-01-28 ENCOUNTER — Telehealth: Payer: Self-pay | Admitting: *Deleted

## 2017-01-28 ENCOUNTER — Ambulatory Visit (HOSPITAL_BASED_OUTPATIENT_CLINIC_OR_DEPARTMENT_OTHER): Payer: Medicare HMO | Admitting: Adult Health

## 2017-01-28 ENCOUNTER — Other Ambulatory Visit: Payer: Self-pay | Admitting: *Deleted

## 2017-01-28 ENCOUNTER — Encounter: Payer: Self-pay | Admitting: Adult Health

## 2017-01-28 ENCOUNTER — Ambulatory Visit (HOSPITAL_COMMUNITY)
Admission: RE | Admit: 2017-01-28 | Discharge: 2017-01-28 | Disposition: A | Discharge status: Home / Self Care | Payer: Medicare HMO | Source: Ambulatory Visit | Attending: Adult Health | Admitting: Adult Health

## 2017-01-28 VITALS — BP 147/80 | HR 78 | Temp 97.6°F | Resp 18 | Ht 65.0 in | Wt 193.9 lb

## 2017-01-28 DIAGNOSIS — Z79811 Long term (current) use of aromatase inhibitors: Secondary | ICD-10-CM

## 2017-01-28 DIAGNOSIS — M79601 Pain in right arm: Secondary | ICD-10-CM

## 2017-01-28 DIAGNOSIS — C7951 Secondary malignant neoplasm of bone: Secondary | ICD-10-CM

## 2017-01-28 DIAGNOSIS — C782 Secondary malignant neoplasm of pleura: Secondary | ICD-10-CM

## 2017-01-28 DIAGNOSIS — Z170 Estrogen receptor positive status [ER+]: Secondary | ICD-10-CM

## 2017-01-28 DIAGNOSIS — R600 Localized edema: Secondary | ICD-10-CM

## 2017-01-28 DIAGNOSIS — M25511 Pain in right shoulder: Secondary | ICD-10-CM | POA: Diagnosis not present

## 2017-01-28 DIAGNOSIS — C50511 Malignant neoplasm of lower-outer quadrant of right female breast: Secondary | ICD-10-CM

## 2017-01-28 DIAGNOSIS — R740 Nonspecific elevation of levels of transaminase and lactic acid dehydrogenase [LDH]: Secondary | ICD-10-CM | POA: Diagnosis not present

## 2017-01-28 DIAGNOSIS — M19011 Primary osteoarthritis, right shoulder: Secondary | ICD-10-CM | POA: Diagnosis not present

## 2017-01-28 DIAGNOSIS — D649 Anemia, unspecified: Secondary | ICD-10-CM | POA: Diagnosis not present

## 2017-01-28 DIAGNOSIS — M79621 Pain in right upper arm: Secondary | ICD-10-CM | POA: Diagnosis not present

## 2017-01-28 MED ORDER — TRAMADOL HCL 50 MG PO TABS: 50.0000 mg | ORAL_TABLET | Freq: Three times a day (TID) | ORAL | 0 refills | Status: DC

## 2017-01-28 NOTE — Telephone Encounter (Signed)
Spoke with pt and was informed that Right arm pain from elbow to shoulder for a couple of days now.  Denied redness, denied swelling, denied tender to touch, just very painful.  Pt also stated she had not taken Anastrozole and Ibrance for about a week - " I just forgot ".   Pt also cancelled her scheduled scans for 01/27/17. Gave pt radiology scheduler phone number to reschedule scans.  Office visit appt will be moved to after scans. Dr. Burr Medico notified; Ria Comment, NP notified.  Spoke with pt and instructed pt to come in now at 245 pm to see NP for arm pain.  Pt voiced understanding.

## 2017-01-28 NOTE — Telephone Encounter (Signed)
Call from pt reporting R arm pain "between elbow and shoulder, I can barely raise it." Pt denies any swelling or redness in arm. Pt stated several times "I just don't feel well." Pt is requested to be seen today. She doesn't feel comfortable waiting until next visit. Pt also reports worsening B pedal/ ankle edema. Message to MD for review.
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
## 2017-01-28 NOTE — Telephone Encounter (Signed)
-----   Message from Gardenia Phlegm, NP sent at 01/28/2017  5:01 PM EDT ----- Please call patient with normal results.  Tramadol over the weekend, would recommend she see urgent care or pcp if worsens.  There is no cancer related cause for her pain

## 2017-01-28 NOTE — Telephone Encounter (Signed)
Called pt daughter as pt is not at home and not at radiology. Called mobile # listed, lmovm for pt with NP instructions.

## 2017-01-31 ENCOUNTER — Telehealth: Payer: Self-pay | Admitting: Hematology

## 2017-01-31 ENCOUNTER — Ambulatory Visit: Payer: Medicare HMO | Admitting: Hematology

## 2017-01-31 NOTE — Telephone Encounter (Signed)
sw pt to confirm 4/26 appt at 0945 per LOS

## 2017-02-01 ENCOUNTER — Ambulatory Visit (HOSPITAL_COMMUNITY)
Admission: RE | Admit: 2017-02-01 | Discharge: 2017-02-01 | Disposition: A | Discharge status: Home / Self Care | Payer: Medicare HMO | Source: Ambulatory Visit | Attending: Hematology | Admitting: Hematology

## 2017-02-01 DIAGNOSIS — J9811 Atelectasis: Secondary | ICD-10-CM | POA: Insufficient documentation

## 2017-02-01 DIAGNOSIS — Z170 Estrogen receptor positive status [ER+]: Secondary | ICD-10-CM | POA: Diagnosis not present

## 2017-02-01 DIAGNOSIS — C50511 Malignant neoplasm of lower-outer quadrant of right female breast: Secondary | ICD-10-CM | POA: Diagnosis not present

## 2017-02-01 DIAGNOSIS — C7951 Secondary malignant neoplasm of bone: Secondary | ICD-10-CM | POA: Insufficient documentation

## 2017-02-01 DIAGNOSIS — K769 Liver disease, unspecified: Secondary | ICD-10-CM | POA: Insufficient documentation

## 2017-02-01 DIAGNOSIS — C50911 Malignant neoplasm of unspecified site of right female breast: Secondary | ICD-10-CM | POA: Diagnosis not present

## 2017-02-01 DIAGNOSIS — J90 Pleural effusion, not elsewhere classified: Secondary | ICD-10-CM | POA: Insufficient documentation

## 2017-02-01 MED ORDER — IOPAMIDOL (ISOVUE-300) INJECTION 61%
INTRAVENOUS | Status: AC
  Administered 2017-02-01: 100 mL
  Filled 2017-02-01: qty 100

## 2017-02-02 ENCOUNTER — Encounter (HOSPITAL_COMMUNITY)
Admission: RE | Admit: 2017-02-02 | Discharge: 2017-02-02 | Disposition: A | Discharge status: Home / Self Care | Payer: Medicare HMO | Source: Ambulatory Visit | Attending: Hematology | Admitting: Hematology

## 2017-02-02 DIAGNOSIS — Z853 Personal history of malignant neoplasm of breast: Secondary | ICD-10-CM | POA: Diagnosis not present

## 2017-02-02 DIAGNOSIS — C50511 Malignant neoplasm of lower-outer quadrant of right female breast: Secondary | ICD-10-CM | POA: Diagnosis not present

## 2017-02-02 DIAGNOSIS — Z170 Estrogen receptor positive status [ER+]: Secondary | ICD-10-CM | POA: Diagnosis not present

## 2017-02-02 MED ORDER — TECHNETIUM TC 99M MEDRONATE IV KIT
20.7000 | PACK | Freq: Once | INTRAVENOUS | Status: AC | PRN
  Administered 2017-02-02: 20.7 via INTRAVENOUS
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
## 2017-02-03 ENCOUNTER — Ambulatory Visit (HOSPITAL_BASED_OUTPATIENT_CLINIC_OR_DEPARTMENT_OTHER): Payer: Medicare HMO | Admitting: Hematology

## 2017-02-03 ENCOUNTER — Ambulatory Visit (HOSPITAL_BASED_OUTPATIENT_CLINIC_OR_DEPARTMENT_OTHER): Payer: Medicare HMO

## 2017-02-03 VITALS — BP 122/62 | HR 74 | Temp 98.4°F | Resp 18 | Ht 65.0 in | Wt 193.7 lb

## 2017-02-03 DIAGNOSIS — C50511 Malignant neoplasm of lower-outer quadrant of right female breast: Secondary | ICD-10-CM

## 2017-02-03 DIAGNOSIS — D630 Anemia in neoplastic disease: Secondary | ICD-10-CM | POA: Diagnosis not present

## 2017-02-03 DIAGNOSIS — G893 Neoplasm related pain (acute) (chronic): Secondary | ICD-10-CM

## 2017-02-03 DIAGNOSIS — C7951 Secondary malignant neoplasm of bone: Secondary | ICD-10-CM

## 2017-02-03 DIAGNOSIS — R740 Nonspecific elevation of levels of transaminase and lactic acid dehydrogenase [LDH]: Secondary | ICD-10-CM

## 2017-02-03 DIAGNOSIS — Z170 Estrogen receptor positive status [ER+]: Secondary | ICD-10-CM

## 2017-02-03 DIAGNOSIS — E538 Deficiency of other specified B group vitamins: Secondary | ICD-10-CM | POA: Diagnosis not present

## 2017-02-03 DIAGNOSIS — R110 Nausea: Secondary | ICD-10-CM | POA: Diagnosis not present

## 2017-02-03 DIAGNOSIS — R600 Localized edema: Secondary | ICD-10-CM

## 2017-02-03 DIAGNOSIS — I10 Essential (primary) hypertension: Secondary | ICD-10-CM

## 2017-02-03 DIAGNOSIS — R5383 Other fatigue: Secondary | ICD-10-CM

## 2017-02-03 MED ORDER — CYANOCOBALAMIN 1000 MCG/ML IJ SOLN
1000.0000 ug | Freq: Once | INTRAMUSCULAR | Status: AC
  Administered 2017-02-03: 1000 ug via INTRAMUSCULAR

## 2017-02-03 MED ORDER — CYANOCOBALAMIN 1000 MCG/ML IJ SOLN
INTRAMUSCULAR | Status: AC
  Filled 2017-02-03: qty 1

## 2017-02-03 MED ORDER — DENOSUMAB 120 MG/1.7ML ~~LOC~~ SOLN
120.0000 mg | Freq: Once | SUBCUTANEOUS | Status: AC
  Administered 2017-02-03: 120 mg via SUBCUTANEOUS
  Filled 2017-02-03: qty 1.7

## 2017-02-04 ENCOUNTER — Ambulatory Visit: Payer: Medicare PPO | Admitting: Hematology

## 2017-02-04 ENCOUNTER — Telehealth: Payer: Self-pay | Admitting: *Deleted

## 2017-02-04 NOTE — Telephone Encounter (Signed)
Received call from Lelan Pons in our lab stating that a bilirubin was released yesterday but not drawn.  Discussed with Dr Burr Medico & pt was supposed to go back to lab.  Will have pt come back today or Monday for lab.  Message left for pt to call back to discuss.

## 2017-02-05 ENCOUNTER — Encounter: Payer: Self-pay | Admitting: Hematology

## 2017-02-07 ENCOUNTER — Telehealth: Payer: Self-pay | Admitting: Hematology

## 2017-02-07 ENCOUNTER — Telehealth: Payer: Self-pay | Admitting: *Deleted

## 2017-02-07 ENCOUNTER — Ambulatory Visit: Payer: Medicare PPO

## 2017-02-07 ENCOUNTER — Other Ambulatory Visit: Payer: Self-pay | Admitting: Hematology

## 2017-02-07 DIAGNOSIS — C50511 Malignant neoplasm of lower-outer quadrant of right female breast: Secondary | ICD-10-CM | POA: Diagnosis not present

## 2017-02-07 DIAGNOSIS — R740 Nonspecific elevation of levels of transaminase and lactic acid dehydrogenase [LDH]: Secondary | ICD-10-CM | POA: Diagnosis not present

## 2017-02-07 DIAGNOSIS — C7951 Secondary malignant neoplasm of bone: Secondary | ICD-10-CM | POA: Diagnosis not present

## 2017-02-07 DIAGNOSIS — Z170 Estrogen receptor positive status [ER+]: Secondary | ICD-10-CM | POA: Diagnosis not present

## 2017-02-07 DIAGNOSIS — D649 Anemia, unspecified: Secondary | ICD-10-CM | POA: Diagnosis not present

## 2017-02-07 LAB — CBC & DIFF AND RETIC
BASO%: 0.4 % (ref 0.0–2.0)
BASOS ABS: 0 10*3/uL (ref 0.0–0.1)
EOS ABS: 0 10*3/uL (ref 0.0–0.5)
EOS%: 0.6 % (ref 0.0–7.0)
HCT: 31.7 % — ABNORMAL LOW (ref 34.8–46.6)
HGB: 10.9 g/dL — ABNORMAL LOW (ref 11.6–15.9)
Immature Retic Fract: 21 % — ABNORMAL HIGH (ref 1.60–10.00)
LYMPH#: 0.9 10*3/uL (ref 0.9–3.3)
LYMPH%: 19 % (ref 14.0–49.7)
MCH: 31 pg (ref 25.1–34.0)
MCHC: 34.4 g/dL (ref 31.5–36.0)
MCV: 90.1 fL (ref 79.5–101.0)
MONO#: 0.4 10*3/uL (ref 0.1–0.9)
MONO%: 8.4 % (ref 0.0–14.0)
NEUT%: 71.6 % (ref 38.4–76.8)
NEUTROS ABS: 3.4 10*3/uL (ref 1.5–6.5)
Platelets: 211 10*3/uL (ref 145–400)
RBC: 3.52 10*6/uL — AB (ref 3.70–5.45)
RDW: 13.4 % (ref 11.2–14.5)
Retic %: 2.71 % — ABNORMAL HIGH (ref 0.70–2.10)
Retic Ct Abs: 95.39 10*3/uL — ABNORMAL HIGH (ref 33.70–90.70)
WBC: 4.8 10*3/uL (ref 3.9–10.3)

## 2017-02-07 LAB — COMPREHENSIVE METABOLIC PANEL
ALT: 109 U/L — AB (ref 0–55)
AST: 202 U/L (ref 5–34)
Albumin: 3 g/dL — ABNORMAL LOW (ref 3.5–5.0)
Alkaline Phosphatase: 387 U/L — ABNORMAL HIGH (ref 40–150)
Anion Gap: 10 mEq/L (ref 3–11)
BUN: 7.8 mg/dL (ref 7.0–26.0)
CHLORIDE: 102 meq/L (ref 98–109)
CO2: 22 mEq/L (ref 22–29)
Calcium: 8.7 mg/dL (ref 8.4–10.4)
Creatinine: 0.7 mg/dL (ref 0.6–1.1)
Glucose: 88 mg/dl (ref 70–140)
Potassium: 3.9 mEq/L (ref 3.5–5.1)
SODIUM: 134 meq/L — AB (ref 136–145)
TOTAL PROTEIN: 6.6 g/dL (ref 6.4–8.3)

## 2017-02-07 LAB — LACTATE DEHYDROGENASE: LDH: 337 U/L — ABNORMAL HIGH (ref 125–245)

## 2017-02-07 NOTE — Telephone Encounter (Signed)
Appointment letter and schedule mailed to patient.

## 2017-02-07 NOTE — Telephone Encounter (Signed)
Patient bypassed scheduling on 02/03/17. Called patient to confirm follow up appointment. Left message on voicemail.

## 2017-02-07 NOTE — Telephone Encounter (Signed)
Dr. Burr Medico reviewed CMET today.  Spoke with pt and daughter, and gave both instructions re: 1.    NPO after 2 am 02/08/17. 2.   Go to [facility] radiology at 0930 am  Tues  02/08/17 for  Abdominal US at 10 am 3.   Go to [facility] for appt with Dr. Burr Medico after ultrasound 02/08/17. 4.   Stop   Anastrozole and Ibrance until further instructions.  Both pt and daughter voiced understanding - daughter was able to repeat correct instructions back to nurse.

## 2017-02-08 ENCOUNTER — Telehealth: Payer: Self-pay | Admitting: Hematology

## 2017-02-08 ENCOUNTER — Ambulatory Visit (HOSPITAL_COMMUNITY)
Admission: RE | Admit: 2017-02-08 | Discharge: 2017-02-08 | Disposition: A | Discharge status: Home / Self Care | Payer: Medicare HMO | Source: Ambulatory Visit | Attending: Hematology | Admitting: Hematology

## 2017-02-08 ENCOUNTER — Telehealth: Payer: Self-pay | Admitting: Internal Medicine

## 2017-02-08 ENCOUNTER — Encounter: Payer: Self-pay | Admitting: *Deleted

## 2017-02-08 ENCOUNTER — Ambulatory Visit (HOSPITAL_BASED_OUTPATIENT_CLINIC_OR_DEPARTMENT_OTHER): Payer: Medicare HMO | Admitting: Hematology

## 2017-02-08 VITALS — BP 149/60 | HR 70 | Temp 97.6°F | Resp 18 | Ht 65.0 in | Wt 189.2 lb

## 2017-02-08 DIAGNOSIS — C7951 Secondary malignant neoplasm of bone: Secondary | ICD-10-CM

## 2017-02-08 DIAGNOSIS — K769 Liver disease, unspecified: Secondary | ICD-10-CM | POA: Diagnosis not present

## 2017-02-08 DIAGNOSIS — G893 Neoplasm related pain (acute) (chronic): Secondary | ICD-10-CM | POA: Diagnosis not present

## 2017-02-08 DIAGNOSIS — R740 Nonspecific elevation of levels of transaminase and lactic acid dehydrogenase [LDH]: Secondary | ICD-10-CM

## 2017-02-08 DIAGNOSIS — Z170 Estrogen receptor positive status [ER+]: Secondary | ICD-10-CM

## 2017-02-08 DIAGNOSIS — C50919 Malignant neoplasm of unspecified site of unspecified female breast: Secondary | ICD-10-CM | POA: Diagnosis not present

## 2017-02-08 DIAGNOSIS — R109 Unspecified abdominal pain: Secondary | ICD-10-CM | POA: Diagnosis not present

## 2017-02-08 DIAGNOSIS — D649 Anemia, unspecified: Secondary | ICD-10-CM | POA: Diagnosis not present

## 2017-02-08 DIAGNOSIS — C50511 Malignant neoplasm of lower-outer quadrant of right female breast: Secondary | ICD-10-CM | POA: Diagnosis not present

## 2017-02-08 DIAGNOSIS — J90 Pleural effusion, not elsewhere classified: Secondary | ICD-10-CM | POA: Diagnosis not present

## 2017-02-08 LAB — BILIRUBIN, FRACTIONATED(TOT/DIR/INDIR)
BILIRUBIN INDIRECT: 1.14 mg/dL — AB (ref 0.10–0.80)
Bilirubin Total: 5.4 mg/dL — ABNORMAL HIGH (ref 0.0–1.2)
Bilirubin, Direct: 4.26 mg/dL — ABNORMAL HIGH (ref 0.00–0.40)

## 2017-02-08 LAB — CANCER ANTIGEN 27.29: CA 27.29: 857.9 U/mL — ABNORMAL HIGH (ref 0.0–38.6)

## 2017-02-08 LAB — HEPATITIS B SURFACE ANTIGEN: HEP B S AG: NEGATIVE

## 2017-02-08 LAB — HEPATITIS C ANTIBODY

## 2017-02-08 NOTE — Progress Notes (Signed)
[facility]  Clinical Social Work was referred by patient to review and complete healthcare advance directives.  Clinical Social Worker met with patient and patients daughter in [place] office.  The patient designated Katilynn Sinkler as their primary healthcare agent and Dniya Neuhaus as their secondary agent.  Patient also completed healthcare living will.    Clinical Social Worker notarized documents and made copies for patient/family. Clinical Social Worker will send documents to medical records to be scanned into patient's chart. Clinical Social Worker encouraged patient/family to contact with any additional questions or concerns.  Johnnye Lana, MSW, LCSW, OSW-C Clinical Social Worker [facility] [phone]

## 2017-02-08 NOTE — Telephone Encounter (Signed)
Per Lorriane Shire at [facility] - appt is scheduled for patient already .

## 2017-02-08 NOTE — Progress Notes (Signed)
[place]  [phone]) [phone] [phone]  Clinic follow up Note   Patient Care Team: [name], MD as PCP - General Truitt Merle, MD as Consulting Physician (Hematology) Gardenia Phlegm, NP as Nurse Practitioner (Hematology and Oncology)  02/08/2017  CHIEF COMPLAINTS Follow up breast cancer     Breast cancer of lower-outer quadrant of right female breast ([place])   09/03/2008 Initial Diagnosis    right breast cancer, T2N1Mo, stage IIA, ER+, PR+, HER2+ (ration 2.07). Pt declined surgery or other treatment.        05/20/2011 Tumor Marker    right breast mass biopsy showed IDA, ER+/PR+/HER2- (ratio 1.37)      09/04/2011 Cancer Staging    PET scan showed no distant mets      10/08/2014 Progression     lumbar spine MRI showed multiple bone metastasis , highly suspicious for  metastatic disease.      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 Increased size of right pleural effusion, indeterminate for malignant involvement of the pleural fluids.      01/20/2016 Imaging    CT CAP with contrast showed small bilateral pleural effusion, right greater than left, similar osseous metastasis, no evidence of extraosseous metastasis. Bone scan showed no areas of suspicious uptake.      09/27/2016 Imaging    CT CAP w contrast 1. Heterogeneous perfusion of the liver identified on today's study with some areas of hypoattenuating nodularity. This raises concern for development of liver metastases although this is not a  definite finding today may simply reflect heterogeneous perfusion. MRI of the abdomen without and with contrast would prove helpful to further evaluate, as clinically warranted. 2. Imaging features suggest bony metastatic disease, stable in the interval. Given lack of uptake on bone scan performed earlier today, these bony changes likely reflect treated disease. 3. Persistent marked skin thickening and edema diffusely in the right breast tracking into the region of the right axilla. Not substantially changed in the interval. 4. Interval resolution of small bilateral pleural effusions with posterior right lower lobe collapse/consolidation.      10/10/2016 Imaging    MR Abdomen w/wo contrast 1. Unusual perfusion abnormality involving the liver could be related to prior radiation effect but I do not see any MR findings suspicious for hepatic metastatic disease. The portal and hepatic veins are patent. 2. Stable surgical and radiation changes involving the right breast. No significant change in 21.5 mm irregular enhancing right lateral breast lesion. 3. Diffuse osseous metastatic disease.      10/13/2016 - 10/22/2016 Radiation Therapy    SRS treatment 5 fractions to the left orbit bone metastasis with Dr. Tammi Klippel.      02/01/2017 Imaging    CT CAP  IMPRESSION: Chest Impression: 1. No evidence of soft tissue metastasis in thorax. 2. Bilateral pleural effusions are increased. 3. Focus of round atelectasis RIGHT lung base.  Abdomen / Pelvis Impression: 1. No evidence of metastasis in soft tissue. 2. Nodular liver consistent with cirrhosis. 3. Diffuse sclerotic skeletal  metastasis.      02/02/2017 Imaging    Bone Scan IMPRESSION: 1. Several vague areas of increased activity in the ribs primarily the left anterior 6th or seventh rib but no CT correlate is seen to indicate definite metastatic lesion. Early metastatic lesions cannot be excluded. 2. Increased activity along the right  parietal calvarium could indicate metastatic involvement. 3. A focus of sclerosis within the right iliac bone by yesterday's CT does not demonstrate increased activity on bone scan.        HISTORY OF PRESENTING ILLNESS (10/30/2014):  Margaret Rubio 76 y.o. female  Was referred by her primary care physician to discuss the management probable metastatic breast cancer.  She was initially diagnosed with right breast cancer in November 2009. 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 In December of 2009, she underwent a right axillary lymph node biopsy on 09/24/2008 which showed metastatic ductal carcinoma. (Case No: [id number]) The tumor was ER 100% positive, PR 0% negative, Ki67 - 22%. 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 The ratio of HER-2:CEP 17 was 2.07.  She decided to pursue nature therapy by diet and exercise, and did not have surgery and other tranditional therapy for her breast cancer.   In May 2011 she underwent a mammogram that showed a 3.8 cm invasive duct carcinoma in the subareolar region of the right breast which had increased since November 2009.  The tumor was ER  99% positive,  PR 4% positive, HER-2 negative ( ratio 1.37).  She was seen by Dr. Estella Husk and Dr. Lucia Gaskins.  However, she failed multiple appointments and sought alternative medical care in [place]. This involved putting something on her right breast that caused some scarring.  She noticed the enlarged right breast mass and nipple inversion about one year ago. No pain, but some skin change. She developed left low back/buttock and hip and  pain in Nov 2015, was seen by PCP Dr. Jenny Reichmann who prescribed vicodin and tramadol and pain improved with meds. Due to the persistent pain, she underwent a lumber MRI  On 10/08/2014, which showed multiple bone metastasis.  She run out pain medication about a week ago,  But did not  call for refill. She states her pain is not bad , but she appears to be uncomfortable when she stands up and walks.  She otherwise feels well overall,  Has good appetite and energy level. She lives alone and still functions well.  She comes in with her niece today.   Current therapy:  1.Anastrozole 1 mg once daily started on 12/10/2014 2. Ibrance added on 07/29/15, stopped in Nov 2016, restarted on 01/10/2016, held during her radiation in 09/2016  3. Xgeva started on 08/15/2015, changed to every 3 months after 09/29/2016    INTERIM HISTORY:  Ms. Carmickle returns for follow-up and discuss [place] result. Today she presents to the clinic with her daughter. Her daughter reports her eyes are still yellow and the patient reports her urine is dark. Otherwise she overall feel fine. She denies abdominal discomfort, occasional mild nausea, no vomiting, no change of appetite.      MEDICAL HISTORY:  Past Medical History:  Diagnosis Date  . 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 ASTHMA 01/31/2008  . Breast cancer [facility]) dx 2009   right breast  . BREAST CANCER, HX OF 11/14/2009  . BRONCHITIS, ACUTE 01/31/2008  . Cough 10/17/2009  . FATIGUE 11/14/2009  .  GANGLION CYST 05/01/2009  . HYPERLIPIDEMIA 05/21/2007  . HYPERTENSION 05/05/2007  . LOW BACK PAIN 05/21/2007  . MENOPAUSAL DISORDER 05/22/2010  . Pain in joint, lower leg 05/10/2008  . RASH-NONVESICULAR 05/01/2009  . SHOULDER PAIN, LEFT 11/14/2009  . 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 VITAMIN B12 DEFICIENCY 05/23/2010    SURGICAL HISTORY: Past Surgical History:  Procedure Laterality Date  . OTHER SURGICAL HISTORY     fibroid removal  . removal of neck lump     benigh 1986    Social History   Social History  . Marital status: Widowed    Spouse name: N/A  . Number of children: 1  . Years of education: N/A   Social History Main Topics  . Smoking status: Never Smoker  . Smokeless tobacco: Never Used  . Alcohol use No  . Drug use: No  . Sexual activity: No    Other Topics Concern  . Not on file   Social History Narrative  . No narrative on file    FAMILY HISTORY: Family History  Problem Relation Age of Onset  . Hypertension Father   . Heart disease Father   . Asthma Other   . Diabetes Other   . Hypertension Other   . Cancer Paternal Aunt     leukemia ?  Paternal aunt had cancer, unknown type. No other family history of malignancy   ALLERGIES:  is allergic to tomato.  MEDICATIONS:  Current Outpatient Prescriptions on File Prior to Visit  Medication Sig Dispense Refill  . Acetaminophen 500 MG coapsule     . anastrozole (ARIMIDEX) 1 MG tablet Take 1 tablet (1 mg total) by mouth daily. 30 tablet 5  . Ascorbic Acid (VITAMIN C) 100 MG CHEW Chew 1 tablet (100 mg total) by mouth every morning. 90 each 3  . calcium carbonate 1250 MG capsule Take 1,250 mg by mouth 2 (two) times daily with a meal. Not sure of type or dose    . cholecalciferol (VITAMIN D) 1000 units tablet Take 1,000 Units by mouth daily. Patient not sure of dose    . clotrimazole (GYNE-LOTRIMIN 3) 2 % vaginal cream Apply to affected bilaterall axilla sites as needed for rash. (Patient not taking: Reported on 02/03/2017) 21 g 1  . Cyanocobalamin (B-12 COMPLIANCE INJECTION IJ) Inject as directed every 30 (thirty) days.    . Nystatin POWD 1 apply to right axilla twice daily until rashes resolves 1 Bottle 1  . palbociclib (IBRANCE) 125 MG capsule TAKE 1 CAPSULE BY MOUTH ONCE DAILY WITH BREAKFAST. TAKE WHOLE WITH FOOD FOR 21 DAYS. REPEAT EVERY 28 DAYS 21 capsule 2  . prochlorperazine (COMPAZINE) 5 MG tablet Take 1-2 tablets (5-10 mg total) by mouth every 8 (eight) hours as needed for nausea or vomiting. 30 tablet 0   No current facility-administered medications on file prior to visit.   ; REVIEW OF SYSTEMS:  Constitutional: Denies fevers, chills or abnormal night sweats, (+) ankle and feet swelling, increased appetite, low in protein, (+) low energy Eyes: Denies blurriness of  vision, double vision or watery eyes Ears, nose, mouth, throat, and face: Denies mucositis or sore throat,  Respiratory: Denies cough, dyspnea or wheezes Cardiovascular: Denies palpitation, chest discomfort or lower extremity swelling Gastrointestinal:  Denies nausea, heartburn or change in bowel habits (+) gassy (+) dark urine due to Liver funtion Skin: Denies abnormal skin rashes  Lymphatics: Denies new lymphadenopathy or easy bruising Neurological:Denies numbness, tingling or new weaknesses Behavioral/Psych: Mood is stable, no new changes  All other systems were reviewed with the patient and are negative. Padroni: (+) right upper arm pain  PHYSICAL EXAMINATION:  ECOG PERFORMANCE STATUS: 1 BP (!) 149/60 (BP Location: Left Arm, Patient Position: Sitting)   Pulse 70   Temp 97.6 F (36.4 C) (Oral)   Resp 18   Ht '5\' 5"'$  (1.651 m)   Wt 189 lb 3.2 oz (85.8 kg)   SpO2 100%   BMI 31.48 kg/m   GENERAL:alert, no distress and comfortable SKIN: texture, turgor are normal, no significant lesions, (+) skin color jundice EYES: normal, conjunctiva are pink and non-injected,(+) has juandice in eyes  OROPHARYNX:no exudate, no erythema and lips, buccal mucosa, and tongue normal  NECK: supple, thyroid normal size, non-tender, without nodularity LYMPH:  no palpable lymphadenopathy in the cervical, axillary or inguinal LUNGS: clear to auscultation and percussion with normal breathing effort, slightly decreased breath sounds at the bottom of right lung. HEART: regular rate & rhythm and no murmurs and no lower extremity edema ABDOMEN:abdomen soft, non-tender and normal bowel sounds (+) pain Musculoskeletal:no cyanosis of digits and no clubbing, (+) mild edema on both legs and both feet, up to knee, L>R PSYCH: alert & oriented x 3 with fluent speech NEURO: no focal motor/sensory deficits. Her left eye ball has limited range of movement  Breasts: Breast inspection showed them to be symmetrical with no nipple  discharge. Palpation of the breasts and axilla revealed no obvious mass that I could appreciate.Marland Kitchen    LABORATORY DATA:  I have reviewed the data as listed CBC Latest Ref Rng & Units 02/07/2017 01/03/2017 12/08/2016  WBC 3.9 - 10.3 10e3/uL 4.8 3.5(L) 2.6(L)  Hemoglobin 11.6 - 15.9 g/dL 10.9(L) 11.2(L) 10.1(L)  Hematocrit 34.8 - 46.6 % 31.7(L) 33.1(L) 30.4(L)  Platelets 145 - 400 10e3/uL 211 187 200    CMP Latest Ref Rng & Units 02/07/2017 02/07/2017 01/03/2017  Glucose 70 - 140 mg/dl 88 - 88  BUN 7.0 - 26.0 mg/dL 7.8 - 8.5  Creatinine 0.6 - 1.1 mg/dL 0.7 - 0.8  Sodium 136 - 145 mEq/L 134(L) - 135(L)  Potassium 3.5 - 5.1 mEq/L 3.9 - 4.0  Chloride 96 - 112 mEq/L - - -  CO2 22 - 29 mEq/L 22 - 25  Calcium 8.4 - 10.4 mg/dL 8.7 - 9.1  Total Protein 6.4 - 8.3 g/dL 6.6 - 6.9  Total Bilirubin 0.0 - 1.2 mg/dL 5.4(H) 5.93 Repeated and Verified(HH) 1.79(H)  Alkaline Phos 40 - 150 U/L 387(H) - 193(H)  AST 5 - 34 U/L 202 Repeated and Verified(HH) - 120(H)  ALT 0 - 55 U/L 109(H) - 76(H)   CA27.29: 06/30/2012: 28 10/30/2014: 355 08/15/2015: 424 12/27/2014: 414 05/05/2016: 416.1 06/10/2016: 332.4 07/01/2016: 348.8 08/03/2016: 388.1 09/01/2016: 321.1 10/26/16: 404.3 11/30/16: 403.7 01/03/17: 815.9 02/07/17: 857.9   PATHOLOGY REPORT  RIGHT BREAST MASS, 6 O'CLOCK, NEEDLE CORE BIOPSIES: 09/03/2008 MICROSCOPIC FOCUS OF DUCTAL CARCINOMA IN SITU ASSOCIATED WITH MICROSCOPIC FOCUS OF MICROINVASION. SEE COMMET.  LYMPH NODE, RIGHT AXILLARY, NEEDLE CORE BIOPSIES: METASTATIC DUCTAL CARCINOMA.  09/24/2008  Diagnosis 05/20/2011 Breast, right, needle core biopsy - INVASIVE DUCTAL CARCINOMA, SEE COMMENT. - LYMPHOVASCULAR INVASION PRESENT. Estrogen Receptor (Negative, <1%): 99%, STRONG STAINING INTENSITY Progesterone Receptor (Negative, <1%): 4%, STRONG HER-2/NEU BY CISH - NO AMPLIFICATION OF HER-2 DETECTED. THE RATIO OF HER-2: CEP 17 SIGNALS WAS 1.38.  Breast, right, needle core biopsy, 6:30 o'clock  12/13/2014 - INVASIVE MAMMARY CARCINOMA. - SEE MICROSCOPIC DESCRIPTION.  Diagnosis 08/01/2015 PLEURAL FLUID, RIGHT(SPECIMEN 1 OF 1 COLLECTED 08/01/15): METASTATIC CARCINOMA CONSISTENT  WITH BREAST PRIMARY.   RADIOGRAPHIC STUDIES: I have personally reviewed the radiological images as listed and agreed with the findings in the report.  PET 07/17/2015 IMPRESSION: 1. Suspected active diverticulitis along the sigmoid colon, with focal mesenteric stranding and a small locule of gas which is either within an inflamed diverticulum or a micro perforation. No overt free air. 2. Overall similar appearance of multifocal right breast cancer with extensive osseous metastatic disease. The right axillary lymph nodes are less notable that there is a borderline hypermetabolic left axillary lymph node currently. 3. Increasing size of the right pleural effusion, now moderate. Appearance indeterminate for malignant involvement of the pleural fluid. 4. Faintly increased activity in a right infrahilar lymph node which could represent early metastatic spread. 5. Other imaging findings of potential clinical significance: Chronic left maxillary sinusitis. Low-density blood pool suggests anemia. Left adrenal adenoma. These results will be called to the ordering clinician or representative by the Radiologist Assistant, and communication documented in the PACS or zVision Dashboard.  Brain MRI with and without contrast on 06/27/2015, at [facility] -In determinate, ill-defined soft tissue infiltration in the left orbit with questionable enhancement, further evaluation with CT orbits with contrast is recommended to the cyst in ruling out metastasis. -No intracranial metastasis -Diffusely hypoechoic intense bone marrow signal throughout the skull base   Bone scan 05/31/2016 IMPRESSION: New increased up take in the ribs bilaterally worrisome for metastatic disease. Stable  increased uptake in the right fronto poor parietal bone.  A bilateral rib series is recommended.   CT chest, abdomen and pelvis with contrast 05/31/2016 IMPRESSION: 1. Similar fit appearance of widespread it treated osseous metastatic disease, no progression. No findings of new extra osseous metastatic disease. 2. Continued skin thickening and edema in the right breast. The edema in the right breast and tracking in the right chest wall is slightly increased compared to prior and the lateral soft tissue density in the right lateral breast is stable. 3. Similar small bilateral pleural effusions with some atelectasis at the right lung base possibly tethered to the parietal pleura. 4. Mild coronary artery atherosclerosis and atherosclerotic calcification of the aortic arch. 5. Airway thickening is present, suggesting bronchitis or reactive airways disease.  MR orbits w/wo contrast 07/22/2016  IMPRESSION: 1. Diffuse metastasis within the left orbit intraconal space, which likely invades the left extraocular muscles. No abnormal enhancement of the left optic nerve to strongly indicate nerve invasion although there does appear to be abnormal T2 signal within the substance of the L CN2, such as due to neuritis. The left globe appears spared. 2. No other soft tissue metastasis identified, an the right orbit appears normal. Evidence of osseous metastatic disease in the cervical spine and possibly also the calvarium.  CT CAP W Contrast 09/28/16 IMPRESSION: 1. Heterogeneous perfusion of the liver identified on today's study with some areas of hypoattenuating nodularity. This raises concern for development of liver metastases although this is not a definite finding today may simply reflect heterogeneous perfusion. MRI of the abdomen without and with contrast would prove helpful to further evaluate, as clinically warranted. 2. Imaging features suggest bony metastatic disease, stable in  the interval. Given lack of uptake on bone scan performed earlier today, these bony changes likely reflect treated disease. 3. Persistent marked skin thickening and edema diffusely in the right breast tracking into the region of the right axilla. Not substantially changed in the interval. 4. Interval resolution of small bilateral pleural effusions with  posterior right lower lobe collapse/consolidation.   MR Abdomen w/wo contrast 10/10/2016 IMPRESSION: 1. Unusual perfusion abnormality involving the liver could be related to prior radiation effect but I do not see any MR findings suspicious for hepatic metastatic disease. The portal and hepatic veins are patent. 2. Stable surgical and radiation changes involving the right breast. No significant change in 21.5 mm irregular enhancing right lateral breast lesion. 3. Diffuse osseous metastatic disease.  CT CAP 02/01/17 IMPRESSION: Chest Impression: 1. No evidence of soft tissue metastasis in thorax. 2. Bilateral pleural effusions are increased. 3. Focus of round atelectasis RIGHT lung base.  Abdomen / Pelvis Impression: 1. No evidence of metastasis in soft tissue. 2. Nodular liver consistent with cirrhosis. 3. Diffuse sclerotic skeletal metastasis.  Bone Scan 02/02/17 IMPRESSION: 1. Several vague areas of increased activity in the ribs primarily the left anterior 6th or seventh rib but no CT correlate is seen to indicate definite metastatic lesion. Early metastatic lesions cannot be excluded. 2. Increased activity along the right parietal calvarium could indicate metastatic involvement. 3. A focus of sclerosis within the right iliac bone by yesterday's CT does not demonstrate increased activity on bone scan.   ASSESSMENT & PLAN:   Margaret Rubio is a 76 y.o. African-American female, who was diagnosed with node positive right breast  Invasive adenocarcinoma in  2009, untreated,  Now presents with low back pain and lumbar spine MRI  is highly suspicious for bone metastasis.  1. Worsening transaminitis and hyperbilirubinemia -She started having mild elevated liver enzymes about 4-5 months before, much worse now, with tbili around 6, main direct bili  -CT scan on 09/27/16 showed heterogeneous perfusion in the liver. -Liver MRI on 10/10/16 showed unusual perfusion abnormality involving the liver, but no definite evidence of metastasis. -repeated staging CT scan on 02/01/2017 did not show evidence of liver metastasis, but nodular liver consistent with liver cirrhosis, she has no history of liver disease in the past, hepatitis B and C were negative, she does not drink alcohol much. -Ultrasound abdomen this morning showed nodular liver suspicious for liver cirrhosis. No bili obstruction.  -Her imaging findings are consistent with liver cirrhosis, however the etiology is unclear. I'll make urgent referral to [facility] for work up  -I will hold anastrozole and Ibrance for 3-4 weeks to see if her liver function improves -Given her underlying metastatic breast cancer, infiltrative liver metastasis still a possibility. If her GI workup and negative, I'll recommend a liver biopsy  2. 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 -she knows that her disease at this stage is incurable. The goal of treatment is palliative and to prolong her life. -she is on first line anastrozole, Leslee Home was added on in October 2016 due to slight disease progression. She was not very compliant, off Ibrance for 3-4 months and restarted in 01/2016. -I previously discussed her  restaging CT and bone scan from 05/31/2016, which showed diffuse similar appearing bone lesions, no other new metastasis. Bone scan showed mild bilateral rib uptake, but no corresponding findings on the CT. I think she has stable disease overall. -I previously reviewed  her restaging CT scan and bone scan from 09/27/2016, which showed stable bone mets. CT scan showed heterogeneous perfusion of the liver indeterminate -I previously reviewed MR abdomen scan from 10/10/2016 with the patient, which showed no evidence of metastatic disease in liver  -she had developed some nausea and fatigue previously, lab reviewed, she developed transaminitis and hyperbilirubinemia. - Restaging CT scan on 02/01/17 showed no evidence of soft tissue metastasis and diffuse sclerotic skeletal metastasis. No liver metastasis. Her bone scan 02/02/2017 however showed several vague area of increased activity is in the ribs without CT correlate. The bone scan is not certain if she has bone metastasis progression. I'll obtain a PET scan in the next few weeks for further evaluation of her bone mets and rule out liver metastasis  - Will hold Anastrozole and Ibrance for 3 weeks to see if liver function improves   3. Left orbit bone metastasis  -this was found on her brain MRI in 06/2015 when she had heaache, she has issues with left eye ball movement, was evaluated by Dr. Emilio Math at [facility] -this is likely a metastatic lesion from her breast cancer -I previously discussed her orbital MRI scan findings, which showed diffuse metastases to status is within the left orbital with invading the left extraocular muscles. No optical nerve involvement. -She previously completed treatment with radiation oncologist Dr. Tammi Klippel, who delivered 5 fractions of SRS to her left orbit bone metastasis   4. Low back/ hip pain,  Secondary to metastatic cancer to bones  -this has resolved since she started anastrozole -continue Xgeva every 3 months, will  monitor  5. Anemia -Likely secondary to her breast cancer, bone metastases and B12 deficiency -Slightly improved after B12 injections. Continue B12 monthly for now. -No need for transfusion. We'll follow up closely. -The patient was previously neutropenic because she took 2 extra doses of Ibrance.   6. Goal of care discussion  -We previously discussed the incurable nature of her cancer, and the overall poor prognosis, especially if he does not have good response to treatment or progress on treatment -The patient understands the goal of care is palliative. -I previously  recommend DNR/DNI, she will think about it   7. Nausea and fatigue -she will use compazine as needed for nausea  -I'll also encourage her to try Maalox for her gastric discomfort  8. LE edema -She has previously developed left foot pain and swollen, on exam she has bilateral lower extremity edema, mild, more prominent in the left -Her previously  x-ray was negative -I previously obtain a bilateral lower extremity Doppler to ruled out DVT which was negative on 01/07/2017 -Feet swelling as well as of 02/03/17  Plan:  -will hold anastrozole and Ibrance for 3 weeks to see if her liver function improves  -Urgent [facility] referral, to be seen this week, I sent a message to Dr. Ardis Hughs  -PET scan in 1-2 weeks  -f/u in 3 week, will consider liver biopsy if work up negative    All questions were answered. The patient knows to call the clinic with any problems, questions or concerns.  I spent 25 minutes counseling the patient face to face. The total time spent in the appointment was 30 minutes and more than 50% was on counseling.  This document serves as a record of services personally performed by Truitt Merle, MD. It was created on her behalf by Joslyn Devon, a trained medical scribe. The creation of this record is based on the scribe's personal observations  and the provider's statements to them. This document has been checked  and approved by the attending provider.     Truitt Merle, MD 02/08/2017

## 2017-02-08 NOTE — Telephone Encounter (Signed)
Gave patient AVS and calender per 5/1 los.  

## 2017-02-09 ENCOUNTER — Telehealth: Payer: Self-pay

## 2017-02-09 ENCOUNTER — Encounter: Payer: Self-pay | Admitting: Hematology

## 2017-02-09 NOTE — Telephone Encounter (Signed)
-----   Message from Truitt Merle, MD sent at 02/09/2017  7:28 AM EDT ----- Thanks much!  Krista Blue  ----- Message ----- From: Milus Banister, MD Sent: 02/09/2017   7:05 AM To: Jeoffrey Massed, RN, Truitt Merle, MD, #  I don't have time this week but we have openings with extenders this week and early next.  Deshanta Lady, She needs appt with either Amy or Jess as new patient (looks like 3-4 openings in next several days).  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 She now has developed significant transaminitis and hyperbilirubinemia, with total bilirubin around 6 in the past week, CT scan and ultrasound showed no evidence of liver metastasis or biliary obstruction, but showed possible liver cirrhosis. Her hepatitis b and C were negative. She is not alcohol drinker. She is asymptomatic.  Could either of you see him this week?   Thanks much for your help!  Krista Blue

## 2017-02-09 NOTE — Telephone Encounter (Signed)
The pt has an appt on 02/11/17 with Amy [place].

## 2017-02-11 ENCOUNTER — Encounter: Payer: Self-pay | Admitting: Physician Assistant

## 2017-02-11 ENCOUNTER — Other Ambulatory Visit (INDEPENDENT_AMBULATORY_CARE_PROVIDER_SITE_OTHER): Payer: Medicare HMO

## 2017-02-11 ENCOUNTER — Ambulatory Visit (INDEPENDENT_AMBULATORY_CARE_PROVIDER_SITE_OTHER): Payer: Medicare HMO | Admitting: Physician Assistant

## 2017-02-11 VITALS — BP 110/68 | HR 76 | Ht 65.0 in | Wt 188.4 lb

## 2017-02-11 DIAGNOSIS — R17 Unspecified jaundice: Secondary | ICD-10-CM

## 2017-02-11 DIAGNOSIS — C50919 Malignant neoplasm of unspecified site of unspecified female breast: Secondary | ICD-10-CM | POA: Diagnosis not present

## 2017-02-11 DIAGNOSIS — R7989 Other specified abnormal findings of blood chemistry: Secondary | ICD-10-CM | POA: Diagnosis not present

## 2017-02-11 DIAGNOSIS — R945 Abnormal results of liver function studies: Secondary | ICD-10-CM

## 2017-02-11 LAB — COMPREHENSIVE METABOLIC PANEL
ALK PHOS: 319 U/L — AB (ref 39–117)
ALT: 93 U/L — ABNORMAL HIGH (ref 0–35)
AST: 171 U/L — ABNORMAL HIGH (ref 0–37)
Albumin: 3.5 g/dL (ref 3.5–5.2)
BUN: 10 mg/dL (ref 6–23)
CO2: 25 meq/L (ref 19–32)
Calcium: 8.6 mg/dL (ref 8.4–10.5)
Chloride: 101 mEq/L (ref 96–112)
Creatinine, Ser: 0.7 mg/dL (ref 0.40–1.20)
GFR: 104.38 mL/min (ref >60.00)
GLUCOSE: 90 mg/dL (ref 70–99)
POTASSIUM: 3.9 meq/L (ref 3.5–5.1)
Sodium: 134 mEq/L — ABNORMAL LOW (ref 135–145)
Total Bilirubin: 6.9 mg/dL — ABNORMAL HIGH (ref 0.2–1.2)
Total Protein: 6.7 g/dL (ref 6.0–8.3)

## 2017-02-11 LAB — CBC WITH DIFFERENTIAL/PLATELET
BASOS PCT: 0.6 % (ref 0.0–3.0)
Basophils Absolute: 0 10*3/uL (ref 0.0–0.1)
EOS PCT: 0.4 % (ref 0.0–5.0)
Eosinophils Absolute: 0 10*3/uL (ref 0.0–0.7)
HCT: 32.9 % — ABNORMAL LOW (ref 36.0–46.0)
Hemoglobin: 11.2 g/dL — ABNORMAL LOW (ref 12.0–15.0)
LYMPHS ABS: 1.4 10*3/uL (ref 0.7–4.0)
Lymphocytes Relative: 24.7 % (ref 12.0–46.0)
MCHC: 34 g/dL (ref 30.0–36.0)
MCV: 94.8 fl (ref 78.0–100.0)
MONOS PCT: 12.1 % — AB (ref 3.0–12.0)
Monocytes Absolute: 0.7 10*3/uL (ref 0.1–1.0)
NEUTROS ABS: 3.6 10*3/uL (ref 1.4–7.7)
NEUTROS PCT: 62.2 % (ref 43.0–77.0)
PLATELETS: 220 10*3/uL (ref 150.0–400.0)
RBC: 3.47 Mil/uL — ABNORMAL LOW (ref 3.87–5.11)
RDW: 13.9 % (ref 11.5–15.5)
WBC: 5.7 10*3/uL (ref 4.0–10.5)

## 2017-02-11 LAB — PROTIME-INR
INR: 1.2 ratio — ABNORMAL HIGH (ref 0.8–1.0)
PROTHROMBIN TIME: 12.9 s (ref 9.6–13.1)

## 2017-02-11 MED ORDER — ONDANSETRON HCL 4 MG PO TABS: 4.0000 mg | ORAL_TABLET | Freq: Four times a day (QID) | ORAL | 2 refills | Status: AC

## 2017-02-11 MED FILL — ONDANSETRON HCL 4 MG TABLET: 4 | 15 days supply | Qty: 50 | Fill #0

## 2017-02-11 NOTE — Progress Notes (Signed)
Subjective:    Patient ID: Margaret Rubio, female    DOB: May 19, 1941, 76 y.o.   MRN: [id number]  HPI  Margaret Rubio is a very nice 76 year old African-American female, new to GI today referred by Dr. Mosetta Rubio for evaluation of elevated LFTs, hyperbilirubinemia with most recent bili 5.9 and abnormal hepatic imaging. Patient has history of metastatic breast cancer, initially diagnosed in 2009. She has an invasive ductal cancer, for which she declined surgery for further treatment at the time of diagnosis. It sounds as if she pursued a naturopathic approach. She  presented in 2015 and was found to have multiple bone metastases. She has been on treatment with Anastrozole since March 2016 and  Margaret Rubio was added in 2016 as well. She has since developed a right pleural effusion, presumed to be malignant. 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 Restaging CT April 2018 bilateral pleural effusions increased, nodular liver concerning for cirrhosis, diffuse sclerotic skeletal metastases Labs done in April 2018 showed total bili of 1.79 alkaline phosphatase 193, AST 120, ALT of 76, LDH was 337 and CA 27.2 06/18/1957. 9 LFTs repeated for 32,018 total bili 5.9, alkaline phosphatase 387, AST 202, ALT 109. Upper abdominal ultrasound 02/08/2017 nodular liver cannot rule out cirrhosis no ductal dilation noted.  Patient says she has not been feeling well over the past month or so, she had been walking previously quite a bit and says she just hasn't felt well. She has developed some nausea and said she had a lot of pain after she drank the CT  contrast. She has not had any vomiting but appetite has been decreased. She does admit to having some upper abdominal discomfort after eating recently. Her daughters were questioning whether the CT scan contrast could have done something to her liver. Chemotherapy has been stopped. Dr. Mosetta Rubio  has discussed palliative management with the patient. Patient denies any fevers, she's not had any pruritus but has noticed that her urine ha s been dark over the past week or so.   Review of Systems Pertinent positive and negative review of systems were noted in the above HPI section.  All other review of systems was otherwise negative.  Outpatient Encounter Prescriptions as of 02/11/2017  Medication Sig  . Ascorbic Acid (VITAMIN C) 100 MG CHEW Chew 1 tablet (100 mg total) by mouth every morning.  . calcium carbonate 1250 MG capsule Take 1,250 mg by mouth 2 (two) times daily with a meal. Not sure of type or dose  . cholecalciferol (VITAMIN D) 1000 units tablet Take 1,000 Units by mouth daily. Patient not sure of dose  . clotrimazole (GYNE-LOTRIMIN 3) 2 % vaginal cream Apply to affected bilaterall axilla sites as needed for rash.  . Cyanocobalamin (B-12 COMPLIANCE INJECTION IJ) Inject as directed every 30 (thirty) days.  . Nystatin POWD 1 apply to right axilla twice daily until rashes resolves  . prochlorperazine (COMPAZINE) 5 MG tablet Take 1-2 tablets (5-10 mg total) by mouth every 8 (eight) hours as needed for nausea or vomiting.  . ondansetron (ZOFRAN) 4 MG tablet Take 1 tablet (4 mg total) by mouth every 6 (six) hours.  . [  DISCONTINUED] Acetaminophen 500 MG coapsule   . [DISCONTINUED] anastrozole (ARIMIDEX) 1 MG tablet Take 1 tablet (1 mg total) by mouth daily. (Patient not taking: Reported on 02/08/2017)  . [DISCONTINUED] palbociclib (IBRANCE) 125 MG capsule TAKE 1 CAPSULE BY MOUTH ONCE DAILY WITH BREAKFAST. TAKE WHOLE WITH FOOD FOR 21 DAYS. REPEAT EVERY 28 DAYS (Patient not taking: Reported on 02/08/2017)    No facility-administered encounter medications on file as of 02/11/2017.    Allergies  Allergen Reactions  . Asa [Aspirin]     Patient does not like side effects  . Tomato Hives   Patient Active Problem List   Diagnosis Date Noted  . Goals of care, counseling/discussion 10/26/2016  . 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 Left eye pain 05/19/2016  . Left-sided face pain 05/19/2016  . Peripheral edema 03/29/2016  . Acute URI 01/15/2016  . 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 Pleural effusion, right 11/04/2015  . Breast cancer metastasized to bone (HCC) 11/04/2015  . Encounter for chemotherapy management 07/31/2015  . Broken tooth 05/27/2015  . Left lumbar radiculopathy 10/08/2014  . Vitamin D deficiency 08/21/2014  . Unspecified vitamin D deficiency 09/26/2013  . Primary localized osteoarthrosis, lower leg 09/26/2013  . Right knee pain 12/19/2012  . Preventative health care 05/18/2011  . Vitamin B12 deficiency 05/23/2010  . MENOPAUSAL DISORDER 05/22/2010  . SHOULDER PAIN, LEFT 11/14/2009  . FATIGUE 11/14/2009  . Breast cancer of lower-outer quadrant of right female breast (HCC) 11/14/2009  . Cough 10/17/2009  . GANGLION CYST 05/01/2009  . Rash 05/01/2009  . Pain in joint, lower leg 05/10/2008  . ASTHMA 01/31/2008  . HYPERLIPIDEMIA 05/21/2007  . LOW BACK PAIN 05/21/2007  . Iron deficiency anemia 05/05/2007  . Essential hypertension 05/05/2007   Social History   Social History  . Marital status: Widowed    Spouse name: N/A  . Number of children: 1  . Years of education: N/A   Occupational History  . Not on file.   Social History Main Topics  . Smoking status: Never Smoker  . Smokeless tobacco: Never Used  . Alcohol use No  . Drug use: No  . Sexual activity: No   Other Topics Concern  . Not on file   Social History Narrative  . No narrative on file    Margaret Rubio's family history includes Asthma in her other; Cancer in her paternal aunt;  Diabetes in her other; Heart disease in her father; Hypertension in her father and other.      Objective:    Vitals:   02/11/17 1510  BP: 110/68  Pulse: 76    Physical Exam  well-developed elderly African-American female in no acute distress, accompanied by 2 daughters. Very pleasant blood pressure 110/68 pulse 76, height 5 foot 5, weight 188, BMI of 31.3. HEENT; nontraumatic normocephalic EOMI PERRLA sclera are anicteric, Cardiovascular; regular rate and rhythm with S1-S2 no murmur or gallop, Pulmonary ;clear bilaterally somewhat decreased breath sounds right base, Abdomen ;soft, nondistended no appreciable fluid wave no palpable mass or hepatosplenomegaly, bowel sounds are present, Rectal; exam not done, Extremities; no clubbing cyanosis or edema skin warm and dry, Neuropsych; mood and affect appropriate       Assessment & Plan:   #66 76 year old female with metastatic breast cancer with diffuse osseous metastatic disease, bilateral right pleural effusions, and new elevated LFTs, hyperbilirubinemia with patient complaining of fatigue and intermittent nausea. Imaging has shown nodular unusual appearance of the liver, possible cirrhosis. She had normal imaging of  her liver in August 2017. I am concerned that she may have metastatic involvement of her liver rather than cirrhosis. She has not had any radiation to her abdomen. #2 bilateral lower extremity edema  Plan; CBC with differential, CMET, pro time/INR Schedule for MRI of the liver and MRCP Low-sodium diet Further plans and management will be discussed after her MRI is  reviewed. Patient will be established with Dr. Christella Hartigan..  I discussed the possibilities of cirrhosis and/or progression of her breast cancer with metastatic disease to the liver with patient and her daughters today.  Nili Honda S Tera Pellicane PA-C 02/11/2017   Cc: Corwin Levins, MD

## 2017-02-11 NOTE — Patient Instructions (Addendum)
You have been scheduled for an MRI at  [facility]  on Tuesday May 8th  Your appointment time is 3:00pm. Please arrive 15 minutes prior to your appointment time for registration purposes. Please make certain not to have anything to eat or drink 6 hours prior to your test. In addition, if you have any metal in your body, have a pacemaker or defibrillator, please be sure to let your ordering physician know. This test typically takes 45 minutes to 1 hour to complete. Should you need to reschedule, please call [phone] to do so.   Go to the basement for labs today  We have sent Zofran to your pharmacy   Stop other nausea meds when you start Zofran    Low-Sodium Eating Plan Sodium, which is an element that makes up salt, helps you maintain a healthy balance of fluids in your body. Too much sodium can increase your blood pressure and cause fluid and waste to be held in your body. Your health care provider or dietitian may recommend following this plan if you have high blood pressure (hypertension), kidney disease, liver disease, or heart failure. Eating less sodium can help lower your blood pressure, reduce swelling, and protect your heart, liver, and kidneys. What are tips for following this plan? General guidelines   Most people on this plan should limit their sodium intake to 1,500-2,000 mg (milligrams) of sodium each day. Reading food labels   The Nutrition Facts label lists the amount of sodium in one serving of the food. If you eat more than one serving, you must multiply the listed amount of sodium by the number of servings.  Choose foods with less than 140 mg of sodium per serving.  Avoid foods with 300 mg of sodium or more per serving. Shopping   Look for lower-sodium products, often labeled as "low-sodium" or "no salt added."  Always check the sodium content even if foods are labeled as "unsalted" or "no salt added".  Buy fresh foods.  Avoid canned foods and premade  or frozen meals.  Avoid canned, cured, or processed meats  Buy breads that have less than 80 mg of sodium per slice. Cooking   Eat more home-cooked food and less restaurant, buffet, and fast food.  Avoid adding salt when cooking. Use salt-free seasonings or herbs instead of table salt or sea salt. Check with your health care provider or pharmacist before using salt substitutes.  Cook with plant-based oils, such as canola, sunflower, or olive oil. Meal planning   When eating at a restaurant, ask that your food be prepared with less salt or no salt, if possible.  Avoid foods that contain MSG (monosodium glutamate). MSG is sometimes added to [place] food, bouillon, and some canned foods. What foods are recommended? The items listed may not be a complete list. Talk with your dietitian about what dietary choices are best for you. Grains  Low-sodium cereals, including oats, puffed wheat and rice, and shredded wheat. Low-sodium crackers. Unsalted rice. Unsalted pasta. Low-sodium bread. Whole-grain breads and whole-grain pasta. Vegetables  Fresh or frozen vegetables. "No salt added" canned vegetables. "No salt added" tomato sauce and paste. Low-sodium or reduced-sodium tomato and vegetable juice. Fruits  Fresh, frozen, or canned fruit. Fruit juice. Meats and other protein foods  Fresh or frozen (no salt added) meat, poultry, seafood, and fish. Low-sodium canned tuna and salmon. Unsalted nuts. Dried peas, beans, and lentils without added salt. Unsalted canned beans. Eggs. Unsalted nut butters. Dairy  Milk. Soy milk.  Cheese that is naturally low in sodium, such as ricotta cheese, fresh mozzarella, or Swiss cheese Low-sodium or reduced-sodium cheese. Cream cheese. Yogurt. Fats and oils  Unsalted butter. Unsalted margarine with no trans fat. Vegetable oils such as canola or olive oils. Seasonings and other foods  Fresh and dried herbs and spices. Salt-free seasonings. Low-sodium mustard and  ketchup. Sodium-free salad dressing. Sodium-free light mayonnaise. Fresh or refrigerated horseradish. Lemon juice. Vinegar. Homemade, reduced-sodium, or low-sodium soups. Unsalted popcorn and pretzels. Low-salt or salt-free chips. What foods are not recommended? The items listed may not be a complete list. Talk with your dietitian about what dietary choices are best for you. Grains  Instant hot cereals. Bread stuffing, pancake, and biscuit mixes. Croutons. Seasoned rice or pasta mixes. Noodle soup cups. Boxed or frozen macaroni and cheese. Regular salted crackers. Self-rising flour. Vegetables  Sauerkraut, pickled vegetables, and relishes. Olives. [place] fries. Onion rings. Regular canned vegetables (not low-sodium or reduced-sodium). Regular canned tomato sauce and paste (not low-sodium or reduced-sodium). Regular tomato and vegetable juice (not low-sodium or reduced-sodium). Frozen vegetables in sauces. Meats and other protein foods  Meat or fish that is salted, canned, smoked, spiced, or pickled. Bacon, ham, sausage, hotdogs, corned beef, chipped beef, packaged lunch meats, salt pork, jerky, pickled herring, anchovies, regular canned tuna, sardines, salted nuts. Dairy  Processed cheese and cheese spreads. Cheese curds. Blue cheese. Feta cheese. String cheese. Regular cottage cheese. Buttermilk. Canned milk. Fats and oils  Salted butter. Regular margarine. Ghee. Bacon fat. Seasonings and other foods  Onion salt, garlic salt, seasoned salt, table salt, and sea salt. Canned and packaged gravies. Worcestershire sauce. Tartar sauce. Barbecue sauce. Teriyaki sauce. Soy sauce, including reduced-sodium. Steak sauce. Fish sauce. Oyster sauce. Cocktail sauce. Horseradish that you find on the shelf. Regular ketchup and mustard. Meat flavorings and tenderizers. Bouillon cubes. Hot sauce and Tabasco sauce. Premade or packaged marinades. Premade or packaged taco seasonings. Relishes. Regular salad dressings.  Salsa. Potato and tortilla chips. Corn chips and puffs. Salted popcorn and pretzels. Canned or dried soups. Pizza. Frozen entrees and pot pies. Summary  Eating less sodium can help lower your blood pressure, reduce swelling, and protect your heart, liver, and kidneys.  Most people on this plan should limit their sodium intake to 1,500-2,000 mg (milligrams) of sodium each day.  Canned, boxed, and frozen foods are high in sodium. Restaurant foods, fast foods, and pizza are also very high in sodium. You also get sodium by adding salt to food.  Try to cook at home, eat more fresh fruits and vegetables, and eat less fast food, canned, processed, or prepared foods. This information is not intended to replace advice given to you by your health care provider. Make sure you discuss any questions you have with your health care provider. Document Released: 03/19/2002 Document Revised: 09/20/2016 Document Reviewed: 09/20/2016 Elsevier Interactive Patient Education  2017 [employer].

## 2017-02-14 NOTE — Progress Notes (Signed)
If not clear etiology by imaging or serologies, will have to consider liver biopsy to check for infiltrating cancer.

## 2017-02-15 ENCOUNTER — Ambulatory Visit (HOSPITAL_COMMUNITY): Admission: RE | Admit: 2017-02-15 | Payer: Medicare HMO | Source: Ambulatory Visit

## 2017-02-18 ENCOUNTER — Other Ambulatory Visit: Payer: Self-pay | Admitting: Physician Assistant

## 2017-02-18 ENCOUNTER — Ambulatory Visit (HOSPITAL_COMMUNITY): Payer: Medicare HMO

## 2017-02-18 ENCOUNTER — Encounter (HOSPITAL_COMMUNITY)
Admission: RE | Admit: 2017-02-18 | Discharge: 2017-02-18 | Disposition: A | Discharge status: Home / Self Care | Payer: Medicare HMO | Source: Ambulatory Visit | Attending: Physician Assistant | Admitting: Physician Assistant

## 2017-02-18 DIAGNOSIS — R945 Abnormal results of liver function studies: Secondary | ICD-10-CM

## 2017-02-18 DIAGNOSIS — R7989 Other specified abnormal findings of blood chemistry: Secondary | ICD-10-CM | POA: Insufficient documentation

## 2017-02-18 DIAGNOSIS — C7981 Secondary malignant neoplasm of breast: Secondary | ICD-10-CM | POA: Diagnosis not present

## 2017-02-18 DIAGNOSIS — C7951 Secondary malignant neoplasm of bone: Secondary | ICD-10-CM | POA: Diagnosis not present

## 2017-02-18 DIAGNOSIS — R17 Unspecified jaundice: Secondary | ICD-10-CM

## 2017-02-18 DIAGNOSIS — R935 Abnormal findings on diagnostic imaging of other abdominal regions, including retroperitoneum: Secondary | ICD-10-CM | POA: Diagnosis not present

## 2017-02-18 DIAGNOSIS — C7931 Secondary malignant neoplasm of brain: Secondary | ICD-10-CM | POA: Diagnosis not present

## 2017-02-18 DIAGNOSIS — C50919 Malignant neoplasm of unspecified site of unspecified female breast: Secondary | ICD-10-CM

## 2017-02-18 MED ORDER — GADOBENATE DIMEGLUMINE 529 MG/ML IV SOLN
20.0000 mL | Freq: Once | INTRAVENOUS | Status: AC | PRN
  Administered 2017-02-18: 17 mL via INTRAVENOUS

## 2017-02-23 ENCOUNTER — Encounter (HOSPITAL_COMMUNITY)
Admission: RE | Admit: 2017-02-23 | Discharge: 2017-02-23 | Disposition: A | Discharge status: Home / Self Care | Payer: Medicare HMO | Source: Ambulatory Visit | Attending: Hematology | Admitting: Hematology

## 2017-02-23 DIAGNOSIS — Z170 Estrogen receptor positive status [ER+]: Secondary | ICD-10-CM | POA: Diagnosis not present

## 2017-02-23 DIAGNOSIS — C50511 Malignant neoplasm of lower-outer quadrant of right female breast: Secondary | ICD-10-CM | POA: Insufficient documentation

## 2017-02-23 DIAGNOSIS — C50911 Malignant neoplasm of unspecified site of right female breast: Secondary | ICD-10-CM | POA: Diagnosis not present

## 2017-02-23 LAB — GLUCOSE, CAPILLARY: Glucose-Capillary: 92 mg/dL (ref 65–99)

## 2017-02-23 MED ORDER — FLUDEOXYGLUCOSE F - 18 (FDG) INJECTION
9.7000 | Freq: Once | INTRAVENOUS | Status: AC | PRN
  Administered 2017-02-23: 9.7 via INTRAVENOUS

## 2017-02-24 ENCOUNTER — Other Ambulatory Visit: Payer: Medicare PPO

## 2017-02-24 ENCOUNTER — Ambulatory Visit: Payer: Medicare PPO | Admitting: Hematology

## 2017-02-25 NOTE — Progress Notes (Signed)
[place]  [phone] [phone]  Clinic follow up Note   Patient Care Team: [name], MD as PCP - Charissa Bash, MD as Consulting Physician (Hematology) Gardenia Phlegm, NP as Nurse Practitioner (Hematology and Oncology)  02/28/2017  CHIEF COMPLAINTS Follow up breast cancer     Breast cancer of lower-outer quadrant of right female breast ([place])   09/03/2008 Initial Diagnosis    right breast cancer, T2N1Mo, stage IIA, ER+, PR+, HER2+ (ration 2.07). Pt declined surgery or other treatment.        05/20/2011 Tumor Marker    right breast mass biopsy showed IDA, ER+/PR+/HER2- (ratio 1.37)      09/04/2011 Cancer Staging    PET scan showed no distant mets      10/08/2014 Progression     lumbar spine MRI showed multiple bone metastasis , highly suspicious for  metastatic disease.      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 Increased size of right pleural effusion, indeterminate for malignant involvement of the pleural fluids.      01/20/2016 Imaging    CT CAP with contrast showed small bilateral pleural effusion, right greater than left, similar osseous metastasis, no evidence of extraosseous metastasis. Bone scan showed no areas of suspicious uptake.      09/27/2016 Imaging    CT CAP w contrast 1. Heterogeneous perfusion of the liver identified on today's study with some areas of hypoattenuating nodularity. This raises concern for development of liver metastases although this is not a  definite finding today may simply reflect heterogeneous perfusion. MRI of the abdomen without and with contrast would prove helpful to further evaluate, as clinically warranted. 2. Imaging features suggest bony metastatic disease, stable in the interval. Given lack of uptake on bone scan performed earlier today, these bony changes likely reflect treated disease. 3. Persistent marked skin thickening and edema diffusely in the right breast tracking into the region of the right axilla. Not substantially changed in the interval. 4. Interval resolution of small bilateral pleural effusions with posterior right lower lobe collapse/consolidation.      10/10/2016 Imaging    MR Abdomen w/wo contrast 1. Unusual perfusion abnormality involving the liver could be related to prior radiation effect but I do not see any MR findings suspicious for hepatic metastatic disease. The portal and hepatic veins are patent. 2. Stable surgical and radiation changes involving the right breast. No significant change in 21.5 mm irregular enhancing right lateral breast lesion. 3. Diffuse osseous metastatic disease.      10/13/2016 - 10/22/2016 Radiation Therapy    SRS treatment 5 fractions to the left orbit bone metastasis with Dr. Tammi Klippel.      02/01/2017 Imaging    CT CAP  IMPRESSION: Chest Impression: 1. No evidence of soft tissue metastasis in thorax. 2. Bilateral pleural effusions are increased. 3. Focus of round atelectasis RIGHT lung base.  Abdomen / Pelvis Impression: 1. No evidence of metastasis in soft tissue. 2. Nodular liver consistent with cirrhosis. 3. Diffuse sclerotic skeletal  metastasis.      02/02/2017 Imaging    Bone Scan IMPRESSION: 1. Several vague areas of increased activity in the ribs primarily the left anterior 6th or seventh rib but no CT correlate is seen to indicate definite metastatic lesion. Early metastatic lesions cannot be excluded. 2. Increased activity along the right  parietal calvarium could indicate metastatic involvement. 3. A focus of sclerosis within the right iliac bone by yesterday's CT does not demonstrate increased activity on bone scan.      02/18/2017 Imaging    MRI Abdomen 02/18/17 IMPRESSION: 1. Diffuse osseous metastatic disease, similar prior exams. 2. Diffuse abnormal signal throughout the liver probably representing a combination of macronodular hepatic cirrhosis with extensive fibrosis. The fibrotic portions are interspersed but are more prominent peripherally in the liver. No early arterial phase masslike enhancement to suggest hepatocellular carcinoma. Occasionally diffuse hepatic metastatic disease can cause a similar complex pattern, although cirrhosis is favored in this case. Sensitivity for smaller metastatic lesions to the liver is reduced due to the severity of the underlying global signal abnormality in the liver. 3. Considerable gallbladder wall thickening, although this may well be due to hypoproteinemia/hypoalbuminemia rather than necessarily being from inflammation. There is third spacing of fluid in the subcutaneous and mesenteric tissues which also suggests third spacing of fluid. 4. No biliary dilatation. 5. Chronic appearance of round atelectasis in the right lower lobe, potentially with tethering to a focus of pleural thickening and mild pleural enhancement as has been shown on multiple prior exams. Moderate to large right and moderate left pleural effusion.      02/23/2017 PET scan    PET 02/23/17 IMPRESSION: 1. Similar appearance of abnormal asymmetric skin thickening and soft tissue infiltration involving the right breast compatible with known multifocal breast cancer. Some of these changes may reflect treatment related changes. 2. No hypermetabolic soft tissue mass or adenopathy identified. 3. There is a focal area of abnormal asymmetric increased uptake localizing to the anterior aspect of the left sixth  rib. If there is a recent history of trauma to this area findings may reflect posttraumatic uptake. Metabolically active focal bone metastasis not excluded. Clinical correlation advise. 4. Persistent bilateral pleural effusions. 5. Aortic atherosclerosis          HISTORY OF PRESENTING ILLNESS (10/30/2014):  Margaret Rubio 76 y.o. female  Was referred by her primary care physician to discuss the management probable metastatic breast cancer.  She was initially diagnosed with right breast cancer in November 2009. 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 In December of 2009, she underwent a right axillary lymph node biopsy on 09/24/2008 which showed metastatic ductal carcinoma. (Case No: [id number]) The tumor was ER 100% positive, PR 0% negative, Ki67 - 22%. 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 The ratio of HER-2:CEP 17 was 2.07.  She decided to pursue nature therapy by diet and exercise, and did not have surgery and other tranditional therapy for her breast cancer.   In May 2011 she underwent a mammogram that showed a 3.8 cm invasive duct carcinoma in the subareolar region of the right breast which had increased since November 2009.  The tumor was ER  99% positive,  PR 4% positive, HER-2 negative ( ratio 1.37).  She was seen by Dr. Estella Husk and Dr. Lucia Gaskins.  However, she failed multiple appointments and sought alternative medical care in [place]. This involved putting  something on her right breast that caused some scarring.  She noticed the enlarged right breast mass and nipple inversion about one year ago. No pain, but some skin change. She developed left low back/buttock and hip and  pain in Nov 2015, was seen by PCP Dr. Jenny Reichmann who prescribed vicodin and tramadol and pain improved with meds. Due to the persistent pain, she underwent a lumber MRI  On 10/08/2014, which showed  multiple bone metastasis.  She run out pain medication about a week ago,  But did not call for refill. She states her pain is not bad , but she appears to be uncomfortable when she stands up and walks.  She otherwise feels well overall,  Has good appetite and energy level. She lives alone and still functions well.  She comes in with her niece today.   Current therapy:  1.Anastrozole 1 mg once daily started on 12/10/2014 2. Ibrance added on 07/29/15, stopped in Nov 2016, restarted on 01/10/2016, held during her radiation in 09/2016  3. Xgeva started on 08/15/2015, changed to every 3 months after 09/29/2016  --All held due to liver fialure since 02/08/2017   INTERIM HISTORY:  Margaret Rubio returns for follow-up and discuss PET result. She presents to the clinic today with her daughter. Her daughter reports her having an abnormal read and cannot pin point if she has cirrhosis  and if she wanted to do a biopsy. The patient said no. She is not eating a lot when alone due to her feeling nauseous. Her body feels off to her. She usually has 2 BM a day but now it is just 1 BM. These are things her daughter noticed. Her urination is darker like tea now. She is able to move around the house and denies fever or chills. She does have a cough and mucous come up. Her daughter presented an FMLA form for herself. Her daughter is leaving tomorrow and the other is daughter is coming soon. She is not on any blood thinner.       MEDICAL HISTORY:  Past Medical History:  Diagnosis Date  . 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 ASTHMA 01/31/2008  . Breast cancer [facility]) dx 2009   right breast  . BREAST CANCER, HX OF 11/14/2009  . BRONCHITIS, ACUTE 01/31/2008  . Cough 10/17/2009  . FATIGUE 11/14/2009  . GANGLION CYST 05/01/2009  . HYPERLIPIDEMIA 05/21/2007  . HYPERTENSION 05/05/2007  . LOW BACK PAIN 05/21/2007  . MENOPAUSAL DISORDER 05/22/2010  . Pain in joint, lower leg 05/10/2008  . RASH-NONVESICULAR 05/01/2009  . SHOULDER PAIN,  LEFT 11/14/2009  . 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 VITAMIN B12 DEFICIENCY 05/23/2010    SURGICAL HISTORY: Past Surgical History:  Procedure Laterality Date  . OTHER SURGICAL HISTORY     fibroid removal  . removal of neck lump     benigh 1986    Social History   Social History  . Marital status: Widowed    Spouse name: N/A  . Number of children: 1  . Years of education: N/A   Social History Main Topics  . Smoking status: Never Smoker  . Smokeless tobacco: Never Used  . Alcohol use No  . Drug use: No  . Sexual activity: No   Other Topics Concern  . None   Social History Narrative  . None    FAMILY HISTORY: Family History  Problem Relation Age of Onset  . Hypertension Father   . Heart disease Father   . Asthma Other   .  Diabetes Other   . Hypertension Other   . Cancer Paternal Aunt        leukemia ?  Marland Kitchen Colon cancer Neg Hx   . Esophageal cancer Neg Hx   . Pancreatic cancer Neg Hx   . Stomach cancer Neg Hx   . Liver disease Neg Hx   Paternal aunt had cancer, unknown type. No other family history of malignancy   ALLERGIES:  is allergic to asa [aspirin] and tomato.  MEDICATIONS:  Current Outpatient Prescriptions on File Prior to Visit  Medication Sig Dispense Refill  . Ascorbic Acid (VITAMIN C) 100 MG CHEW Chew 1 tablet (100 mg total) by mouth every morning. 90 each 3  . calcium carbonate 1250 MG capsule Take 1,250 mg by mouth 2 (two) times daily with a meal. Not sure of type or dose    . cholecalciferol (VITAMIN D) 1000 units tablet Take 1,000 Units by mouth daily. Patient not sure of dose    . clotrimazole (GYNE-LOTRIMIN 3) 2 % vaginal cream Apply to affected bilaterall axilla sites as needed for rash. 21 g 1  . Cyanocobalamin (B-12 COMPLIANCE INJECTION IJ) Inject as directed every 30 (thirty) days.    . Nystatin POWD 1 apply to right axilla twice daily until rashes resolves 1 Bottle 1  . ondansetron (ZOFRAN) 4 MG tablet Take 1 tablet (4 mg total) by mouth  every 6 (six) hours. 50 tablet 2   No current facility-administered medications on file prior to visit.   ; REVIEW OF SYSTEMS:  Constitutional: Denies fevers, chills or abnormal night sweats, (+) ankle and feet swelling, increased appetite, low in protein, (+) low energy (+) low appetite  Eyes: Denies blurriness of vision, double vision or watery eyes (+) jaundice in eyes due to liver failure Ears, nose, mouth, throat, and face: Denies mucositis or sore throat,  Respiratory: Denies dyspnea or wheezes (+) cough with mucous Cardiovascular: Denies palpitation, chest discomfort or lower extremity swelling Gastrointestinal:  Denies heartburn or change in bowel habits (+) gassy (+) dark urine due to Liver failure (+) nausea Skin: Denies abnormal skin rashes  Lymphatics: Denies new lymphadenopathy or easy bruising Neurological:Denies numbness, tingling or new weaknesses Behavioral/Psych: Mood is stable, no new changes  All other systems were reviewed with the patient and are negative. [place]: (+) right upper arm pain  PHYSICAL EXAMINATION:  ECOG PERFORMANCE STATUS: 1 BP (!) 155/74 (BP Location: Left Arm, Patient Position: Sitting)   Pulse 79   Temp 97.8 F (36.6 C) (Oral)   Resp 20   Ht '5\' 5"'$  (1.651 m)   Wt 185 lb 12.8 oz (84.3 kg)   SpO2 100%   BMI 30.92 kg/m    GENERAL:alert, no distress and comfortable SKIN: texture, turgor are normal, no significant lesions, (+) skin color jundice EYES: normal, conjunctiva are pink and non-injected (+) has juandice in eyes  OROPHARYNX:no exudate, no erythema and lips, buccal mucosa, and tongue normal  NECK: supple, thyroid normal size, non-tender, without nodularity LYMPH:  no palpable lymphadenopathy in the cervical, axillary or inguinal LUNGS: clear to auscultation and percussion with normal breathing effort, slightly decreased breath sounds at the bottom of right lung. HEART: regular rate & rhythm and no murmurs and no lower extremity  edema ABDOMEN:abdomen soft, non-tender and normal bowel sounds (+) pain Musculoskeletal:no cyanosis of digits and no clubbing, (+) mild edema on both legs and both feet, up to knee, L>R PSYCH: alert & oriented x 3 with fluent speech NEURO: no focal motor/sensory deficits.  Her left eye ball has limited range of movement  Breasts: Breast inspection showed them to be symmetrical with no nipple discharge. Palpation of the breasts and axilla revealed no obvious mass that I could appreciate.Marland Kitchen    LABORATORY DATA:  I have reviewed the data as listed CBC Latest Ref Rng & Units 02/28/2017 02/11/2017 02/07/2017  WBC 3.9 - 10.3 10e3/uL 5.9 5.7 4.8  Hemoglobin 11.6 - 15.9 g/dL 11.1(L) 11.2(L) 10.9(L)  Hematocrit 34.8 - 46.6 % 32.8(L) 32.9(L) 31.7(L)  Platelets 145 - 400 10e3/uL 139(L) 220.0 211    CMP Latest Ref Rng & Units 02/28/2017 02/11/2017 02/07/2017  Glucose 70 - 140 mg/dl 100 90 88  BUN 7.0 - 26.0 mg/dL 6.8(L) 10 7.8  Creatinine 0.6 - 1.1 mg/dL 0.7 0.70 0.7  Sodium 136 - 145 mEq/L 137 134(L) 134(L)  Potassium 3.5 - 5.1 mEq/L 3.6 3.9 3.9  Chloride 96 - 112 mEq/L - 101 -  CO2 22 - 29 mEq/L '24 25 22  '$ Calcium 8.4 - 10.4 mg/dL 8.4 8.6 8.7  Total Protein 6.4 - 8.3 g/dL 6.4 6.7 6.6  Total Bilirubin 0.20 - 1.20 mg/dL 12.74(HH) 6.9(H) 5.4(H)  Alkaline Phos 40 - 150 U/L 379(H) 319(H) 387(H)  AST 5 - 34 U/L 209(HH) 171(H) 202 Repeated and Verified(HH)  ALT 0 - 55 U/L 112(H) 93(H) 109(H)   CA27.29: 06/30/2012: 28 10/30/2014: 355 08/15/2015: 424 12/27/2014: 414 05/05/2016: 416.1 06/10/2016: 332.4 07/01/2016: 348.8 08/03/2016: 388.1 09/01/2016: 321.1 10/26/16: 404.3 11/30/16: 403.7 01/03/17: 815.9 02/07/17: 857.9 02/28/2017: PENDING   PATHOLOGY REPORT  RIGHT BREAST MASS, 6 O'CLOCK, NEEDLE CORE BIOPSIES: 09/03/2008 MICROSCOPIC FOCUS OF DUCTAL CARCINOMA IN SITU ASSOCIATED WITH MICROSCOPIC FOCUS OF MICROINVASION. SEE COMMET.  LYMPH NODE, RIGHT AXILLARY, NEEDLE CORE BIOPSIES: METASTATIC DUCTAL  CARCINOMA.  09/24/2008  Diagnosis 05/20/2011 Breast, right, needle core biopsy - INVASIVE DUCTAL CARCINOMA, SEE COMMENT. - LYMPHOVASCULAR INVASION PRESENT. Estrogen Receptor (Negative, <1%): 99%, STRONG STAINING INTENSITY Progesterone Receptor (Negative, <1%): 4%, STRONG HER-2/NEU BY CISH - NO AMPLIFICATION OF HER-2 DETECTED. THE RATIO OF HER-2: CEP 17 SIGNALS WAS 1.38.  Breast, right, needle core biopsy, 6:30 o'clock 12/13/2014 - INVASIVE MAMMARY CARCINOMA. - SEE MICROSCOPIC DESCRIPTION.  Diagnosis 08/01/2015 PLEURAL FLUID, RIGHT(SPECIMEN 1 OF 1 COLLECTED 08/01/15): METASTATIC CARCINOMA CONSISTENT WITH BREAST PRIMARY.   RADIOGRAPHIC STUDIES: I have personally reviewed the radiological images as listed and agreed with the findings in the report.  PET 07/17/2015 IMPRESSION: 1. Suspected active diverticulitis along the sigmoid colon, with focal mesenteric stranding and a small locule of gas which is either within an inflamed diverticulum or a micro perforation. No overt free air. 2. Overall similar appearance of multifocal right breast cancer with extensive osseous metastatic disease. The right axillary lymph nodes are less notable that there is a borderline hypermetabolic left axillary lymph node currently. 3. Increasing size of the right pleural effusion, now moderate. Appearance indeterminate for malignant involvement of the pleural fluid. 4. Faintly increased activity in a right infrahilar lymph node which could represent early metastatic spread. 5. Other imaging findings of potential clinical significance: Chronic left maxillary sinusitis. Low-density blood pool suggests anemia. Left adrenal adenoma. These results will be called to the ordering clinician or representative by the Radiologist Assistant, and communication documented in the PACS or zVision Dashboard.  Brain MRI with and without contrast on 06/27/2015, at [facility] -In  determinate, ill-defined soft tissue infiltration in the left orbit with questionable enhancement, further evaluation with CT orbits with contrast is recommended to the cyst  in ruling out metastasis. -No intracranial metastasis -Diffusely hypoechoic intense bone marrow signal throughout the skull base   Bone scan 05/31/2016 IMPRESSION: New increased up take in the ribs bilaterally worrisome for metastatic disease. Stable increased uptake in the right fronto poor parietal bone.  A bilateral rib series is recommended.   CT chest, abdomen and pelvis with contrast 05/31/2016 IMPRESSION: 1. Similar fit appearance of widespread it treated osseous metastatic disease, no progression. No findings of new extra osseous metastatic disease. 2. Continued skin thickening and edema in the right breast. The edema in the right breast and tracking in the right chest wall is slightly increased compared to prior and the lateral soft tissue density in the right lateral breast is stable. 3. Similar small bilateral pleural effusions with some atelectasis at the right lung base possibly tethered to the parietal pleura. 4. Mild coronary artery atherosclerosis and atherosclerotic calcification of the aortic arch. 5. Airway thickening is present, suggesting bronchitis or reactive airways disease.  MR orbits w/wo contrast 07/22/2016  IMPRESSION: 1. Diffuse metastasis within the left orbit intraconal space, which likely invades the left extraocular muscles. No abnormal enhancement of the left optic nerve to strongly indicate nerve invasion although there does appear to be abnormal T2 signal within the substance of the L CN2, such as due to neuritis. The left globe appears spared. 2. No other soft tissue metastasis identified, an the right orbit appears normal. Evidence of osseous metastatic disease in the cervical spine and possibly also the calvarium.  CT CAP W Contrast 09/28/16 IMPRESSION: 1.  Heterogeneous perfusion of the liver identified on today's study with some areas of hypoattenuating nodularity. This raises concern for development of liver metastases although this is not a definite finding today may simply reflect heterogeneous perfusion. MRI of the abdomen without and with contrast would prove helpful to further evaluate, as clinically warranted. 2. Imaging features suggest bony metastatic disease, stable in the interval. Given lack of uptake on bone scan performed earlier today, these bony changes likely reflect treated disease. 3. Persistent marked skin thickening and edema diffusely in the right breast tracking into the region of the right axilla. Not substantially changed in the interval. 4. Interval resolution of small bilateral pleural effusions with posterior right lower lobe collapse/consolidation.   MR Abdomen w/wo contrast 10/10/2016 IMPRESSION: 1. Unusual perfusion abnormality involving the liver could be related to prior radiation effect but I do not see any MR findings suspicious for hepatic metastatic disease. The portal and hepatic veins are patent. 2. Stable surgical and radiation changes involving the right breast. No significant change in 21.5 mm irregular enhancing right lateral breast lesion. 3. Diffuse osseous metastatic disease.  CT CAP 02/01/17 IMPRESSION: Chest Impression: 1. No evidence of soft tissue metastasis in thorax. 2. Bilateral pleural effusions are increased. 3. Focus of round atelectasis RIGHT lung base.  Abdomen / Pelvis Impression: 1. No evidence of metastasis in soft tissue. 2. Nodular liver consistent with cirrhosis. 3. Diffuse sclerotic skeletal metastasis.  Bone Scan 02/02/17 IMPRESSION: 1. Several vague areas of increased activity in the ribs primarily the left anterior 6th or seventh rib but no CT correlate is seen to indicate definite metastatic lesion. Early metastatic lesions cannot be excluded. 2. Increased  activity along the right parietal calvarium could indicate metastatic involvement. 3. A focus of sclerosis within the right iliac bone by yesterday's CT does not demonstrate increased activity on bone scan.  US Abdomen 02/08/17 IMPRESSION: 1. Nodular liver as seen on the recent CT  Abdomen and Pelvis suspicious for cirrhosis. This is felt to explain the circumferential gallbladder wall thickening on the basis of hepatocellular inflammation. No cholelithiasis or strong evidence of acute cholecystitis. 2. No evidence of biliary obstruction. 3. Right pleural effusion re- identified.  MRI Abdomen 02/18/17 IMPRESSION: 1. Diffuse osseous metastatic disease, similar prior exams. 2. Diffuse abnormal signal throughout the liver probably representing a combination of macronodular hepatic cirrhosis with extensive fibrosis. The fibrotic portions are interspersed but are more prominent peripherally in the liver. No early arterial phase masslike enhancement to suggest hepatocellular carcinoma. Occasionally diffuse hepatic metastatic disease can cause a similar complex pattern, although cirrhosis is favored in this case. Sensitivity for smaller metastatic lesions to the liver is reduced due to the severity of the underlying global signal abnormality in the liver. 3. Considerable gallbladder wall thickening, although this may well be due to hypoproteinemia/hypoalbuminemia rather than necessarily being from inflammation. There is third spacing of fluid in the subcutaneous and mesenteric tissues which also suggests third spacing of fluid. 4. No biliary dilatation. 5. Chronic appearance of round atelectasis in the right lower lobe, potentially with tethering to a focus of pleural thickening and mild pleural enhancement as has been shown on multiple prior exams. Moderate to large right and moderate left pleural effusion.   PET 02/23/17 IMPRESSION: 1. Similar appearance of abnormal asymmetric skin  thickening and soft tissue infiltration involving the right breast compatible with known multifocal breast cancer. Some of these changes may reflect treatment related changes. 2. No hypermetabolic soft tissue mass or adenopathy identified. 3. There is a focal area of abnormal asymmetric increased uptake localizing to the anterior aspect of the left sixth rib. If there is a recent history of trauma to this area findings may reflect posttraumatic uptake. Metabolically active focal bone metastasis not excluded. Clinical correlation advise. 4. Persistent bilateral pleural effusions. 5. Aortic atherosclerosis    ASSESSMENT & PLAN:   Margaret Rubio is a 76 y.o. African-American female, who was diagnosed with node positive right breast  Invasive adenocarcinoma in  2009, untreated,  Now presents with low back pain and lumbar spine MRI is highly suspicious for bone metastasis.  1. Worsening transaminitis and hyperbilirubinemia, and liver failure  -She started having mild elevated liver enzymes about 4-5 months before, much worse lately -CT scan on 09/27/16 showed heterogeneous perfusion in the liver. -Liver MRI on 10/10/16 showed unusual perfusion abnormality involving the liver, but no definite evidence of metastasis. -repeated staging CT scan on 02/01/2017 did not show evidence of liver metastasis, but nodular liver consistent with liver cirrhosis, she has no history of liver disease in the past, hepatitis B and C were negative, she does not drink alcohol much. -Ultrasound abdomen this morning showed nodular liver suspicious for liver cirrhosis. No bili obstruction.  -I referred her to GI, abd MRI was done which showed liver cirrhosis, no image evidence of liver metastasis -I discussed her restaging PET scan from 02/23/2017, which showed hypermetabolic right breast cancer, otherwise negative, no hypermetabolic lesions in the liver -I have held anastrozole and Ibrance since early May however her  liver function continues getting worse,  -Given her underlying metastatic breast cancer, infiltrative liver metastasis is likely,  I strongly recommend patient to have liver biopsy. After lengthy discussion, she agreed today.  - we discussed that if her liver biopsy shows diffuse liver metastasis from her breast cancer, I will likely offer palliative chemotherapy, however her chemotherapy choice is very limited due to her liver failure  - lab  results reviewed with patient and her daughter, her total bilirubin is 12.7 today  - I spoke with IR Dr. Pascal Lux today, her liver biopsy is scheduled for tomorrow.   2. 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 -she knows that her disease at this stage is incurable. The goal of treatment is palliative and to prolong her life. -she is on first line anastrozole, Margaret Rubio was added on in October 2016 due to slight disease progression. She was not very compliant, off Ibrance for 3-4 months and restarted in 01/2016. -I previously discussed her restaging CT and bone scan from 05/31/2016, which showed diffuse similar appearing bone lesions, no other new metastasis. Bone scan showed mild bilateral rib uptake, but no corresponding findings on the CT. I think she has stable disease overall. -I previously reviewed her restaging CT scan and bone scan from 09/27/2016, which showed stable bone mets. CT scan showed heterogeneous perfusion of the liver indeterminate -I previously reviewed MR abdomen scan from 10/10/2016 with the patient, which showed no evidence of metastatic disease in liver  -She has  developed worsening liver function, especially upper bilirubinemia, highly suspicious for diffuse liver metastasis, although this no evidence on the recent MRI or PET -She is scheduled for liver biopsy tomorrow -We'll continue to hold anastrozole and Ibrance for now - consider palliative chemotherapy if her liver biopsy confirms metastasis   3. Left orbit bone metastasis  -this was found on her brain MRI in 06/2015 when she had heaache, she has issues with left eye ball movement, was evaluated by Dr. Emilio Math at [facility] -this is likely a metastatic lesion from her breast cancer -I previously discussed her orbital MRI scan findings, which showed diffuse metastases to status is within the left orbital with invading the left extraocular muscles. No optical nerve involvement. -She previously completed treatment with radiation oncologist Dr. Tammi Klippel, who delivered 5 fractions of SRS to her left orbit bone metastasis   4. Low back/ hip pain, Secondary to metastatic cancer to bones  -this has resolved since she started anastrozole -continue Xgeva every 3 months, will monitor  5. Anemia -Likely secondary to her breast cancer, bone metastases and B12 deficiency -Slightly improved after B12 injections. Continue B12 monthly for now. -No need for transfusion. We'll follow up closely. -The patient was previously neutropenic because she took 2 extra doses of Ibrance.   6. Goal of care discussion  -We previously discussed the incurable nature of her cancer, and the overall poor prognosis, especially if he does not have good response to treatment or progress on treatment -The patient understands the goal of care is palliative. -I previously  recommend DNR/DNI, she will think about it   7. Nausea and fatigue -she will use compazine as needed for nausea  -I'll also previously encourage her to try Maalox for her gastric discomfort -Refill compazine 1-2 tablets  every 8 hours  8. LE edema -She has previously  developed left foot pain and swollen, on exam she has bilateral lower extremity edema, mild, more prominent in the left -Her previously  x-ray was negative -I previously obtain a bilateral lower  extremity Doppler to ruled out DVT which was negative on 01/07/2017 -Feet swelling as of 02/03/17     Plan:  - liver Biopsy tomorrow, spoke with Dr. Pascal Lux  -f/u later this week  -refill Compazine today -will continue to hold anastrozole and Ibrance due to liver failure -Fill out FMLA form for patient's daughter   All questions were answered. The patient knows to call the clinic with any problems, questions or concerns.  I spent 30 minutes counseling the patient face to face. The total time spent in the appointment was 40 minutes and more than 50% was on counseling.  This document serves as a record of services personally performed by Truitt Merle, MD. It was created on her behalf by Joslyn Devon, a trained medical scribe. The creation of this record is based on the scribe's personal observations and the provider's statements to them. This document has been checked and approved by the attending provider.     Truitt Merle, MD 02/28/2017

## 2017-02-28 ENCOUNTER — Ambulatory Visit (HOSPITAL_BASED_OUTPATIENT_CLINIC_OR_DEPARTMENT_OTHER): Payer: Medicare HMO | Admitting: Hematology

## 2017-02-28 ENCOUNTER — Other Ambulatory Visit (HOSPITAL_BASED_OUTPATIENT_CLINIC_OR_DEPARTMENT_OTHER): Payer: Medicare HMO

## 2017-02-28 ENCOUNTER — Other Ambulatory Visit: Payer: Self-pay | Admitting: Hematology

## 2017-02-28 ENCOUNTER — Encounter: Payer: Self-pay | Admitting: Hematology

## 2017-02-28 VITALS — BP 155/74 | HR 79 | Temp 97.8°F | Resp 20 | Ht 65.0 in | Wt 185.8 lb

## 2017-02-28 DIAGNOSIS — Z170 Estrogen receptor positive status [ER+]: Secondary | ICD-10-CM | POA: Diagnosis not present

## 2017-02-28 DIAGNOSIS — K7290 Hepatic failure, unspecified without coma: Secondary | ICD-10-CM

## 2017-02-28 DIAGNOSIS — I10 Essential (primary) hypertension: Secondary | ICD-10-CM

## 2017-02-28 DIAGNOSIS — R740 Nonspecific elevation of levels of transaminase and lactic acid dehydrogenase [LDH]: Secondary | ICD-10-CM | POA: Diagnosis not present

## 2017-02-28 DIAGNOSIS — C50919 Malignant neoplasm of unspecified site of unspecified female breast: Secondary | ICD-10-CM | POA: Diagnosis not present

## 2017-02-28 DIAGNOSIS — R110 Nausea: Secondary | ICD-10-CM

## 2017-02-28 DIAGNOSIS — R609 Edema, unspecified: Secondary | ICD-10-CM

## 2017-02-28 DIAGNOSIS — R5383 Other fatigue: Secondary | ICD-10-CM | POA: Diagnosis not present

## 2017-02-28 DIAGNOSIS — C7951 Secondary malignant neoplasm of bone: Secondary | ICD-10-CM | POA: Diagnosis not present

## 2017-02-28 DIAGNOSIS — D649 Anemia, unspecified: Secondary | ICD-10-CM

## 2017-02-28 DIAGNOSIS — J90 Pleural effusion, not elsewhere classified: Secondary | ICD-10-CM

## 2017-02-28 DIAGNOSIS — C50511 Malignant neoplasm of lower-outer quadrant of right female breast: Secondary | ICD-10-CM

## 2017-02-28 DIAGNOSIS — D630 Anemia in neoplastic disease: Secondary | ICD-10-CM

## 2017-02-28 DIAGNOSIS — C50911 Malignant neoplasm of unspecified site of right female breast: Secondary | ICD-10-CM

## 2017-02-28 LAB — COMPREHENSIVE METABOLIC PANEL
ALT: 112 U/L — AB (ref 0–55)
ANION GAP: 11 meq/L (ref 3–11)
AST: 209 U/L — AB (ref 5–34)
Albumin: 2.7 g/dL — ABNORMAL LOW (ref 3.5–5.0)
Alkaline Phosphatase: 379 U/L — ABNORMAL HIGH (ref 40–150)
BILIRUBIN TOTAL: 12.74 mg/dL — AB (ref 0.20–1.20)
BUN: 6.8 mg/dL — AB (ref 7.0–26.0)
CHLORIDE: 102 meq/L (ref 98–109)
CO2: 24 meq/L (ref 22–29)
CREATININE: 0.7 mg/dL (ref 0.6–1.1)
Calcium: 8.4 mg/dL (ref 8.4–10.4)
EGFR: >90 mL/min/{1.73_m2} (ref >90)
GLUCOSE: 100 mg/dL (ref 70–140)
Potassium: 3.6 mEq/L (ref 3.5–5.1)
Sodium: 137 mEq/L (ref 136–145)
TOTAL PROTEIN: 6.4 g/dL (ref 6.4–8.3)

## 2017-02-28 LAB — CBC WITH DIFFERENTIAL/PLATELET
BASO%: 0.6 % (ref 0.0–2.0)
Basophils Absolute: 0 10*3/uL (ref 0.0–0.1)
EOS%: 0.3 % (ref 0.0–7.0)
Eosinophils Absolute: 0 10*3/uL (ref 0.0–0.5)
HCT: 32.8 % — ABNORMAL LOW (ref 34.8–46.6)
HGB: 11.1 g/dL — ABNORMAL LOW (ref 11.6–15.9)
LYMPH#: 1.3 10*3/uL (ref 0.9–3.3)
LYMPH%: 22.8 % (ref 14.0–49.7)
MCH: 31.2 pg (ref 25.1–34.0)
MCHC: 33.7 g/dL (ref 31.5–36.0)
MCV: 92.7 fL (ref 79.5–101.0)
MONO#: 0.6 10*3/uL (ref 0.1–0.9)
MONO%: 10.7 % (ref 0.0–14.0)
NEUT%: 65.6 % (ref 38.4–76.8)
NEUTROS ABS: 3.9 10*3/uL (ref 1.5–6.5)
PLATELETS: 139 10*3/uL — AB (ref 145–400)
RBC: 3.54 10*6/uL — AB (ref 3.70–5.45)
RDW: 14.3 % (ref 11.2–14.5)
WBC: 5.9 10*3/uL (ref 3.9–10.3)

## 2017-02-28 MED ORDER — PROCHLORPERAZINE MALEATE 5 MG PO TABS: 5.0000 mg | ORAL_TABLET | Freq: Three times a day (TID) | ORAL | 0 refills | Status: AC | PRN

## 2017-03-01 ENCOUNTER — Encounter (HOSPITAL_COMMUNITY): Payer: Self-pay

## 2017-03-01 ENCOUNTER — Ambulatory Visit (HOSPITAL_COMMUNITY)
Admission: RE | Admit: 2017-03-01 | Discharge: 2017-03-01 | Disposition: A | Discharge status: Home / Self Care | Payer: Medicare HMO | Source: Ambulatory Visit | Attending: Hematology | Admitting: Hematology

## 2017-03-01 DIAGNOSIS — R110 Nausea: Secondary | ICD-10-CM | POA: Insufficient documentation

## 2017-03-01 DIAGNOSIS — Z853 Personal history of malignant neoplasm of breast: Secondary | ICD-10-CM | POA: Insufficient documentation

## 2017-03-01 DIAGNOSIS — K7689 Other specified diseases of liver: Secondary | ICD-10-CM | POA: Diagnosis not present

## 2017-03-01 DIAGNOSIS — C50511 Malignant neoplasm of lower-outer quadrant of right female breast: Secondary | ICD-10-CM

## 2017-03-01 DIAGNOSIS — R740 Nonspecific elevation of levels of transaminase and lactic acid dehydrogenase [LDH]: Secondary | ICD-10-CM | POA: Diagnosis present

## 2017-03-01 DIAGNOSIS — E538 Deficiency of other specified B group vitamins: Secondary | ICD-10-CM | POA: Insufficient documentation

## 2017-03-01 DIAGNOSIS — Z886 Allergy status to analgesic agent status: Secondary | ICD-10-CM | POA: Diagnosis not present

## 2017-03-01 DIAGNOSIS — Z170 Estrogen receptor positive status [ER+]: Secondary | ICD-10-CM

## 2017-03-01 DIAGNOSIS — C787 Secondary malignant neoplasm of liver and intrahepatic bile duct: Secondary | ICD-10-CM | POA: Insufficient documentation

## 2017-03-01 DIAGNOSIS — K7460 Unspecified cirrhosis of liver: Secondary | ICD-10-CM | POA: Diagnosis not present

## 2017-03-01 DIAGNOSIS — M549 Dorsalgia, unspecified: Secondary | ICD-10-CM | POA: Insufficient documentation

## 2017-03-01 DIAGNOSIS — E785 Hyperlipidemia, unspecified: Secondary | ICD-10-CM | POA: Diagnosis not present

## 2017-03-01 DIAGNOSIS — I10 Essential (primary) hypertension: Secondary | ICD-10-CM | POA: Diagnosis not present

## 2017-03-01 HISTORY — DX: Personal history of other medical treatment: Z92.89

## 2017-03-01 HISTORY — DX: Dyspnea, unspecified: R06.00

## 2017-03-01 LAB — CBC
HCT: 29.3 % — ABNORMAL LOW (ref 36.0–46.0)
Hemoglobin: 10.3 g/dL — ABNORMAL LOW (ref 12.0–15.0)
MCH: 30.7 pg (ref 26.0–34.0)
MCHC: 35.2 g/dL (ref 30.0–36.0)
MCV: 87.2 fL (ref 78.0–100.0)
Platelets: 142 10*3/uL — ABNORMAL LOW (ref 150–400)
RBC: 3.36 MIL/uL — ABNORMAL LOW (ref 3.87–5.11)
RDW: 14.8 % (ref 11.5–15.5)
WBC: 5 10*3/uL (ref 4.0–10.5)

## 2017-03-01 LAB — PROTIME-INR
INR: 1.21
Prothrombin Time: 15.4 seconds — ABNORMAL HIGH (ref 11.4–15.2)

## 2017-03-01 LAB — CANCER ANTIGEN 27.29: CA 27.29: 806 U/mL — ABNORMAL HIGH (ref 0.0–38.6)

## 2017-03-01 LAB — APTT: aPTT: 38 seconds — ABNORMAL HIGH (ref 24–36)

## 2017-03-01 MED ORDER — FENTANYL CITRATE (PF) 100 MCG/2ML IJ SOLN
INTRAMUSCULAR | Status: AC
  Filled 2017-03-01: qty 4

## 2017-03-01 MED ORDER — SODIUM CHLORIDE 0.9 % IV SOLN
INTRAVENOUS | Status: DC
  Administered 2017-03-01: 13:00:00 via INTRAVENOUS

## 2017-03-01 MED ORDER — FENTANYL CITRATE (PF) 100 MCG/2ML IJ SOLN
INTRAMUSCULAR | Status: AC | PRN
  Administered 2017-03-01: 50 ug via INTRAVENOUS

## 2017-03-01 MED ORDER — MIDAZOLAM HCL 2 MG/2ML IJ SOLN
INTRAMUSCULAR | Status: AC | PRN
  Administered 2017-03-01 (×3): 1 mg via INTRAVENOUS

## 2017-03-01 MED ORDER — MIDAZOLAM HCL 2 MG/2ML IJ SOLN
INTRAMUSCULAR | Status: AC
  Filled 2017-03-01: qty 6

## 2017-03-01 NOTE — Discharge Instructions (Signed)
Liver Biopsy, Care After °These instructions give you information on caring for yourself after your procedure. Your doctor may also give you more specific instructions. Call your doctor if you have any problems or questions after your procedure. °Follow these instructions at home: °· Rest at home for 1-2 days or as told by your doctor. °· Have someone stay with you for at least 24 hours. °· Do not do these things in the first 24 hours: °¨ Drive. °¨ Use machinery. °¨ Take care of other people. °¨ Sign legal documents. °¨ Take a bath or shower. °· There are many different ways to close and cover a cut (incision). For example, a cut can be closed with stitches, skin glue, or adhesive strips. Follow your doctor's instructions on: °¨ Taking care of your cut. °¨ Changing and removing your bandage (dressing). °¨ Removing whatever was used to close your cut. °· Do not drink alcohol in the first week. °· Do not lift more than 5 pounds or play contact sports for the first 2 weeks. °· Take medicines only as told by your doctor. For 1 week, do not take medicine that has aspirin in it or medicines like ibuprofen. °· Get your test results. °Contact a doctor if: °· A cut bleeds and leaves more than just a small spot of blood. °· A cut is red, puffs up (swells), or hurts more than before. °· Fluid or something else comes from a cut. °· A cut smells bad. °· You have a fever or chills. °Get help right away if: °· You have swelling, bloating, or pain in your belly (abdomen). °· You get dizzy or faint. °· You have a rash. °· You feel sick to your stomach (nauseous) or throw up (vomit). °· You have trouble breathing, feel short of breath, or feel faint. °· Your chest hurts. °· You have problems talking or seeing. °· You have trouble balancing or moving your arms or legs. °This information is not intended to replace advice given to you by your health care provider. Make sure you discuss any questions you have with your health care  provider. °Document Released: 07/06/2008 Document Revised: 03/04/2016 Document Reviewed: 11/23/2013 °Elsevier Interactive Patient Education © 2017 [employer] °Moderate Conscious Sedation, Adult, Care After °These instructions provide you with information about caring for yourself after your procedure. Your health care provider may also give you more specific instructions. Your treatment has been planned according to current medical practices, but problems sometimes occur. Call your health care provider if you have any problems or questions after your procedure. °What can I expect after the procedure? °After your procedure, it is common: °· To feel sleepy for several hours. °· To feel clumsy and have poor balance for several hours. °· To have poor judgment for several hours. °· To vomit if you eat too soon. °Follow these instructions at home: °For at least 24 hours after the procedure:  ° °· Do not: °¨ Participate in activities where you could fall or become injured. °¨ Drive. °¨ Use heavy machinery. °¨ Drink alcohol. °¨ Take sleeping pills or medicines that cause drowsiness. °¨ Make important decisions or sign legal documents. °¨ Take care of children on your own. °· Rest. °Eating and drinking  °· Follow the diet recommended by your health care provider. °· If you vomit: °¨ Drink water, juice, or soup when you can drink without vomiting. °¨ Make sure you have little or no nausea before eating solid foods. °General instructions  °· Have   a responsible adult stay with you until you are awake and alert. °· Take over-the-counter and prescription medicines only as told by your health care provider. °· If you smoke, do not smoke without supervision. °· Keep all follow-up visits as told by your health care provider. This is important. °Contact a health care provider if: °· You keep feeling nauseous or you keep vomiting. °· You feel light-headed. °· You develop a rash. °· You have a fever. °Get help right away if: °· You  have trouble breathing. °This information is not intended to replace advice given to you by your health care provider. Make sure you discuss any questions you have with your health care provider. °Document Released: 07/18/2013 Document Revised: 03/01/2016 Document Reviewed: 01/17/2016 °Elsevier Interactive Patient Education © 2017 [employer] ° °

## 2017-03-01 NOTE — Procedures (Signed)
Interventional Radiology Procedure Note  Procedure: US guided core biopsy of liver, random  Complications: None  Estimated Blood Loss: None  Recommendations: - Bedrest x 3 hrs - DC home - Path is pending  Signed,  Criselda Peaches, MD

## 2017-03-01 NOTE — H&P (Signed)
Referring Physician(s): Feng,Yan  Supervising Physician: Jacqulynn Cadet  Patient Status:  [facility] OP  Chief Complaint:  "I'm getting a biopsy"  Subjective: Patient familiar to IR service from prior thoracentesis in 2016. She has a history of node-positive right breast adenocarcinoma diagnosed in 2009. She now has worsening transaminitis and hyperbilirubinemia with imaging revealing macronodular hepatic cirrhosis and extensive fibrosis although diffuse hepatic metastatic disease not completely excluded. She presents again today for image guided liver biopsy for further evaluation. She currently denies fever, headache, chest pain, cough, significant abdominal pain, vomiting or abnormal bleeding. She does have fatigue, dyspnea with exertion, occasional nausea, intermittent back pain and jaundice. Additional history as below. Past Medical History:  Diagnosis Date  . 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 ASTHMA 01/31/2008  . Breast cancer [facility]) dx 2009   right breast  . BREAST CANCER, HX OF 11/14/2009  . BRONCHITIS, ACUTE 01/31/2008  . Cough 10/17/2009  . FATIGUE 11/14/2009  . GANGLION CYST 05/01/2009  . HYPERLIPIDEMIA 05/21/2007  . HYPERTENSION 05/05/2007  . LOW BACK PAIN 05/21/2007  . MENOPAUSAL DISORDER 05/22/2010  . Pain in joint, lower leg 05/10/2008  . RASH-NONVESICULAR 05/01/2009  . SHOULDER PAIN, LEFT 11/14/2009  . 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 VITAMIN B12 DEFICIENCY 05/23/2010   Past Surgical History:  Procedure Laterality Date  . OTHER SURGICAL HISTORY     fibroid removal  . removal of neck lump     benigh 1986      Allergies: Asa [aspirin] and Tomato  Medications: Prior to Admission medications   Medication Sig Start Date End Date Taking? Authorizing Provider  Ascorbic Acid (VITAMIN C) 100 MG CHEW Chew 1 tablet (100 mg total) by mouth every morning. 01/31/15   [name], MD  calcium carbonate 1250 MG capsule Take 1,250 mg by mouth 2 (two) times daily with a meal. Not  sure of type or dose    [provider]  cholecalciferol (VITAMIN D) 1000 units tablet Take 1,000 Units by mouth daily. Patient not sure of dose    [provider]  clotrimazole (GYNE-LOTRIMIN 3) 2 % vaginal cream Apply to affected bilaterall axilla sites as needed for rash. 07/30/16   Susanne Borders, NP  Cyanocobalamin (B-12 COMPLIANCE INJECTION IJ) Inject as directed every 30 (thirty) days.    [provider]  Nystatin POWD 1 apply to right axilla twice daily until rashes resolves 10/26/16   Truitt Merle, MD  ondansetron (ZOFRAN) 4 MG tablet Take 1 tablet (4 mg total) by mouth every 6 (six) hours. 02/11/17   Esterwood, Amy S, PA-C  prochlorperazine (COMPAZINE) 5 MG tablet Take 1-2 tablets (5-10 mg total) by mouth every 8 (eight) hours as needed for nausea or vomiting. 02/28/17   Truitt Merle, MD     Vital Signs:Blood pressure 141/76, heart rate 86, respirations 18, O2 sat 100%, temperature 98.3.   Physical Exam awake, alert. Scleral icterus; Chest with slightly diminished breath sounds right base, left clear. Heart with regular rate and rhythm. Abdomen soft, positive bowel sounds, nontender. Lower extremities with 2-3+ edema bilaterally.  Imaging: No results found.  Labs:  CBC:  Recent Labs  01/03/17 1213 02/07/17 0955 02/11/17 1608 02/28/17 1132  WBC 3.5* 4.8 5.7 5.9  HGB 11.2* 10.9* 11.2* 11.1*  HCT 33.1* 31.7* 32.9* 32.8*  PLT 187 211 220.0 139*    COAGS:  Recent Labs  02/11/17 1608  INR 1.2*    BMP:  Recent Labs  01/03/17 1214 02/07/17 0954 02/11/17 1608 02/28/17  1132  NA 135* 134* 134* 137  K 4.0 3.9 3.9 3.6  CL  --   --  101  --   CO2 25 22 25 24   GLUCOSE 88 88 90 100  BUN 8.5 7.8 10 6.8*  CALCIUM 9.1 8.7 8.6 8.4  CREATININE 0.8 0.7 0.70 0.7    LIVER FUNCTION TESTS:  Recent Labs  01/03/17 1214 02/07/17 0954 02/07/17 0954 02/11/17 1608 02/28/17 1132  BILITOT 1.79* 5.93 Repeated and Verified* 5.4* 6.9* 12.74*  AST 120* 202  Repeated and Verified*  --  171* 209*  ALT 76* 109*  --  93* 112*  ALKPHOS 193* 387*  --  319* 379*  PROT 6.9 6.6  --  6.7 6.4  ALBUMIN 3.3* 3.0*  --  3.5 2.7*    Assessment and Plan: Pt with history of node-positive right breast adenocarcinoma diagnosed in 2009. She now has worsening transaminitis and hyperbilirubinemia with imaging revealing macronodular hepatic cirrhosis and extensive fibrosis although diffuse hepatic metastatic disease not completely excluded.She presents today for image guided liver biopsy for further evaluation. Risks and benefits discussed with the patient/daughter including, but not limited to bleeding, infection, damage to adjacent structures or low yield requiring additional tests.All of the patient's questions were answered, patient is agreeable to proceed.Consent signed and in chart.     Electronically Signed: D. Rowe Robert, PA-C 03/01/2017, 11:38 AM   I spent a total of 20 minutes at the the patient's bedside AND on the patient's hospital floor or unit, greater than 50% of which was counseling/coordinating care for image guided liver biopsy

## 2017-03-03 NOTE — Progress Notes (Signed)
[name]  [phone] Fax:(336) [phone]  Clinic follow up Note   Patient Care Team: [name], MD as PCP - Charissa Bash, MD as Consulting Physician (Hematology) Gardenia Phlegm, NP as Nurse Practitioner (Hematology and Oncology)  03/04/2017  CHIEF COMPLAINTS Follow up breast cancer     Breast cancer of lower-outer quadrant of right female breast ([place])   09/03/2008 Initial Diagnosis    right breast cancer, T2N1Mo, stage IIA, ER+, PR+, HER2+ (ration 2.07). Pt declined surgery or other treatment.        05/20/2011 Tumor Marker    right breast mass biopsy showed IDA, ER+/PR+/HER2- (ratio 1.37)      09/04/2011 Cancer Staging    PET scan showed no distant mets      10/08/2014 Progression     lumbar spine MRI showed multiple bone metastasis , highly suspicious for  metastatic disease.      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 Increased size of right pleural effusion, indeterminate for malignant involvement of the pleural fluids.      01/20/2016 Imaging    CT CAP with contrast showed small bilateral pleural effusion, right greater than left, similar osseous metastasis, no evidence of extraosseous metastasis. Bone scan showed no areas of suspicious uptake.      09/27/2016 Imaging    CT CAP w contrast 1. Heterogeneous perfusion of the liver identified on today's study with some areas of hypoattenuating nodularity. This raises concern for development of liver metastases although this is not a  definite finding today may simply reflect heterogeneous perfusion. MRI of the abdomen without and with contrast would prove helpful to further evaluate, as clinically warranted. 2. Imaging features suggest bony metastatic disease, stable in the interval. Given lack of uptake on bone scan performed earlier today, these bony changes likely reflect treated disease. 3. Persistent marked skin thickening and edema diffusely in the right breast tracking into the region of the right axilla. Not substantially changed in the interval. 4. Interval resolution of small bilateral pleural effusions with posterior right lower lobe collapse/consolidation.      10/10/2016 Imaging    MR Abdomen w/wo contrast 1. Unusual perfusion abnormality involving the liver could be related to prior radiation effect but I do not see any MR findings suspicious for hepatic metastatic disease. The portal and hepatic veins are patent. 2. Stable surgical and radiation changes involving the right breast. No significant change in 21.5 mm irregular enhancing right lateral breast lesion. 3. Diffuse osseous metastatic disease.      10/13/2016 - 10/22/2016 Radiation Therapy    SRS treatment 5 fractions to the left orbit bone metastasis with Dr. MTammi Klippel      02/01/2017 Imaging    CT CAP  IMPRESSION: Chest Impression: 1. No evidence of soft tissue metastasis in thorax. 2. Bilateral pleural effusions are increased. 3. Focus of round atelectasis RIGHT lung base.  Abdomen / Pelvis Impression: 1. No evidence of metastasis in soft tissue. 2. Nodular liver consistent with cirrhosis. 3. Diffuse sclerotic skeletal  metastasis.      02/02/2017 Imaging    Bone Scan IMPRESSION: 1. Several vague areas of increased activity in the ribs primarily the left anterior 6th or seventh rib but no CT correlate is seen to indicate definite metastatic lesion. Early metastatic lesions cannot be excluded. 2. Increased activity along the right  parietal calvarium could indicate metastatic involvement. 3. A focus of sclerosis within the right iliac bone by yesterday's CT does not demonstrate increased activity on bone scan.      02/18/2017 Imaging    MRI Abdomen 02/18/17 IMPRESSION: 1. Diffuse osseous metastatic disease, similar prior exams. 2. Diffuse abnormal signal throughout the liver probably representing a combination of macronodular hepatic cirrhosis with extensive fibrosis. The fibrotic portions are interspersed but are more prominent peripherally in the liver. No early arterial phase masslike enhancement to suggest hepatocellular carcinoma. Occasionally diffuse hepatic metastatic disease can cause a similar complex pattern, although cirrhosis is favored in this case. Sensitivity for smaller metastatic lesions to the liver is reduced due to the severity of the underlying global signal abnormality in the liver. 3. Considerable gallbladder wall thickening, although this may well be due to hypoproteinemia/hypoalbuminemia rather than necessarily being from inflammation. There is third spacing of fluid in the subcutaneous and mesenteric tissues which also suggests third spacing of fluid. 4. No biliary dilatation. 5. Chronic appearance of round atelectasis in the right lower lobe, potentially with tethering to a focus of pleural thickening and mild pleural enhancement as has been shown on multiple prior exams. Moderate to large right and moderate left pleural effusion.      02/23/2017 PET scan    PET 02/23/17 IMPRESSION: 1. Similar appearance of abnormal asymmetric skin thickening and soft tissue infiltration involving the right breast compatible with known multifocal breast cancer. Some of these changes may reflect treatment related changes. 2. No hypermetabolic soft tissue mass or adenopathy identified. 3. There is a focal area of abnormal asymmetric increased uptake localizing to the anterior aspect of the left sixth  rib. If there is a recent history of trauma to this area findings may reflect posttraumatic uptake. Metabolically active focal bone metastasis not excluded. Clinical correlation advise. 4. Persistent bilateral pleural effusions. 5. Aortic atherosclerosis        02/28/2017 Tumor Marker    ca27.29 is 806.0       03/01/2017 Imaging    [place] of liver  03/01/2017 IMPRESSION: No discrete lesions identified within the liver by ultrasound.      03/01/2017 Pathology Results    Hassan Rowan biopsy of the right lobe liver showed metastatic carcinoma, immunohistochemistry staining are positive for ER and GATA-3 with patchy week cytokeratin 7 positivity. CK 20 and GCDFP are negative. The morphology and immunophenotype are consistent with metastatic breast cancer.        HISTORY OF PRESENTING ILLNESS (10/30/2014):  Margaret Rubio 76 y.o. female  Was referred by her primary care physician to discuss the management probable metastatic breast cancer.  She was initially diagnosed with right breast cancer in November 2009. 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 In December of 2009, she underwent a right axillary lymph node biopsy on 09/24/2008 which showed metastatic ductal carcinoma. (Case No: [id number]) The tumor was ER 100% positive, PR 0% negative, Ki67 - 22%. 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 The ratio of HER-2:CEP 17 was 2.07.  She decided to pursue nature therapy by  diet and exercise, and did not have surgery and other tranditional therapy for her breast cancer.   In May 2011 she underwent a mammogram that showed a 3.8 cm invasive duct carcinoma in the subareolar region of the right breast which had increased since November 2009.  The tumor was ER  99% positive,  PR 4% positive, HER-2 negative ( ratio 1.37).  She was seen by Dr. Estella Husk and Dr. Lucia Gaskins.  However, she failed multiple  appointments and sought alternative medical care in [place]. This involved putting something on her right breast that caused some scarring.  She noticed the enlarged right breast mass and nipple inversion about one year ago. No pain, but some skin change. She developed left low back/buttock and hip and  pain in Nov 2015, was seen by PCP Dr. Jenny Reichmann who prescribed vicodin and tramadol and pain improved with meds. Due to the persistent pain, she underwent a lumber MRI  On 10/08/2014, which showed multiple bone metastasis.  She run out pain medication about a week ago,  But did not call for refill. She states her pain is not bad , but she appears to be uncomfortable when she stands up and walks.  She otherwise feels well overall,  Has good appetite and energy level. She lives alone and still functions well.  She comes in with her niece today.   Current therapy:  1.Anastrozole 1 mg once daily started on 12/10/2014 2. Ibrance added on 07/29/15, stopped in Nov 2016, restarted on 01/10/2016, held during her radiation in 09/2016  3. Xgeva started on 08/15/2015, changed to every 3 months after 09/29/2016  --All held due to liver fialure since 02/08/2017   INTERIM HISTORY:  Ms. Dooner returns for follow-up and discuss biopsy results. She has been doing well since biopsy. She still has intermittent nausea, appetite is low, but eats small meals and snacks. No pain or other new complains. She is able to take care of her self. Her daughter later joined visit.   MEDICAL HISTORY:  Past Medical History:  Diagnosis Date  . 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 ASTHMA 01/31/2008  . Breast cancer [facility]) dx 2009   right breast  . BREAST CANCER, HX OF 11/14/2009  . BRONCHITIS, ACUTE 01/31/2008  . Cough 10/17/2009  . Dyspnea    increased exertion   . FATIGUE 11/14/2009  . GANGLION CYST 05/01/2009  . History of blood transfusion   . HYPERLIPIDEMIA 05/21/2007  . HYPERTENSION 05/05/2007  . LOW BACK PAIN 05/21/2007  .  MENOPAUSAL DISORDER 05/22/2010  . Pain in joint, lower leg 05/10/2008  . RASH-NONVESICULAR 05/01/2009  . SHOULDER PAIN, LEFT 11/14/2009  . 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 VITAMIN B12 DEFICIENCY 05/23/2010    SURGICAL HISTORY: Past Surgical History:  Procedure Laterality Date  . OTHER SURGICAL HISTORY     fibroid removal  . removal of neck lump     benigh 1986    Social History   Social History  . Marital status: Widowed    Spouse name: N/A  . Number of children: 1  . Years of education: N/A   Social History Main Topics  . Smoking status: Never Smoker  . Smokeless tobacco: Never Used  . Alcohol use No  . Drug use: No  . Sexual activity: No   Other Topics Concern  . None   Social History Narrative  . None    FAMILY HISTORY: Family History  Problem Relation Age of Onset  . Hypertension Father   . Heart  disease Father   . Asthma Other   . Diabetes Other   . Hypertension Other   . Cancer Paternal Aunt        leukemia ?  Marland Kitchen Colon cancer Neg Hx   . Esophageal cancer Neg Hx   . Pancreatic cancer Neg Hx   . Stomach cancer Neg Hx   . Liver disease Neg Hx   Paternal aunt had cancer, unknown type. No other family history of malignancy   ALLERGIES:  is allergic to asa [aspirin] and tomato.  MEDICATIONS:  Current Outpatient Prescriptions on File Prior to Visit  Medication Sig Dispense Refill  . Ascorbic Acid (VITAMIN C) 100 MG CHEW Chew 1 tablet (100 mg total) by mouth every morning. 90 each 3  . calcium carbonate 1250 MG capsule Take 1,250 mg by mouth 2 (two) times daily with a meal. Not sure of type or dose    . cholecalciferol (VITAMIN D) 1000 units tablet Take 1,000 Units by mouth daily. Patient not sure of dose    . clotrimazole (GYNE-LOTRIMIN 3) 2 % vaginal cream Apply to affected bilaterall axilla sites as needed for rash. 21 g 1  . Cyanocobalamin (B-12 COMPLIANCE INJECTION IJ) Inject as directed every 30 (thirty) days.    . Nystatin POWD 1 apply to right axilla  twice daily until rashes resolves 1 Bottle 1  . ondansetron (ZOFRAN) 4 MG tablet Take 1 tablet (4 mg total) by mouth every 6 (six) hours. 50 tablet 2  . prochlorperazine (COMPAZINE) 5 MG tablet Take 1-2 tablets (5-10 mg total) by mouth every 8 (eight) hours as needed for nausea or vomiting. 30 tablet 0   No current facility-administered medications on file prior to visit.   ; REVIEW OF SYSTEMS:  Constitutional: Denies fevers, chills or abnormal night sweats,  Eyes: Denies blurriness of vision, double vision or watery eyes (+) jaundice in eyes due to liver failure Ears, nose, mouth, throat, and face: Denies mucositis or sore throat,  Respiratory: Denies dyspnea or wheezes  Cardiovascular: Denies palpitation, chest discomfort or lower extremity swelling Gastrointestinal:  Denies heartburn or change in bowel habits  (+) dark urine due to Liver failure  Skin: Denies abnormal skin rashes  Lymphatics: Denies new lymphadenopathy or easy bruising Neurological:Denies numbness, tingling or new weaknesses Behavioral/Psych: Mood is stable, no new changes  All other systems were reviewed with the patient and are negative.  PHYSICAL EXAMINATION:  ECOG PERFORMANCE STATUS: 1 BP 129/70 (BP Location: Left Arm, Patient Position: Sitting)   Temp 98.2 F (36.8 C) (Oral)   Resp 17   Ht 5' 5" (1.651 m)   Wt 182 lb 11.2 oz (82.9 kg)   SpO2 100%   BMI 30.40 kg/m   GENERAL:alert, no distress and comfortable SKIN: texture, turgor are normal, no significant lesions, (+) skin color jundice EYES: normal, conjunctiva are pink and non-injected (+) has juandice in eyes  OROPHARYNX:no exudate, no erythema and lips, buccal mucosa, and tongue normal  NECK: supple, thyroid normal size, non-tender, without nodularity LYMPH:  no palpable lymphadenopathy in the cervical, axillary or inguinal LUNGS: clear to auscultation and percussion with normal breathing effort, slightly decreased breath sounds at the bottom of right  lung. HEART: regular rate & rhythm and no murmurs and no lower extremity edema ABDOMEN:abdomen soft, non-tender and normal bowel sounds  Musculoskeletal:no cyanosis of digits and no clubbing, (+) mild edema on both legs and both feet, up to knee, L>R, could be due to liver failure PSYCH: alert & oriented  x 3 with fluent speech NEURO: no focal motor/sensory deficits. Her left eye ball has limited range of movement  Breasts: exam deferred     LABORATORY DATA:  I have reviewed the data as listed CBC Latest Ref Rng & Units 03/01/2017 02/28/2017 02/11/2017  WBC 4.0 - 10.5 K/uL 5.0 5.9 5.7  Hemoglobin 12.0 - 15.0 g/dL 10.3(L) 11.1(L) 11.2(L)  Hematocrit 36.0 - 46.0 % 29.3(L) 32.8(L) 32.9(L)  Platelets 150 - 400 K/uL 142(L) 139(L) 220.0    CMP Latest Ref Rng & Units 02/28/2017 02/11/2017 02/07/2017  Glucose 70 - 140 mg/dl 100 90 88  BUN 7.0 - 26.0 mg/dL 6.8(L) 10 7.8  Creatinine 0.6 - 1.1 mg/dL 0.7 0.70 0.7  Sodium 136 - 145 mEq/L 137 134(L) 134(L)  Potassium 3.5 - 5.1 mEq/L 3.6 3.9 3.9  Chloride 96 - 112 mEq/L - 101 -  CO2 22 - 29 mEq/L _0 Calcium 8.4 - 10.4 mg/dL 8.4 8.6 8.7  Total Protein 6.4 - 8.3 g/dL 6.4 6.7 6.6  Total Bilirubin 0.20 - 1.20 mg/dL 12.74(HH) 6.9(H) 5.4(H)  Alkaline Phos 40 - 150 U/L 379(H) 319(H) 387(H)  AST 5 - 34 U/L 209(HH) 171(H) 202 Repeated and Verified(HH)  ALT 0 - 55 U/L 112(H) 93(H) 109(H)   CA27.29: 06/30/2012: 28 10/30/2014: 355 08/15/2015: 424 12/27/2014: 414 05/05/2016: 416.1 06/10/2016: 332.4 07/01/2016: 348.8 08/03/2016: 388.1 09/01/2016: 321.1 10/26/16: 404.3 11/30/16: 403.7 01/03/17: 815.9 02/07/17: 857.9 02/28/2017: 806.0    PATHOLOGY REPORT  Surgical pathology 03/01/2017 Diagnosis Liver, needle/core biopsy, right lobe - METASTATIC CARCINOMA - SEE COMMENT Microscopic Comment The liver biopsies have an infiltrative cellular process arranged predominantly in small nests. The patient's history of invasive ductal carcinoma is noted. By  immunohistochemistry, the neoplastic cells are positive for ER and GATA-3 with patchy weak cytokeratin 7 positivity. Cytokeratin 20 and GCDFP are negative. The morphology and immunophenotype are consistent with metastatic breast carcinoma.   RIGHT BREAST MASS, 6 O'CLOCK, NEEDLE CORE BIOPSIES: 09/03/2008 MICROSCOPIC FOCUS OF DUCTAL CARCINOMA IN SITU ASSOCIATED WITH MICROSCOPIC FOCUS OF MICROINVASION. SEE COMMET.  LYMPH NODE, RIGHT AXILLARY, NEEDLE CORE BIOPSIES: METASTATIC DUCTAL CARCINOMA.  09/24/2008  Diagnosis 05/20/2011 Breast, right, needle core biopsy - INVASIVE DUCTAL CARCINOMA, SEE COMMENT. - LYMPHOVASCULAR INVASION PRESENT. Estrogen Receptor (Negative, <1%): 99%, STRONG STAINING INTENSITY Progesterone Receptor (Negative, <1%): 4%, STRONG HER-2/NEU BY CISH - NO AMPLIFICATION OF HER-2 DETECTED. THE RATIO OF HER-2: CEP 17 SIGNALS WAS 1.38.  Breast, right, needle core biopsy, 6:30 o'clock 12/13/2014 - INVASIVE MAMMARY CARCINOMA. - SEE MICROSCOPIC DESCRIPTION.  Diagnosis 08/01/2015 PLEURAL FLUID, RIGHT(SPECIMEN 1 OF 1 COLLECTED 08/01/15): METASTATIC CARCINOMA CONSISTENT WITH BREAST PRIMARY.   RADIOGRAPHIC STUDIES: I have personally reviewed the radiological images as listed and agreed with the findings in the report.  PET 07/17/2015 IMPRESSION: 1. Suspected active diverticulitis along the sigmoid colon, with focal mesenteric stranding and a small locule of gas which is either within an inflamed diverticulum or a micro perforation. No overt free air. 2. Overall similar appearance of multifocal right breast cancer with extensive osseous metastatic disease. The right axillary lymph nodes are less notable that there is a borderline hypermetabolic left axillary lymph node currently. 3. Increasing size of the right pleural effusion, now moderate. Appearance indeterminate for malignant involvement of the pleural fluid. 4. Faintly increased activity in a right infrahilar lymph  node which could represent early metastatic spread. 5. Other imaging findings of potential clinical significance: Chronic left maxillary sinusitis. Low-density blood pool suggests anemia. Left adrenal adenoma. These results will  be called to the ordering clinician or representative by the Radiologist Assistant, and communication documented in the PACS or zVision Dashboard.  Brain MRI with and without contrast on 06/27/2015, at [facility] -In determinate, ill-defined soft tissue infiltration in the left orbit with questionable enhancement, further evaluation with CT orbits with contrast is recommended to the cyst in ruling out metastasis. -No intracranial metastasis -Diffusely hypoechoic intense bone marrow signal throughout the skull base   Bone scan 05/31/2016 IMPRESSION: New increased up take in the ribs bilaterally worrisome for metastatic disease. Stable increased uptake in the right fronto poor parietal bone.  A bilateral rib series is recommended.   CT chest, abdomen and pelvis with contrast 05/31/2016 IMPRESSION: 1. Similar fit appearance of widespread it treated osseous metastatic disease, no progression. No findings of new extra osseous metastatic disease. 2. Continued skin thickening and edema in the right breast. The edema in the right breast and tracking in the right chest wall is slightly increased compared to prior and the lateral soft tissue density in the right lateral breast is stable. 3. Similar small bilateral pleural effusions with some atelectasis at the right lung base possibly tethered to the parietal pleura. 4. Mild coronary artery atherosclerosis and atherosclerotic calcification of the aortic arch. 5. Airway thickening is present, suggesting bronchitis or reactive airways disease.  MR orbits w/wo contrast 07/22/2016  IMPRESSION: 1. Diffuse metastasis within the left orbit intraconal space, which likely  invades the left extraocular muscles. No abnormal enhancement of the left optic nerve to strongly indicate nerve invasion although there does appear to be abnormal T2 signal within the substance of the L CN2, such as due to neuritis. The left globe appears spared. 2. No other soft tissue metastasis identified, an the right orbit appears normal. Evidence of osseous metastatic disease in the cervical spine and possibly also the calvarium.  CT CAP W Contrast 09/28/16 IMPRESSION: 1. Heterogeneous perfusion of the liver identified on today's study with some areas of hypoattenuating nodularity. This raises concern for development of liver metastases although this is not a definite finding today may simply reflect heterogeneous perfusion. MRI of the abdomen without and with contrast would prove helpful to further evaluate, as clinically warranted. 2. Imaging features suggest bony metastatic disease, stable in the interval. Given lack of uptake on bone scan performed earlier today, these bony changes likely reflect treated disease. 3. Persistent marked skin thickening and edema diffusely in the right breast tracking into the region of the right axilla. Not substantially changed in the interval. 4. Interval resolution of small bilateral pleural effusions with posterior right lower lobe collapse/consolidation.   MR Abdomen w/wo contrast 10/10/2016 IMPRESSION: 1. Unusual perfusion abnormality involving the liver could be related to prior radiation effect but I do not see any MR findings suspicious for hepatic metastatic disease. The portal and hepatic veins are patent. 2. Stable surgical and radiation changes involving the right breast. No significant change in 21.5 mm irregular enhancing right lateral breast lesion. 3. Diffuse osseous metastatic disease.  CT CAP 02/01/17 IMPRESSION: Chest Impression: 1. No evidence of soft tissue metastasis in thorax. 2. Bilateral pleural effusions  are increased. 3. Focus of round atelectasis RIGHT lung base.  Abdomen / Pelvis Impression: 1. No evidence of metastasis in soft tissue. 2. Nodular liver consistent with cirrhosis. 3. Diffuse sclerotic skeletal metastasis.  Bone Scan 02/02/17 IMPRESSION: 1. Several vague areas of increased activity in the ribs primarily the left anterior 6th or seventh rib but  no CT correlate is seen to indicate definite metastatic lesion. Early metastatic lesions cannot be excluded. 2. Increased activity along the right parietal calvarium could indicate metastatic involvement. 3. A focus of sclerosis within the right iliac bone by yesterday's CT does not demonstrate increased activity on bone scan.  US Abdomen 02/08/17 IMPRESSION: 1. Nodular liver as seen on the recent CT Abdomen and Pelvis suspicious for cirrhosis. This is felt to explain the circumferential gallbladder wall thickening on the basis of hepatocellular inflammation. No cholelithiasis or strong evidence of acute cholecystitis. 2. No evidence of biliary obstruction. 3. Right pleural effusion re- identified.  MRI Abdomen 02/18/17 IMPRESSION: 1. Diffuse osseous metastatic disease, similar prior exams. 2. Diffuse abnormal signal throughout the liver probably representing a combination of macronodular hepatic cirrhosis with extensive fibrosis. The fibrotic portions are interspersed but are more prominent peripherally in the liver. No early arterial phase masslike enhancement to suggest hepatocellular carcinoma. Occasionally diffuse hepatic metastatic disease can cause a similar complex pattern, although cirrhosis is favored in this case. Sensitivity for smaller metastatic lesions to the liver is reduced due to the severity of the underlying global signal abnormality in the liver. 3. Considerable gallbladder wall thickening, although this may well be due to hypoproteinemia/hypoalbuminemia rather than necessarily being from inflammation.  There is third spacing of fluid in the subcutaneous and mesenteric tissues which also suggests third spacing of fluid. 4. No biliary dilatation. 5. Chronic appearance of round atelectasis in the right lower lobe, potentially with tethering to a focus of pleural thickening and mild pleural enhancement as has been shown on multiple prior exams. Moderate to large right and moderate left pleural effusion.   PET 02/23/17 IMPRESSION: 1. Similar appearance of abnormal asymmetric skin thickening and soft tissue infiltration involving the right breast compatible with known multifocal breast cancer. Some of these changes may reflect treatment related changes. 2. No hypermetabolic soft tissue mass or adenopathy identified. 3. There is a focal area of abnormal asymmetric increased uptake localizing to the anterior aspect of the left sixth rib. If there is a recent history of trauma to this area findings may reflect posttraumatic uptake. Metabolically active focal bone metastasis not excluded. Clinical correlation advise. 4. Persistent bilateral pleural effusions. 5. Aortic atherosclerosis  US Biopsy 03/01/2017 IMPRESSION: No discrete lesions identified within the liver by ultrasound.  ASSESSMENT & PLAN:   Mrs. Yaney is a 76 y.o. African-American female, who was diagnosed with node positive right breast  Invasive adenocarcinoma in  2009, untreated,  Now presents with low back pain and lumbar spine MRI is highly suspicious for bone metastasis.  1. Worsening transaminitis and hyperbilirubinemia, and acute liver failure  -She started having mild elevated liver enzymes about 4-5 months before, much worse lately -CT scan on 09/27/16 showed heterogeneous perfusion in the liver. -Liver MRI on 10/10/16 showed unusual perfusion abnormality involving the liver, but no definite evidence of metastasis. -repeated staging CT scan on 02/01/2017 did not show evidence of liver metastasis, but nodular liver  consistent with liver cirrhosis, she has no history of liver disease in the past, hepatitis B and C were negative, she does not drink alcohol much. -Ultrasound abdomen this morning showed nodular liver suspicious for liver cirrhosis. No bili obstruction.  -I previously referred her to GI, abd MRI was done which showed liver cirrhosis, no image evidence of liver metastasis -I previously discussed her restaging PET scan from 02/23/2017, which showed hypermetabolic right breast cancer, otherwise negative, no hypermetabolic lesions in the liver -I have held  anastrozole and Ibrance since early May however her liver function continues getting worse,  -Given her underlying metastatic breast cancer, infiltrative liver metastasis is likely,  I strongly recommend patient to have liver biopsy.  - I reviewed the liver biopsy results with patient and her daughter, it confirms metastatic cancer from breast.  2. 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 -she knows that her disease at this stage is incurable. The goal of treatment is palliative and to prolong her life. -she is on first line anastrozole, Leslee Home was added on in October 2016 due to slight disease progression. She was not very compliant, off Ibrance for 3-4 months and restarted in 01/2016. -I previously discussed her restaging CT and bone scan from 05/31/2016, which showed diffuse similar appearing bone lesions, no other new metastasis. Bone scan showed mild bilateral rib uptake, but no corresponding findings on the CT. I think she has  stable disease overall. -I previously reviewed her restaging CT scan and bone scan from 09/27/2016, which showed stable bone mets. CT scan showed heterogeneous perfusion of the liver indeterminate -I previously reviewed MR abdomen scan from 10/10/2016 with the patient, which showed no evidence of metastatic disease in liver  -She has developed worsening liver function, especially upper bilirubinemia, highly suspicious for diffuse liver metastasis, although this no evidence on the recent MRI or PET -Liver biopsy confirmed diffuse metastasis from breast cancer -Due to the liver failure, especially hyperbilirubinemia, her option of treatment is very limited. Due to her rapid liver failure from cancer metastasis, I recommend her to consider chemotherapy, with carboplatin and gemcitabine. Both of these chemotherapy drugs are metabolized in kidney, less impact on her liver. She is not a candidate for taxing him a Adriamycin, and many other chemotherapy therapy due to her severe liver impairment.  -We discussed alternative palliative care alone approach. Due to her severe liver failure, her overall prognosis is extremely poor, her life expectancy is likely a few months if no treatment.  -Patient does not want hospice at this point. After lengthy discussion, she agrees to try chemotherapy. --Chemotherapy consent: Side effects including but does not not limited to, fatigue, nausea, vomiting, diarrhea, hair loss, neuropathy, fluid retention, renal and kidney dysfunction, neutropenic fever, needed for blood transfusion, bleeding, were discussed with patient in great detail. She agrees to proceed. -The goal of therapy is palliative  -will start next chemo next Tuesday.  -Patient does not want a port at this point.  3. Left orbit bone metastasis  -this was found on her brain MRI in 06/2015 when she had heaache, she has issues with left eye ball movement, was evaluated by Dr. Emilio Math at [facility] -this is likely a  metastatic lesion from her breast cancer -I previously discussed her orbital MRI scan findings, which showed diffuse metastases to status is within the left orbital with invading the left extraocular muscles. No optical nerve involvement. -She previously completed treatment with radiation oncologist Dr. Tammi Klippel, who delivered 5 fractions of SRS to her left orbit bone metastasis   4. Low back/ hip pain, Secondary to metastatic cancer to bones  -  this has resolved since she started anastrozole -continue Xgeva every 3 months, will monitor  5. Anemia -Likely secondary to her breast cancer, bone metastases and B12 deficiency -Slightly improved after B12 injections. Continue B12 monthly for now. -No need for transfusion. We'll follow up closely. -The patient was previously neutropenic because she took 2 extra doses of Ibrance.   6. Goal of care discussion  -We again discussed the incurable nature of her cancer, and the overall poor prognosis, especially she has developed liver failure from diffuse liver metastasis from her breast cancer. The treatment option is very limited. -The patient understands the goal of care is palliative. -I recommend do not resuscitation. She has agreed DNR/DNI. Her daughter is supportive to her decision.   7. Nausea and fatigue -she will use compazine as needed for nausea  -I also previously encourage her to try Maalox for her gastric discomfort -Refill compazine 1-2 tablets  every 8 hours  8. LE edema -She has previously developed left foot pain and swollen, on exam she has bilateral lower extremity edema, mild, more prominent in the left -Her previously  x-ray was negative -I previously obtain a bilateral lower extremity Doppler to ruled out DVT which was negative on 01/07/2017   Plan:  -  Lab and chemo carboplatin AUC 4 (may increase to 5 from cycle 2 if tolerates well) one day 1 and gemcitabine 839m/m2 on day 1, 8 eery 21 days, starting on 5/29 -F/u the  second dose of chemo  -Pt agrees with DNR  -I spoke with her two daughters again after her viist and discussed the above again with them  All questions were answered. The patient knows to call the clinic with any problems, questions or concerns.  I spent 30 minutes counseling the patient face to face. The total time spent in the appointment was 40 minutes and more than 50% was on counseling.  This document serves as a record of services personally performed by YTruitt Merle MD. It was created on her behalf by TBrandt Loosen a trained medical scribe. The creation of this record is based on the scribe's personal observations and the provider's statements to them. This document has been checked and approved by the attending provider.   FTruitt Merle MD 03/04/2017

## 2017-03-04 ENCOUNTER — Telehealth: Payer: Self-pay | Admitting: *Deleted

## 2017-03-04 ENCOUNTER — Ambulatory Visit (HOSPITAL_BASED_OUTPATIENT_CLINIC_OR_DEPARTMENT_OTHER): Payer: Medicare HMO | Admitting: Hematology

## 2017-03-04 ENCOUNTER — Encounter: Payer: Self-pay | Admitting: Hematology

## 2017-03-04 VITALS — BP 129/70 | Temp 98.2°F | Resp 17 | Ht 65.0 in | Wt 182.7 lb

## 2017-03-04 DIAGNOSIS — C787 Secondary malignant neoplasm of liver and intrahepatic bile duct: Secondary | ICD-10-CM | POA: Diagnosis not present

## 2017-03-04 DIAGNOSIS — K7200 Acute and subacute hepatic failure without coma: Secondary | ICD-10-CM

## 2017-03-04 DIAGNOSIS — R740 Nonspecific elevation of levels of transaminase and lactic acid dehydrogenase [LDH]: Secondary | ICD-10-CM | POA: Diagnosis not present

## 2017-03-04 DIAGNOSIS — Z170 Estrogen receptor positive status [ER+]: Secondary | ICD-10-CM | POA: Diagnosis not present

## 2017-03-04 DIAGNOSIS — I10 Essential (primary) hypertension: Secondary | ICD-10-CM

## 2017-03-04 DIAGNOSIS — C7951 Secondary malignant neoplasm of bone: Secondary | ICD-10-CM

## 2017-03-04 DIAGNOSIS — D649 Anemia, unspecified: Secondary | ICD-10-CM

## 2017-03-04 DIAGNOSIS — C50511 Malignant neoplasm of lower-outer quadrant of right female breast: Secondary | ICD-10-CM

## 2017-03-04 DIAGNOSIS — Z7189 Other specified counseling: Secondary | ICD-10-CM

## 2017-03-04 DIAGNOSIS — D630 Anemia in neoplastic disease: Secondary | ICD-10-CM

## 2017-03-04 MED FILL — ONDANSETRON HCL 4 MG TABLET: 4 | 15 days supply | Qty: 50 | Fill #1

## 2017-03-04 NOTE — Telephone Encounter (Signed)
Called pt & informed of appts including Chemotherapy class for tues.  Her daughter Margaret Rubio had several questions which I answered to the best of my ability but she would still like Dr Burr Medico to call her back.  Ph # is [phone].  Message left for Dr Burr Medico.

## 2017-03-04 NOTE — Progress Notes (Signed)
START OFF PATHWAY REGIMEN - Breast   [id number]:Carboplatin + Gemcitabine (5.02/999):   A cycle is every 21 days:     Carboplatin      Gemcitabine   **Always confirm dose/schedule in your pharmacy ordering system**    Patient Characteristics: Metastatic Chemotherapy, HER2 Negative/Unknown/Equivocal, ER -Verita Schneiders, First Line Therapeutic Status: Distant Metastases BRCA Mutation Status: Awaiting Test Results ER Status: Negative (-) HER2 Status: Negative (-) Would you be surprised if this patient died  in the next year? I would NOT be surprised if this patient died in the next year PR Status: Positive (+) Line of therapy: First Line  Intent of Therapy: Non-Curative / Palliative Intent, Discussed with Patient

## 2017-03-05 DIAGNOSIS — K7290 Hepatic failure, unspecified without coma: Secondary | ICD-10-CM | POA: Insufficient documentation

## 2017-03-08 ENCOUNTER — Encounter: Payer: Self-pay | Admitting: *Deleted

## 2017-03-08 ENCOUNTER — Ambulatory Visit (HOSPITAL_BASED_OUTPATIENT_CLINIC_OR_DEPARTMENT_OTHER): Payer: Medicare HMO

## 2017-03-08 ENCOUNTER — Other Ambulatory Visit: Payer: Medicare HMO

## 2017-03-08 ENCOUNTER — Other Ambulatory Visit: Payer: Self-pay | Admitting: Hematology

## 2017-03-08 ENCOUNTER — Other Ambulatory Visit (HOSPITAL_BASED_OUTPATIENT_CLINIC_OR_DEPARTMENT_OTHER): Payer: Medicare HMO

## 2017-03-08 VITALS — BP 123/70 | HR 80 | Temp 98.1°F | Resp 16

## 2017-03-08 DIAGNOSIS — Z5111 Encounter for antineoplastic chemotherapy: Secondary | ICD-10-CM

## 2017-03-08 DIAGNOSIS — Z170 Estrogen receptor positive status [ER+]: Principal | ICD-10-CM

## 2017-03-08 DIAGNOSIS — C50511 Malignant neoplasm of lower-outer quadrant of right female breast: Secondary | ICD-10-CM

## 2017-03-08 DIAGNOSIS — C519 Malignant neoplasm of vulva, unspecified: Secondary | ICD-10-CM | POA: Diagnosis not present

## 2017-03-08 DIAGNOSIS — D519 Vitamin B12 deficiency anemia, unspecified: Secondary | ICD-10-CM | POA: Diagnosis not present

## 2017-03-08 DIAGNOSIS — E538 Deficiency of other specified B group vitamins: Secondary | ICD-10-CM

## 2017-03-08 LAB — CBC WITH DIFFERENTIAL/PLATELET
BASO%: 0.3 % (ref 0.0–2.0)
Basophils Absolute: 0 10*3/uL (ref 0.0–0.1)
EOS ABS: 0.1 10*3/uL (ref 0.0–0.5)
EOS%: 1 % (ref 0.0–7.0)
HCT: 31.3 % — ABNORMAL LOW (ref 34.8–46.6)
HEMOGLOBIN: 10.7 g/dL — AB (ref 11.6–15.9)
LYMPH#: 1.1 10*3/uL (ref 0.9–3.3)
LYMPH%: 19.7 % (ref 14.0–49.7)
MCH: 30.4 pg (ref 25.1–34.0)
MCHC: 34.2 g/dL (ref 31.5–36.0)
MCV: 88.9 fL (ref 79.5–101.0)
MONO#: 0.5 10*3/uL (ref 0.1–0.9)
MONO%: 8.1 % (ref 0.0–14.0)
NEUT#: 4.1 10*3/uL (ref 1.5–6.5)
NEUT%: 70.9 % (ref 38.4–76.8)
Platelets: 159 10*3/uL (ref 145–400)
RBC: 3.52 10*6/uL — ABNORMAL LOW (ref 3.70–5.45)
RDW: 15.8 % — AB (ref 11.2–14.5)
WBC: 5.8 10*3/uL (ref 3.9–10.3)

## 2017-03-08 LAB — COMPREHENSIVE METABOLIC PANEL
ALBUMIN: 2.4 g/dL — AB (ref 3.5–5.0)
ALK PHOS: 357 U/L — AB (ref 40–150)
ALT: 100 U/L — ABNORMAL HIGH (ref 0–55)
AST: 214 U/L — AB (ref 5–34)
Anion Gap: 11 mEq/L (ref 3–11)
BILIRUBIN TOTAL: 14.61 mg/dL — AB (ref 0.20–1.20)
BUN: 8 mg/dL (ref 7.0–26.0)
CO2: 21 mEq/L — ABNORMAL LOW (ref 22–29)
CREATININE: 0.7 mg/dL (ref 0.6–1.1)
Calcium: 8.3 mg/dL — ABNORMAL LOW (ref 8.4–10.4)
Chloride: 101 mEq/L (ref 98–109)
EGFR: >90 mL/min/{1.73_m2} (ref >90)
GLUCOSE: 99 mg/dL (ref 70–140)
Potassium: 3.7 mEq/L (ref 3.5–5.1)
Sodium: 133 mEq/L — ABNORMAL LOW (ref 136–145)
Total Protein: 5.9 g/dL — ABNORMAL LOW (ref 6.4–8.3)

## 2017-03-08 MED ORDER — PALONOSETRON HCL INJECTION 0.25 MG/5ML
INTRAVENOUS | Status: AC
  Filled 2017-03-08: qty 5

## 2017-03-08 MED ORDER — CYANOCOBALAMIN 1000 MCG/ML IJ SOLN
INTRAMUSCULAR | Status: AC
  Filled 2017-03-08: qty 1

## 2017-03-08 MED ORDER — SODIUM CHLORIDE 0.9 % IV SOLN: 10.0000 mg | Freq: Once | INTRAVENOUS | Status: DC

## 2017-03-08 MED ORDER — CYANOCOBALAMIN 1000 MCG/ML IJ SOLN
1000.0000 ug | Freq: Once | INTRAMUSCULAR | Status: AC
  Administered 2017-03-08: 1000 ug via INTRAMUSCULAR

## 2017-03-08 MED ORDER — DEXAMETHASONE SODIUM PHOSPHATE 10 MG/ML IJ SOLN
INTRAMUSCULAR | Status: AC
  Filled 2017-03-08: qty 1

## 2017-03-08 MED ORDER — SODIUM CHLORIDE 0.9 % IV SOLN
Freq: Once | INTRAVENOUS | Status: AC
  Administered 2017-03-08: 09:00:00 via INTRAVENOUS

## 2017-03-08 MED ORDER — PALONOSETRON HCL INJECTION 0.25 MG/5ML
0.2500 mg | Freq: Once | INTRAVENOUS | Status: AC
  Administered 2017-03-08: 0.25 mg via INTRAVENOUS

## 2017-03-08 MED ORDER — SODIUM CHLORIDE 0.9 % IV SOLN
800.0000 mg/m2 | Freq: Once | INTRAVENOUS | Status: AC
  Administered 2017-03-08: 1558 mg via INTRAVENOUS
  Filled 2017-03-08: qty 40.98

## 2017-03-08 MED ORDER — DEXAMETHASONE SODIUM PHOSPHATE 10 MG/ML IJ SOLN
10.0000 mg | Freq: Once | INTRAMUSCULAR | Status: AC
  Administered 2017-03-08: 10 mg via INTRAVENOUS

## 2017-03-08 MED ORDER — SODIUM CHLORIDE 0.9 % IV SOLN
350.4000 mg | Freq: Once | INTRAVENOUS | Status: AC
  Administered 2017-03-08: 350 mg via INTRAVENOUS
  Filled 2017-03-08: qty 35

## 2017-03-08 NOTE — Progress Notes (Signed)
Per MD Waldon Reining to treat with AST, ALT, and Bilirubin

## 2017-03-08 NOTE — Patient Instructions (Signed)
[facility] Discharge Instructions for Patients Receiving Chemotherapy  Today you received the following chemotherapy agents Gemzar and Carboplatin   To help prevent nausea and vomiting after your treatment, we encourage you to take your nausea medication as directed. No Zofran for 3 days. Take Compazine instead.    If you develop nausea and vomiting that is not controlled by your nausea medication, call the clinic.   BELOW ARE SYMPTOMS THAT SHOULD BE REPORTED IMMEDIATELY:  *FEVER GREATER THAN 100.5 F  *CHILLS WITH OR WITHOUT FEVER  NAUSEA AND VOMITING THAT IS NOT CONTROLLED WITH YOUR NAUSEA MEDICATION  *UNUSUAL SHORTNESS OF BREATH  *UNUSUAL BRUISING OR BLEEDING  TENDERNESS IN MOUTH AND THROAT WITH OR WITHOUT PRESENCE OF ULCERS  *URINARY PROBLEMS  *BOWEL PROBLEMS  UNUSUAL RASH Items with * indicate a potential emergency and should be followed up as soon as possible.  Feel free to call the clinic you have any questions or concerns. The clinic phone number is [phone].  Please show the [place] at check-in to the Emergency Department and triage nurse.  Gemcitabine injection What is this medicine? GEMCITABINE (jem SIT a been) is a chemotherapy drug. This medicine is used to treat many types of cancer like breast cancer, lung cancer, pancreatic cancer, and ovarian cancer. This medicine may be used for other purposes; ask your health care provider or pharmacist if you have questions. COMMON BRAND NAME(S): Gemzar What should I tell my health care provider before I take this medicine? They need to know if you have any of these conditions: -blood disorders -infection -kidney disease -liver disease -recent or ongoing radiation therapy -an unusual or allergic reaction to gemcitabine, other chemotherapy, other medicines, foods, dyes, or preservatives -pregnant or trying to get pregnant -breast-feeding How should I use this medicine? This drug is  given as an infusion into a vein. It is administered in a hospital or clinic by a specially trained health care professional. Talk to your pediatrician regarding the use of this medicine in children. Special care may be needed. Overdosage: If you think you have taken too much of this medicine contact a poison control center or emergency room at once. NOTE: This medicine is only for you. Do not share this medicine with others. What if I miss a dose? It is important not to miss your dose. Call your doctor or health care professional if you are unable to keep an appointment. What may interact with this medicine? -medicines to increase blood counts like filgrastim, pegfilgrastim, sargramostim -some other chemotherapy drugs like cisplatin -vaccines Talk to your doctor or health care professional before taking any of these medicines: -acetaminophen -aspirin -ibuprofen -ketoprofen -naproxen This list may not describe all possible interactions. Give your health care provider a list of all the medicines, herbs, non-prescription drugs, or dietary supplements you use. Also tell them if you smoke, drink alcohol, or use illegal drugs. Some items may interact with your medicine. What should I watch for while using this medicine? Visit your doctor for checks on your progress. This drug may make you feel generally unwell. This is not uncommon, as chemotherapy can affect healthy cells as well as cancer cells. Report any side effects. Continue your course of treatment even though you feel ill unless your doctor tells you to stop. In some cases, you may be given additional medicines to help with side effects. Follow all directions for their use. Call your doctor or health care professional for advice if you get a fever,  chills or sore throat, or other symptoms of a cold or flu. Do not treat yourself. This drug decreases your body's ability to fight infections. Try to avoid being around people who are sick. This  medicine may increase your risk to bruise or bleed. Call your doctor or health care professional if you notice any unusual bleeding. Be careful brushing and flossing your teeth or using a toothpick because you may get an infection or bleed more easily. If you have any dental work done, tell your dentist you are receiving this medicine. Avoid taking products that contain aspirin, acetaminophen, ibuprofen, naproxen, or ketoprofen unless instructed by your doctor. These medicines may hide a fever. Women should inform their doctor if they wish to become pregnant or think they might be pregnant. There is a potential for serious side effects to an unborn child. Talk to your health care professional or pharmacist for more information. Do not breast-feed an infant while taking this medicine. What side effects may I notice from receiving this medicine? Side effects that you should report to your doctor or health care professional as soon as possible: -allergic reactions like skin rash, itching or hives, swelling of the face, lips, or tongue -low blood counts - this medicine may decrease the number of white blood cells, red blood cells and platelets. You may be at increased risk for infections and bleeding. -signs of infection - fever or chills, cough, sore throat, pain or difficulty passing urine -signs of decreased platelets or bleeding - bruising, pinpoint red spots on the skin, black, tarry stools, blood in the urine -signs of decreased red blood cells - unusually weak or tired, fainting spells, lightheadedness -breathing problems -chest pain -mouth sores -nausea and vomiting -pain, swelling, redness at site where injected -pain, tingling, numbness in the hands or feet -stomach pain -swelling of ankles, feet, hands -unusual bleeding Side effects that usually do not require medical attention (report to your doctor or health care professional if they continue or are  bothersome): -constipation -diarrhea -hair loss -loss of appetite -stomach upset This list may not describe all possible side effects. Call your doctor for medical advice about side effects. You may report side effects to FDA at 1-800-FDA-1088. Where should I keep my medicine? This drug is given in a hospital or clinic and will not be stored at home. NOTE: This sheet is a summary. It may not cover all possible information. If you have questions about this medicine, talk to your doctor, pharmacist, or health care provider.  2018 Elsevier/Gold Standard (2008-02-06 18:45:54)  Carboplatin injection What is this medicine? CARBOPLATIN (KAR boe pla tin) is a chemotherapy drug. It targets fast dividing cells, like cancer cells, and causes these cells to die. This medicine is used to treat ovarian cancer and many other cancers. This medicine may be used for other purposes; ask your health care provider or pharmacist if you have questions. COMMON BRAND NAME(S): Paraplatin What should I tell my health care provider before I take this medicine? They need to know if you have any of these conditions: -blood disorders -hearing problems -kidney disease -recent or ongoing radiation therapy -an unusual or allergic reaction to carboplatin, cisplatin, other chemotherapy, other medicines, foods, dyes, or preservatives -pregnant or trying to get pregnant -breast-feeding How should I use this medicine? This drug is usually given as an infusion into a vein. It is administered in a hospital or clinic by a specially trained health care professional. Talk to your pediatrician regarding the use of this  medicine in children. Special care may be needed. Overdosage: If you think you have taken too much of this medicine contact a poison control center or emergency room at once. NOTE: This medicine is only for you. Do not share this medicine with others. What if I miss a dose? It is important not to miss a dose.  Call your doctor or health care professional if you are unable to keep an appointment. What may interact with this medicine? -medicines for seizures -medicines to increase blood counts like filgrastim, pegfilgrastim, sargramostim -some antibiotics like amikacin, gentamicin, neomycin, streptomycin, tobramycin -vaccines Talk to your doctor or health care professional before taking any of these medicines: -acetaminophen -aspirin -ibuprofen -ketoprofen -naproxen This list may not describe all possible interactions. Give your health care provider a list of all the medicines, herbs, non-prescription drugs, or dietary supplements you use. Also tell them if you smoke, drink alcohol, or use illegal drugs. Some items may interact with your medicine. What should I watch for while using this medicine? Your condition will be monitored carefully while you are receiving this medicine. You will need important blood work done while you are taking this medicine. This drug may make you feel generally unwell. This is not uncommon, as chemotherapy can affect healthy cells as well as cancer cells. Report any side effects. Continue your course of treatment even though you feel ill unless your doctor tells you to stop. In some cases, you may be given additional medicines to help with side effects. Follow all directions for their use. Call your doctor or health care professional for advice if you get a fever, chills or sore throat, or other symptoms of a cold or flu. Do not treat yourself. This drug decreases your body's ability to fight infections. Try to avoid being around people who are sick. This medicine may increase your risk to bruise or bleed. Call your doctor or health care professional if you notice any unusual bleeding. Be careful brushing and flossing your teeth or using a toothpick because you may get an infection or bleed more easily. If you have any dental work done, tell your dentist you are receiving this  medicine. Avoid taking products that contain aspirin, acetaminophen, ibuprofen, naproxen, or ketoprofen unless instructed by your doctor. These medicines may hide a fever. Do not become pregnant while taking this medicine. Women should inform their doctor if they wish to become pregnant or think they might be pregnant. There is a potential for serious side effects to an unborn child. Talk to your health care professional or pharmacist for more information. Do not breast-feed an infant while taking this medicine. What side effects may I notice from receiving this medicine? Side effects that you should report to your doctor or health care professional as soon as possible: -allergic reactions like skin rash, itching or hives, swelling of the face, lips, or tongue -signs of infection - fever or chills, cough, sore throat, pain or difficulty passing urine -signs of decreased platelets or bleeding - bruising, pinpoint red spots on the skin, black, tarry stools, nosebleeds -signs of decreased red blood cells - unusually weak or tired, fainting spells, lightheadedness -breathing problems -changes in hearing -changes in vision -chest pain -high blood pressure -low blood counts - This drug may decrease the number of white blood cells, red blood cells and platelets. You may be at increased risk for infections and bleeding. -nausea and vomiting -pain, swelling, redness or irritation at the injection site -pain, tingling, numbness in the hands  or feet -problems with balance, talking, walking -trouble passing urine or change in the amount of urine Side effects that usually do not require medical attention (report to your doctor or health care professional if they continue or are bothersome): -hair loss -loss of appetite -metallic taste in the mouth or changes in taste This list may not describe all possible side effects. Call your doctor for medical advice about side effects. You may report side effects to  FDA at 1-800-FDA-1088. Where should I keep my medicine? This drug is given in a hospital or clinic and will not be stored at home. NOTE: This sheet is a summary. It may not cover all possible information. If you have questions about this medicine, talk to your doctor, pharmacist, or health care provider.  2018 Elsevier/Gold Standard (2008-01-02 14:38:05)

## 2017-03-09 ENCOUNTER — Telehealth: Payer: Self-pay | Admitting: *Deleted

## 2017-03-09 ENCOUNTER — Telehealth: Payer: Self-pay | Admitting: Hematology

## 2017-03-09 NOTE — Progress Notes (Signed)
[place]  [phone] [phone]  Clinic follow up Note   Patient Care Team: [name], MD as PCP - Charissa Bash, MD as Consulting Physician (Hematology) Gardenia Phlegm, NP as Nurse Practitioner (Hematology and Oncology)  03/14/2017  CHIEF COMPLAINTS Follow up breast cancer     Breast cancer of lower-outer quadrant of right female breast ([place])   09/03/2008 Initial Diagnosis    right breast cancer, T2N1Mo, stage IIA, ER+, PR+, HER2+ (ration 2.07). Pt declined surgery or other treatment.        05/20/2011 Tumor Marker    right breast mass biopsy showed IDA, ER+/PR+/HER2- (ratio 1.37)      09/04/2011 Cancer Staging    PET scan showed no distant mets      10/08/2014 Progression     lumbar spine MRI showed multiple bone metastasis , highly suspicious for  metastatic disease.      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      07/17/2015 Imaging    PET scan showed overall similar appearance of multifocal right breast cancer with extensive bone metastasis. Increased size of right pleural effusion, indeterminate for malignant involvement of the pleural fluids.      01/20/2016 Imaging    CT CAP with contrast showed small bilateral pleural effusion, right greater than left, similar osseous metastasis, no evidence of extraosseous metastasis. Bone scan showed no areas of suspicious uptake.      09/27/2016 Imaging    CT CAP w contrast 1. Heterogeneous perfusion of the liver identified on today's study with some areas of hypoattenuating nodularity. This raises  concern for development of liver metastases although this is not a definite finding today may simply reflect heterogeneous perfusion. MRI of the abdomen without and with contrast would prove helpful to further evaluate, as clinically warranted. 2. Imaging features suggest bony metastatic disease, stable in the interval. Given lack of uptake on bone scan performed earlier today, these bony changes likely reflect treated disease. 3. Persistent marked skin thickening and edema diffusely in the right breast tracking into the region of the right axilla. Not substantially changed in the interval. 4. Interval resolution of small bilateral pleural effusions with posterior right lower lobe collapse/consolidation.      10/10/2016 Imaging    MR Abdomen w/wo contrast 1. Unusual perfusion abnormality involving the liver could be related to prior radiation effect but I do not see any MR findings suspicious for hepatic metastatic disease. The portal and hepatic veins are patent. 2. Stable surgical and radiation changes involving the right breast. No significant change in 21.5 mm irregular enhancing right lateral breast lesion. 3. Diffuse osseous metastatic disease.      10/13/2016 - 10/22/2016 Radiation Therapy    SRS treatment 5 fractions to the left orbit bone metastasis with Dr. MTammi Klippel      02/01/2017 Imaging    CT CAP  IMPRESSION: Chest Impression: 1. No evidence of soft tissue metastasis in thorax. 2. Bilateral pleural effusions are increased. 3. Focus of round atelectasis RIGHT lung base.  Abdomen / Pelvis Impression: 1. No evidence of metastasis in soft tissue. 2.  Nodular liver consistent with cirrhosis. 3. Diffuse sclerotic skeletal metastasis.      02/02/2017 Imaging    Bone Scan IMPRESSION: 1. Several vague areas of increased activity in the ribs primarily the left anterior 6th or seventh rib but no CT correlate is seen to indicate definite metastatic lesion. Early metastatic  lesions cannot be excluded. 2. Increased activity along the right parietal calvarium could indicate metastatic involvement. 3. A focus of sclerosis within the right iliac bone by yesterday's CT does not demonstrate increased activity on bone scan.      02/18/2017 Imaging    MRI Abdomen 02/18/17 IMPRESSION: 1. Diffuse osseous metastatic disease, similar prior exams. 2. Diffuse abnormal signal throughout the liver probably representing a combination of macronodular hepatic cirrhosis with extensive fibrosis. The fibrotic portions are interspersed but are more prominent peripherally in the liver. No early arterial phase masslike enhancement to suggest hepatocellular carcinoma. Occasionally diffuse hepatic metastatic disease can cause a similar complex pattern, although cirrhosis is favored in this case. Sensitivity for smaller metastatic lesions to the liver is reduced due to the severity of the underlying global signal abnormality in the liver. 3. Considerable gallbladder wall thickening, although this may well be due to hypoproteinemia/hypoalbuminemia rather than necessarily being from inflammation. There is third spacing of fluid in the subcutaneous and mesenteric tissues which also suggests third spacing of fluid. 4. No biliary dilatation. 5. Chronic appearance of round atelectasis in the right lower lobe, potentially with tethering to a focus of pleural thickening and mild pleural enhancement as has been shown on multiple prior exams. Moderate to large right and moderate left pleural effusion.      02/23/2017 PET scan    PET 02/23/17 IMPRESSION: 1. Similar appearance of abnormal asymmetric skin thickening and soft tissue infiltration involving the right breast compatible with known multifocal breast cancer. Some of these changes may reflect treatment related changes. 2. No hypermetabolic soft tissue mass or adenopathy identified. 3. There is a focal area of abnormal asymmetric  increased uptake localizing to the anterior aspect of the left sixth rib. If there is a recent history of trauma to this area findings may reflect posttraumatic uptake. Metabolically active focal bone metastasis not excluded. Clinical correlation advise. 4. Persistent bilateral pleural effusions. 5. Aortic atherosclerosis        02/28/2017 Tumor Marker    ca27.29 is 806.0       03/01/2017 Imaging    [place] of liver  03/01/2017 IMPRESSION: No discrete lesions identified within the liver by ultrasound.      03/01/2017 Pathology Results    Hassan Rowan biopsy of the right lobe liver showed metastatic carcinoma, immunohistochemistry staining are positive for ER and GATA-3 with patchy week cytokeratin 7 positivity. CK 20 and GCDFP are negative. The morphology and immunophenotype are consistent with metastatic breast cancer.      03/08/2017 -  Chemotherapy    Chemo carboplatin AUC 4 on day 1 (may increase to 5 from cycle 2 if tolerates well) and gemcitabine 843m/m2 on day 1, 8 every 21 days, starting on 5/29          HISTORY OF PRESENTING ILLNESS (10/30/2014):  Margaret LVENISHA [id number].o. female  Was referred by her primary care physician to discuss the management probable metastatic breast cancer.  She was initially diagnosed with right breast cancer in November 2009. 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 In December  of 2009, she underwent a right axillary lymph node biopsy on 09/24/2008 which showed metastatic ductal carcinoma. (Case No: [id number]) The tumor was ER 100% positive, PR 0% negative, Ki67 - 22%. 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 The ratio of HER-2:CEP 17 was 2.07.  She decided to pursue nature therapy by diet and exercise, and did not have surgery and other tranditional therapy for her breast cancer.   In May 2011 she underwent a mammogram that showed a 3.8 cm invasive  duct carcinoma in the subareolar region of the right breast which had increased since November 2009.  The tumor was ER  99% positive,  PR 4% positive, HER-2 negative ( ratio 1.37).  She was seen by Dr. Estella Husk and Dr. Lucia Gaskins.  However, she failed multiple appointments and sought alternative medical care in [place]. This involved putting something on her right breast that caused some scarring.  She noticed the enlarged right breast mass and nipple inversion about one year ago. No pain, but some skin change. She developed left low back/buttock and hip and  pain in Nov 2015, was seen by PCP Dr. Jenny Reichmann who prescribed vicodin and tramadol and pain improved with meds. Due to the persistent pain, she underwent a lumber MRI  On 10/08/2014, which showed multiple bone metastasis.  She run out pain medication about a week ago,  But did not call for refill. She states her pain is not bad , but she appears to be uncomfortable when she stands up and walks.  She otherwise feels well overall,  Has good appetite and energy level. She lives alone and still functions well.  She comes in with her niece today.   Current therapy: Chemo carboplatin AUC 4 on day 1 (may increase to 5 from cycle 2 if tolerates well) and gemcitabine 826m/m2 on day 1, 8 every 21 days, started on 5/29   INTERIM HISTORY:  Ms. ACarstensreturns for follow-up. She presents to the clinic today with her daughter again. She reports her chemo went well and reports to having a mild pain in her chest after chemo. She wants to know how long she will do chemo. She reports to not being fatigued and she had blurry vision. She had nausea and took medication compazine and odenadron and also had trouble moving her bowl. She is taking [place] That she will increase to 3-4 across the day. Loss of appetite. She was not wanting to continue with treatment due to side effects.   [place] she will have to cancel  but sad about canceling it. It is one of the places she always wanted to go.    MEDICAL HISTORY:  Past Medical History:  Diagnosis Date  . 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 ASTHMA 01/31/2008  . Breast cancer ([facility] dx 2009   right breast  . BREAST CANCER, HX OF 11/14/2009  . BRONCHITIS, ACUTE 01/31/2008  . Cough 10/17/2009  . Dyspnea    increased exertion   . FATIGUE 11/14/2009  . GANGLION CYST 05/01/2009  . History of blood transfusion   . HYPERLIPIDEMIA 05/21/2007  . HYPERTENSION 05/05/2007  . LOW BACK PAIN 05/21/2007  . MENOPAUSAL DISORDER 05/22/2010  . Pain in joint, lower leg 05/10/2008  . RASH-NONVESICULAR 05/01/2009  . SHOULDER PAIN, LEFT 11/14/2009  . 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 VITAMIN B12 DEFICIENCY 05/23/2010    SURGICAL HISTORY: Past Surgical History:  Procedure Laterality Date  . OTHER SURGICAL HISTORY  fibroid removal  . removal of neck lump     benigh 1986    Social History   Social History  . Marital status: Widowed    Spouse name: N/A  . Number of children: 1  . Years of education: N/A   Social History Main Topics  . Smoking status: Never Smoker  . Smokeless tobacco: Never Used  . Alcohol use No  . Drug use: No  . Sexual activity: No   Other Topics Concern  . None   Social History Narrative  . None    FAMILY HISTORY: Family History  Problem Relation Age of Onset  . Hypertension Father   . Heart disease Father   . Asthma Other   . Diabetes Other   . Hypertension Other   . Cancer Paternal Aunt        leukemia ?  Marland Kitchen Colon cancer Neg Hx   . Esophageal cancer Neg Hx   . Pancreatic cancer Neg Hx   . Stomach cancer Neg Hx   . Liver disease Neg Hx   Paternal aunt had cancer, unknown type. No other family history of malignancy   ALLERGIES:  is allergic to asa [aspirin] and tomato.  MEDICATIONS:  Current Outpatient Prescriptions on File Prior to Visit  Medication Sig Dispense Refill  . Ascorbic Acid (VITAMIN C) 100 MG CHEW Chew 1 tablet  (100 mg total) by mouth every morning. 90 each 3  . calcium carbonate 1250 MG capsule Take 1,250 mg by mouth 2 (two) times daily with a meal. Not sure of type or dose    . cholecalciferol (VITAMIN D) 1000 units tablet Take 1,000 Units by mouth daily. Patient not sure of dose    . clotrimazole (GYNE-LOTRIMIN 3) 2 % vaginal cream Apply to affected bilaterall axilla sites as needed for rash. 21 g 1  . Cyanocobalamin (B-12 COMPLIANCE INJECTION IJ) Inject as directed every 30 (thirty) days.    . Nystatin POWD 1 apply to right axilla twice daily until rashes resolves 1 Bottle 1  . ondansetron (ZOFRAN) 4 MG tablet Take 1 tablet (4 mg total) by mouth every 6 (six) hours. 50 tablet 2  . prochlorperazine (COMPAZINE) 5 MG tablet Take 1-2 tablets (5-10 mg total) by mouth every 8 (eight) hours as needed for nausea or vomiting. 30 tablet 0   No current facility-administered medications on file prior to visit.   ; REVIEW OF SYSTEMS:  Constitutional: Denies fevers, chills or abnormal night sweats (+) low appetite  Eyes: Denies double vision or watery eyes (+) jaundice in eyes due to liver failure (+) blurry vision in left eye  Ears, nose, mouth, throat, and face: Denies mucositis or sore throat,  Respiratory: Denies dyspnea or wheezes  Cardiovascular: Denies palpitation, chest discomfort or lower extremity swelling Gastrointestinal:  Denies heartburn (+) dark urine due to Liver failure (+) nausea (+) constipation  Skin: Denies abnormal skin rashes  Lymphatics: Denies new lymphadenopathy or easy bruising Neurological:Denies numbness, tingling or new weaknesses Behavioral/Psych: Mood is stable, no new changes  All other systems were reviewed with the patient and are negative.  PHYSICAL EXAMINATION:  ECOG PERFORMANCE STATUS: 2 BP 122/67 (BP Location: Left Arm, Patient Position: Sitting)   Pulse 82   Temp 98 F (36.7 C) (Oral)   Resp 18   Ht _0  (1.651 m)   Wt 189 lb 4.8 oz (85.9 kg)   SpO2 100%    BMI 31.50 kg/m   GENERAL:alert, no distress and comfortable SKIN: texture,  turgor are normal, no significant lesions, (+) jundice EYES: normal, conjunctiva are pink and non-injected (+) has juandice in eyes  OROPHARYNX:no exudate, no erythema and lips, buccal mucosa, and tongue normal  NECK: supple, thyroid normal size, non-tender, without nodularity LYMPH:  no palpable lymphadenopathy in the cervical, axillary or inguinal LUNGS: clear to auscultation and percussion with normal breathing effort, slightly decreased breath sounds at the bottom of right lung. HEART: regular rate & rhythm and no murmurs and no lower extremity edema ABDOMEN:abdomen soft, non-tender and normal bowel sounds  Musculoskeletal:no cyanosis of digits and no clubbing, (+) mild edema on both legs and both feet, up to knee, L>R, could be due to liver failure PSYCH: alert & oriented x 3 with fluent speech NEURO: no focal motor/sensory deficits. Her left eye ball has limited range of movement  Breasts: exam deferred     LABORATORY DATA:  I have reviewed the data as listed CBC Latest Ref Rng & Units 03/14/2017 03/08/2017 03/01/2017  WBC 3.9 - 10.3 10e3/uL 2.3(L) 5.8 5.0  Hemoglobin 11.6 - 15.9 g/dL 9.5(L) 10.7(L) 10.3(L)  Hematocrit 34.8 - 46.6 % 26.4(L) 31.3(L) 29.3(L)  Platelets 145 - 400 10e3/uL 135(L) 159 142(L)    CMP Latest Ref Rng & Units 03/14/2017 03/08/2017 02/28/2017  Glucose 70 - 140 mg/dl 132 99 100  BUN 7.0 - 26.0 mg/dL 9.1 8.0 6.8(L)  Creatinine 0.6 - 1.1 mg/dL 0.7 0.7 0.7  Sodium 136 - 145 mEq/L 132(L) 133(L) 137  Potassium 3.5 - 5.1 mEq/L 3.7 3.7 3.6  Chloride 96 - 112 mEq/L - - -  CO2 22 - 29 mEq/L 25 21(L) 24  Calcium 8.4 - 10.4 mg/dL 8.2(L) 8.3(L) 8.4  Total Protein 6.4 - 8.3 g/dL 5.8(L) 5.9(L) 6.4  Total Bilirubin 0.20 - 1.20 mg/dL 15.79(HH) 14.61(HH) 12.74(HH)  Alkaline Phos 40 - 150 U/L 336(H) 357(H) 379(H)  AST 5 - 34 U/L 184(HH) 214(HH) 209(HH)  ALT 0 - 55 U/L 96(H) 100(H) 112(H)    CA27.29: 06/30/2012: 28 10/30/2014: 355 08/15/2015: 424 12/27/2014: 414 05/05/2016: 416.1 06/10/2016: 332.4 07/01/2016: 348.8 08/03/2016: 388.1 09/01/2016: 321.1 10/26/16: 404.3 11/30/16: 403.7 01/03/17: 815.9 02/07/17: 857.9 02/28/2017: 806.0     PATHOLOGY REPORT  Surgical pathology 03/01/2017 Diagnosis Liver, needle/core biopsy, right lobe - METASTATIC CARCINOMA - SEE COMMENT Microscopic Comment The liver biopsies have an infiltrative cellular process arranged predominantly in small nests. The patient's history of invasive ductal carcinoma is noted. By immunohistochemistry, the neoplastic cells are positive for ER and GATA-3 with patchy weak cytokeratin 7 positivity. Cytokeratin 20 and GCDFP are negative. The morphology and immunophenotype are consistent with metastatic breast carcinoma.   RIGHT BREAST MASS, 6 O'CLOCK, NEEDLE CORE BIOPSIES: 09/03/2008 MICROSCOPIC FOCUS OF DUCTAL CARCINOMA IN SITU ASSOCIATED WITH MICROSCOPIC FOCUS OF MICROINVASION. SEE COMMET.  LYMPH NODE, RIGHT AXILLARY, NEEDLE CORE BIOPSIES: METASTATIC DUCTAL CARCINOMA.  09/24/2008  Diagnosis 05/20/2011 Breast, right, needle core biopsy - INVASIVE DUCTAL CARCINOMA, SEE COMMENT. - LYMPHOVASCULAR INVASION PRESENT. Estrogen Receptor (Negative, <1%): 99%, STRONG STAINING INTENSITY Progesterone Receptor (Negative, <1%): 4%, STRONG HER-2/NEU BY CISH - NO AMPLIFICATION OF HER-2 DETECTED. THE RATIO OF HER-2: CEP 17 SIGNALS WAS 1.38.  Breast, right, needle core biopsy, 6:30 o'clock 12/13/2014 - INVASIVE MAMMARY CARCINOMA. - SEE MICROSCOPIC DESCRIPTION.  Diagnosis 08/01/2015 PLEURAL FLUID, RIGHT(SPECIMEN 1 OF 1 COLLECTED 08/01/15): METASTATIC CARCINOMA CONSISTENT WITH BREAST PRIMARY.   RADIOGRAPHIC STUDIES: I have personally reviewed the radiological images as listed and agreed with the findings in the report.  PET 07/17/2015 IMPRESSION: 1. Suspected active diverticulitis  along the sigmoid colon,  with focal mesenteric stranding and a small locule of gas which is either within an inflamed diverticulum or a micro perforation. No overt free air. 2. Overall similar appearance of multifocal right breast cancer with extensive osseous metastatic disease. The right axillary lymph nodes are less notable that there is a borderline hypermetabolic left axillary lymph node currently. 3. Increasing size of the right pleural effusion, now moderate. Appearance indeterminate for malignant involvement of the pleural fluid. 4. Faintly increased activity in a right infrahilar lymph node which could represent early metastatic spread. 5. Other imaging findings of potential clinical significance: Chronic left maxillary sinusitis. Low-density blood pool suggests anemia. Left adrenal adenoma. These results will be called to the ordering clinician or representative by the Radiologist Assistant, and communication documented in the PACS or zVision Dashboard.  Brain MRI with and without contrast on 06/27/2015, at [facility] -In determinate, ill-defined soft tissue infiltration in the left orbit with questionable enhancement, further evaluation with CT orbits with contrast is recommended to the cyst in ruling out metastasis. -No intracranial metastasis -Diffusely hypoechoic intense bone marrow signal throughout the skull base   Bone scan 05/31/2016 IMPRESSION: New increased up take in the ribs bilaterally worrisome for metastatic disease. Stable increased uptake in the right fronto poor parietal bone.  A bilateral rib series is recommended.   CT chest, abdomen and pelvis with contrast 05/31/2016 IMPRESSION: 1. Similar fit appearance of widespread it treated osseous metastatic disease, no progression. No findings of new extra osseous metastatic disease. 2. Continued skin thickening and edema in the right breast. The edema in the right breast and tracking in  the right chest wall is slightly increased compared to prior and the lateral soft tissue density in the right lateral breast is stable. 3. Similar small bilateral pleural effusions with some atelectasis at the right lung base possibly tethered to the parietal pleura. 4. Mild coronary artery atherosclerosis and atherosclerotic calcification of the aortic arch. 5. Airway thickening is present, suggesting bronchitis or reactive airways disease.  MR orbits w/wo contrast 07/22/2016  IMPRESSION: 1. Diffuse metastasis within the left orbit intraconal space, which likely invades the left extraocular muscles. No abnormal enhancement of the left optic nerve to strongly indicate nerve invasion although there does appear to be abnormal T2 signal within the substance of the L CN2, such as due to neuritis. The left globe appears spared. 2. No other soft tissue metastasis identified, an the right orbit appears normal. Evidence of osseous metastatic disease in the cervical spine and possibly also the calvarium.  CT CAP W Contrast 09/28/16 IMPRESSION: 1. Heterogeneous perfusion of the liver identified on today's study with some areas of hypoattenuating nodularity. This raises concern for development of liver metastases although this is not a definite finding today may simply reflect heterogeneous perfusion. MRI of the abdomen without and with contrast would prove helpful to further evaluate, as clinically warranted. 2. Imaging features suggest bony metastatic disease, stable in the interval. Given lack of uptake on bone scan performed earlier today, these bony changes likely reflect treated disease. 3. Persistent marked skin thickening and edema diffusely in the right breast tracking into the region of the right axilla. Not substantially changed in the interval. 4. Interval resolution of small bilateral pleural effusions with posterior right lower lobe collapse/consolidation.   MR Abdomen  w/wo contrast 10/10/2016 IMPRESSION: 1. Unusual perfusion abnormality involving the liver could be related to prior radiation effect but I do not  see any MR findings suspicious for hepatic metastatic disease. The portal and hepatic veins are patent. 2. Stable surgical and radiation changes involving the right breast. No significant change in 21.5 mm irregular enhancing right lateral breast lesion. 3. Diffuse osseous metastatic disease.  CT CAP 02/01/17 IMPRESSION: Chest Impression: 1. No evidence of soft tissue metastasis in thorax. 2. Bilateral pleural effusions are increased. 3. Focus of round atelectasis RIGHT lung base.  Abdomen / Pelvis Impression: 1. No evidence of metastasis in soft tissue. 2. Nodular liver consistent with cirrhosis. 3. Diffuse sclerotic skeletal metastasis.  Bone Scan 02/02/17 IMPRESSION: 1. Several vague areas of increased activity in the ribs primarily the left anterior 6th or seventh rib but no CT correlate is seen to indicate definite metastatic lesion. Early metastatic lesions cannot be excluded. 2. Increased activity along the right parietal calvarium could indicate metastatic involvement. 3. A focus of sclerosis within the right iliac bone by yesterday's CT does not demonstrate increased activity on bone scan.  US Abdomen 02/08/17 IMPRESSION: 1. Nodular liver as seen on the recent CT Abdomen and Pelvis suspicious for cirrhosis. This is felt to explain the circumferential gallbladder wall thickening on the basis of hepatocellular inflammation. No cholelithiasis or strong evidence of acute cholecystitis. 2. No evidence of biliary obstruction. 3. Right pleural effusion re- identified.  MRI Abdomen 02/18/17 IMPRESSION: 1. Diffuse osseous metastatic disease, similar prior exams. 2. Diffuse abnormal signal throughout the liver probably representing a combination of macronodular hepatic cirrhosis with extensive fibrosis. The fibrotic portions are  interspersed but are more prominent peripherally in the liver. No early arterial phase masslike enhancement to suggest hepatocellular carcinoma. Occasionally diffuse hepatic metastatic disease can cause a similar complex pattern, although cirrhosis is favored in this case. Sensitivity for smaller metastatic lesions to the liver is reduced due to the severity of the underlying global signal abnormality in the liver. 3. Considerable gallbladder wall thickening, although this may well be due to hypoproteinemia/hypoalbuminemia rather than necessarily being from inflammation. There is third spacing of fluid in the subcutaneous and mesenteric tissues which also suggests third spacing of fluid. 4. No biliary dilatation. 5. Chronic appearance of round atelectasis in the right lower lobe, potentially with tethering to a focus of pleural thickening and mild pleural enhancement as has been shown on multiple prior exams. Moderate to large right and moderate left pleural effusion.   PET 02/23/17 IMPRESSION: 1. Similar appearance of abnormal asymmetric skin thickening and soft tissue infiltration involving the right breast compatible with known multifocal breast cancer. Some of these changes may reflect treatment related changes. 2. No hypermetabolic soft tissue mass or adenopathy identified. 3. There is a focal area of abnormal asymmetric increased uptake localizing to the anterior aspect of the left sixth rib. If there is a recent history of trauma to this area findings may reflect posttraumatic uptake. Metabolically active focal bone metastasis not excluded. Clinical correlation advise. 4. Persistent bilateral pleural effusions. 5. Aortic atherosclerosis  US Biopsy 03/01/2017 IMPRESSION: No discrete lesions identified within the liver by ultrasound.  ASSESSMENT & PLAN:   Mrs. Linnemann is a 75 y.o. African-American female, who was diagnosed with node positive right breast  Invasive  adenocarcinoma in  2009, untreated,  Now presents with low back pain and lumbar spine MRI is highly suspicious for bone metastasis.  1. 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 -  Liver MRI on 10/10/16 showed unusual perfusion abnormality involving the liver, but no definite evidence of metastasis. -repeated staging CT scan on 02/01/2017 did not show evidence of liver metastasis, but nodular liver consistent with liver cirrhosis, she has no history of liver disease in the past, hepatitis B and C were negative, she does not drink alcohol much. -Ultrasound abdomen this morning showed nodular liver suspicious for liver cirrhosis. No bili obstruction.  -I previously referred her to GI, abd MRI was done which showed liver cirrhosis, no image evidence of liver metastasis -I previously discussed her restaging PET scan from 02/23/2017, which showed hypermetabolic right breast cancer, otherwise negative, no hypermetabolic lesions in the liver -I have held anastrozole and Ibrance since early May however her liver function continues getting worse,  -Given her underlying metastatic breast cancer, infiltrative liver metastasis is likely,  I strongly recommend patient to have liver biopsy.  - I previously reviewed the liver biopsy results with patient and her daughter, it confirms metastatic cancer from breast. -she has started chemo   2. 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 -she knows that her disease at this stage is incurable. The goal of treatment is palliative and to prolong her life. -she is on first line anastrozole, Leslee Home was added on in October 2016 due to slight disease progression. She was not very compliant, off Ibrance for 3-4 months and restarted in 01/2016. -I previously discussed her restaging CT and bone scan from 05/31/2016, which showed diffuse similar appearing bone lesions, no other new metastasis. Bone scan showed mild bilateral rib uptake, but no corresponding findings on the CT. I think she has stable disease overall. -I previously reviewed her restaging CT scan and bone scan from 09/27/2016, which showed stable bone mets. CT scan showed heterogeneous perfusion of the liver indeterminate -I previously reviewed MR abdomen scan from 10/10/2016 with the patient, which showed no evidence of metastatic disease in liver  -She has developed worsening liver function, especially upper bilirubinemia, highly suspicious for diffuse liver metastasis, although this no evidence on the recent MRI or PET -Liver biopsy confirmed diffuse metastasis from breast cancer -She has started the practice of chemotherapy with gemcitabine and carboplatin, tolerated the first week treatment moderately well -The goal of therapy is palliative  -Lab reviewed, significant elevated total bilirubin, liver enzyme is slightly improved the last week, we'll proceed cycle 1 day 8 gemcitabine tomorrow -I encouraged her to drink ensure boost for when she has low appetite and increase seneca to 3-4 tablets a day to help with constipation   3. Left orbit bone metastasis  -this was found on her brain MRI in 06/2015 when she had heaache, she has issues with left eye ball movement, was evaluated by Dr. Emilio Math at [facility] -this is likely a metastatic lesion from her breast cancer -I previously discussed her orbital MRI  scan findings, which showed diffuse metastases to status is within the left orbital with invading the left extraocular muscles. No optical nerve involvement. -She previously completed treatment with radiation oncologist Dr. Tammi Klippel, who delivered 5 fractions of SRS to  her left orbit bone metastasis    4. Low back/ hip pain, Secondary to metastatic cancer to bones  -this has resolved since she started anastrozole -continue Xgeva every 3 months, will monitor  5. Anemia -Likely secondary to her breast cancer, bone metastases and B12 deficiency -Slightly improved after B12 injections. Continue B12 monthly for now. -No need for transfusion. We'll follow up closely. -The patient was previously neutropenic because she took 2 extra doses of Ibrance.   6. Goal of care discussion  -We have repeated discussed the incurable nature of her cancer, and the overall poor prognosis, especially she has developed liver failure from diffuse liver metastasis from her breast cancer. The treatment option is very limited. -The patient understands the goal of care is palliative. -I recommend do not resuscitation. She has agreed DNR/DNI. Her daughter is supportive to her decision.   7. Nausea and fatigue -she will use compazine as needed for nausea  -I also previously encourage her to try Maalox for her gastric discomfort -Refill compazine 1-2 tablets  every 8 hours -I suggested she take Compazine as needed anytime  8. LE edema -She has previously developed left foot pain and swollen, on exam she has bilateral lower extremity edema, mild, more prominent in the left -Her previously  x-ray was negative -I previously obtain a bilateral lower extremity Doppler to ruled out DVT which was negative on 01/07/2017   Plan:  -lab reviewed, mild pancytopenia, significant hyperbilirubinemia and transaminitis, adequate for treatment, we'll proceed cycle 1 day 8 gemcitabine tomorrow  -Chemo carbo and gemcitabine on 6/19 and  6/26, I will see her on 6/18  All questions were answered. The patient knows to call the clinic with any problems, questions or concerns.  I spent 20 minutes counseling the patient face to face. The total time spent in the appointment was 30 minutes and more than 50% was on counseling.  This document serves as a record of services personally performed by Truitt Merle, MD. It was created on her behalf by Joslyn Devon, a trained medical scribe. The creation of this record is based on the scribe's personal observations and the provider's statements to them. This document has been checked and approved by the attending provider.    Truitt Merle, MD 03/14/2017

## 2017-03-09 NOTE — Telephone Encounter (Signed)
Called & spoke with daugher, Frederich Chick & she reports pt did well with chemo with no c/o's.  Informed to call for any concerns/problems.  Daughter expressed understanding.  She states pt is against Port at this time.

## 2017-03-09 NOTE — Telephone Encounter (Signed)
Lab, Chemo and follow up with Dr Burr Medico scheduled and confirmed with patient, per 03/04/17 los.

## 2017-03-09 NOTE — Telephone Encounter (Signed)
-----   Message from Veverly Fells, RN sent at 03/08/2017 12:21 PM EDT ----- Regarding: First-time Follow-up MD Burr Medico Contact: [place], a patient of MD FENG, had gemzar and carboplatin for the first time today. Tolerated her treatment well. Difficult IV access, started conversation about the idea of getting a port-a-cath

## 2017-03-13 DIAGNOSIS — C787 Secondary malignant neoplasm of liver and intrahepatic bile duct: Secondary | ICD-10-CM | POA: Insufficient documentation

## 2017-03-14 ENCOUNTER — Other Ambulatory Visit (HOSPITAL_BASED_OUTPATIENT_CLINIC_OR_DEPARTMENT_OTHER): Payer: Medicare HMO

## 2017-03-14 ENCOUNTER — Telehealth: Payer: Self-pay | Admitting: Hematology

## 2017-03-14 ENCOUNTER — Encounter: Payer: Self-pay | Admitting: Hematology

## 2017-03-14 ENCOUNTER — Emergency Department (HOSPITAL_COMMUNITY)
Admission: EM | Admit: 2017-03-14 | Discharge: 2017-03-15 | Disposition: A | Discharge status: Home / Self Care | Payer: Medicare HMO | Attending: Emergency Medicine | Admitting: Emergency Medicine

## 2017-03-14 ENCOUNTER — Ambulatory Visit (HOSPITAL_BASED_OUTPATIENT_CLINIC_OR_DEPARTMENT_OTHER): Payer: Medicare HMO | Admitting: Hematology

## 2017-03-14 ENCOUNTER — Encounter (HOSPITAL_COMMUNITY): Payer: Self-pay | Admitting: Emergency Medicine

## 2017-03-14 VITALS — BP 122/67 | HR 82 | Temp 98.0°F | Resp 18 | Ht 65.0 in | Wt 189.3 lb

## 2017-03-14 DIAGNOSIS — Z79899 Other long term (current) drug therapy: Secondary | ICD-10-CM | POA: Insufficient documentation

## 2017-03-14 DIAGNOSIS — R1110 Vomiting, unspecified: Secondary | ICD-10-CM | POA: Diagnosis present

## 2017-03-14 DIAGNOSIS — D649 Anemia, unspecified: Secondary | ICD-10-CM

## 2017-03-14 DIAGNOSIS — K529 Noninfective gastroenteritis and colitis, unspecified: Secondary | ICD-10-CM | POA: Diagnosis not present

## 2017-03-14 DIAGNOSIS — R109 Unspecified abdominal pain: Secondary | ICD-10-CM | POA: Diagnosis not present

## 2017-03-14 DIAGNOSIS — Z170 Estrogen receptor positive status [ER+]: Secondary | ICD-10-CM

## 2017-03-14 DIAGNOSIS — C782 Secondary malignant neoplasm of pleura: Secondary | ICD-10-CM

## 2017-03-14 DIAGNOSIS — G893 Neoplasm related pain (acute) (chronic): Secondary | ICD-10-CM

## 2017-03-14 DIAGNOSIS — R740 Nonspecific elevation of levels of transaminase and lactic acid dehydrogenase [LDH]: Secondary | ICD-10-CM | POA: Diagnosis not present

## 2017-03-14 DIAGNOSIS — C50911 Malignant neoplasm of unspecified site of right female breast: Secondary | ICD-10-CM

## 2017-03-14 DIAGNOSIS — C7951 Secondary malignant neoplasm of bone: Secondary | ICD-10-CM | POA: Diagnosis not present

## 2017-03-14 DIAGNOSIS — C787 Secondary malignant neoplasm of liver and intrahepatic bile duct: Secondary | ICD-10-CM | POA: Diagnosis not present

## 2017-03-14 DIAGNOSIS — E538 Deficiency of other specified B group vitamins: Secondary | ICD-10-CM

## 2017-03-14 DIAGNOSIS — J45909 Unspecified asthma, uncomplicated: Secondary | ICD-10-CM | POA: Insufficient documentation

## 2017-03-14 DIAGNOSIS — I10 Essential (primary) hypertension: Secondary | ICD-10-CM | POA: Diagnosis not present

## 2017-03-14 DIAGNOSIS — K7200 Acute and subacute hepatic failure without coma: Secondary | ICD-10-CM

## 2017-03-14 DIAGNOSIS — D630 Anemia in neoplastic disease: Secondary | ICD-10-CM

## 2017-03-14 DIAGNOSIS — C50511 Malignant neoplasm of lower-outer quadrant of right female breast: Secondary | ICD-10-CM | POA: Diagnosis not present

## 2017-03-14 DIAGNOSIS — R0602 Shortness of breath: Secondary | ICD-10-CM | POA: Diagnosis not present

## 2017-03-14 DIAGNOSIS — Z853 Personal history of malignant neoplasm of breast: Secondary | ICD-10-CM | POA: Insufficient documentation

## 2017-03-14 LAB — COMPREHENSIVE METABOLIC PANEL
ALBUMIN: 2.2 g/dL — AB (ref 3.5–5.0)
ALK PHOS: 336 U/L — AB (ref 40–150)
ALK PHOS: 309 U/L — AB (ref 38–126)
ALT: 96 U/L — ABNORMAL HIGH (ref 0–55)
ALT: 97 U/L — AB (ref 14–54)
ANION GAP: 8 meq/L (ref 3–11)
ANION GAP: 10 (ref 5–15)
AST: 184 U/L (ref 5–34)
AST: 187 U/L — ABNORMAL HIGH (ref 15–41)
Albumin: 2.6 g/dL — ABNORMAL LOW (ref 3.5–5.0)
BILIRUBIN TOTAL: 15.79 mg/dL — AB (ref 0.20–1.20)
BUN: 9.1 mg/dL (ref 7.0–26.0)
BUN: 11 mg/dL (ref 6–20)
CALCIUM: 8.2 mg/dL — AB (ref 8.4–10.4)
CALCIUM: 8.2 mg/dL — AB (ref 8.9–10.3)
CHLORIDE: 99 mmol/L — AB (ref 101–111)
CO2: 25 mEq/L (ref 22–29)
CO2: 23 mmol/L (ref 22–32)
CREATININE: 0.48 mg/dL (ref 0.44–1.00)
Chloride: 99 mEq/L (ref 98–109)
Creatinine: 0.7 mg/dL (ref 0.6–1.1)
GLUCOSE: 132 mg/dL (ref 70–140)
Glucose, Bld: 100 mg/dL — ABNORMAL HIGH (ref 65–99)
Potassium: 3.7 mEq/L (ref 3.5–5.1)
Potassium: 3.8 mmol/L (ref 3.5–5.1)
Sodium: 132 mEq/L — ABNORMAL LOW (ref 136–145)
Sodium: 132 mmol/L — ABNORMAL LOW (ref 135–145)
TOTAL PROTEIN: 5.8 g/dL — AB (ref 6.4–8.3)
Total Bilirubin: 18.9 mg/dL (ref 0.3–1.2)
Total Protein: 6.6 g/dL (ref 6.5–8.1)

## 2017-03-14 LAB — CBC
HCT: 27.5 % — ABNORMAL LOW (ref 36.0–46.0)
Hemoglobin: 10.1 g/dL — ABNORMAL LOW (ref 12.0–15.0)
MCH: 30.2 pg (ref 26.0–34.0)
MCHC: 36.7 g/dL — ABNORMAL HIGH (ref 30.0–36.0)
MCV: 82.3 fL (ref 78.0–100.0)
PLATELETS: 137 10*3/uL — AB (ref 150–400)
RBC: 3.34 MIL/uL — AB (ref 3.87–5.11)
RDW: 16.5 % — ABNORMAL HIGH (ref 11.5–15.5)
WBC: 1.7 10*3/uL — AB (ref 4.0–10.5)

## 2017-03-14 LAB — CBC WITH DIFFERENTIAL/PLATELET
BASO%: 0.4 % (ref 0.0–2.0)
Basophils Absolute: 0 10*3/uL (ref 0.0–0.1)
EOS%: 0.4 % (ref 0.0–7.0)
Eosinophils Absolute: 0 10*3/uL (ref 0.0–0.5)
HCT: 26.4 % — ABNORMAL LOW (ref 34.8–46.6)
HGB: 9.5 g/dL — ABNORMAL LOW (ref 11.6–15.9)
LYMPH%: 43.6 % (ref 14.0–49.7)
MCH: 30.5 pg (ref 25.1–34.0)
MCHC: 36 g/dL (ref 31.5–36.0)
MCV: 84.9 fL (ref 79.5–101.0)
MONO#: 0.1 10*3/uL (ref 0.1–0.9)
MONO%: 4 % (ref 0.0–14.0)
NEUT%: 51.6 % (ref 38.4–76.8)
NEUTROS ABS: 1.2 10*3/uL — AB (ref 1.5–6.5)
PLATELETS: 135 10*3/uL — AB (ref 145–400)
RBC: 3.11 10*6/uL — AB (ref 3.70–5.45)
RDW: 16.6 % — ABNORMAL HIGH (ref 11.2–14.5)
WBC: 2.3 10*3/uL — AB (ref 3.9–10.3)
lymph#: 1 10*3/uL (ref 0.9–3.3)

## 2017-03-14 LAB — TECHNOLOGIST REVIEW

## 2017-03-14 LAB — LIPASE, BLOOD: LIPASE: 30 U/L (ref 11–51)

## 2017-03-14 MED ORDER — SODIUM CHLORIDE 0.9 % IV BOLUS (SEPSIS)
1000.0000 mL | Freq: Once | INTRAVENOUS | Status: AC
  Administered 2017-03-15: 1000 mL via INTRAVENOUS

## 2017-03-14 MED ORDER — ONDANSETRON HCL 4 MG/2ML IJ SOLN
4.0000 mg | Freq: Once | INTRAMUSCULAR | Status: AC
  Administered 2017-03-15: 4 mg via INTRAVENOUS
  Filled 2017-03-14: qty 2

## 2017-03-14 MED ORDER — MORPHINE SULFATE (PF) 2 MG/ML IV SOLN
4.0000 mg | Freq: Once | INTRAVENOUS | Status: DC
  Filled 2017-03-14: qty 2

## 2017-03-14 NOTE — Telephone Encounter (Signed)
Scheduled appt per 6/4 LOS. - gave patient AVS and calender per los.

## 2017-03-14 NOTE — ED Provider Notes (Signed)
[place] DEPT Provider Note   CSN: [id number] Arrival date & time: 03/14/17  2136  By signing my name below, I, Margaret Rubio, attest that this documentation has been prepared under the direction and in the presence of Margaret, Barbette Hair, MD. Electronically Signed: Lise Auer, ED Scribe. 03/15/17. 12:08 AM.  History   Chief Complaint Chief Complaint  Patient presents with  . Emesis  . Abdominal Pain   The history is provided by the patient. No language interpreter was used.    HPI Comments: Margaret Rubio is a 76 y.o. female with a PMHx of asthma, and breast cancer,  who presents to the Emergency Department complaining of gradually worsening, persistent vomiting that began today after her doctor's visit. She notes associated shortness of breath and a cough, she rates the severity of her pain an 8/10. Reports normal bowel movements.  Per pt's daughter today she was seen by Dr. Burr Medico. After her visit daughter notes pt began complaining of lower quadrant abdominal pain and intermittent vomiting. She also notes episodes of shortness of breath with exertion. Pt is on chemotherapy and reports her last treatment was on Tuesday. She denies having similar symptoms after treatment previously. No treatment tried. No alleviating or aggravating factors noted. Denies fever or chest pain.   Past Medical History:  Diagnosis Date  . 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 ASTHMA 01/31/2008  . Breast cancer [facility]) dx 2009   right breast  . BREAST CANCER, HX OF 11/14/2009  . BRONCHITIS, ACUTE 01/31/2008  . Cough 10/17/2009  . Dyspnea    increased exertion   . FATIGUE 11/14/2009  . GANGLION CYST 05/01/2009  . History of blood transfusion   . HYPERLIPIDEMIA 05/21/2007  . HYPERTENSION 05/05/2007  . LOW BACK PAIN 05/21/2007  . MENOPAUSAL DISORDER 05/22/2010  . Pain in joint, lower leg 05/10/2008  . RASH-NONVESICULAR 05/01/2009  . SHOULDER PAIN, LEFT 11/14/2009  . 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 VITAMIN B12  DEFICIENCY 05/23/2010    Patient Active Problem List   Diagnosis Date Noted  . 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 Liver failure ([place]) 03/05/2017  . Goals of care, counseling/discussion 10/26/2016  . 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 Left eye pain 05/19/2016  . Left-sided face pain 05/19/2016  . Peripheral edema 03/29/2016  . Acute URI 01/15/2016  . 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 Pleural effusion, right 11/04/2015  . Breast cancer metastasized to bone ([place]) 11/04/2015  . Encounter for chemotherapy management 07/31/2015  . Broken tooth 05/27/2015  . Left lumbar radiculopathy 10/08/2014  . Vitamin D deficiency 08/21/2014  . Unspecified vitamin D deficiency 09/26/2013  . Primary localized osteoarthrosis, lower leg 09/26/2013  . Right knee pain 12/19/2012  . Preventative health care 05/18/2011  . Vitamin B12 deficiency 05/23/2010  . MENOPAUSAL DISORDER 05/22/2010  . SHOULDER PAIN, LEFT 11/14/2009  . FATIGUE 11/14/2009  . Breast cancer of lower-outer quadrant of right female breast ([place]) 11/14/2009  . Cough 10/17/2009  . GANGLION CYST 05/01/2009  . Rash 05/01/2009  . Pain in joint, lower leg 05/10/2008  . ASTHMA 01/31/2008  . HYPERLIPIDEMIA 05/21/2007  . LOW BACK PAIN 05/21/2007  . Iron deficiency anemia 05/05/2007  . Essential hypertension 05/05/2007    Past Surgical History:  Procedure Laterality Date  . OTHER SURGICAL HISTORY     fibroid removal  . removal of neck lump     benigh 1986    OB History    No data available  Home Medications    Prior to Admission medications   Medication Sig Start Date End Date Taking? Authorizing Provider  Ascorbic Acid (VITAMIN C) 100 MG CHEW Chew 1 tablet (100 mg total) by mouth every morning. 01/31/15  Yes [name], MD  calcium carbonate 1250 MG capsule Take 1,250 mg by mouth 2 (two) times daily with a meal. Not sure of type or dose   Yes [provider]  cholecalciferol (VITAMIN D) 1000  units tablet Take 1,000 Units by mouth daily. Patient not sure of dose   Yes [provider]  clotrimazole (GYNE-LOTRIMIN 3) 2 % vaginal cream Apply to affected bilaterall axilla sites as needed for rash. 07/30/16  Yes Susanne Borders, NP  Cyanocobalamin (B-12 COMPLIANCE INJECTION IJ) Inject as directed every 30 (thirty) days.   Yes [provider]  Nystatin POWD 1 apply to right axilla twice daily until rashes resolves 10/26/16  Yes Truitt Merle, MD  ondansetron (ZOFRAN) 4 MG tablet Take 1 tablet (4 mg total) by mouth every 6 (six) hours. 02/11/17  Yes Esterwood, Amy S, PA-C  metroNIDAZOLE (FLAGYL) 500 MG tablet Take 1 tablet (500 mg total) by mouth 2 (two) times daily. 03/15/17   Margaret, Barbette Hair, MD  ondansetron (ZOFRAN ODT) 4 MG disintegrating tablet Take 1 tablet (4 mg total) by mouth every 8 (eight) hours as needed for nausea or vomiting. 03/15/17   Margaret, Barbette Hair, MD  prochlorperazine (COMPAZINE) 5 MG tablet Take 1-2 tablets (5-10 mg total) by mouth every 8 (eight) hours as needed for nausea or vomiting. Patient not taking: Reported on 03/15/2017 02/28/17   Truitt Merle, MD    Family History Family History  Problem Relation Age of Onset  . Hypertension Father   . Heart disease Father   . Asthma Other   . Diabetes Other   . Hypertension Other   . Cancer Paternal Aunt        leukemia ?  Marland Kitchen Colon cancer Neg Hx   . Esophageal cancer Neg Hx   . Pancreatic cancer Neg Hx   . Stomach cancer Neg Hx   . Liver disease Neg Hx     Social History Social History  Substance Use Topics  . Smoking status: Never Smoker  . Smokeless tobacco: Never Used  . Alcohol use No     Allergies   Asa [aspirin] and Tomato   Review of Systems Review of Systems  Constitutional: Negative for fever.  Respiratory: Positive for cough and shortness of breath.   Cardiovascular: Negative for chest pain.  Gastrointestinal: Positive for abdominal pain, nausea and vomiting. Negative for diarrhea.    All other systems reviewed and are negative.   Physical Exam Updated Vital Signs BP (!) 142/111 (BP Location: Right Arm)   Pulse 88   Temp 98.1 F (36.7 C) (Oral)   Resp (!) 24   SpO2 92%   Physical Exam  Constitutional: She is oriented to person, place, and time. No distress.  Elderly, chronically ill-appearing, no acute distress  HENT:  Head: Normocephalic and atraumatic.  Mucous membranes dry  Eyes: Pupils are equal, round, and reactive to light.  Scleral icterus noted  Cardiovascular: Normal rate, regular rhythm and normal heart sounds.   Pulmonary/Chest: Effort normal. No respiratory distress. She has no wheezes.  Abdominal: Soft. Bowel sounds are normal. She exhibits no mass. There is tenderness. There is no guarding.  Diffuse tenderness to palpation without rebound or guarding  Neurological: She is alert and oriented to  person, place, and time.  Skin: Skin is warm and dry.  Psychiatric: She has a normal mood and affect.  Nursing note and vitals reviewed.    ED Treatments / Results  DIAGNOSTIC STUDIES: Oxygen Saturation is 98% on RA, normal by my interpretation.   COORDINATION OF CARE: 11:43 PM-Discussed next steps with pt. Pt verbalized understanding and is agreeable with the plan.   Labs (all labs ordered are listed, but only abnormal results are displayed) Labs Reviewed  COMPREHENSIVE METABOLIC PANEL - Abnormal; Notable for the following:       Result Value   Sodium 132 (*)    Chloride 99 (*)    Glucose, Bld 100 (*)    Calcium 8.2 (*)    Albumin 2.6 (*)    AST 187 (*)    ALT 97 (*)    Alkaline Phosphatase 309 (*)    Total Bilirubin 18.9 (*)    All other components within normal limits  CBC - Abnormal; Notable for the following:    WBC 1.7 (*)    RBC 3.34 (*)    Hemoglobin 10.1 (*)    HCT 27.5 (*)    MCHC 36.7 (*)    RDW 16.5 (*)    Platelets 137 (*)    All other components within normal limits  DIFFERENTIAL - Abnormal; Notable for the  following:    Neutro Abs 1.3 (*)    Lymphs Abs 0.4 (*)    Monocytes Absolute 0.0 (*)    All other components within normal limits  LIPASE, BLOOD  URINALYSIS, ROUTINE W REFLEX MICROSCOPIC    EKG  EKG Interpretation None       Radiology Ct Angio Chest Pe W Or Wo Contrast  Result Date: 03/15/2017 CLINICAL DATA:  Shortness of breath. Lower abdominal pain today. History of metastatic breast cancer to liver, ongoing chemotherapy. EXAM: CT ANGIOGRAPHY CHEST CT ABDOMEN AND PELVIS WITH CONTRAST TECHNIQUE: Multidetector CT imaging of the chest was performed using the standard protocol during bolus administration of intravenous contrast. Multiplanar CT image reconstructions and MIPs were obtained to evaluate the vascular anatomy. Multidetector CT imaging of the abdomen and pelvis was performed using the standard protocol during bolus administration of intravenous contrast. CONTRAST:  155ml of Isovue 370 IV COMPARISON:  PET CT 02/23/2017 FINDINGS: CTA CHEST FINDINGS Cardiovascular: There are no filling defects within the pulmonary arteries to suggest pulmonary embolus. Coronary artery calcifications. Atherosclerosis of the thoracic aorta without dissection or aneurysm. No pericardial fluid. Mediastinum/Nodes: No mediastinal, hilar, or axillary adenopathy. Skin thickening and edematous changes of the right breast are partially included, grossly similar to prior PET. Small hiatal hernia, the esophagus is otherwise decompressed. Visualized thyroid gland is normal. Lungs/Pleura: Moderate bilateral pleural effusions have increased from prior PET. Minimal fluid tracks in the interlobar fissure on the left. Compressive atelectasis adjacent to bilateral pleural effusions. No septal thickening or ground-glass opacities to suggest pulmonary edema. No discrete pulmonary nodule in the aerated lung. Musculoskeletal: Osseous metastatic disease with mixed sclerotic and lucent lesions. No evidence pathologic fracture. Review  of the MIP images confirms the above findings. CT ABDOMEN and PELVIS FINDINGS Hepatobiliary: Scattered low-density lesions throughout the liver, largest in subcapsular segment 6 measures 12 mm, not hypermetabolic on recent PET nor well-visualized on recent hepatic protocol MRI. Small volume of perihepatic ascites it has increased from prior exam. Gallbladder is physiologically distended, mild wall thickening is likely secondary to underlying liver disease. Pancreas: No ductal dilatation or inflammation. Calcification at the pancreatic tail Spleen:  Normal in size without focal abnormality. Small volume of perisplenic ascites. Adrenals/Urinary Tract: Thickening of the left adrenal gland without discrete nodule. No right adrenal nodule. Homogeneous symmetric enhancement of both kidneys with symmetric renal excretion. No hydronephrosis. No perinephric edema. Urinary bladder is physiologically distended. Stomach/Bowel: Limited assessment given lack of enteric contrast. Small hiatal hernia. Fluid/ingested material within the stomach. There is a duodenum diverticulum without inflammation. There is colonic wall thickening from the cecum through the mid transverse colon. Mild small bowel wall thickening involving pelvic small bowel loops which appear edematous. Multifocal colonic diverticulosis without acute diverticulitis. The appendix is not confidently visualized. Vascular/Lymphatic: Aortic atherosclerosis and tortuosity. No acute vascular findings. No definite adenopathy in the abdomen or pelvis. Reproductive: Uterine fibroids. Ovaries are not well-defined. No evidence of adnexal mass. Other: Small to moderate volume of pelvic ascites. Diffuse mesenteric edema and small volume mesenteric free fluid. There is whole body wall edema. Musculoskeletal: Mixed sclerotic and lucent lesions throughout the bony pelvis and lumbar spine appear similar to prior PET. Review of the MIP images confirms the above findings. IMPRESSION: CT  CHEST IMPRESSION: 1. No pulmonary embolus. 2. Increased size of bilateral pleural effusions, now moderate in degree. There is adjacent compressive atelectasis. 3. Thoracic aortic atherosclerosis.  Coronary artery calcifications. 4. Grossly stable osseous metastatic disease. CT ABDOMEN/PELVIS IMPRESSION: 1. Colonic wall thickening from the cecum to the mid transverse colon suspicious for colitis. Mild small bowel thickening of pelvic small bowel loops with edematous appearance, suggesting enteritis. This may be infectious or inflammatory. 2. Heterogeneous appearance of the liver with ill-defined low-density lesions, suspicious for metastatic disease, however no hypermetabolism was seen on recent PET. Gallbladder wall thickening likely secondary to liver disease/ascites. 3. Small volume of abdominopelvic ascites, increased from prior exam. Whole body wall edema has also increased. 4. Aortic atherosclerosis. 5. Grossly stable osseous metastatic disease. Electronically Signed   By: Jeb Levering M.D.   On: 03/15/2017 02:23   Ct Abdomen Pelvis W Contrast  Result Date: 03/15/2017 CLINICAL DATA:  Shortness of breath. Lower abdominal pain today. History of metastatic breast cancer to liver, ongoing chemotherapy. EXAM: CT ANGIOGRAPHY CHEST CT ABDOMEN AND PELVIS WITH CONTRAST TECHNIQUE: Multidetector CT imaging of the chest was performed using the standard protocol during bolus administration of intravenous contrast. Multiplanar CT image reconstructions and MIPs were obtained to evaluate the vascular anatomy. Multidetector CT imaging of the abdomen and pelvis was performed using the standard protocol during bolus administration of intravenous contrast. CONTRAST:  168ml of Isovue 370 IV COMPARISON:  PET CT 02/23/2017 FINDINGS: CTA CHEST FINDINGS Cardiovascular: There are no filling defects within the pulmonary arteries to suggest pulmonary embolus. Coronary artery calcifications. Atherosclerosis of the thoracic aorta  without dissection or aneurysm. No pericardial fluid. Mediastinum/Nodes: No mediastinal, hilar, or axillary adenopathy. Skin thickening and edematous changes of the right breast are partially included, grossly similar to prior PET. Small hiatal hernia, the esophagus is otherwise decompressed. Visualized thyroid gland is normal. Lungs/Pleura: Moderate bilateral pleural effusions have increased from prior PET. Minimal fluid tracks in the interlobar fissure on the left. Compressive atelectasis adjacent to bilateral pleural effusions. No septal thickening or ground-glass opacities to suggest pulmonary edema. No discrete pulmonary nodule in the aerated lung. Musculoskeletal: Osseous metastatic disease with mixed sclerotic and lucent lesions. No evidence pathologic fracture. Review of the MIP images confirms the above findings. CT ABDOMEN and PELVIS FINDINGS Hepatobiliary: Scattered low-density lesions throughout the liver, largest in subcapsular segment 6 measures 12 mm, not hypermetabolic on recent  PET nor well-visualized on recent hepatic protocol MRI. Small volume of perihepatic ascites it has increased from prior exam. Gallbladder is physiologically distended, mild wall thickening is likely secondary to underlying liver disease. Pancreas: No ductal dilatation or inflammation. Calcification at the pancreatic tail Spleen: Normal in size without focal abnormality. Small volume of perisplenic ascites. Adrenals/Urinary Tract: Thickening of the left adrenal gland without discrete nodule. No right adrenal nodule. Homogeneous symmetric enhancement of both kidneys with symmetric renal excretion. No hydronephrosis. No perinephric edema. Urinary bladder is physiologically distended. Stomach/Bowel: Limited assessment given lack of enteric contrast. Small hiatal hernia. Fluid/ingested material within the stomach. There is a duodenum diverticulum without inflammation. There is colonic wall thickening from the cecum through the mid  transverse colon. Mild small bowel wall thickening involving pelvic small bowel loops which appear edematous. Multifocal colonic diverticulosis without acute diverticulitis. The appendix is not confidently visualized. Vascular/Lymphatic: Aortic atherosclerosis and tortuosity. No acute vascular findings. No definite adenopathy in the abdomen or pelvis. Reproductive: Uterine fibroids. Ovaries are not well-defined. No evidence of adnexal mass. Other: Small to moderate volume of pelvic ascites. Diffuse mesenteric edema and small volume mesenteric free fluid. There is whole body wall edema. Musculoskeletal: Mixed sclerotic and lucent lesions throughout the bony pelvis and lumbar spine appear similar to prior PET. Review of the MIP images confirms the above findings. IMPRESSION: CT CHEST IMPRESSION: 1. No pulmonary embolus. 2. Increased size of bilateral pleural effusions, now moderate in degree. There is adjacent compressive atelectasis. 3. Thoracic aortic atherosclerosis.  Coronary artery calcifications. 4. Grossly stable osseous metastatic disease. CT ABDOMEN/PELVIS IMPRESSION: 1. Colonic wall thickening from the cecum to the mid transverse colon suspicious for colitis. Mild small bowel thickening of pelvic small bowel loops with edematous appearance, suggesting enteritis. This may be infectious or inflammatory. 2. Heterogeneous appearance of the liver with ill-defined low-density lesions, suspicious for metastatic disease, however no hypermetabolism was seen on recent PET. Gallbladder wall thickening likely secondary to liver disease/ascites. 3. Small volume of abdominopelvic ascites, increased from prior exam. Whole body wall edema has also increased. 4. Aortic atherosclerosis. 5. Grossly stable osseous metastatic disease. Electronically Signed   By: Jeb Levering M.D.   On: 03/15/2017 02:23   Dg Abdomen Acute W/chest  Result Date: 03/15/2017 CLINICAL DATA:  Mid abdominal pain with nausea 24 hours ago EXAM: DG  ABDOMEN ACUTE W/ 1V CHEST COMPARISON:  02/01/2017 chest CT FINDINGS: Top normal-sized cardiac silhouette with aortic atherosclerosis. Mild pulmonary vascular congestion with left greater than right pleural effusions. Osteoarthritis of the [facility] and glenohumeral joints. No acute nor suspicious osseous abnormality. IMPRESSION: Mild pulmonary vascular congestion with bilateral left greater than right pleural effusions. Aortic atherosclerosis. Electronically Signed   By: Ashley Royalty M.D.   On: 03/15/2017 00:33    Procedures Procedures (including critical care time)  Medications Ordered in ED Medications  iopamidol (ISOVUE-370) 76 % injection (not administered)  sodium chloride 0.9 % bolus 1,000 mL (1,000 mLs Intravenous New Bag/Given 03/15/17 0147)  ondansetron (ZOFRAN) injection 4 mg (4 mg Intravenous Given 03/15/17 0148)  fentaNYL (SUBLIMAZE) injection 12.5 mcg (12.5 mcg Intravenous Given 03/15/17 0147)  iopamidol (ISOVUE-370) 76 % injection 100 mL (100 mLs Intravenous Contrast Given 03/15/17 0129)     Initial Impression / Assessment and Plan / ED Course  I have reviewed the triage vital signs and the nursing notes.  Pertinent labs & imaging results that were available during my care of the patient were reviewed by me and considered in my medical decision  making (see chart for details).     Patient presents with abdominal pain, vomiting, shortness of breath. Onset of symptoms after seeing her doctor today. She chronically ill-appearing but nontoxic. No acute distress. Vital signs reassuring. Diffuse tenderness without signs of peritonitis. She also reports shortness breath. History of active cancer. Lab work notable for elevated LFTs and bilirubin. Bilirubin up to 18 from 16. Otherwise LFTs are within range priors. She is mildly leukopenic but not neutropenic. CT scan to evaluate for PE and intra-abdominal pathology obtained. She has evidence of inflammation to suggest enteritis versus colitis. This is  likely inflammatory or viral. However, given immunosuppression, will treat Flagyl as well. She is tolerating fluids. Will discharge home with close oncology follow-up.  After history, exam, and medical workup I feel the patient has been appropriately medically screened and is safe for discharge home. Pertinent diagnoses were discussed with the patient. Patient was given return precautions.   Final Clinical Impressions(s) / ED Diagnoses   Final diagnoses:  SOB (shortness of breath)  Colitis  Enteritis    New Prescriptions New Prescriptions   METRONIDAZOLE (FLAGYL) 500 MG TABLET    Take 1 tablet (500 mg total) by mouth 2 (two) times daily.   ONDANSETRON (ZOFRAN ODT) 4 MG DISINTEGRATING TABLET    Take 1 tablet (4 mg total) by mouth every 8 (eight) hours as needed for nausea or vomiting.   I personally performed the services described in this documentation, which was scribed in my presence. The recorded information has been reviewed and is accurate.     Merryl Hacker, MD 03/15/17 [id number]

## 2017-03-14 NOTE — ED Triage Notes (Signed)
Patient c/o abdominal pain and vomiting since today. Denies diarrhea. Hx breast cancer, mets. Reports last chemo last Tuesday.

## 2017-03-15 ENCOUNTER — Encounter (HOSPITAL_COMMUNITY): Payer: Self-pay | Admitting: Radiology

## 2017-03-15 ENCOUNTER — Ambulatory Visit (HOSPITAL_BASED_OUTPATIENT_CLINIC_OR_DEPARTMENT_OTHER): Payer: Medicare HMO | Admitting: Hematology

## 2017-03-15 ENCOUNTER — Emergency Department (HOSPITAL_COMMUNITY): Payer: Medicare HMO

## 2017-03-15 ENCOUNTER — Other Ambulatory Visit: Payer: Self-pay

## 2017-03-15 ENCOUNTER — Ambulatory Visit (HOSPITAL_BASED_OUTPATIENT_CLINIC_OR_DEPARTMENT_OTHER): Payer: Medicare HMO

## 2017-03-15 ENCOUNTER — Ambulatory Visit: Payer: Medicare HMO

## 2017-03-15 ENCOUNTER — Telehealth: Payer: Self-pay | Admitting: *Deleted

## 2017-03-15 VITALS — BP 115/62 | HR 84 | Temp 98.2°F | Resp 18

## 2017-03-15 DIAGNOSIS — R0602 Shortness of breath: Secondary | ICD-10-CM | POA: Diagnosis not present

## 2017-03-15 DIAGNOSIS — R110 Nausea: Secondary | ICD-10-CM

## 2017-03-15 DIAGNOSIS — C7951 Secondary malignant neoplasm of bone: Secondary | ICD-10-CM

## 2017-03-15 DIAGNOSIS — D649 Anemia, unspecified: Secondary | ICD-10-CM

## 2017-03-15 DIAGNOSIS — C782 Secondary malignant neoplasm of pleura: Secondary | ICD-10-CM | POA: Diagnosis not present

## 2017-03-15 DIAGNOSIS — R109 Unspecified abdominal pain: Secondary | ICD-10-CM

## 2017-03-15 DIAGNOSIS — C787 Secondary malignant neoplasm of liver and intrahepatic bile duct: Secondary | ICD-10-CM | POA: Diagnosis not present

## 2017-03-15 DIAGNOSIS — R740 Nonspecific elevation of levels of transaminase and lactic acid dehydrogenase [LDH]: Secondary | ICD-10-CM

## 2017-03-15 DIAGNOSIS — Z170 Estrogen receptor positive status [ER+]: Secondary | ICD-10-CM

## 2017-03-15 DIAGNOSIS — C50511 Malignant neoplasm of lower-outer quadrant of right female breast: Secondary | ICD-10-CM

## 2017-03-15 DIAGNOSIS — C50911 Malignant neoplasm of unspecified site of right female breast: Secondary | ICD-10-CM

## 2017-03-15 LAB — DIFFERENTIAL
BASOS ABS: 0 10*3/uL (ref 0.0–0.1)
Basophils Relative: 0 %
EOS ABS: 0 10*3/uL (ref 0.0–0.7)
Eosinophils Relative: 0 %
LYMPHS ABS: 0.4 10*3/uL — AB (ref 0.7–4.0)
Lymphocytes Relative: 25 %
MONOS PCT: 2 %
Monocytes Absolute: 0 10*3/uL — ABNORMAL LOW (ref 0.1–1.0)
Neutro Abs: 1.3 10*3/uL — ABNORMAL LOW (ref 1.7–7.7)
Neutrophils Relative %: 73 %

## 2017-03-15 MED ORDER — METRONIDAZOLE 500 MG PO TABS: 500.0000 mg | ORAL_TABLET | Freq: Two times a day (BID) | ORAL | 0 refills | Status: DC

## 2017-03-15 MED ORDER — IOPAMIDOL (ISOVUE-370) INJECTION 76%
100.0000 mL | Freq: Once | INTRAVENOUS | Status: AC | PRN
  Administered 2017-03-15: 100 mL via INTRAVENOUS

## 2017-03-15 MED ORDER — SODIUM CHLORIDE 0.9 % IV SOLN
INTRAVENOUS | Status: DC
  Administered 2017-03-15: 16:00:00 via INTRAVENOUS

## 2017-03-15 MED ORDER — FENTANYL CITRATE (PF) 100 MCG/2ML IJ SOLN
12.5000 ug | Freq: Once | INTRAMUSCULAR | Status: AC
  Administered 2017-03-15: 12.5 ug via INTRAVENOUS
  Filled 2017-03-15: qty 2

## 2017-03-15 MED ORDER — OXYCODONE HCL 5 MG PO TABS: 5.0000 mg | ORAL_TABLET | ORAL | 0 refills | Status: AC | PRN

## 2017-03-15 MED ORDER — ONDANSETRON 4 MG PO TBDP: 4.0000 mg | ORAL_TABLET | Freq: Three times a day (TID) | ORAL | 0 refills | Status: AC | PRN

## 2017-03-15 MED ORDER — DEXAMETHASONE SODIUM PHOSPHATE 10 MG/ML IJ SOLN
8.0000 mg | Freq: Once | INTRAMUSCULAR | Status: AC
  Administered 2017-03-15: 8 mg via INTRAVENOUS

## 2017-03-15 MED ORDER — ONDANSETRON HCL 4 MG/2ML IJ SOLN
8.0000 mg | Freq: Once | INTRAMUSCULAR | Status: AC
  Administered 2017-03-15: 8 mg via INTRAVENOUS

## 2017-03-15 MED ORDER — IOPAMIDOL (ISOVUE-370) INJECTION 76%
INTRAVENOUS | Status: DC
Start: 2017-03-15 — End: 2017-03-15
  Filled 2017-03-15: qty 100

## 2017-03-15 MED ORDER — DEXAMETHASONE SODIUM PHOSPHATE 10 MG/ML IJ SOLN
INTRAMUSCULAR | Status: AC
  Filled 2017-03-15: qty 1

## 2017-03-15 MED ORDER — ONDANSETRON HCL 4 MG/2ML IJ SOLN
INTRAMUSCULAR | Status: AC
  Filled 2017-03-15: qty 4

## 2017-03-15 MED FILL — oxyCODONE HCL 5 MG TABS: 5 | 5 days supply | Qty: 30 | Fill #0

## 2017-03-15 MED FILL — metroNIDAZOLE 500 MG TABS: 500 | 7 days supply | Qty: 14 | Fill #0

## 2017-03-15 NOTE — Patient Instructions (Signed)
Dehydration, Adult Dehydration is when there is not enough fluid or water in your body. This happens when you lose more fluids than you take in. Dehydration can range from mild to very bad. It should be treated right away to keep it from getting very bad. Symptoms of mild dehydration may include:  Thirst.  Dry lips.  Slightly dry mouth.  Dry, warm skin.  Dizziness. Symptoms of moderate dehydration may include:  Very dry mouth.  Muscle cramps.  Dark pee (urine). Pee may be the color of tea.  Your body making less pee.  Your eyes making fewer tears.  Heartbeat that is uneven or faster than normal (palpitations).  Headache.  Light-headedness, especially when you stand up from sitting.  Fainting (syncope). Symptoms of very bad dehydration may include:  Changes in skin, such as: ? Cold and clammy skin. ? Blotchy (mottled) or pale skin. ? Skin that does not quickly return to normal after being lightly pinched and let go (poor skin turgor).  Changes in body fluids, such as: ? Feeling very thirsty. ? Your eyes making fewer tears. ? Not sweating when body temperature is high, such as in hot weather. ? Your body making very little pee.  Changes in vital signs, such as: ? Weak pulse. ? Pulse that is more than 100 beats a minute when you are sitting still. ? Fast breathing. ? Low blood pressure.  Other changes, such as: ? Sunken eyes. ? Cold hands and feet. ? Confusion. ? Lack of energy (lethargy). ? Trouble waking up from sleep. ? Short-term weight loss. ? Unconsciousness. Follow these instructions at home:  If told by your doctor, drink an ORS: ? Make an ORS by using instructions on the package. ? Start by drinking small amounts, about  cup (120 mL) every 5-10 minutes. ? Slowly drink more until you have had the amount that your doctor said to have.  Drink enough clear fluid to keep your pee clear or pale yellow. If you were told to drink an ORS, finish the ORS  first, then start slowly drinking clear fluids. Drink fluids such as: ? Water. Do not drink only water by itself. Doing that can make the salt (sodium) level in your body get too low (hyponatremia). ? Ice chips. ? Fruit juice that you have added water to (diluted). ? Low-calorie sports drinks.  Avoid: ? Alcohol. ? Drinks that have a lot of sugar. These include high-calorie sports drinks, fruit juice that does not have water added, and soda. ? Caffeine. ? Foods that are greasy or have a lot of fat or sugar.  Take over-the-counter and prescription medicines only as told by your doctor.  Do not take salt tablets. Doing that can make the salt level in your body get too high (hypernatremia).  Eat foods that have minerals (electrolytes). Examples include bananas, oranges, potatoes, tomatoes, and spinach.  Keep all follow-up visits as told by your doctor. This is important. Contact a doctor if:  You have belly (abdominal) pain that: ? Gets worse. ? Stays in one area (localizes).  You have a rash.  You have a stiff neck.  You get angry or annoyed more easily than normal (irritability).  You are more sleepy than normal.  You have a harder time waking up than normal.  You feel: ? Weak. ? Dizzy. ? Very thirsty.  You have peed (urinated) only a small amount of very dark pee during 6-8 hours. Get help right away if:  You have symptoms of   very bad dehydration.  You cannot drink fluids without throwing up (vomiting).  Your symptoms get worse with treatment.  You have a fever.  You have a very bad headache.  You are throwing up or having watery poop (diarrhea) and it: ? Gets worse. ? Does not go away.  You have blood or something green (bile) in your throw-up.  You have blood in your poop (stool). This may cause poop to look black and tarry.  You have not peed in 6-8 hours.  You pass out (faint).  Your heart rate when you are sitting still is more than 100 beats a  minute.  You have trouble breathing. This information is not intended to replace advice given to you by your health care provider. Make sure you discuss any questions you have with your health care provider. Document Released: 07/24/2009 Document Revised: 04/16/2016 Document Reviewed: 11/21/2015 Elsevier Interactive Patient Education  2018 [employer]   Nausea, Adult Feeling sick to your stomach (nausea) means that your stomach is upset or you feel like you have to throw up (vomit). Feeling sick to your stomach is usually not serious, but it may be an early sign of a more serious medical problem. As you feel sicker to your stomach, it can lead to throwing up (vomiting). If you throw up, or if you are not able to drink enough fluids, there is a risk of dehydration. Dehydration can make you feel tired and thirsty, have a dry mouth, and pee (urinate) less often. Older adults and people who have other diseases or a weak defense (immune) system have a higher risk of dehydration. The main goal of treating this condition is to:  Limit how often you feel sick to your stomach.  Prevent throwing up and dehydration.  Follow these instructions at home: Follow instructions from your doctor about how to care for yourself at home. Eating and drinking Follow these recommendations as told by your doctor:  Take an oral rehydration solution (ORS). This is a drink that is sold at pharmacies and stores.  Drink clear fluids in small amounts as you are able, such as: ? Water. ? Ice chips. ? Fruit juice that has water added (diluted fruit juice). ? Low-calorie sports drinks.  Eat bland, easy to digest foods in small amounts as you are able, such as: ? Bananas. ? Applesauce. ? Rice. ? Lean meats. ? Toast. ? Crackers.  Avoid drinking fluids that contain a lot of sugar or caffeine.  Avoid alcohol.  Avoid spicy or fatty foods.  General instructions  Drink enough fluid to keep your pee (urine)  clear or pale yellow.  Wash your hands often. If you cannot use soap and water, use hand sanitizer.  Make sure that all people in your household wash their hands well and often.  Rest at home while you get better.  Take over-the-counter and prescription medicines only as told by your doctor.  Breathe slowly and deeply when you feel sick to your stomach.  Watch your condition for any changes.  Keep all follow-up visits as told by your doctor. This is important. Contact a doctor if:  You have a headache.  You have new symptoms.  You feel sicker to your stomach.  You have a fever.  You feel light-headed or dizzy.  You throw up.  You are not able to keep fluids down. Get help right away if:  You have pain in your chest, neck, arm, or jaw.  You feel very weak or   you pass out (faint).  You have throw up that is bright red or looks like coffee grounds.  You have bloody or black poop (stools), or poop that looks like tar.  You have a very bad headache, a stiff neck, or both.  You have very bad pain, cramping, or bloating in your belly.  You have a rash.  You have trouble breathing or you are breathing very quickly.  Your heart is beating very quickly.  Your skin feels cold and clammy.  You feel confused.  You have pain while peeing.  You have signs of dehydration, such as: ? Dark pee, or very little or no pee. ? Cracked lips. ? Dry mouth. ? Sunken eyes. ? Sleepiness. ? Weakness. These symptoms may be an emergency. Do not wait to see if the symptoms will go away. Get medical help right away. Call your local emergency services (911 in the [place] Do not drive yourself to the hospital. This information is not intended to replace advice given to you by your health care provider. Make sure you discuss any questions you have with your health care provider. Document Released: 09/16/2011 Document Revised: 03/04/2016 Document Reviewed: 06/03/2015 Elsevier Interactive  Patient Education  2018 [employer]   

## 2017-03-15 NOTE — ED Notes (Signed)
Patient is alert and oriented x3.  She was given DC instructions and follow up visit instructions.  Patient gave verbal understanding. She was DC ambulatory under her own power to home.  V/S stable.  He was not showing any signs of distress on DC 

## 2017-03-15 NOTE — Telephone Encounter (Signed)
"  This is Margaret Rubio calling for my mother. We need to talk to her of her nurse.  She went to the ED with pain in her abdomen.  She was told she has colitis and enteritis.  Antibiotics were prescribed.  She will not be in today for chemotherapy.  We just left at 3:30 am.  She is not feeling well.  Pain is better but has no energy.  Please have someone call."  Call transferred to collaborative ext. [phone].

## 2017-03-15 NOTE — ED Notes (Signed)
Made two IV attempts in RAC and RFA.  Will let other RN attempt or have CT place IV for scan.

## 2017-03-15 NOTE — Progress Notes (Signed)
Dr. Burr Medico communicated no chemotherapy treatment today. Will instead order 575mL IVF over an hour, 8mg  zofran IV, and 8mg  decadron IV.

## 2017-03-15 NOTE — Discharge Instructions (Signed)
You were seen today for abdominal pain. Her CT scan shows evidence of colitis. Take Zofran as needed for nausea every 6 hours. He will also be placed on Flagyl. Follow-up with your oncologist. If you cannot tolerate fluids or become dehydrated, you need to be reevaluated.

## 2017-03-15 NOTE — Addendum Note (Signed)
Addended by: Truitt Merle on: 03/15/2017 03:42 PM   Modules accepted: Orders

## 2017-03-15 NOTE — ED Notes (Signed)
Below order not completed by EW. 

## 2017-03-18 ENCOUNTER — Telehealth: Payer: Self-pay | Admitting: Hematology

## 2017-03-18 ENCOUNTER — Encounter: Payer: Self-pay | Admitting: Hematology

## 2017-03-18 DIAGNOSIS — R109 Unspecified abdominal pain: Secondary | ICD-10-CM | POA: Insufficient documentation

## 2017-03-18 NOTE — Progress Notes (Signed)
[facility]  [phone] [phone]  Clinic follow up Note   Patient Care Team: [name], MD as PCP - Charissa Bash, MD as Consulting Physician (Hematology) Gardenia Phlegm, NP as Nurse Practitioner (Hematology and Oncology)  03/22/2017   CHIEF COMPLAINTS F/u metastatic breast cancer    Breast cancer of lower-outer quadrant of right female breast ([place])   09/03/2008 Initial Diagnosis    right breast cancer, T2N1Mo, stage IIA, ER+, PR+, HER2+ (ration 2.07). Pt declined surgery or other treatment.        05/20/2011 Tumor Marker    right breast mass biopsy showed IDA, ER+/PR+/HER2- (ratio 1.37)      09/04/2011 Cancer Staging    PET scan showed no distant mets      10/08/2014 Progression     lumbar spine MRI showed multiple bone metastasis , highly suspicious for  metastatic disease.      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      07/17/2015 Imaging    PET scan showed overall similar appearance of multifocal right breast cancer with extensive bone metastasis. Increased size of right pleural effusion, indeterminate for malignant involvement of the pleural fluids.      01/20/2016 Imaging    CT CAP with contrast showed small bilateral pleural effusion, right greater than left, similar osseous metastasis, no evidence of extraosseous metastasis. Bone scan showed no areas of suspicious uptake.      09/27/2016 Imaging    CT CAP w contrast 1. Heterogeneous perfusion of the liver identified on today's study with some areas of hypoattenuating nodularity. This raises  concern for development of liver metastases although this is not a definite finding today may simply reflect heterogeneous perfusion. MRI of the abdomen without and with contrast would prove helpful to further evaluate, as clinically warranted. 2. Imaging features suggest bony metastatic disease, stable in the interval. Given lack of uptake on bone scan performed earlier today, these bony changes likely reflect treated disease. 3. Persistent marked skin thickening and edema diffusely in the right breast tracking into the region of the right axilla. Not substantially changed in the interval. 4. Interval resolution of small bilateral pleural effusions with posterior right lower lobe collapse/consolidation.      10/10/2016 Imaging    MR Abdomen w/wo contrast 1. Unusual perfusion abnormality involving the liver could be related to prior radiation effect but I do not see any MR findings suspicious for hepatic metastatic disease. The portal and hepatic veins are patent. 2. Stable surgical and radiation changes involving the right breast. No significant change in 21.5 mm irregular enhancing right lateral breast lesion. 3. Diffuse osseous metastatic disease.      10/13/2016 - 10/22/2016 Radiation Therapy    SRS treatment 5 fractions to the left orbit bone metastasis with Dr. Tammi Klippel.      02/01/2017 Imaging    CT CAP  IMPRESSION: Chest Impression: 1. No evidence of soft tissue metastasis in thorax. 2. Bilateral pleural effusions are increased. 3. Focus of round atelectasis RIGHT lung base.  Abdomen / Pelvis Impression: 1. No evidence of metastasis in soft tissue. 2.  Nodular liver consistent with cirrhosis. 3. Diffuse sclerotic skeletal metastasis.      02/02/2017 Imaging    Bone Scan IMPRESSION: 1. Several vague areas of increased activity in the ribs primarily the left anterior 6th or seventh rib but no CT correlate is seen to indicate definite metastatic lesion. Early metastatic  lesions cannot be excluded. 2. Increased activity along the right parietal calvarium could indicate metastatic involvement. 3. A focus of sclerosis within the right iliac bone by yesterday's CT does not demonstrate increased activity on bone scan.      02/18/2017 Imaging    MRI Abdomen 02/18/17 IMPRESSION: 1. Diffuse osseous metastatic disease, similar prior exams. 2. Diffuse abnormal signal throughout the liver probably representing a combination of macronodular hepatic cirrhosis with extensive fibrosis. The fibrotic portions are interspersed but are more prominent peripherally in the liver. No early arterial phase masslike enhancement to suggest hepatocellular carcinoma. Occasionally diffuse hepatic metastatic disease can cause a similar complex pattern, although cirrhosis is favored in this case. Sensitivity for smaller metastatic lesions to the liver is reduced due to the severity of the underlying global signal abnormality in the liver. 3. Considerable gallbladder wall thickening, although this may well be due to hypoproteinemia/hypoalbuminemia rather than necessarily being from inflammation. There is third spacing of fluid in the subcutaneous and mesenteric tissues which also suggests third spacing of fluid. 4. No biliary dilatation. 5. Chronic appearance of round atelectasis in the right lower lobe, potentially with tethering to a focus of pleural thickening and mild pleural enhancement as has been shown on multiple prior exams. Moderate to large right and moderate left pleural effusion.      02/23/2017 PET scan    PET 02/23/17 IMPRESSION: 1. Similar appearance of abnormal asymmetric skin thickening and soft tissue infiltration involving the right breast compatible with known multifocal breast cancer. Some of these changes may reflect treatment related changes. 2. No hypermetabolic soft tissue mass or adenopathy identified. 3. There is a focal area of abnormal asymmetric  increased uptake localizing to the anterior aspect of the left sixth rib. If there is a recent history of trauma to this area findings may reflect posttraumatic uptake. Metabolically active focal bone metastasis not excluded. Clinical correlation advise. 4. Persistent bilateral pleural effusions. 5. Aortic atherosclerosis        02/28/2017 Tumor Marker    ca27.29 is 806.0       03/01/2017 Imaging    [place] of liver  03/01/2017 IMPRESSION: No discrete lesions identified within the liver by ultrasound.      03/01/2017 Pathology Results    Hassan Rowan biopsy of the right lobe liver showed metastatic carcinoma, immunohistochemistry staining are positive for ER and GATA-3 with patchy week cytokeratin 7 positivity. CK 20 and GCDFP are negative. The morphology and immunophenotype are consistent with metastatic breast cancer.      03/08/2017 -  Chemotherapy    Chemo carboplatin AUC 4 on day 1 (may increase to 5 from cycle 2 if tolerates well) and gemcitabine '800mg'$ /m2 on day 1, 8 every 21 days, starting on 5/29        03/15/2017 Imaging    IMPRESSION: CT CHEST IMPRESSION:  1. No pulmonary embolus. 2. Increased size of bilateral pleural effusions, now moderate in degree. There is adjacent compressive atelectasis. 3. Thoracic aortic atherosclerosis.  Coronary artery calcifications. 4. Grossly stable osseous metastatic disease.  CT ABDOMEN/PELVIS IMPRESSION:  1. Colonic wall thickening from the cecum to the mid transverse colon suspicious for colitis. Mild small bowel thickening of  pelvic small bowel loops with edematous appearance, suggesting enteritis. This may be infectious or inflammatory. 2. Heterogeneous appearance of the liver with ill-defined low-density lesions, suspicious for metastatic disease, however no hypermetabolism was seen on recent PET. Gallbladder wall thickening likely secondary to liver disease/ascites. 3. Small volume of abdominopelvic ascites, increased from  prior exam. Whole body wall edema has also increased. 4. Aortic atherosclerosis. 5. Grossly stable osseous metastatic disease      03/15/2017 Imaging     IMPRESSION: CT CHEST IMPRESSION:  1. No pulmonary embolus. 2. Increased size of bilateral pleural effusions, now moderate in degree. There is adjacent compressive atelectasis. 3. Thoracic aortic atherosclerosis.  Coronary artery calcifications. 4. Grossly stable osseous metastatic disease. CT ABDOMEN/PELVIS IMPRESSION:  1. Colonic wall thickening from the cecum to the mid transverse colon suspicious for colitis. Mild small bowel thickening of pelvic small bowel loops with edematous appearance, suggesting enteritis. This may be infectious or inflammatory. 2. Heterogeneous appearance of the liver with ill-defined low-density lesions, suspicious for metastatic disease, however no hypermetabolism was seen on recent PET. Gallbladder wall thickening likely secondary to liver disease/ascites. 3. Small volume of abdominopelvic ascites, increased from prior exam. Whole body wall edema has also increased. 4. Aortic atherosclerosis. 5. Grossly stable osseous metastatic disease.        HISTORY OF PRESENTING ILLNESS (10/30/2014):  Margaret Rubio 76 y.o. female  Was referred by her primary care physician to discuss the management probable metastatic breast cancer.  She was initially diagnosed with right breast cancer in November 2009. 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 In December of 2009, she underwent a right axillary lymph node biopsy on 09/24/2008 which showed metastatic ductal carcinoma. (Case No: [id number]) The tumor was ER 100% positive, PR 0% negative, Ki67 - 22%. 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 The ratio of HER-2:CEP 17 was 2.07.  She decided to pursue nature therapy by diet and exercise, and did not have  surgery and other tranditional therapy for her breast cancer.   In May 2011 she underwent a mammogram that showed a 3.8 cm invasive duct carcinoma in the subareolar region of the right breast which had increased since November 2009.  The tumor was ER  99% positive,  PR 4% positive, HER-2 negative ( ratio 1.37).  She was seen by Dr. Estella Husk and Dr. Lucia Gaskins.  However, she failed multiple appointments and sought alternative medical care in [place]. This involved putting something on her right breast that caused some scarring.  She noticed the enlarged right breast mass and nipple inversion about one year ago. No pain, but some skin change. She developed left low back/buttock and hip and  pain in Nov 2015, was seen by PCP Dr. Jenny Reichmann who prescribed vicodin and tramadol and pain improved with meds. Due to the persistent pain, she underwent a lumber MRI  On 10/08/2014, which showed multiple bone metastasis.  She run out pain medication about a week ago,  But did not call for refill. She states her pain is not bad , but she appears to be uncomfortable when she stands up and walks.  She otherwise feels well overall,  Has good appetite and energy level. She lives alone and still functions well.  She comes in with her niece today.   Current therapy: Chemo carboplatin AUC 4 on day 1 (may increase to 5 from cycle 2 if tolerates well) and  gemcitabine '800mg'$ /m2 on day 1, 8 every 21 days, started on 5/29   INTERIM HISTORY:  Ms. Gieselman is here today for follow up. She is not doing well overall. She experiences fatigue, nausea, denies stomach pain. Her appetite is low. When she doesn't eat, she drinks 2 ensure's daily. She also notices her hands are blue. She is vegan. Her daughter reports she has been complaining of heart burn for the past few days and she threw up last night. She also reports her mother's feet are swelling.   MEDICAL HISTORY:  Past Medical History:  Diagnosis Date  . ANEMIA-IRON DEFICIENCY  05/05/2007  . ASTHMA 01/31/2008  . Breast cancer [facility]) dx 2009   right breast  . BREAST CANCER, HX OF 11/14/2009  . BRONCHITIS, ACUTE 01/31/2008  . Cough 10/17/2009  . Dyspnea    increased exertion   . FATIGUE 11/14/2009  . GANGLION CYST 05/01/2009  . History of blood transfusion   . HYPERLIPIDEMIA 05/21/2007  . HYPERTENSION 05/05/2007  . LOW BACK PAIN 05/21/2007  . MENOPAUSAL DISORDER 05/22/2010  . Pain in joint, lower leg 05/10/2008  . RASH-NONVESICULAR 05/01/2009  . SHOULDER PAIN, LEFT 11/14/2009  . 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 VITAMIN B12 DEFICIENCY 05/23/2010    SURGICAL HISTORY: Past Surgical History:  Procedure Laterality Date  . OTHER SURGICAL HISTORY     fibroid removal  . removal of neck lump     [facility] 1986    Social History   Social History  . Marital status: Widowed    Spouse name: N/A  . Number of children: 1  . Years of education: N/A   Social History Main Topics  . Smoking status: Never Smoker  . Smokeless tobacco: Never Used  . Alcohol use No  . Drug use: No  . Sexual activity: No   Other Topics Concern  . None   Social History Narrative  . None    FAMILY HISTORY: Family History  Problem Relation Age of Onset  . Hypertension Father   . Heart disease Father   . Asthma Other   . Diabetes Other   . Hypertension Other   . Cancer Paternal Aunt        leukemia ?  Marland Kitchen Colon cancer Neg Hx   . Esophageal cancer Neg Hx   . Pancreatic cancer Neg Hx   . Stomach cancer Neg Hx   . Liver disease Neg Hx   Paternal aunt had cancer, unknown type. No other family history of malignancy   ALLERGIES:  is allergic to asa [aspirin] and tomato.  MEDICATIONS:  Current Outpatient Prescriptions on File Prior to Visit  Medication Sig Dispense Refill  . Ascorbic Acid (VITAMIN C) 100 MG CHEW Chew 1 tablet (100 mg total) by mouth every morning. 90 each 3  . calcium carbonate 1250 MG capsule Take 1,250 mg by mouth 2 (two) times daily with a meal. Not sure of type or dose      . cholecalciferol (VITAMIN D) 1000 units tablet Take 1,000 Units by mouth daily. Patient not sure of dose    . clotrimazole (GYNE-LOTRIMIN 3) 2 % vaginal cream Apply to affected bilaterall axilla sites as needed for rash. 21 g 1  . Cyanocobalamin (B-12 COMPLIANCE INJECTION IJ) Inject as directed every 30 (thirty) days.    . Nystatin POWD 1 apply to right axilla twice daily until rashes resolves 1 Bottle 1  . ondansetron (ZOFRAN ODT) 4 MG disintegrating tablet Take 1 tablet (4 mg total) by mouth every  8 (eight) hours as needed for nausea or vomiting. (Patient not taking: Reported on 03/22/2017) 20 tablet 0  . ondansetron (ZOFRAN) 4 MG tablet Take 1 tablet (4 mg total) by mouth every 6 (six) hours. (Patient not taking: Reported on 03/22/2017) 50 tablet 2  . oxyCODONE (OXY IR/ROXICODONE) 5 MG immediate release tablet Take 1 tablet (5 mg total) by mouth every 4 (four) hours as needed for severe pain. (Patient not taking: Reported on 03/22/2017) 30 tablet 0  . prochlorperazine (COMPAZINE) 5 MG tablet Take 1-2 tablets (5-10 mg total) by mouth every 8 (eight) hours as needed for nausea or vomiting. (Patient not taking: Reported on 03/15/2017) 30 tablet 0   No current facility-administered medications on file prior to visit.   ; REVIEW OF SYSTEMS:  Constitutional: Denies fevers, chills or abnormal night sweats (+)  Fatigue and low appetite Eyes: Denies double vision or watery eyes (+) jaundice in eyes due to liver failure (+) blurry vision in left eye  Ears, nose, mouth, throat, and face: Denies mucositis or sore throat,  Respiratory: Denies dyspnea or wheezes  Cardiovascular: Denies palpitation, chest discomfort or lower extremity swelling Gastrointestinal:  Denies heartburn (+) dark urine due to Liver failure (+) nausea  Skin: Denies abnormal skin rashes  Lymphatics: Denies new lymphadenopathy or easy bruising Neurological:Denies numbness, tingling or new weaknesses Behavioral/Psych: Mood is stable, no  new changes  MSK: (+)swelling of feet All other systems were reviewed with the patient and are negative.  PHYSICAL EXAMINATION:  ECOG PERFORMANCE STATUS: 3 BP 132/83 (BP Location: Left Arm, Patient Position: Sitting)   Pulse 82   Temp 97.7 F (36.5 C) (Oral)   Resp 17   Ht '5\' 5"'$  (1.651 m)   Wt 182 lb 3.2 oz (82.6 kg)   SpO2 97%   BMI 30.32 kg/m  GENERAL:alert, no distress and comfortable SKIN: texture, turgor are normal, no significant lesions, (+) jundice EYES: normal, conjunctiva are pink and non-injected (+) has juandice in eyes  OROPHARYNX:no exudate, no erythema and lips, buccal mucosa, and tongue normal  NECK: supple, thyroid normal size, non-tender, without nodularity LYMPH:  no palpable lymphadenopathy in the cervical, axillary or inguinal LUNGS: clear to auscultation and percussion with normal breathing effort, slightly decreased breath sounds at the bottom of right lung. HEART: regular rate & rhythm and no murmurs and no lower extremity edema ABDOMEN:abdomen soft, mild tenderness in the epigastric area , normal bowel sounds  Musculoskeletal:no cyanosis of digits and no clubbing, (+) mild edema on both legs and both feet, up to knee, L>R, could be due to liver failure PSYCH: alert & oriented x 3 with fluent speech NEURO: no focal motor/sensory deficits. Her left eye ball has limited range of movement  Breasts: exam deferred     LABORATORY DATA:  I have reviewed the data as listed CBC Latest Ref Rng & Units 03/22/2017 03/14/2017 03/14/2017  WBC 3.9 - 10.3 10e3/uL 7.4 1.7(L) 2.3(L)  Hemoglobin 11.6 - 15.9 g/dL 9.8(L) 10.1(L) 9.5(L)  Hematocrit 34.8 - 46.6 % 27.4(L) 27.5(L) 26.4(L)  Platelets 145 - 400 10e3/uL Clumped Platelets--Appears Adequate 137(L) 135(L)    CMP Latest Ref Rng & Units 03/22/2017 03/14/2017 03/14/2017  Glucose 70 - 140 mg/dl 94 100(H) 132  BUN 7.0 - 26.0 mg/dL 12.6 11 9.1  Creatinine 0.6 - 1.1 mg/dL 0.7 0.48 0.7  Sodium 136 - 145 mEq/L 133(L) 132(L) 132(L)    Potassium 3.5 - 5.1 mEq/L 2.8(LL) 3.8 3.7  Chloride 101 - 111 mmol/L - 99(L) -  CO2 22 - 29 mEq/L '22 23 25  '$ Calcium 8.4 - 10.4 mg/dL 8.7 8.2(L) 8.2(L)  Total Protein 6.4 - 8.3 g/dL 6.4 6.6 5.8(L)  Total Bilirubin 0.20 - 1.20 mg/dL 20.22(HH) 18.9(HH) 15.79(HH)  Alkaline Phos 40 - 150 U/L 368(H) 309(H) 336(H)  AST 5 - 34 U/L 164(H) 187(H) 184(HH)  ALT 0 - 55 U/L 67(H) 97(H) 96(H)   CA27.29: 06/30/2012: 28 10/30/2014: 355 08/15/2015: 424 12/27/2014: 414 05/05/2016: 416.1 06/10/2016: 332.4 07/01/2016: 348.8 08/03/2016: 388.1 09/01/2016: 321.1 10/26/16: 404.3 11/30/16: 403.7 01/03/17: 815.9 02/07/17: 857.9 02/28/2017: 806.0   PATHOLOGY REPORT  Surgical pathology 03/01/2017 Diagnosis Liver, needle/core biopsy, right lobe - METASTATIC CARCINOMA - SEE COMMENT Microscopic Comment The liver biopsies have an infiltrative cellular process arranged predominantly in small nests. The patient's history of invasive ductal carcinoma is noted. By immunohistochemistry, the neoplastic cells are positive for ER and GATA-3 with patchy weak cytokeratin 7 positivity. Cytokeratin 20 and GCDFP are negative. The morphology and immunophenotype are consistent with metastatic breast carcinoma.   RIGHT BREAST MASS, 6 O'CLOCK, NEEDLE CORE BIOPSIES: 09/03/2008 MICROSCOPIC FOCUS OF DUCTAL CARCINOMA IN SITU ASSOCIATED WITH MICROSCOPIC FOCUS OF MICROINVASION. SEE COMMET.  LYMPH NODE, RIGHT AXILLARY, NEEDLE CORE BIOPSIES: METASTATIC DUCTAL CARCINOMA.  09/24/2008  Diagnosis 05/20/2011 Breast, right, needle core biopsy - INVASIVE DUCTAL CARCINOMA, SEE COMMENT. - LYMPHOVASCULAR INVASION PRESENT. Estrogen Receptor (Negative, <1%): 99%, STRONG STAINING INTENSITY Progesterone Receptor (Negative, <1%): 4%, STRONG HER-2/NEU BY CISH - NO AMPLIFICATION OF HER-2 DETECTED. THE RATIO OF HER-2: CEP 17 SIGNALS WAS 1.38.  Breast, right, needle core biopsy, 6:30 o'clock 12/13/2014 - INVASIVE MAMMARY CARCINOMA. - SEE  MICROSCOPIC DESCRIPTION.  Diagnosis 08/01/2015 PLEURAL FLUID, RIGHT(SPECIMEN 1 OF 1 COLLECTED 08/01/15): METASTATIC CARCINOMA CONSISTENT WITH BREAST PRIMARY.   RADIOGRAPHIC STUDIES: I have personally reviewed the radiological images as listed and agreed with the findings in the report.  03/15/2017 CT CHEST IMPRESSION: 1. No pulmonary embolus. 2. Increased size of bilateral pleural effusions, now moderate in degree. There is adjacent compressive atelectasis. 3. Thoracic aortic atherosclerosis.  Coronary artery calcifications. 4. Grossly stable osseous metastatic disease . 03/15/2017 CT ABDOMEN/PELVIS IMPRESSION  1. Colonic wall thickening from the cecum to the mid transverse colon suspicious for colitis. Mild small bowel thickening of pelvic small bowel loops with edematous appearance, suggesting enteritis. This may be infectious or inflammatory. 2. Heterogeneous appearance of the liver with ill-defined low-density lesions, suspicious for metastatic disease, however no hypermetabolism was seen on recent PET. Gallbladder wall thickening likely secondary to liver disease/ascites. 3. Small volume of abdominopelvic ascites, increased from prior exam. Whole body wall edema has also increased. 4. Aortic atherosclerosis. 5. Grossly stable osseous metastatic disease  CT chest, abdomen and pelvis with contrast 03/14/2017 IMPRESSION: CT CHEST IMPRESSION:  1. No pulmonary embolus. 2. Increased size of bilateral pleural effusions, now moderate in degree. There is adjacent compressive atelectasis. 3. Thoracic aortic atherosclerosis.  Coronary artery calcifications. 4. Grossly stable osseous metastatic disease. CT ABDOMEN/PELVIS IMPRESSION:  1. Colonic wall thickening from the cecum to the mid transverse colon suspicious for colitis. Mild small bowel thickening of pelvic small bowel loops with edematous appearance, suggesting enteritis. This may be infectious or inflammatory. 2.  Heterogeneous appearance of the liver with ill-defined low-density lesions, suspicious for metastatic disease, however no hypermetabolism was seen on recent PET. Gallbladder wall thickening likely secondary to liver disease/ascites. 3. Small volume of abdominopelvic ascites, increased from prior exam. Whole body wall edema has also increased. 4. Aortic atherosclerosis. 5. Grossly stable osseous metastatic disease.  ASSESSMENT & PLAN:   Margaret Rubio is a 76 y.o. African-American female, who was diagnosed with node positive right breast  Invasive adenocarcinoma in  2009, untreated,  Now presents with low back pain and lumbar spine MRI is highly suspicious for bone metastasis.  1. 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 -Liver MRI on 10/10/16 showed unusual perfusion abnormality involving the liver, but no definite evidence of metastasis. -repeated staging CT scan on 02/01/2017 did not show evidence of liver metastasis, but nodular liver consistent with liver cirrhosis, she has no history of liver disease in the past, hepatitis B and C were negative, she does not drink alcohol much. -Ultrasound abdomen this morning showed nodular liver suspicious for liver cirrhosis. No bili obstruction.  -I previously referred her to GI, abd MRI was done which showed liver cirrhosis, no image evidence of liver metastasis -I previously discussed her restaging PET scan from 02/23/2017, which showed hypermetabolic right breast cancer, otherwise negative, no hypermetabolic lesions in the liver -I have held anastrozole and Ibrance since early May however her liver function continues getting worse,  -Given her underlying metastatic breast cancer, infiltrative liver metastasis is likely,  I strongly recommend patient  to have liver biopsy.  - I previously reviewed the liver biopsy results with patient and her daughter, it confirms metastatic cancer from breast. -Labs reviewed. Due to bilirubin being 20 today, I will hold treatment (6/12) - Potassium is low today at 2.8, I will prescribe potassium supplements for her to take twice daily today - I discussed the symptoms of liver failure including confusion. - She should not eat large portions of protein at once and watch her bowel movements - I will prescribe lactulose (6/12), she will use as need to keep soft/loose BM twice a day  - I will provide copy of labs  3.  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 -she knows that her disease at this stage is incurable. The goal of treatment is palliative and to prolong her life. -she is on first line anastrozole, Ilda Foil was added on in October 2016 due to slight disease progression. She was not very compliant, off Ibrance for 3-4 months and restarted in 01/2016. -I previously discussed her restaging CT and bone scan from 05/31/2016, which showed diffuse similar appearing bone lesions, no other new metastasis. Bone scan showed mild bilateral rib uptake, but no corresponding findings on the CT. I think she has stable disease overall. -I previously reviewed her restaging CT scan and bone scan from 09/27/2016, which showed stable bone mets. CT scan showed heterogeneous perfusion of the liver indeterminate -I previously reviewed MR abdomen scan from 10/10/2016 with the patient, which showed no evidence of  metastatic disease in liver  -She has developed worsening liver function, especially upper bilirubinemia, highly suspicious for diffuse liver metastasis, although this no evidence on the recent MRI or PET -Liver biopsy confirmed diffuse metastasis from breast cancer -She previously stared the  practice of chemotherapy with gemcitabine and carboplatin, tolerated the first week treatment moderately well -The goal of therapy is palliative  - Due to bilirubin being 20 today, I will hold treatment (6/12). -Due to her worsening liver failure, I think her life expectancy is very short, likely a few weeks to a few months. I recommend her to stop chemotherapy. - I recommended Hospice to alleviate symptoms. I discussed the benefits of it. She is eligible and will think about it.   4. Left orbit bone metastasis  -this was found on her brain MRI in 06/2015 when she had heaache, she has issues with left eye ball movement, was evaluated by Dr. Emilio Math at [facility] -this is likely a metastatic lesion from her breast cancer -I previously discussed her orbital MRI scan findings, which showed diffuse metastases to status is within the left orbital with invading the left extraocular muscles. No optical nerve involvement. -She previously completed treatment with radiation oncologist Dr. Tammi Klippel, who delivered 5 fractions of SRS to her left orbit bone metastasis    5. Anemia -Likely secondary to her breast cancer, bone metastases and B12 deficiency -Slightly improved after B12 injections. Continue B12 monthly for now. -No need for transfusion. We'll follow up closely. -The patient was previously neutropenic because she took 2 extra doses of Ibrance.   6. Goal of care discussion  -We again had long conversation today about teh incurable nature of her cancer and extremely poor prognosis, due to her worsening liver failure -The patient understands the goal of care is palliative. -I recommend do not resuscitation. She has  agreed DNR/DNI. Her daughter is supportive to her decision.  -I recommend hospice, she will think about it   7. Feet Swelling - I recommended she try compression socks or elevate her feet to try to alleviate her swelling  Plan:  -Due to her worsening liver failure, I will stop chemotherapy -I will prescribe lactulose (twice daily, prn) and potassium supplements and omeprazole, '40mg'$ , today -f/u with APP in 2 weeks -I had long conversation about hospice today, she will think about it. Her poor prognosis and likely short life expectancy were discussed with her daughter in detail.   All questions were answered. The patient knows to call the clinic with any problems, questions or concerns.  I spent 30 minutes counseling the patient face to face. The total time spent in the appointment was 40 minutes and more than 50% was on counseling.  This document serves as a record of services personally performed by Truitt Merle, MD. It was created on her behalf by Brandt Loosen, a trained medical scribe. The creation of this record is based on the scribe's personal observations and the provider's statements to them. This document has been checked and approved by the attending provider.    Truitt Merle, MD 03/22/2017

## 2017-03-18 NOTE — Telephone Encounter (Signed)
sw pt t confirm 6/12 appt per sch msg

## 2017-03-18 NOTE — Progress Notes (Signed)
[place]  [phone]) [phone] Fax:([phone]) [phone]  Clinic follow up Note   Patient Care Team: [name], MD as PCP - Charissa Bash, MD as Consulting Physician (Hematology) Gardenia Phlegm, NP as Nurse Practitioner (Hematology and Oncology)  03/15/2017  CHIEF COMPLAINTS Abdominal pain and nausea     Breast cancer of lower-outer quadrant of right female breast ([place])   09/03/2008 Initial Diagnosis    right breast cancer, T2N1Mo, stage IIA, ER+, PR+, HER2+ (ration 2.07). Pt declined surgery or other treatment.        05/20/2011 Tumor Marker    right breast mass biopsy showed IDA, ER+/PR+/HER2- (ratio 1.37)      09/04/2011 Cancer Staging    PET scan showed no distant mets      10/08/2014 Progression     lumbar spine MRI showed multiple bone metastasis , highly suspicious for  metastatic disease.      12/13/2014 Pathology Results    Right breast needle biopsy showed invasive ductal carcinoma, ER 100% positive, PR 2% positive, HER-2 negative with ratio 1.45 and copy number 2.25      12/14/2014 - 02/08/2017 Anti-estrogen oral therapy    Arimidex 21m daily, Ibrance added on 07/29/2015, stopped in 08/2015 after loss of f/u, and restarted on 01/10/2016, stopped due to worsening liver failure and disease progression.      07/17/2015 Imaging    PET scan showed overall similar appearance of multifocal right breast cancer with extensive bone metastasis. Increased size of right pleural effusion, indeterminate for malignant involvement of the pleural fluids.      01/20/2016 Imaging    CT CAP with contrast showed small bilateral pleural effusion, right greater than left, similar osseous metastasis, no evidence of extraosseous metastasis. Bone scan showed no areas of suspicious uptake.      09/27/2016 Imaging    CT CAP w contrast 1. Heterogeneous perfusion of the liver identified on today's study with some areas of hypoattenuating nodularity. This raises  concern for development of liver metastases although this is not a definite finding today may simply reflect heterogeneous perfusion. MRI of the abdomen without and with contrast would prove helpful to further evaluate, as clinically warranted. 2. Imaging features suggest bony metastatic disease, stable in the interval. Given lack of uptake on bone scan performed earlier today, these bony changes likely reflect treated disease. 3. Persistent marked skin thickening and edema diffusely in the right breast tracking into the region of the right axilla. Not substantially changed in the interval. 4. Interval resolution of small bilateral pleural effusions with posterior right lower lobe collapse/consolidation.      10/10/2016 Imaging    MR Abdomen w/wo contrast 1. Unusual perfusion abnormality involving the liver could be related to prior radiation effect but I do not see any MR findings suspicious for hepatic metastatic disease. The portal and hepatic veins are patent. 2. Stable surgical and radiation changes involving the right breast. No significant change in 21.5 mm irregular enhancing right lateral breast lesion. 3. Diffuse osseous metastatic disease.      10/13/2016 - 10/22/2016 Radiation Therapy    SRS treatment 5 fractions to the left orbit bone metastasis with Dr. MTammi Klippel      02/01/2017 Imaging    CT CAP  IMPRESSION: Chest Impression: 1. No evidence of soft tissue metastasis in thorax. 2. Bilateral pleural effusions are increased. 3. Focus of round atelectasis RIGHT lung base.  Abdomen / Pelvis Impression: 1. No evidence of metastasis in soft tissue. 2.  Nodular liver consistent with cirrhosis. 3. Diffuse sclerotic skeletal metastasis.      02/02/2017 Imaging    Bone Scan IMPRESSION: 1. Several vague areas of increased activity in the ribs primarily the left anterior 6th or seventh rib but no CT correlate is seen to indicate definite metastatic lesion. Early metastatic  lesions cannot be excluded. 2. Increased activity along the right parietal calvarium could indicate metastatic involvement. 3. A focus of sclerosis within the right iliac bone by yesterday's CT does not demonstrate increased activity on bone scan.      02/18/2017 Imaging    MRI Abdomen 02/18/17 IMPRESSION: 1. Diffuse osseous metastatic disease, similar prior exams. 2. Diffuse abnormal signal throughout the liver probably representing a combination of macronodular hepatic cirrhosis with extensive fibrosis. The fibrotic portions are interspersed but are more prominent peripherally in the liver. No early arterial phase masslike enhancement to suggest hepatocellular carcinoma. Occasionally diffuse hepatic metastatic disease can cause a similar complex pattern, although cirrhosis is favored in this case. Sensitivity for smaller metastatic lesions to the liver is reduced due to the severity of the underlying global signal abnormality in the liver. 3. Considerable gallbladder wall thickening, although this may well be due to hypoproteinemia/hypoalbuminemia rather than necessarily being from inflammation. There is third spacing of fluid in the subcutaneous and mesenteric tissues which also suggests third spacing of fluid. 4. No biliary dilatation. 5. Chronic appearance of round atelectasis in the right lower lobe, potentially with tethering to a focus of pleural thickening and mild pleural enhancement as has been shown on multiple prior exams. Moderate to large right and moderate left pleural effusion.      02/23/2017 PET scan    PET 02/23/17 IMPRESSION: 1. Similar appearance of abnormal asymmetric skin thickening and soft tissue infiltration involving the right breast compatible with known multifocal breast cancer. Some of these changes may reflect treatment related changes. 2. No hypermetabolic soft tissue mass or adenopathy identified. 3. There is a focal area of abnormal asymmetric  increased uptake localizing to the anterior aspect of the left sixth rib. If there is a recent history of trauma to this area findings may reflect posttraumatic uptake. Metabolically active focal bone metastasis not excluded. Clinical correlation advise. 4. Persistent bilateral pleural effusions. 5. Aortic atherosclerosis        02/28/2017 Tumor Marker    ca27.29 is 806.0       03/01/2017 Imaging    [place] of liver  03/01/2017 IMPRESSION: No discrete lesions identified within the liver by ultrasound.      03/01/2017 Pathology Results    Hassan Rowan biopsy of the right lobe liver showed metastatic carcinoma, immunohistochemistry staining are positive for ER and GATA-3 with patchy week cytokeratin 7 positivity. CK 20 and GCDFP are negative. The morphology and immunophenotype are consistent with metastatic breast cancer.      03/08/2017 -  Chemotherapy    Chemo carboplatin AUC 4 on day 1 (may increase to 5 from cycle 2 if tolerates well) and gemcitabine 843m/m2 on day 1, 8 every 21 days, starting on 5/29          HISTORY OF PRESENTING ILLNESS (10/30/2014):  Margaret LVENISHA [id number].o. female  Was referred by her primary care physician to discuss the management probable metastatic breast cancer.  She was initially diagnosed with right breast cancer in November 2009. 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 In December  of 2009, she underwent a right axillary lymph node biopsy on 09/24/2008 which showed metastatic ductal carcinoma. (Case No: [id number]) The tumor was ER 100% positive, PR 0% negative, Ki67 - 22%. 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 The ratio of HER-2:CEP 17 was 2.07.  She decided to pursue nature therapy by diet and exercise, and did not have surgery and other tranditional therapy for her breast cancer.   In May 2011 she underwent a mammogram that showed a 3.8 cm invasive  duct carcinoma in the subareolar region of the right breast which had increased since November 2009.  The tumor was ER  99% positive,  PR 4% positive, HER-2 negative ( ratio 1.37).  She was seen by Dr. Estella Husk and Dr. Lucia Gaskins.  However, she failed multiple appointments and sought alternative medical care in [place]. This involved putting something on her right breast that caused some scarring.  She noticed the enlarged right breast mass and nipple inversion about one year ago. No pain, but some skin change. She developed left low back/buttock and hip and  pain in Nov 2015, was seen by PCP Dr. Jenny Reichmann who prescribed vicodin and tramadol and pain improved with meds. Due to the persistent pain, she underwent a lumber MRI  On 10/08/2014, which showed multiple bone metastasis.  She run out pain medication about a week ago,  But did not call for refill. She states her pain is not bad , but she appears to be uncomfortable when she stands up and walks.  She otherwise feels well overall,  Has good appetite and energy level. She lives alone and still functions well.  She comes in with her niece today.   Current therapy: Chemo carboplatin AUC 4 on day 1 (may increase to 5 from cycle 2 if tolerates well) and gemcitabine 865m/m2 on day 1, 8 every 21 days, started on 5/29   INTERIM HISTORY:  Ms. AOtterwas seen by me yesterday. After her visit, she developed intermittent abdominal pain, nausea and vomiting. No fever or chills. Due to the severe pain, she went to emergency room last night. CT scan was done, she was discharged home with antibiotics of Flagyl. She still not feeling will today and I saw her in the infusion room before scheduled to chemotherapy.    MEDICAL HISTORY:  Past Medical History:  Diagnosis Date  . 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 ASTHMA 01/31/2008  . Breast cancer ([facility] dx 2009   right breast  . BREAST CANCER, HX OF 11/14/2009  . BRONCHITIS, ACUTE 01/31/2008  . Cough 10/17/2009  .  Dyspnea    increased exertion   . FATIGUE 11/14/2009  . GANGLION CYST 05/01/2009  . History of blood transfusion   . HYPERLIPIDEMIA 05/21/2007  . HYPERTENSION 05/05/2007  . LOW BACK PAIN 05/21/2007  . MENOPAUSAL DISORDER 05/22/2010  . Pain in joint, lower leg 05/10/2008  . RASH-NONVESICULAR 05/01/2009  . SHOULDER PAIN, LEFT 11/14/2009  . 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 VITAMIN B12 DEFICIENCY 05/23/2010    SURGICAL HISTORY: Past Surgical History:  Procedure Laterality Date  . OTHER SURGICAL HISTORY     fibroid removal  . removal of neck lump     benigh 1986    Social History   Social History  . Marital status: Widowed    Spouse name: N/A  . Number of children: 1  . Years of education: N/A   Social History Main Topics  . Smoking status: Never Smoker  . Smokeless tobacco: Never Used  . Alcohol  use No  . Drug use: No  . Sexual activity: No   Other Topics Concern  . None   Social History Narrative  . None    FAMILY HISTORY: Family History  Problem Relation Age of Onset  . Hypertension Father   . Heart disease Father   . Asthma Other   . Diabetes Other   . Hypertension Other   . Cancer Paternal Aunt        leukemia ?  Marland Kitchen Colon cancer Neg Hx   . Esophageal cancer Neg Hx   . Pancreatic cancer Neg Hx   . Stomach cancer Neg Hx   . Liver disease Neg Hx   Paternal aunt had cancer, unknown type. No other family history of malignancy   ALLERGIES:  is allergic to asa [aspirin] and tomato.  MEDICATIONS:  Current Outpatient Prescriptions on File Prior to Visit  Medication Sig Dispense Refill  . Ascorbic Acid (VITAMIN C) 100 MG CHEW Chew 1 tablet (100 mg total) by mouth every morning. 90 each 3  . calcium carbonate 1250 MG capsule Take 1,250 mg by mouth 2 (two) times daily with a meal. Not sure of type or dose    . cholecalciferol (VITAMIN D) 1000 units tablet Take 1,000 Units by mouth daily. Patient not sure of dose    . clotrimazole (GYNE-LOTRIMIN 3) 2 % vaginal cream Apply  to affected bilaterall axilla sites as needed for rash. 21 g 1  . Cyanocobalamin (B-12 COMPLIANCE INJECTION IJ) Inject as directed every 30 (thirty) days.    . metroNIDAZOLE (FLAGYL) 500 MG tablet Take 1 tablet (500 mg total) by mouth 2 (two) times daily. 14 tablet 0  . Nystatin POWD 1 apply to right axilla twice daily until rashes resolves 1 Bottle 1  . ondansetron (ZOFRAN ODT) 4 MG disintegrating tablet Take 1 tablet (4 mg total) by mouth every 8 (eight) hours as needed for nausea or vomiting. 20 tablet 0  . ondansetron (ZOFRAN) 4 MG tablet Take 1 tablet (4 mg total) by mouth every 6 (six) hours. 50 tablet 2  . oxyCODONE (OXY IR/ROXICODONE) 5 MG immediate release tablet Take 1 tablet (5 mg total) by mouth every 4 (four) hours as needed for severe pain. 30 tablet 0  . prochlorperazine (COMPAZINE) 5 MG tablet Take 1-2 tablets (5-10 mg total) by mouth every 8 (eight) hours as needed for nausea or vomiting. (Patient not taking: Reported on 03/15/2017) 30 tablet 0   No current facility-administered medications on file prior to visit.   ; REVIEW OF SYSTEMS:  Constitutional: Denies fevers, chills or abnormal night sweats (+) low appetite And fatigue Eyes: Denies double vision or watery eyes (+) jaundice in eyes due to liver failure (+) blurry vision in left eye  Ears, nose, mouth, throat, and face: Denies mucositis or sore throat,  Respiratory: Denies dyspnea or wheezes  Cardiovascular: Denies palpitation, chest discomfort or lower extremity swelling Gastrointestinal:  Denies heartburn (+) dark urine due to Liver failure (+) nausea (+) constipation (+) epigastric abdominal pain Skin: Denies abnormal skin rashes  Lymphatics: Denies new lymphadenopathy or easy bruising Neurological:Denies numbness, tingling or new weaknesses Behavioral/Psych: Mood is stable, no new changes  All other systems were reviewed with the patient and are negative.  PHYSICAL EXAMINATION:  ECOG PERFORMANCE STATUS: 3 Blood  rressure 115/62, heart rate 84, respiratory rate 18, temperature 36.8, pulse ox 99% on room air GENERAL:alert, no distress and comfortable SKIN: texture, turgor are normal, no significant lesions, (+) jundice EYES: normal,  conjunctiva are pink and non-injected (+) has juandice in eyes  OROPHARYNX:no exudate, no erythema and lips, buccal mucosa, and tongue normal  NECK: supple, thyroid normal size, non-tender, without nodularity LYMPH:  no palpable lymphadenopathy in the cervical, axillary or inguinal LUNGS: clear to auscultation and percussion with normal breathing effort, slightly decreased breath sounds at the bottom of right lung. HEART: regular rate & rhythm and no murmurs and no lower extremity edema ABDOMEN:abdomen soft, mild tenderness in the epigastric area , normal bowel sounds  Musculoskeletal:no cyanosis of digits and no clubbing, (+) mild edema on both legs and both feet, up to knee, L>R, could be due to liver failure PSYCH: alert & oriented x 3 with fluent speech NEURO: no focal motor/sensory deficits. Her left eye ball has limited range of movement  Breasts: exam deferred     LABORATORY DATA:  I have reviewed the data as listed CBC Latest Ref Rng & Units 03/14/2017 03/14/2017 03/08/2017  WBC 4.0 - 10.5 K/uL 1.7(L) 2.3(L) 5.8  Hemoglobin 12.0 - 15.0 g/dL 10.1(L) 9.5(L) 10.7(L)  Hematocrit 36.0 - 46.0 % 27.5(L) 26.4(L) 31.3(L)  Platelets 150 - 400 K/uL 137(L) 135(L) 159    CMP Latest Ref Rng & Units 03/14/2017 03/14/2017 03/08/2017  Glucose 65 - 99 mg/dL 100(H) 132 99  BUN 6 - 20 mg/dL 11 9.1 8.0  Creatinine 0.44 - 1.00 mg/dL 0.48 0.7 0.7  Sodium 135 - 145 mmol/L 132(L) 132(L) 133(L)  Potassium 3.5 - 5.1 mmol/L 3.8 3.7 3.7  Chloride 101 - 111 mmol/L 99(L) - -  CO2 22 - 32 mmol/L 23 25 21(L)  Calcium 8.9 - 10.3 mg/dL 8.2(L) 8.2(L) 8.3(L)  Total Protein 6.5 - 8.1 g/dL 6.6 5.8(L) 5.9(L)  Total Bilirubin 0.3 - 1.2 mg/dL 18.9(HH) 15.79(HH) 14.61(HH)  Alkaline Phos 38 - 126 U/L 309(H)  336(H) 357(H)  AST 15 - 41 U/L 187(H) 184(HH) 214(HH)  ALT 14 - 54 U/L 97(H) 96(H) 100(H)   CA27.29: 06/30/2012: 28 10/30/2014: 355 08/15/2015: 424 12/27/2014: 414 05/05/2016: 416.1 06/10/2016: 332.4 07/01/2016: 348.8 08/03/2016: 388.1 09/01/2016: 321.1 10/26/16: 404.3 11/30/16: 403.7 01/03/17: 815.9 02/07/17: 857.9 02/28/2017: 806.0     PATHOLOGY REPORT  Surgical pathology 03/01/2017 Diagnosis Liver, needle/core biopsy, right lobe - METASTATIC CARCINOMA - SEE COMMENT Microscopic Comment The liver biopsies have an infiltrative cellular process arranged predominantly in small nests. The patient's history of invasive ductal carcinoma is noted. By immunohistochemistry, the neoplastic cells are positive for ER and GATA-3 with patchy weak cytokeratin 7 positivity. Cytokeratin 20 and GCDFP are negative. The morphology and immunophenotype are consistent with metastatic breast carcinoma.   RIGHT BREAST MASS, 6 O'CLOCK, NEEDLE CORE BIOPSIES: 09/03/2008 MICROSCOPIC FOCUS OF DUCTAL CARCINOMA IN SITU ASSOCIATED WITH MICROSCOPIC FOCUS OF MICROINVASION. SEE COMMET.  LYMPH NODE, RIGHT AXILLARY, NEEDLE CORE BIOPSIES: METASTATIC DUCTAL CARCINOMA.  09/24/2008  Diagnosis 05/20/2011 Breast, right, needle core biopsy - INVASIVE DUCTAL CARCINOMA, SEE COMMENT. - LYMPHOVASCULAR INVASION PRESENT. Estrogen Receptor (Negative, <1%): 99%, STRONG STAINING INTENSITY Progesterone Receptor (Negative, <1%): 4%, STRONG HER-2/NEU BY CISH - NO AMPLIFICATION OF HER-2 DETECTED. THE RATIO OF HER-2: CEP 17 SIGNALS WAS 1.38.  Breast, right, needle core biopsy, 6:30 o'clock 12/13/2014 - INVASIVE MAMMARY CARCINOMA. - SEE MICROSCOPIC DESCRIPTION.  Diagnosis 08/01/2015 PLEURAL FLUID, RIGHT(SPECIMEN 1 OF 1 COLLECTED 08/01/15): METASTATIC CARCINOMA CONSISTENT WITH BREAST PRIMARY.   RADIOGRAPHIC STUDIES: I have personally reviewed the radiological images as listed and agreed with the findings in the report.   CT  chest, abdomen and pelvis with contrast 03/14/2017 IMPRESSION: CT  CHEST IMPRESSION:  1. No pulmonary embolus. 2. Increased size of bilateral pleural effusions, now moderate in degree. There is adjacent compressive atelectasis. 3. Thoracic aortic atherosclerosis.  Coronary artery calcifications. 4. Grossly stable osseous metastatic disease. CT ABDOMEN/PELVIS IMPRESSION:  1. Colonic wall thickening from the cecum to the mid transverse colon suspicious for colitis. Mild small bowel thickening of pelvic small bowel loops with edematous appearance, suggesting enteritis. This may be infectious or inflammatory. 2. Heterogeneous appearance of the liver with ill-defined low-density lesions, suspicious for metastatic disease, however no hypermetabolism was seen on recent PET. Gallbladder wall thickening likely secondary to liver disease/ascites. 3. Small volume of abdominopelvic ascites, increased from prior exam. Whole body wall edema has also increased. 4. Aortic atherosclerosis. 5. Grossly stable osseous metastatic disease.   ASSESSMENT & PLAN:   Mrs. Dutil is a 76 y.o. African-American female, who was diagnosed with node positive right breast  Invasive adenocarcinoma in  2009, untreated,  Now presents with low back pain and lumbar spine MRI is highly suspicious for bone metastasis.  1. Abdominal pain and worsening nausea and anorexia  -She was seen in the ED yesterday, CT scan was obtained and I reviewed with patient. -CT scan showed increasing bilateral pleural effusion, probable malignant. Chronic wall thickening, suspicious for colitis. Heterogeneous ill-defined low-density lesions in the liver's, consistent with liver metastasis. -I think her symptoms are related to her diffuse liver metastasis, and colitis. She was prescribed risk Flagyl, I encouraged her field and complete. -I'll hold chemotherapy today -IV fluids today -I give her a prescription of oxycodone and Zofran  2.  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 -Liver MRI on 10/10/16 showed unusual perfusion abnormality involving the liver, but no definite evidence of metastasis. -repeated staging CT scan on 02/01/2017 did not show evidence of liver metastasis, but nodular liver consistent with liver cirrhosis, she has no history of liver disease in the past, hepatitis B and C were negative, she does not drink alcohol much. -Ultrasound abdomen this morning showed nodular liver suspicious for liver cirrhosis. No bili obstruction.  -I previously referred her to GI, abd MRI was done which showed liver cirrhosis, no image evidence of liver metastasis -I previously discussed her restaging PET scan from 02/23/2017, which showed hypermetabolic right breast cancer, otherwise negative, no hypermetabolic lesions in the liver -I have held anastrozole and Ibrance since early May however her liver function continues getting worse,  -Given her underlying metastatic breast cancer, infiltrative liver metastasis is likely,  I strongly recommend patient to have liver biopsy.  - I previously reviewed the liver biopsy results with patient and her daughter, it confirms metastatic cancer from breast. -she has started chemo, However her liver function continues getting worse  3.  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 -she  knows that her disease at this stage is incurable. The goal of treatment is palliative and to prolong her life. -she is on first line anastrozole,  Leslee Home was added on in October 2016 due to slight disease progression. She was not very compliant, off Ibrance for 3-4 months and restarted in 01/2016. -I previously discussed her restaging CT and bone scan from 05/31/2016, which showed diffuse similar appearing bone lesions, no other new metastasis. Bone scan showed mild bilateral rib uptake, but no corresponding findings on the CT. I think she has stable disease overall. -I previously reviewed her restaging CT scan and bone scan from 09/27/2016, which showed stable bone mets. CT scan showed heterogeneous perfusion of the liver indeterminate -I previously reviewed MR abdomen scan from 10/10/2016 with the patient, which showed no evidence of metastatic disease in liver  -She has developed worsening liver function, especially upper bilirubinemia, highly suspicious for diffuse liver metastasis, although this no evidence on the recent MRI or PET -Liver biopsy confirmed diffuse metastasis from breast cancer -She has started the practice of chemotherapy with gemcitabine and carboplatin, tolerated the first week treatment moderately well -The goal of therapy is palliative  -Due to her abdominal pain, probable colitis, I'll hold chemotherapy today.  4. Left orbit bone metastasis  -this was found on her brain MRI in 06/2015 when she had heaache, she has issues with left eye ball movement, was evaluated by Dr. Emilio Math at [facility] -this is likely a metastatic lesion from her breast cancer -I previously discussed her orbital MRI scan findings, which showed diffuse metastases to status is within the left orbital with invading the left extraocular muscles. No optical nerve involvement. -She previously completed treatment with radiation oncologist Dr. Tammi Klippel, who delivered 5 fractions of SRS to her left orbit bone metastasis      5. Low back/ hip pain, Secondary to metastatic cancer to bones  -this has resolved since she started anastrozole -continue Xgeva every 3 months, will monitor  6. Anemia -Likely secondary to her breast cancer, bone metastases and B12 deficiency -Slightly improved after B12 injections. Continue B12 monthly for now. -No need for transfusion. We'll follow up closely. -The patient was previously neutropenic because she took 2 extra doses of Ibrance.   6. Goal of care discussion  -We again discussed incurable nature of her cancer, due to her worsening liver failure, her prognosis is guarded. She is unlikely a candidate for more chemotherapy -The patient understands the goal of care is palliative. -I recommend do not resuscitation. She has agreed DNR/DNI. Her daughter is supportive to her decision.  -we will discuss hospice on her next visit next week if her liver function continue getting worse    Plan:  -Hold chemotherapy today  -She will start Flagyl  -I give her prescription of Zofran and oxycodone  -I'll see her next week   All questions were answered. The patient knows to call the clinic with any problems, questions or concerns.  I spent 20 minutes counseling the patient face to face. The total time spent in the appointment was 30 minutes and more than 50% was on counseling.   Truitt Merle, MD 03/15/2017

## 2017-03-22 ENCOUNTER — Ambulatory Visit (HOSPITAL_BASED_OUTPATIENT_CLINIC_OR_DEPARTMENT_OTHER): Payer: Medicare HMO | Admitting: Hematology

## 2017-03-22 ENCOUNTER — Ambulatory Visit: Payer: Medicare HMO

## 2017-03-22 ENCOUNTER — Other Ambulatory Visit (HOSPITAL_BASED_OUTPATIENT_CLINIC_OR_DEPARTMENT_OTHER): Payer: Medicare HMO

## 2017-03-22 ENCOUNTER — Encounter: Payer: Self-pay | Admitting: Hematology

## 2017-03-22 ENCOUNTER — Telehealth: Payer: Self-pay | Admitting: Hematology

## 2017-03-22 VITALS — BP 132/83 | HR 82 | Temp 97.7°F | Resp 17 | Ht 65.0 in | Wt 182.2 lb

## 2017-03-22 DIAGNOSIS — R740 Nonspecific elevation of levels of transaminase and lactic acid dehydrogenase [LDH]: Secondary | ICD-10-CM

## 2017-03-22 DIAGNOSIS — C7951 Secondary malignant neoplasm of bone: Secondary | ICD-10-CM | POA: Diagnosis not present

## 2017-03-22 DIAGNOSIS — C50511 Malignant neoplasm of lower-outer quadrant of right female breast: Secondary | ICD-10-CM

## 2017-03-22 DIAGNOSIS — Z170 Estrogen receptor positive status [ER+]: Principal | ICD-10-CM

## 2017-03-22 DIAGNOSIS — K7200 Acute and subacute hepatic failure without coma: Secondary | ICD-10-CM

## 2017-03-22 DIAGNOSIS — D649 Anemia, unspecified: Secondary | ICD-10-CM

## 2017-03-22 DIAGNOSIS — C50911 Malignant neoplasm of unspecified site of right female breast: Secondary | ICD-10-CM

## 2017-03-22 DIAGNOSIS — I10 Essential (primary) hypertension: Secondary | ICD-10-CM

## 2017-03-22 DIAGNOSIS — D630 Anemia in neoplastic disease: Secondary | ICD-10-CM

## 2017-03-22 DIAGNOSIS — C787 Secondary malignant neoplasm of liver and intrahepatic bile duct: Secondary | ICD-10-CM

## 2017-03-22 LAB — CBC WITH DIFFERENTIAL/PLATELET
BASO%: 1.2 % (ref 0.0–2.0)
Basophils Absolute: 0.1 10*3/uL (ref 0.0–0.1)
EOS%: 0.1 % (ref 0.0–7.0)
Eosinophils Absolute: 0 10*3/uL (ref 0.0–0.5)
HCT: 27.4 % — ABNORMAL LOW (ref 34.8–46.6)
HGB: 9.8 g/dL — ABNORMAL LOW (ref 11.6–15.9)
LYMPH%: 16 % (ref 14.0–49.7)
MCH: 29.9 pg (ref 25.1–34.0)
MCHC: 35.8 g/dL (ref 31.5–36.0)
MCV: 83.5 fL (ref 79.5–101.0)
MONO#: 0.7 10*3/uL (ref 0.1–0.9)
MONO%: 10 % (ref 0.0–14.0)
NEUT#: 5.4 10*3/uL (ref 1.5–6.5)
NEUT%: 72.7 % (ref 38.4–76.8)
Platelets: ADEQUATE 10*3/uL (ref 145–400)
RBC: 3.28 10*6/uL — AB (ref 3.70–5.45)
RDW: 18.7 % — ABNORMAL HIGH (ref 11.2–14.5)
WBC: 7.4 10*3/uL (ref 3.9–10.3)
lymph#: 1.2 10*3/uL (ref 0.9–3.3)
nRBC: 3 % — ABNORMAL HIGH (ref 0–0)

## 2017-03-22 LAB — COMPREHENSIVE METABOLIC PANEL
ALT: 67 U/L — ABNORMAL HIGH (ref 0–55)
AST: 164 U/L — AB (ref 5–34)
Albumin: 2.1 g/dL — ABNORMAL LOW (ref 3.5–5.0)
Alkaline Phosphatase: 368 U/L — ABNORMAL HIGH (ref 40–150)
Anion Gap: 13 mEq/L — ABNORMAL HIGH (ref 3–11)
BUN: 12.6 mg/dL (ref 7.0–26.0)
CHLORIDE: 98 meq/L (ref 98–109)
CO2: 22 mEq/L (ref 22–29)
Calcium: 8.7 mg/dL (ref 8.4–10.4)
Creatinine: 0.7 mg/dL (ref 0.6–1.1)
EGFR: >90 mL/min/{1.73_m2} (ref >90)
GLUCOSE: 94 mg/dL (ref 70–140)
POTASSIUM: 2.8 meq/L — AB (ref 3.5–5.1)
SODIUM: 133 meq/L — AB (ref 136–145)
Total Bilirubin: 20.22 mg/dL (ref 0.20–1.20)
Total Protein: 6.4 g/dL (ref 6.4–8.3)

## 2017-03-22 LAB — TECHNOLOGIST REVIEW

## 2017-03-22 MED ORDER — LACTULOSE 10 GM/15ML PO SOLN: 10.0000 g | Freq: Two times a day (BID) | ORAL | 2 refills | Status: AC | PRN

## 2017-03-22 MED ORDER — POTASSIUM CHLORIDE CRYS ER 20 MEQ PO TBCR: 20.0000 meq | EXTENDED_RELEASE_TABLET | Freq: Two times a day (BID) | ORAL | 1 refills | Status: AC

## 2017-03-22 MED ORDER — OMEPRAZOLE 40 MG PO CPDR: 40.0000 mg | DELAYED_RELEASE_CAPSULE | Freq: Every day | ORAL | 2 refills | Status: AC

## 2017-03-22 MED ORDER — OMEPRAZOLE 40 MG PO CPDR: 40.0000 mg | DELAYED_RELEASE_CAPSULE | Freq: Every day | ORAL | 2 refills | Status: DC

## 2017-03-22 MED FILL — OMEPRAZOLE DR 40 MG CAPSULE: 40 | 30 days supply | Qty: 30 | Fill #0

## 2017-03-22 MED FILL — POTASSIUM CL ER 20 MEQ TAB: 20 | 30 days supply | Qty: 60 | Fill #0

## 2017-03-22 MED FILL — LACTULOSE 10 GM/15 ML SOLN: 10 | 8 days supply | Qty: 240 | Fill #0

## 2017-03-22 NOTE — Telephone Encounter (Signed)
Gave patient avs report and appointments for June. Spoke with desk nurse to confirm YF stopping treatment per dtr. Desk nurse will get back to me.

## 2017-03-28 ENCOUNTER — Other Ambulatory Visit: Payer: Medicare HMO

## 2017-03-28 ENCOUNTER — Ambulatory Visit: Payer: Medicare HMO | Admitting: Hematology

## 2017-03-29 ENCOUNTER — Other Ambulatory Visit (HOSPITAL_BASED_OUTPATIENT_CLINIC_OR_DEPARTMENT_OTHER): Payer: Medicare HMO | Admitting: *Deleted

## 2017-03-29 ENCOUNTER — Ambulatory Visit: Payer: Medicare HMO

## 2017-03-29 ENCOUNTER — Other Ambulatory Visit (HOSPITAL_BASED_OUTPATIENT_CLINIC_OR_DEPARTMENT_OTHER): Payer: Medicare HMO

## 2017-03-29 DIAGNOSIS — C50511 Malignant neoplasm of lower-outer quadrant of right female breast: Secondary | ICD-10-CM

## 2017-03-29 DIAGNOSIS — Z170 Estrogen receptor positive status [ER+]: Secondary | ICD-10-CM | POA: Diagnosis not present

## 2017-03-29 DIAGNOSIS — E860 Dehydration: Secondary | ICD-10-CM

## 2017-03-29 LAB — CBC WITH DIFFERENTIAL/PLATELET
BASO%: 0.1 % (ref 0.0–2.0)
Basophils Absolute: 0 10*3/uL (ref 0.0–0.1)
EOS%: 0.1 % (ref 0.0–7.0)
Eosinophils Absolute: 0 10*3/uL (ref 0.0–0.5)
HEMATOCRIT: 25.3 % — AB (ref 34.8–46.6)
HGB: 8.7 g/dL — ABNORMAL LOW (ref 11.6–15.9)
LYMPH%: 8.3 % — AB (ref 14.0–49.7)
MCH: 30 pg (ref 25.1–34.0)
MCHC: 34.4 g/dL (ref 31.5–36.0)
MCV: 87.2 fL (ref 79.5–101.0)
MONO#: 0.9 10*3/uL (ref 0.1–0.9)
MONO%: 8.4 % (ref 0.0–14.0)
NEUT%: 83.1 % — AB (ref 38.4–76.8)
NEUTROS ABS: 8.9 10*3/uL — AB (ref 1.5–6.5)
Platelets: 287 10*3/uL (ref 145–400)
RBC: 2.9 10*6/uL — AB (ref 3.70–5.45)
RDW: 22.5 % — ABNORMAL HIGH (ref 11.2–14.5)
WBC: 10.7 10*3/uL — AB (ref 3.9–10.3)
lymph#: 0.9 10*3/uL (ref 0.9–3.3)
nRBC: 1 % — ABNORMAL HIGH (ref 0–0)

## 2017-03-29 LAB — COMPREHENSIVE METABOLIC PANEL
ALT: 75 U/L — AB (ref 0–55)
ANION GAP: 10 meq/L (ref 3–11)
AST: 140 U/L — AB (ref 5–34)
Albumin: 1.9 g/dL — ABNORMAL LOW (ref 3.5–5.0)
Alkaline Phosphatase: 369 U/L — ABNORMAL HIGH (ref 40–150)
BUN: 10.9 mg/dL (ref 7.0–26.0)
CHLORIDE: 98 meq/L (ref 98–109)
CO2: 23 meq/L (ref 22–29)
Calcium: 8.6 mg/dL (ref 8.4–10.4)
Creatinine: 0.7 mg/dL (ref 0.6–1.1)
EGFR: >90 mL/min/{1.73_m2} (ref >90)
Glucose: 115 mg/dl (ref 70–140)
POTASSIUM: 3.2 meq/L — AB (ref 3.5–5.1)
Sodium: 131 mEq/L — ABNORMAL LOW (ref 136–145)
Total Bilirubin: 19.78 mg/dL (ref 0.20–1.20)
Total Protein: 6.1 g/dL — ABNORMAL LOW (ref 6.4–8.3)

## 2017-03-29 MED ORDER — SODIUM CHLORIDE 0.9 % IV SOLN
Freq: Once | INTRAVENOUS | Status: AC
  Administered 2017-03-29: 11:00:00 via INTRAVENOUS

## 2017-03-29 NOTE — Patient Instructions (Signed)
Dehydration, Adult Dehydration is when there is not enough fluid or water in your body. This happens when you lose more fluids than you take in. Dehydration can range from mild to very bad. It should be treated right away to keep it from getting very bad. Symptoms of mild dehydration may include:  Thirst.  Dry lips.  Slightly dry mouth.  Dry, warm skin.  Dizziness. Symptoms of moderate dehydration may include:  Very dry mouth.  Muscle cramps.  Dark pee (urine). Pee may be the color of tea.  Your body making less pee.  Your eyes making fewer tears.  Heartbeat that is uneven or faster than normal (palpitations).  Headache.  Light-headedness, especially when you stand up from sitting.  Fainting (syncope). Symptoms of very bad dehydration may include:  Changes in skin, such as: ? Cold and clammy skin. ? Blotchy (mottled) or pale skin. ? Skin that does not quickly return to normal after being lightly pinched and let go (poor skin turgor).  Changes in body fluids, such as: ? Feeling very thirsty. ? Your eyes making fewer tears. ? Not sweating when body temperature is high, such as in hot weather. ? Your body making very little pee.  Changes in vital signs, such as: ? Weak pulse. ? Pulse that is more than 100 beats a minute when you are sitting still. ? Fast breathing. ? Low blood pressure.  Other changes, such as: ? Sunken eyes. ? Cold hands and feet. ? Confusion. ? Lack of energy (lethargy). ? Trouble waking up from sleep. ? Short-term weight loss. ? Unconsciousness. Follow these instructions at home:  If told by your doctor, drink an ORS: ? Make an ORS by using instructions on the package. ? Start by drinking small amounts, about  cup (120 mL) every 5-10 minutes. ? Slowly drink more until you have had the amount that your doctor said to have.  Drink enough clear fluid to keep your pee clear or pale yellow. If you were told to drink an ORS, finish the ORS  first, then start slowly drinking clear fluids. Drink fluids such as: ? Water. Do not drink only water by itself. Doing that can make the salt (sodium) level in your body get too low (hyponatremia). ? Ice chips. ? Fruit juice that you have added water to (diluted). ? Low-calorie sports drinks.  Avoid: ? Alcohol. ? Drinks that have a lot of sugar. These include high-calorie sports drinks, fruit juice that does not have water added, and soda. ? Caffeine. ? Foods that are greasy or have a lot of fat or sugar.  Take over-the-counter and prescription medicines only as told by your doctor.  Do not take salt tablets. Doing that can make the salt level in your body get too high (hypernatremia).  Eat foods that have minerals (electrolytes). Examples include bananas, oranges, potatoes, tomatoes, and spinach.  Keep all follow-up visits as told by your doctor. This is important. Contact a doctor if:  You have belly (abdominal) pain that: ? Gets worse. ? Stays in one area (localizes).  You have a rash.  You have a stiff neck.  You get angry or annoyed more easily than normal (irritability).  You are more sleepy than normal.  You have a harder time waking up than normal.  You feel: ? Weak. ? Dizzy. ? Very thirsty.  You have peed (urinated) only a small amount of very dark pee during 6-8 hours. Get help right away if:  You have symptoms of   very bad dehydration.  You cannot drink fluids without throwing up (vomiting).  Your symptoms get worse with treatment.  You have a fever.  You have a very bad headache.  You are throwing up or having watery poop (diarrhea) and it: ? Gets worse. ? Does not go away.  You have blood or something green (bile) in your throw-up.  You have blood in your poop (stool). This may cause poop to look black and tarry.  You have not peed in 6-8 hours.  You pass out (faint).  Your heart rate when you are sitting still is more than 100 beats a  minute.  You have trouble breathing. This information is not intended to replace advice given to you by your health care provider. Make sure you discuss any questions you have with your health care provider. Document Released: 07/24/2009 Document Revised: 04/16/2016 Document Reviewed: 11/21/2015 Elsevier Interactive Patient Education  2018 Elsevier Inc.  

## 2017-03-29 NOTE — Progress Notes (Signed)
Pt came today for labs.  Chemo appt cancelled as per Dr. Ernestina Penna last office notes.  Pt misunderstood and thought she was to see NP today.  Pt has appt with NP on Tues  6/26.   Both daughters expressed concerns that pt will need to receive IVF today while here at the clinic.   Dr. Benay Spice notified.  Verbal order received for 500 ml normal saline to be given today as per md.  Explanations given to pt and daughters. Dr. Benay Spice reviewed CMET results today.   Per pt, she has not been taking Kdur as prescribed.  Stated takes 1 tablet daily when pt remembers.  Reinforced the importance of taking meds as prescribed.  Both daughters verbalized understanding, and stated they would help pt with medications management.. Pt was stable at discharge via wheelchair with daughters.

## 2017-03-30 LAB — CANCER ANTIGEN 27.29: CA 27.29: 408.5 U/mL — ABNORMAL HIGH (ref 0.0–38.6)

## 2017-03-31 ENCOUNTER — Other Ambulatory Visit: Payer: Self-pay | Admitting: Hematology

## 2017-04-04 NOTE — Progress Notes (Signed)
[facility] Follow up:    [name], MD [place] [id number]   DIAGNOSIS: Cancer Staging No matching staging information was found for the patient.  SUMMARY OF ONCOLOGIC HISTORY:   Breast cancer of lower-outer quadrant of right female breast ([place])   09/03/2008 Initial Diagnosis    right breast cancer, T2N1Mo, stage IIA, ER+, PR+, HER2+ (ration 2.07). Pt declined surgery or other treatment.        05/20/2011 Tumor Marker    right breast mass biopsy showed IDA, ER+/PR+/HER2- (ratio 1.37)      09/04/2011 Cancer Staging    PET scan showed no distant mets      10/08/2014 Progression     lumbar spine MRI showed multiple bone metastasis , highly suspicious for  metastatic disease.      12/13/2014 Pathology Results    Right breast needle biopsy showed invasive ductal carcinoma, ER 100% positive, PR 2% positive, HER-2 negative with ratio 1.45 and copy number 2.25      12/14/2014 - 02/08/2017 Anti-estrogen oral therapy    Arimidex 38m daily, Ibrance added on 07/29/2015, stopped in 08/2015 after loss of f/u, and restarted on 01/10/2016, stopped due to worsening liver failure and disease progression.      07/17/2015 Imaging    PET scan showed overall similar appearance of multifocal right breast cancer with extensive bone metastasis. Increased size of right pleural effusion, indeterminate for malignant involvement of the pleural fluids.      01/20/2016 Imaging    CT CAP with contrast showed small bilateral pleural effusion, right greater than left, similar osseous metastasis, no evidence of extraosseous metastasis. Bone scan showed no areas of suspicious uptake.      09/27/2016 Imaging    CT CAP w contrast 1. Heterogeneous perfusion of the liver identified on today's study with some areas of hypoattenuating nodularity. This raises concern for development of liver metastases although this is not a definite finding today may simply reflect  heterogeneous perfusion. MRI of the abdomen without and with contrast would prove helpful to further evaluate, as clinically warranted. 2. Imaging features suggest bony metastatic disease, stable in the interval. Given lack of uptake on bone scan performed earlier today, these bony changes likely reflect treated disease. 3. Persistent marked skin thickening and edema diffusely in the right breast tracking into the region of the right axilla. Not substantially changed in the interval. 4. Interval resolution of small bilateral pleural effusions with posterior right lower lobe collapse/consolidation.      10/10/2016 Imaging    MR Abdomen w/wo contrast 1. Unusual perfusion abnormality involving the liver could be related to prior radiation effect but I do not see any MR findings suspicious for hepatic metastatic disease. The portal and hepatic veins are patent. 2. Stable surgical and radiation changes involving the right breast. No significant change in 21.5 mm irregular enhancing right lateral breast lesion. 3. Diffuse osseous metastatic disease.      10/13/2016 - 10/22/2016 Radiation Therapy    SRS treatment 5 fractions to the left orbit bone metastasis with Dr. MTammi Klippel      02/01/2017 Imaging    CT CAP  IMPRESSION: Chest Impression: 1. No evidence of soft tissue metastasis in thorax. 2. Bilateral pleural effusions are increased. 3. Focus of round atelectasis RIGHT lung base.  Abdomen / Pelvis Impression: 1. No evidence of metastasis in soft tissue. 2. Nodular liver consistent with cirrhosis. 3. Diffuse sclerotic skeletal metastasis.  02/02/2017 Imaging    Bone Scan IMPRESSION: 1. Several vague areas of increased activity in the ribs primarily the left anterior 6th or seventh rib but no CT correlate is seen to indicate definite metastatic lesion. Early metastatic lesions cannot be excluded. 2. Increased activity along the right parietal calvarium could indicate  metastatic involvement. 3. A focus of sclerosis within the right iliac bone by yesterday's CT does not demonstrate increased activity on bone scan.      02/18/2017 Imaging    MRI Abdomen 02/18/17 IMPRESSION: 1. Diffuse osseous metastatic disease, similar prior exams. 2. Diffuse abnormal signal throughout the liver probably representing a combination of macronodular hepatic cirrhosis with extensive fibrosis. The fibrotic portions are interspersed but are more prominent peripherally in the liver. No early arterial phase masslike enhancement to suggest hepatocellular carcinoma. Occasionally diffuse hepatic metastatic disease can cause a similar complex pattern, although cirrhosis is favored in this case. Sensitivity for smaller metastatic lesions to the liver is reduced due to the severity of the underlying global signal abnormality in the liver. 3. Considerable gallbladder wall thickening, although this may well be due to hypoproteinemia/hypoalbuminemia rather than necessarily being from inflammation. There is third spacing of fluid in the subcutaneous and mesenteric tissues which also suggests third spacing of fluid. 4. No biliary dilatation. 5. Chronic appearance of round atelectasis in the right lower lobe, potentially with tethering to a focus of pleural thickening and mild pleural enhancement as has been shown on multiple prior exams. Moderate to large right and moderate left pleural effusion.      02/23/2017 PET scan    PET 02/23/17 IMPRESSION: 1. Similar appearance of abnormal asymmetric skin thickening and soft tissue infiltration involving the right breast compatible with known multifocal breast cancer. Some of these changes may reflect treatment related changes. 2. No hypermetabolic soft tissue mass or adenopathy identified. 3. There is a focal area of abnormal asymmetric increased uptake localizing to the anterior aspect of the left sixth rib. If there is a recent history  of trauma to this area findings may reflect posttraumatic uptake. Metabolically active focal bone metastasis not excluded. Clinical correlation advise. 4. Persistent bilateral pleural effusions. 5. Aortic atherosclerosis        02/28/2017 Tumor Marker    ca27.29 is 806.0       03/01/2017 Imaging    [place] of liver  03/01/2017 IMPRESSION: No discrete lesions identified within the liver by ultrasound.      03/01/2017 Pathology Results    Hassan Rowan biopsy of the right lobe liver showed metastatic carcinoma, immunohistochemistry staining are positive for ER and GATA-3 with patchy week cytokeratin 7 positivity. CK 20 and GCDFP are negative. The morphology and immunophenotype are consistent with metastatic breast cancer.      03/08/2017 -  Chemotherapy    Chemo carboplatin AUC 4 on day 1 (may increase to 5 from cycle 2 if tolerates well) and gemcitabine 871m/m2 on day 1, 8 every 21 days, starting on 5/29        03/15/2017 Imaging    IMPRESSION: CT CHEST IMPRESSION:  1. No pulmonary embolus. 2. Increased size of bilateral pleural effusions, now moderate in degree. There is adjacent compressive atelectasis. 3. Thoracic aortic atherosclerosis.  Coronary artery calcifications. 4. Grossly stable osseous metastatic disease.  CT ABDOMEN/PELVIS IMPRESSION:  1. Colonic wall thickening from the cecum to the mid transverse colon suspicious for colitis. Mild small bowel thickening of pelvic small bowel loops with edematous appearance, suggesting enteritis. This may be infectious or inflammatory.  2. Heterogeneous appearance of the liver with ill-defined low-density lesions, suspicious for metastatic disease, however no hypermetabolism was seen on recent PET. Gallbladder wall thickening likely secondary to liver disease/ascites. 3. Small volume of abdominopelvic ascites, increased from prior exam. Whole body wall edema has also increased. 4. Aortic atherosclerosis. 5. Grossly stable osseous  metastatic disease      03/15/2017 Imaging     IMPRESSION: CT CHEST IMPRESSION:  1. No pulmonary embolus. 2. Increased size of bilateral pleural effusions, now moderate in degree. There is adjacent compressive atelectasis. 3. Thoracic aortic atherosclerosis.  Coronary artery calcifications. 4. Grossly stable osseous metastatic disease. CT ABDOMEN/PELVIS IMPRESSION:  1. Colonic wall thickening from the cecum to the mid transverse colon suspicious for colitis. Mild small bowel thickening of pelvic small bowel loops with edematous appearance, suggesting enteritis. This may be infectious or inflammatory. 2. Heterogeneous appearance of the liver with ill-defined low-density lesions, suspicious for metastatic disease, however no hypermetabolism was seen on recent PET. Gallbladder wall thickening likely secondary to liver disease/ascites. 3. Small volume of abdominopelvic ascites, increased from prior exam. Whole body wall edema has also increased. 4. Aortic atherosclerosis. 5. Grossly stable osseous metastatic disease.       CURRENT THERAPY: palliative care  INTERVAL HISTORY: Margaret Rubio 76 y.o. female returns for evaluation prior to receiving iv fluids.  She met with in home hospice earlier today.  She is here with her daughter for evaluation.  She is not in pain.  Her bilirubin is 20 and she is not confused.  She denies any abdominal bloating or further issues.  Her daughter is in the process of moving in with her mom from [place].     Patient Active Problem List   Diagnosis Date Noted  . Abdominal pain 03/18/2017  . Liver metastasis ([place]) 03/13/2017  . Liver failure ([place]) 03/05/2017  . Goals of care, counseling/discussion 10/26/2016  . 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 Left eye pain 05/19/2016  . Left-sided face pain 05/19/2016  . Peripheral edema 03/29/2016  . Acute URI 01/15/2016  . 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 Pleural effusion, right 11/04/2015   . 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 Encounter for chemotherapy management 07/31/2015  . Broken tooth 05/27/2015  . Left lumbar radiculopathy 10/08/2014  . Vitamin D deficiency 08/21/2014  . Unspecified vitamin D deficiency 09/26/2013  . Primary localized osteoarthrosis, lower leg 09/26/2013  . Right knee pain 12/19/2012  . Preventative health care 05/18/2011  . Vitamin B12 deficiency 05/23/2010  . MENOPAUSAL DISORDER 05/22/2010  . SHOULDER PAIN, LEFT 11/14/2009  . FATIGUE 11/14/2009  . Breast cancer of lower-outer quadrant of right female breast ([place]) 11/14/2009  . Cough 10/17/2009  . GANGLION CYST 05/01/2009  . Rash 05/01/2009  . Pain in joint, lower leg 05/10/2008  . ASTHMA 01/31/2008  . HYPERLIPIDEMIA 05/21/2007  . LOW BACK PAIN 05/21/2007  . Iron deficiency anemia 05/05/2007  . Essential hypertension 05/05/2007    is allergic to asa [aspirin] and tomato.  MEDICAL HISTORY: Past Medical History:  Diagnosis Date  . 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 ASTHMA 01/31/2008  . Breast cancer [facility]) dx 2009   right breast  . BREAST CANCER, HX OF 11/14/2009  . BRONCHITIS, ACUTE 01/31/2008  . Cough 10/17/2009  . Dyspnea    increased exertion   . FATIGUE 11/14/2009  . GANGLION CYST 05/01/2009  . History of blood transfusion   . HYPERLIPIDEMIA 05/21/2007  . HYPERTENSION 05/05/2007  . LOW BACK PAIN  05/21/2007  . MENOPAUSAL DISORDER 05/22/2010  . Pain in joint, lower leg 05/10/2008  . RASH-NONVESICULAR 05/01/2009  . SHOULDER PAIN, LEFT 11/14/2009  . 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 VITAMIN B12 DEFICIENCY 05/23/2010    SURGICAL HISTORY: Past Surgical History:  Procedure Laterality Date  . OTHER SURGICAL HISTORY     fibroid removal  . removal of neck lump     benigh 1986    SOCIAL HISTORY: Social History   Social History  . Marital status: Widowed    Spouse name: N/A  . Number of children: 1  . Years of education: N/A   Occupational History  . Not on file.    Social History Main Topics  . Smoking status: Never Smoker  . Smokeless tobacco: Never Used  . Alcohol use No  . Drug use: No  . Sexual activity: No   Other Topics Concern  . Not on file   Social History Narrative  . No narrative on file    FAMILY HISTORY: Family History  Problem Relation Age of Onset  . Hypertension Father   . Heart disease Father   . Asthma Other   . Diabetes Other   . Hypertension Other   . Cancer Paternal Aunt        leukemia ?  Marland Kitchen Colon cancer Neg Hx   . Esophageal cancer Neg Hx   . Pancreatic cancer Neg Hx   . Stomach cancer Neg Hx   . Liver disease Neg Hx     Review of Systems  Constitutional: Negative for appetite change, chills, fatigue, fever and unexpected weight change.  HENT:   Negative for hearing loss and lump/mass.   Eyes: Negative for eye problems and icterus.  Respiratory: Negative for chest tightness, cough, shortness of breath and wheezing.   Cardiovascular: Negative for chest pain, leg swelling and palpitations.  Gastrointestinal: Negative for abdominal distention, abdominal pain, constipation, diarrhea, nausea and vomiting.  Endocrine: Negative for hot flashes.  Genitourinary: Negative for difficulty urinating and dyspareunia.   Musculoskeletal: Negative for arthralgias.  Skin: Negative for itching and rash.  Neurological: Negative for dizziness, extremity weakness and numbness.  Hematological: Negative for adenopathy. Does not bruise/bleed easily.  Psychiatric/Behavioral: Negative for depression. The patient is not nervous/anxious.       PHYSICAL EXAMINATION  ECOG PERFORMANCE STATUS: 2 - Symptomatic, <50% confined to bed  Vitals:   04/05/17 1404  BP: 110/68  Pulse: 71  Resp: 18    Physical Exam  Constitutional: She is oriented to person, place, and time and well-developed, well-nourished, and in no distress.  HENT:  Head: Normocephalic and atraumatic.  Mouth/Throat: Oropharynx is clear and moist. No  oropharyngeal exudate.  Eyes: Pupils are equal, round, and reactive to light. No scleral icterus.  Neck: Neck supple.  Cardiovascular: Normal rate, regular rhythm and normal heart sounds.   Pulmonary/Chest: Effort normal and breath sounds normal. No respiratory distress. She has no wheezes.  Abdominal: Soft. Bowel sounds are normal. She exhibits no distension. There is no tenderness.  Musculoskeletal: She exhibits no edema.  Lymphadenopathy:    She has no cervical adenopathy.  Neurological: She is alert and oriented to person, place, and time.  Skin: Skin is warm and dry. No rash noted. No erythema.  Psychiatric: Mood and affect normal.    LABORATORY DATA:  CBC    Component Value Date/Time   WBC 10.7 (H) 03/29/2017 1008   WBC 1.7 (L) 03/14/2017 2219   RBC 2.90 (L) 03/29/2017 1008   RBC  3.34 (L) 03/14/2017 2219   HGB 8.7 (L) 03/29/2017 1008   HCT 25.3 (L) 03/29/2017 1008   PLT 287 03/29/2017 1008   MCV 87.2 03/29/2017 1008   MCH 30.0 03/29/2017 1008   MCH 30.2 03/14/2017 2219   MCHC 34.4 03/29/2017 1008   MCHC 36.7 (H) 03/14/2017 2219   RDW 22.5 (H) 03/29/2017 1008   LYMPHSABS 0.9 03/29/2017 1008   MONOABS 0.9 03/29/2017 1008   EOSABS 0.0 03/29/2017 1008   EOSABS 0.1 06/30/2012 1139   BASOSABS 0.0 03/29/2017 1008    CMP     Component Value Date/Time   NA 131 (L) 03/29/2017 1008   K 3.2 (L) 03/29/2017 1008   CL 99 (L) 03/14/2017 2219   CO2 23 03/29/2017 1008   GLUCOSE 115 03/29/2017 1008   GLUCOSE 90 09/16/2006 0933   BUN 10.9 03/29/2017 1008   CREATININE 0.7 03/29/2017 1008   CALCIUM 8.6 03/29/2017 1008   PROT 6.1 (L) 03/29/2017 1008   ALBUMIN 1.9 (L) 03/29/2017 1008   AST 140 (H) 03/29/2017 1008   ALT 75 (H) 03/29/2017 1008   ALKPHOS 369 (H) 03/29/2017 1008   BILITOT 19.78 (HH) 03/29/2017 1008   GFRNONAA >60 03/14/2017 2219   GFRAA >60 03/14/2017 2219       ASSESSMENT and PLAN:   Breast cancer of lower-outer quadrant of right female breast ([place]) Laylaa  is no longer receiving chemotherapy and her liver enzymes are increasing.  The nurses could not get any labs on her today.  She received a small amt of IV fluids today in clinic since her PO intake has been decreased.  She will return in one week for f/u with Dr. Burr Medico.  I again reviewed hospice and palliative care with them in detail.      All questions were answered. The patient knows to call the clinic with any problems, questions or concerns. We can certainly see the patient much sooner if necessary.  A total of (30) minutes of face-to-face time was spent with this patient with greater than 50% of that time in counseling and care-coordination.  This note was electronically signed. Scot Dock, NP 04/05/2017

## 2017-04-05 ENCOUNTER — Other Ambulatory Visit: Payer: Medicare HMO

## 2017-04-05 ENCOUNTER — Ambulatory Visit (HOSPITAL_BASED_OUTPATIENT_CLINIC_OR_DEPARTMENT_OTHER): Payer: Medicare HMO | Admitting: Adult Health

## 2017-04-05 ENCOUNTER — Ambulatory Visit (HOSPITAL_BASED_OUTPATIENT_CLINIC_OR_DEPARTMENT_OTHER): Payer: Medicare HMO

## 2017-04-05 ENCOUNTER — Other Ambulatory Visit: Payer: Self-pay | Admitting: *Deleted

## 2017-04-05 ENCOUNTER — Encounter: Payer: Self-pay | Admitting: Adult Health

## 2017-04-05 ENCOUNTER — Telehealth: Payer: Self-pay

## 2017-04-05 VITALS — BP 110/68 | HR 71 | Resp 18 | Ht 65.0 in | Wt 180.8 lb

## 2017-04-05 VITALS — BP 120/74 | HR 67 | Resp 18

## 2017-04-05 DIAGNOSIS — E538 Deficiency of other specified B group vitamins: Secondary | ICD-10-CM

## 2017-04-05 DIAGNOSIS — R600 Localized edema: Secondary | ICD-10-CM

## 2017-04-05 DIAGNOSIS — C50511 Malignant neoplasm of lower-outer quadrant of right female breast: Secondary | ICD-10-CM

## 2017-04-05 DIAGNOSIS — C7951 Secondary malignant neoplasm of bone: Secondary | ICD-10-CM | POA: Diagnosis not present

## 2017-04-05 DIAGNOSIS — R638 Other symptoms and signs concerning food and fluid intake: Secondary | ICD-10-CM

## 2017-04-05 DIAGNOSIS — R748 Abnormal levels of other serum enzymes: Secondary | ICD-10-CM | POA: Diagnosis not present

## 2017-04-05 DIAGNOSIS — Z170 Estrogen receptor positive status [ER+]: Secondary | ICD-10-CM | POA: Diagnosis not present

## 2017-04-05 MED ORDER — SODIUM CHLORIDE 0.9 % IV SOLN
Freq: Once | INTRAVENOUS | Status: AC
  Administered 2017-04-05: 16:00:00 via INTRAVENOUS

## 2017-04-05 NOTE — Patient Instructions (Signed)
Dehydration, Adult Dehydration is when there is not enough fluid or water in your body. This happens when you lose more fluids than you take in. Dehydration can range from mild to very bad. It should be treated right away to keep it from getting very bad. Symptoms of mild dehydration may include:  Thirst.  Dry lips.  Slightly dry mouth.  Dry, warm skin.  Dizziness. Symptoms of moderate dehydration may include:  Very dry mouth.  Muscle cramps.  Dark pee (urine). Pee may be the color of tea.  Your body making less pee.  Your eyes making fewer tears.  Heartbeat that is uneven or faster than normal (palpitations).  Headache.  Light-headedness, especially when you stand up from sitting.  Fainting (syncope). Symptoms of very bad dehydration may include:  Changes in skin, such as: ? Cold and clammy skin. ? Blotchy (mottled) or pale skin. ? Skin that does not quickly return to normal after being lightly pinched and let go (poor skin turgor).  Changes in body fluids, such as: ? Feeling very thirsty. ? Your eyes making fewer tears. ? Not sweating when body temperature is high, such as in hot weather. ? Your body making very little pee.  Changes in vital signs, such as: ? Weak pulse. ? Pulse that is more than 100 beats a minute when you are sitting still. ? Fast breathing. ? Low blood pressure.  Other changes, such as: ? Sunken eyes. ? Cold hands and feet. ? Confusion. ? Lack of energy (lethargy). ? Trouble waking up from sleep. ? Short-term weight loss. ? Unconsciousness. Follow these instructions at home:  If told by your doctor, drink an ORS: ? Make an ORS by using instructions on the package. ? Start by drinking small amounts, about  cup (120 mL) every 5-10 minutes. ? Slowly drink more until you have had the amount that your doctor said to have.  Drink enough clear fluid to keep your pee clear or pale yellow. If you were told to drink an ORS, finish the ORS  first, then start slowly drinking clear fluids. Drink fluids such as: ? Water. Do not drink only water by itself. Doing that can make the salt (sodium) level in your body get too low (hyponatremia). ? Ice chips. ? Fruit juice that you have added water to (diluted). ? Low-calorie sports drinks.  Avoid: ? Alcohol. ? Drinks that have a lot of sugar. These include high-calorie sports drinks, fruit juice that does not have water added, and soda. ? Caffeine. ? Foods that are greasy or have a lot of fat or sugar.  Take over-the-counter and prescription medicines only as told by your doctor.  Do not take salt tablets. Doing that can make the salt level in your body get too high (hypernatremia).  Eat foods that have minerals (electrolytes). Examples include bananas, oranges, potatoes, tomatoes, and spinach.  Keep all follow-up visits as told by your doctor. This is important. Contact a doctor if:  You have belly (abdominal) pain that: ? Gets worse. ? Stays in one area (localizes).  You have a rash.  You have a stiff neck.  You get angry or annoyed more easily than normal (irritability).  You are more sleepy than normal.  You have a harder time waking up than normal.  You feel: ? Weak. ? Dizzy. ? Very thirsty.  You have peed (urinated) only a small amount of very dark pee during 6-8 hours. Get help right away if:  You have symptoms of   very bad dehydration.  You cannot drink fluids without throwing up (vomiting).  Your symptoms get worse with treatment.  You have a fever.  You have a very bad headache.  You are throwing up or having watery poop (diarrhea) and it: ? Gets worse. ? Does not go away.  You have blood or something green (bile) in your throw-up.  You have blood in your poop (stool). This may cause poop to look black and tarry.  You have not peed in 6-8 hours.  You pass out (faint).  Your heart rate when you are sitting still is more than 100 beats a  minute.  You have trouble breathing. This information is not intended to replace advice given to you by your health care provider. Make sure you discuss any questions you have with your health care provider. Document Released: 07/24/2009 Document Revised: 04/16/2016 Document Reviewed: 11/21/2015 Elsevier Interactive Patient Education  2018 Elsevier Inc.  

## 2017-04-05 NOTE — Assessment & Plan Note (Signed)
Margaret Rubio is no longer receiving chemotherapy and her liver enzymes are increasing.  The nurses could not get any labs on her today.  She received a small amt of IV fluids today in clinic since her PO intake has been decreased.  She will return in one week for f/u with Dr. Burr Medico.  I again reviewed hospice and palliative care with them in detail.

## 2017-04-05 NOTE — Progress Notes (Unsigned)
Attempted to obtain venous access to obtain labs, however after 2 attempts without any success we established that we would not be able to draw labs peripherally today. Patient has very poor venous access. Charlestine Massed, NP notified.

## 2017-04-05 NOTE — Telephone Encounter (Signed)
Debbie with [facility] [place] called asking if Dr Burr Medico will be attending. Also if she agrees life expectancy is less than 6 months. Per OV note of 03/22/17 this RN gave VO that Dr Burr Medico will be attending and her life expectancy is few weeks to few months.

## 2017-04-06 ENCOUNTER — Telehealth: Payer: Self-pay | Admitting: *Deleted

## 2017-04-06 NOTE — Telephone Encounter (Signed)
Lattie Haw, RN @ [facility] [place] called stating that pt had her hospice home visit today.   Daughters requested antidiuretic for pt's  3+ LE.   Ria Comment, NP notified.  Per Ria Comment, pt needs to have compression stockings applied to LE - pt had written script yesterday at office visit with NP.   Since phebotomist was not able to obtain blood for lab yesterday,  Ria Comment authorized only Lasix 20 mg po daily for 5 days , and no additional refill. Above explanations given to Lattie Haw, Therapist, sports at [facility] in [place].    Lattie Haw voiced understanding, and will call in Lasix to pt's pharmacy. [facility]    Phone     [phone].

## 2017-04-12 ENCOUNTER — Telehealth: Payer: Self-pay | Admitting: *Deleted

## 2017-04-12 NOTE — Telephone Encounter (Signed)
Daughter Severiano Gilbert called stating she and her daughter have FMLA forms and wanted to know who to give forms to.   Spoke with Charisse and instructed her to bring forms in and to ask for Tiffany in care management.   Informed Charisse to allow 7 - 10 working days for the process.   Charisse also wanted to know when pt's next office visit appt will be.  Informed Charisse that a scheduler will contact her with Dr. Ernestina Penna instructions.   Charisse's   Phone       [phone].

## 2017-04-12 NOTE — Telephone Encounter (Signed)
Please find out if she has enrolled to hospice. If not, I can see her the week of 7/16, or sooner if needed. Please schedule.   Truitt Merle MD

## 2017-04-12 NOTE — Telephone Encounter (Signed)
Received call from daughter, Severiano Gilbert asking about FMLA for pt's granddaughter.  Informed to bring in to Managed Care Dept.

## 2017-04-12 NOTE — Telephone Encounter (Signed)
Confirmed with daughter Severiano Gilbert that pt is currently under Hospice services.  Scheduled message sent for follow up appt with Dr. Burr Medico.

## 2017-04-18 ENCOUNTER — Telehealth: Payer: Self-pay | Admitting: Hematology

## 2017-04-18 NOTE — Telephone Encounter (Signed)
Lvm advising appt 7/20 @ 9.45am.

## 2017-04-26 NOTE — Progress Notes (Signed)
[name]  [phone] [phone]  Clinic follow up Note   Patient Care Team: [name], MD as PCP - Charissa Bash, MD as Consulting Physician (Hematology) Gardenia Phlegm, NP as Nurse Practitioner (Hematology and Oncology)  04/29/2017   CHIEF COMPLAINTS F/u metastatic breast cancer    Breast cancer of lower-outer quadrant of right female breast ([place])   09/03/2008 Initial Diagnosis    right breast cancer, T2N1Mo, stage IIA, ER+, PR+, HER2+ (ration 2.07). Pt declined surgery or other treatment.        05/20/2011 Tumor Marker    right breast mass biopsy showed IDA, ER+/PR+/HER2- (ratio 1.37)      09/04/2011 Cancer Staging    PET scan showed no distant mets      10/08/2014 Progression     lumbar spine MRI showed multiple bone metastasis , highly suspicious for  metastatic disease.      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      07/17/2015 Imaging    PET scan showed overall similar appearance of multifocal right breast cancer with extensive bone metastasis. Increased size of right pleural effusion, indeterminate for malignant involvement of the pleural fluids.      01/20/2016 Imaging    CT CAP with contrast showed small bilateral pleural effusion, right greater than left, similar osseous metastasis, no evidence of extraosseous metastasis. Bone scan showed no areas of suspicious uptake.      09/27/2016 Imaging    CT CAP w contrast 1. Heterogeneous perfusion of the liver identified on today's study with some areas of hypoattenuating nodularity. This raises  concern for development of liver metastases although this is not a definite finding today may simply reflect heterogeneous perfusion. MRI of the abdomen without and with contrast would prove helpful to further evaluate, as clinically warranted. 2. Imaging features suggest bony metastatic disease, stable in the interval. Given lack of uptake on bone scan performed earlier today, these bony changes likely reflect treated disease. 3. Persistent marked skin thickening and edema diffusely in the right breast tracking into the region of the right axilla. Not substantially changed in the interval. 4. Interval resolution of small bilateral pleural effusions with posterior right lower lobe collapse/consolidation.      10/10/2016 Imaging    MR Abdomen w/wo contrast 1. Unusual perfusion abnormality involving the liver could be related to prior radiation effect but I do not see any MR findings suspicious for hepatic metastatic disease. The portal and hepatic veins are patent. 2. Stable surgical and radiation changes involving the right breast. No significant change in 21.5 mm irregular enhancing right lateral breast lesion. 3. Diffuse osseous metastatic disease.      10/13/2016 - 10/22/2016 Radiation Therapy    SRS treatment 5 fractions to the left orbit bone metastasis with Dr. MTammi Klippel      02/01/2017 Imaging    CT CAP  IMPRESSION: Chest Impression: 1. No evidence of soft tissue metastasis in thorax. 2. Bilateral pleural effusions are increased. 3. Focus of round atelectasis RIGHT lung base.  Abdomen / Pelvis Impression: 1. No evidence of metastasis in soft tissue. 2.  Nodular liver consistent with cirrhosis. 3. Diffuse sclerotic skeletal metastasis.      02/02/2017 Imaging    Bone Scan IMPRESSION: 1. Several vague areas of increased activity in the ribs primarily the left anterior 6th or seventh rib but no CT correlate is seen to indicate definite metastatic lesion. Early metastatic  lesions cannot be excluded. 2. Increased activity along the right parietal calvarium could indicate metastatic involvement. 3. A focus of sclerosis within the right iliac bone by yesterday's CT does not demonstrate increased activity on bone scan.      02/18/2017 Imaging    MRI Abdomen 02/18/17 IMPRESSION: 1. Diffuse osseous metastatic disease, similar prior exams. 2. Diffuse abnormal signal throughout the liver probably representing a combination of macronodular hepatic cirrhosis with extensive fibrosis. The fibrotic portions are interspersed but are more prominent peripherally in the liver. No early arterial phase masslike enhancement to suggest hepatocellular carcinoma. Occasionally diffuse hepatic metastatic disease can cause a similar complex pattern, although cirrhosis is favored in this case. Sensitivity for smaller metastatic lesions to the liver is reduced due to the severity of the underlying global signal abnormality in the liver. 3. Considerable gallbladder wall thickening, although this may well be due to hypoproteinemia/hypoalbuminemia rather than necessarily being from inflammation. There is third spacing of fluid in the subcutaneous and mesenteric tissues which also suggests third spacing of fluid. 4. No biliary dilatation. 5. Chronic appearance of round atelectasis in the right lower lobe, potentially with tethering to a focus of pleural thickening and mild pleural enhancement as has been shown on multiple prior exams. Moderate to large right and moderate left pleural effusion.      02/23/2017 PET scan    PET 02/23/17 IMPRESSION: 1. Similar appearance of abnormal asymmetric skin thickening and soft tissue infiltration involving the right breast compatible with known multifocal breast cancer. Some of these changes may reflect treatment related changes. 2. No hypermetabolic soft tissue mass or adenopathy identified. 3. There is a focal area of abnormal asymmetric  increased uptake localizing to the anterior aspect of the left sixth rib. If there is a recent history of trauma to this area findings may reflect posttraumatic uptake. Metabolically active focal bone metastasis not excluded. Clinical correlation advise. 4. Persistent bilateral pleural effusions. 5. Aortic atherosclerosis        02/28/2017 Tumor Marker    ca27.29 is 806.0       03/01/2017 Imaging    [place] of liver  03/01/2017 IMPRESSION: No discrete lesions identified within the liver by ultrasound.      03/01/2017 Pathology Results    Hassan Rowan biopsy of the right lobe liver showed metastatic carcinoma, immunohistochemistry staining are positive for ER and GATA-3 with patchy week cytokeratin 7 positivity. CK 20 and GCDFP are negative. The morphology and immunophenotype are consistent with metastatic breast cancer.      03/08/2017 - 03/08/2017 Chemotherapy    Chemo carboplatin AUC 4 on day 1 (may increase to 5 from cycle 2 if tolerates well) and gemcitabine 877m/m2 on day 1, 8 every 21 days, started on 5/29, stopped after the first dose due to worsening liver function         03/15/2017 Imaging    IMPRESSION: CT CHEST IMPRESSION:  1. No pulmonary embolus. 2. Increased size of bilateral pleural effusions, now moderate in degree. There is adjacent compressive atelectasis. 3. Thoracic aortic atherosclerosis.  Coronary artery calcifications. 4. Grossly stable osseous metastatic disease.  CT ABDOMEN/PELVIS IMPRESSION:  1. Colonic wall thickening from the cecum to the  mid transverse colon suspicious for colitis. Mild small bowel thickening of pelvic small bowel loops with edematous appearance, suggesting enteritis. This may be infectious or inflammatory. 2. Heterogeneous appearance of the liver with ill-defined low-density lesions, suspicious for metastatic disease, however no hypermetabolism was seen on recent PET. Gallbladder wall thickening likely secondary to liver  disease/ascites. 3. Small volume of abdominopelvic ascites, increased from prior exam. Whole body wall edema has also increased. 4. Aortic atherosclerosis. 5. Grossly stable osseous metastatic disease      03/15/2017 Imaging     IMPRESSION: CT CHEST IMPRESSION:  1. No pulmonary embolus. 2. Increased size of bilateral pleural effusions, now moderate in degree. There is adjacent compressive atelectasis. 3. Thoracic aortic atherosclerosis.  Coronary artery calcifications. 4. Grossly stable osseous metastatic disease. CT ABDOMEN/PELVIS IMPRESSION:  1. Colonic wall thickening from the cecum to the mid transverse colon suspicious for colitis. Mild small bowel thickening of pelvic small bowel loops with edematous appearance, suggesting enteritis. This may be infectious or inflammatory. 2. Heterogeneous appearance of the liver with ill-defined low-density lesions, suspicious for metastatic disease, however no hypermetabolism was seen on recent PET. Gallbladder wall thickening likely secondary to liver disease/ascites. 3. Small volume of abdominopelvic ascites, increased from prior exam. Whole body wall edema has also increased. 4. Aortic atherosclerosis. 5. Grossly stable osseous metastatic disease.        HISTORY OF PRESENTING ILLNESS (10/30/2014):  Margaret Rubio 76 y.o. female  Was referred by her primary care physician to discuss the management probable metastatic breast cancer.  She was initially diagnosed with right breast cancer in November 2009. 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 In December of 2009, she underwent a right axillary lymph node biopsy on 09/24/2008 which showed metastatic ductal carcinoma. (Case No: [id number]) The tumor was ER 100% positive, PR 0% negative, Ki67 - 22%. 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 The ratio of HER-2:CEP 17 was  2.07.  She decided to pursue nature therapy by diet and exercise, and did not have surgery and other tranditional therapy for her breast cancer.   In May 2011 she underwent a mammogram that showed a 3.8 cm invasive duct carcinoma in the subareolar region of the right breast which had increased since November 2009.  The tumor was ER  99% positive,  PR 4% positive, HER-2 negative ( ratio 1.37).  She was seen by Dr. Estella Husk and Dr. Lucia Gaskins.  However, she failed multiple appointments and sought alternative medical care in [place]. This involved putting something on her right breast that caused some scarring.  She noticed the enlarged right breast mass and nipple inversion about one year ago. No pain, but some skin change. She developed left low back/buttock and hip and  pain in Nov 2015, was seen by PCP Dr. Jenny Reichmann who prescribed vicodin and tramadol and pain improved with meds. Due to the persistent pain, she underwent a lumber MRI  On 10/08/2014, which showed multiple bone metastasis.  She run out pain medication about a week ago,  But did not call for refill. She states her pain is not bad , but she appears to be uncomfortable when she stands up and walks.  She otherwise feels well overall,  Has good appetite and energy level. She lives alone and still functions well.  She comes in with her niece today.   Current therapy: supportive care    INTERIM HISTORY:  Ms. Pfefferle is here today for follow up. The patient denies pain. She reports she is doing well. She is eating well and juicing, and her daughter reports she has seen significant improvement in her mother. She has been able to care for herself at home, including ambulating without significant assistance and dressing herself. The patient reports occasional cough with significant white mucous production, especially in the evening when she is lying down. She also reports ongoing leg swelling; she started Lasix yesterday. She is currently under  hospice care, and is happy with the service.   MEDICAL HISTORY:  Past Medical History:  Diagnosis Date  . 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 ASTHMA 01/31/2008  . Breast cancer [facility]) dx 2009   right breast  . BREAST CANCER, HX OF 11/14/2009  . BRONCHITIS, ACUTE 01/31/2008  . Cough 10/17/2009  . Dyspnea    increased exertion   . FATIGUE 11/14/2009  . GANGLION CYST 05/01/2009  . History of blood transfusion   . HYPERLIPIDEMIA 05/21/2007  . HYPERTENSION 05/05/2007  . LOW BACK PAIN 05/21/2007  . MENOPAUSAL DISORDER 05/22/2010  . Pain in joint, lower leg 05/10/2008  . RASH-NONVESICULAR 05/01/2009  . SHOULDER PAIN, LEFT 11/14/2009  . 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 VITAMIN B12 DEFICIENCY 05/23/2010    SURGICAL HISTORY: Past Surgical History:  Procedure Laterality Date  . OTHER SURGICAL HISTORY     fibroid removal  . removal of neck lump     benigh 1986    Social History   Social History  . Marital status: Widowed    Spouse name: N/A  . Number of children: 1  . Years of education: N/A   Social History Main Topics  . Smoking status: Never Smoker  . Smokeless tobacco: Never Used  . Alcohol use No  . Drug use: No  . Sexual activity: No   Other Topics Concern  . Not on file   Social History Narrative  . No narrative on file    FAMILY HISTORY: Family History  Problem Relation Age of Onset  . Hypertension Father   . Heart disease Father   . Asthma Other   . Diabetes Other   . Hypertension Other   . Cancer Paternal Aunt        leukemia ?  Marland Kitchen Colon cancer Neg Hx   . Esophageal cancer Neg Hx   . Pancreatic cancer Neg Hx   . Stomach cancer Neg Hx   . Liver disease Neg Hx   Paternal aunt had cancer, unknown type. No other family history of malignancy   ALLERGIES:  is allergic to asa [aspirin] and tomato.  MEDICATIONS:  Current Outpatient Prescriptions on File Prior to Visit  Medication Sig Dispense Refill  . Ascorbic Acid (VITAMIN C) 100 MG CHEW Chew 1 tablet (100 mg  total) by mouth every morning. (Patient not taking: Reported on 04/05/2017) 90 each 3  . calcium carbonate 1250 MG capsule Take 1,250 mg by mouth 2 (two) times daily with a meal. Not sure of type or dose    . cholecalciferol (VITAMIN D) 1000 units tablet Take 1,000 Units by mouth daily. Patient not sure of dose    . clotrimazole (GYNE-LOTRIMIN 3) 2 % vaginal cream Apply to affected bilaterall axilla sites as needed for rash. 21 g 1  . Cyanocobalamin (B-12 COMPLIANCE INJECTION IJ) Inject as directed every 30 (thirty) days.    Marland Kitchen lactulose (CHRONULAC) 10 GM/15ML solution Take 15 mLs (10 g total) by mouth 2 (two) times daily as  needed for mild constipation. (Patient not taking: Reported on 04/05/2017) 240 mL 2  . Nystatin POWD 1 apply to right axilla twice daily until rashes resolves (Patient not taking: Reported on 04/05/2017) 1 Bottle 1  . omeprazole (PRILOSEC) 40 MG capsule Take 1 capsule (40 mg total) by mouth daily. (Patient not taking: Reported on 04/05/2017) 30 capsule 2  . ondansetron (ZOFRAN ODT) 4 MG disintegrating tablet Take 1 tablet (4 mg total) by mouth every 8 (eight) hours as needed for nausea or vomiting. (Patient not taking: Reported on 03/22/2017) 20 tablet 0  . ondansetron (ZOFRAN) 4 MG tablet Take 1 tablet (4 mg total) by mouth every 6 (six) hours. (Patient not taking: Reported on 03/22/2017) 50 tablet 2  . oxyCODONE (OXY IR/ROXICODONE) 5 MG immediate release tablet Take 1 tablet (5 mg total) by mouth every 4 (four) hours as needed for severe pain. (Patient not taking: Reported on 03/22/2017) 30 tablet 0  . potassium chloride SA (K-DUR,KLOR-CON) 20 MEQ tablet Take 1 tablet (20 mEq total) by mouth 2 (two) times daily. 60 tablet 1  . prochlorperazine (COMPAZINE) 5 MG tablet Take 1-2 tablets (5-10 mg total) by mouth every 8 (eight) hours as needed for nausea or vomiting. (Patient not taking: Reported on 03/15/2017) 30 tablet 0   No current facility-administered medications on file prior to visit.    ; REVIEW OF SYSTEMS:  Constitutional: Denies fevers, chills or abnormal night sweats (+)  Fatigue and low appetite Eyes: Denies double vision or watery eyes (+) jaundice in eyes due to liver failure (+) blurry vision in left eye  Ears, nose, mouth, throat, and face: Denies mucositis or sore throat,  Respiratory: Denies dyspnea or wheezes (+) occasional cough when lying down Cardiovascular: Denies palpitation, chest discomfort or lower extremity swelling Gastrointestinal:  Denies heartburn (+) dark urine due to Liver failure (+) nausea  Skin: Denies abnormal skin rashes  Lymphatics: Denies new lymphadenopathy or easy bruising Neurological:Denies numbness, tingling or new weaknesses Behavioral/Psych: Mood is stable, no new changes  MSK: (+)swelling of feet All other systems were reviewed with the patient and are negative.  PHYSICAL EXAMINATION:  ECOG PERFORMANCE STATUS: 2 BP 119/68 (BP Location: Left Arm, Patient Position: Sitting)   Pulse 97   Temp 97.7 F (36.5 C) (Oral)   Resp 20   Ht _0  (1.651 m)   Wt 182 lb 3.2 oz (82.6 kg)   SpO2 100%   BMI 30.32 kg/m  GENERAL:alert, no distress and comfortable SKIN: texture, turgor are normal, no significant lesions, (+) jundice EYES: normal, conjunctiva are pink and non-injected (+) has juandice in eyes  OROPHARYNX:no exudate, no erythema and lips, buccal mucosa, and tongue normal  NECK: supple, thyroid normal size, non-tender, without nodularity LYMPH:  no palpable lymphadenopathy in the cervical, axillary or inguinal LUNGS: clear to auscultation and percussion with normal breathing effort, slightly decreased breath sounds at the bottom of right lung. HEART: regular rate & rhythm and no murmurs and no lower extremity edema ABDOMEN:abdomen soft, mild tenderness in the epigastric area , normal bowel sounds  Musculoskeletal:no cyanosis of digits and no clubbing, (+) mild edema on both legs and both feet, up to knee, L>R PSYCH: alert &  oriented x 3 with fluent speech NEURO: no focal motor/sensory deficits. Her left eye ball has limited range of movement  Breasts: exam deferred     LABORATORY DATA:  I have reviewed the data as listed CBC Latest Ref Rng & Units 04/29/2017 03/29/2017 03/22/2017  WBC 3.9 -  10.3 10e3/uL 6.9 10.7(H) 7.4  Hemoglobin 11.6 - 15.9 g/dL 9.7(L) 8.7(L) 9.8(L)  Hematocrit 34.8 - 46.6 % 28.3(L) 25.3(L) 27.4(L)  Platelets 145 - 400 10e3/uL 188 287 Clumped Platelets--Appears Adequate    CMP Latest Ref Rng & Units 04/29/2017 03/29/2017 03/22/2017  Glucose 70 - 140 mg/dl 100 115 94  BUN 7.0 - 26.0 mg/dL 7.0 10.9 12.6  Creatinine 0.6 - 1.1 mg/dL 0.7 0.7 0.7  Sodium 136 - 145 mEq/L 129(L) 131(L) 133(L)  Potassium 3.5 - 5.1 mEq/L 3.7 3.2(L) 2.8(LL)  Chloride 101 - 111 mmol/L - - -  CO2 22 - 29 mEq/L _0 Calcium 8.4 - 10.4 mg/dL 8.4 8.6 8.7  Total Protein 6.4 - 8.3 g/dL 6.5 6.1(L) 6.4  Total Bilirubin 0.20 - 1.20 mg/dL 13.23(HH) 19.78(HH) 20.22(HH)  Alkaline Phos 40 - 150 U/L 430(H) 369(H) 368(H)  AST 5 - 34 U/L 118(H) 140(H) 164(H)  ALT 0 - 55 U/L 51 75(H) 67(H)   CA27.29: 06/30/2012: 28 10/30/2014: 355 08/15/2015: 424 12/27/2014: 414 05/05/2016: 416.1 06/10/2016: 332.4 07/01/2016: 348.8 08/03/2016: 388.1 09/01/2016: 321.1 10/26/16: 404.3 11/30/16: 403.7 01/03/17: 815.9 02/07/17: 857.9 02/28/2017: 806.0  03/29/17: 408.5  PATHOLOGY REPORT  Surgical pathology 03/01/2017 Diagnosis Liver, needle/core biopsy, right lobe - METASTATIC CARCINOMA - SEE COMMENT Microscopic Comment The liver biopsies have an infiltrative cellular process arranged predominantly in small nests. The patient's history of invasive ductal carcinoma is noted. By immunohistochemistry, the neoplastic cells are positive for ER and GATA-3 with patchy weak cytokeratin 7 positivity. Cytokeratin 20 and GCDFP are negative. The morphology and immunophenotype are consistent with metastatic breast carcinoma.   RIGHT BREAST MASS, 6  O'CLOCK, NEEDLE CORE BIOPSIES: 09/03/2008 MICROSCOPIC FOCUS OF DUCTAL CARCINOMA IN SITU ASSOCIATED WITH MICROSCOPIC FOCUS OF MICROINVASION. SEE COMMET.  LYMPH NODE, RIGHT AXILLARY, NEEDLE CORE BIOPSIES: METASTATIC DUCTAL CARCINOMA.  09/24/2008  Diagnosis 05/20/2011 Breast, right, needle core biopsy - INVASIVE DUCTAL CARCINOMA, SEE COMMENT. - LYMPHOVASCULAR INVASION PRESENT. Estrogen Receptor (Negative, <1%): 99%, STRONG STAINING INTENSITY Progesterone Receptor (Negative, <1%): 4%, STRONG HER-2/NEU BY CISH - NO AMPLIFICATION OF HER-2 DETECTED. THE RATIO OF HER-2: CEP 17 SIGNALS WAS 1.38.  Breast, right, needle core biopsy, 6:30 o'clock 12/13/2014 - INVASIVE MAMMARY CARCINOMA. - SEE MICROSCOPIC DESCRIPTION.  Diagnosis 08/01/2015 PLEURAL FLUID, RIGHT(SPECIMEN 1 OF 1 COLLECTED 08/01/15): METASTATIC CARCINOMA CONSISTENT WITH BREAST PRIMARY.   RADIOGRAPHIC STUDIES: I have personally reviewed the radiological images as listed and agreed with the findings in the report.  03/15/2017 CT CHEST IMPRESSION: 1. No pulmonary embolus. 2. Increased size of bilateral pleural effusions, now moderate in degree. There is adjacent compressive atelectasis. 3. Thoracic aortic atherosclerosis.  Coronary artery calcifications. 4. Grossly stable osseous metastatic disease . 03/15/2017 CT ABDOMEN/PELVIS IMPRESSION  1. Colonic wall thickening from the cecum to the mid transverse colon suspicious for colitis. Mild small bowel thickening of pelvic small bowel loops with edematous appearance, suggesting enteritis. This may be infectious or inflammatory. 2. Heterogeneous appearance of the liver with ill-defined low-density lesions, suspicious for metastatic disease, however no hypermetabolism was seen on recent PET. Gallbladder wall thickening likely secondary to liver disease/ascites. 3. Small volume of abdominopelvic ascites, increased from prior exam. Whole body wall edema has also increased. 4. Aortic  atherosclerosis. 5. Grossly stable osseous metastatic disease  CT chest, abdomen and pelvis with contrast 03/14/2017 IMPRESSION: CT CHEST IMPRESSION:  1. No pulmonary embolus. 2. Increased size of bilateral pleural effusions, now moderate in degree. There is adjacent compressive atelectasis. 3. Thoracic aortic atherosclerosis.  Coronary artery calcifications.  4. Grossly stable osseous metastatic disease. CT ABDOMEN/PELVIS IMPRESSION:  1. Colonic wall thickening from the cecum to the mid transverse colon suspicious for colitis. Mild small bowel thickening of pelvic small bowel loops with edematous appearance, suggesting enteritis. This may be infectious or inflammatory. 2. Heterogeneous appearance of the liver with ill-defined low-density lesions, suspicious for metastatic disease, however no hypermetabolism was seen on recent PET. Gallbladder wall thickening likely secondary to liver disease/ascites. 3. Small volume of abdominopelvic ascites, increased from prior exam. Whole body wall edema has also increased. 4. Aortic atherosclerosis. 5. Grossly stable osseous metastatic disease.   ASSESSMENT & PLAN:   Mrs. Polzin is a 76 y.o. African-American female, who was diagnosed with node positive right breast  Invasive adenocarcinoma in  2009, untreated,  Now presents with low back pain and lumbar spine MRI is highly suspicious for bone metastasis.  1. 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 -Liver MRI on 10/10/16 showed unusual perfusion abnormality involving the liver, but no definite evidence of metastasis. -repeated staging CT scan on 02/01/2017 did not show evidence of liver metastasis, but nodular liver consistent with liver cirrhosis, she has no history of liver disease in the  past, hepatitis B and C were negative, she does not drink alcohol much. -Ultrasound abdomen this morning showed nodular liver suspicious for liver cirrhosis. No bili obstruction.  -I previously referred her to GI, abd MRI was done which showed liver cirrhosis, no image evidence of liver metastasis -I previously discussed her restaging PET scan from 02/23/2017, which showed hypermetabolic right breast cancer, otherwise negative, no hypermetabolic lesions in the liver -I have held anastrozole and Ibrance since early May however her liver function continues getting worse,  -Given her underlying metastatic breast cancer, infiltrative liver metastasis is likely,  I strongly recommend patient to have liver biopsy.  - I previously reviewed the liver biopsy results with patient and her daughter, it confirms metastatic cancer from breast. -  Patient will continue hospice care and follow up prn in the future. -Repeated labs showed some improvement in her liver function, total bilirubin 13 today. She is clinically doing better.  3.  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 -she knows that her disease at this stage is incurable. The goal of treatment is palliative and to prolong her life. -she is on first line anastrozole, Leslee Home was added on in October 2016 due to slight disease progression. She was not very compliant, off Ibrance for 3-4 months and restarted in 01/2016. -I previously discussed her restaging CT and bone scan from 05/31/2016, which showed diffuse similar appearing bone lesions,  no other new metastasis. Bone scan showed mild bilateral rib uptake, but no corresponding findings on the CT. I think she has stable disease overall. -I previously reviewed her restaging CT scan and bone scan from 09/27/2016, which showed stable bone mets. CT scan showed heterogeneous perfusion of the liver indeterminate -I previously reviewed MR abdomen scan from  10/10/2016 with the patient, which showed no evidence of metastatic disease in liver  -She has developed worsening liver function, especially upper bilirubinemia, highly suspicious for diffuse liver metastasis, although this no evidence on the recent MRI or PET -Liver biopsy confirmed diffuse metastasis from breast cancer -She previously stared the practice of chemotherapy with gemcitabine and carboplatin, tolerated the first week treatment moderately well -Chemotherapy was held after 1 dose, due to worsening liver function. -She is currently on hospice care, seems doing better overall, likely recover December from chemotherapy. Repeated lab showed improved liver functions, total bili is still significantly elevated at 13. -We discussed that if she continues to improve and wishes to try more cancer treatment, we may consider try antiestrogen therapy with fulvestrant if her LFTs get better.  -she will continue hospice for now, and see me as needed   4. Left orbit bone metastasis  -this was found on her brain MRI in 06/2015 when she had heaache, she has issues with left eye ball movement, was evaluated by Dr. Emilio Math at [facility] -this is likely a metastatic lesion from her breast cancer -I previously discussed her orbital MRI scan findings, which showed diffuse metastases to status is within the left orbital with invading the left extraocular muscles. No optical nerve involvement. -She previously completed treatment with radiation oncologist Dr. Tammi Klippel, who delivered 5 fractions of SRS to her left orbit bone metastasis    5. Anemia -Likely  secondary to her breast cancer, bone metastases and B12 deficiency -Slightly improved after B12 injections. Continue B12 monthly for now. -No need for transfusion. We'll follow up closely. -The patient was previously neutropenic because she took 2 extra doses of Ibrance.   6. Goal of care discussion  -We again had long conversation today about teh incurable nature of her cancer and extremely poor prognosis, due to her worsening liver failure -The patient understands the goal of care is palliative. -I recommend do not resuscitation. She has agreed DNR/DNI. Her daughter is supportive to her decision.  -She is under hospice care now   PLAN - Patient will have labs drawn today. - The patient will contact the clinic if she begins feeling better and would like to pursue additional treatment. - She will follow up with me as needed. She will continue with hospice care.  All questions were answered. The patient knows to call the clinic with any problems, questions or concerns.  I spent 20 minutes counseling the patient face to face. The total time spent in the appointment was 25 minutes and more than 50% was on counseling.  This document serves as a record of services personally performed by Truitt Merle, MD. It was created on her behalf by Maryla Morrow, a trained medical scribe. The creation of this record is based on the scribe's personal observations and the provider's statements to them. This document has been checked and approved by the attending provider.    Truitt Merle, MD 04/29/2017

## 2017-04-29 ENCOUNTER — Telehealth: Payer: Self-pay | Admitting: Hematology

## 2017-04-29 ENCOUNTER — Ambulatory Visit (HOSPITAL_BASED_OUTPATIENT_CLINIC_OR_DEPARTMENT_OTHER)

## 2017-04-29 ENCOUNTER — Ambulatory Visit (HOSPITAL_BASED_OUTPATIENT_CLINIC_OR_DEPARTMENT_OTHER): Payer: Medicare HMO | Admitting: Hematology

## 2017-04-29 VITALS — BP 119/68 | HR 97 | Temp 97.7°F | Resp 20 | Ht 65.0 in | Wt 182.2 lb

## 2017-04-29 DIAGNOSIS — R740 Nonspecific elevation of levels of transaminase and lactic acid dehydrogenase [LDH]: Secondary | ICD-10-CM | POA: Diagnosis not present

## 2017-04-29 DIAGNOSIS — D519 Vitamin B12 deficiency anemia, unspecified: Secondary | ICD-10-CM

## 2017-04-29 DIAGNOSIS — D630 Anemia in neoplastic disease: Secondary | ICD-10-CM | POA: Diagnosis not present

## 2017-04-29 DIAGNOSIS — C7951 Secondary malignant neoplasm of bone: Secondary | ICD-10-CM | POA: Diagnosis not present

## 2017-04-29 DIAGNOSIS — C50511 Malignant neoplasm of lower-outer quadrant of right female breast: Secondary | ICD-10-CM

## 2017-04-29 DIAGNOSIS — J910 Malignant pleural effusion: Secondary | ICD-10-CM | POA: Diagnosis not present

## 2017-04-29 DIAGNOSIS — Z170 Estrogen receptor positive status [ER+]: Principal | ICD-10-CM

## 2017-04-29 DIAGNOSIS — C50911 Malignant neoplasm of unspecified site of right female breast: Secondary | ICD-10-CM

## 2017-04-29 DIAGNOSIS — I10 Essential (primary) hypertension: Secondary | ICD-10-CM

## 2017-04-29 LAB — COMPREHENSIVE METABOLIC PANEL
ALT: 51 U/L (ref 0–55)
AST: 118 U/L — AB (ref 5–34)
Albumin: 2.1 g/dL — ABNORMAL LOW (ref 3.5–5.0)
Alkaline Phosphatase: 430 U/L — ABNORMAL HIGH (ref 40–150)
Anion Gap: 9 mEq/L (ref 3–11)
BUN: 7 mg/dL (ref 7.0–26.0)
CALCIUM: 8.4 mg/dL (ref 8.4–10.4)
CHLORIDE: 98 meq/L (ref 98–109)
CO2: 22 mEq/L (ref 22–29)
Creatinine: 0.7 mg/dL (ref 0.6–1.1)
EGFR: >90 mL/min/{1.73_m2} (ref >90)
GLUCOSE: 100 mg/dL (ref 70–140)
POTASSIUM: 3.7 meq/L (ref 3.5–5.1)
SODIUM: 129 meq/L — AB (ref 136–145)
Total Bilirubin: 13.23 mg/dL (ref 0.20–1.20)
Total Protein: 6.5 g/dL (ref 6.4–8.3)

## 2017-04-29 LAB — CBC WITH DIFFERENTIAL/PLATELET
BASO%: 0.3 % (ref 0.0–2.0)
BASOS ABS: 0 10*3/uL (ref 0.0–0.1)
EOS%: 0.3 % (ref 0.0–7.0)
Eosinophils Absolute: 0 10*3/uL (ref 0.0–0.5)
HCT: 28.3 % — ABNORMAL LOW (ref 34.8–46.6)
HGB: 9.7 g/dL — ABNORMAL LOW (ref 11.6–15.9)
LYMPH%: 32.9 % (ref 14.0–49.7)
MCH: 31.7 pg (ref 25.1–34.0)
MCHC: 34.3 g/dL (ref 31.5–36.0)
MCV: 92.5 fL (ref 79.5–101.0)
MONO#: 0.7 10*3/uL (ref 0.1–0.9)
MONO%: 10.1 % (ref 0.0–14.0)
NEUT#: 3.9 10*3/uL (ref 1.5–6.5)
NEUT%: 56.4 % (ref 38.4–76.8)
Platelets: 188 10*3/uL (ref 145–400)
RBC: 3.06 10*6/uL — ABNORMAL LOW (ref 3.70–5.45)
RDW: 15.3 % — ABNORMAL HIGH (ref 11.2–14.5)
WBC: 6.9 10*3/uL (ref 3.9–10.3)
lymph#: 2.3 10*3/uL (ref 0.9–3.3)

## 2017-04-29 NOTE — Telephone Encounter (Signed)
Scheduled appt per 7/201 los - lab today and f/u as needed - Gave patient AVS

## 2017-04-30 LAB — CANCER ANTIGEN 27.29: CA 27.29: 282.5 U/mL — ABNORMAL HIGH (ref 0.0–38.6)

## 2017-05-01 ENCOUNTER — Encounter: Payer: Self-pay | Admitting: Hematology

## 2017-05-03 ENCOUNTER — Telehealth: Payer: Self-pay | Admitting: *Deleted

## 2017-05-03 NOTE — Telephone Encounter (Signed)
"  I need a letter from Dr. Burr Medico stating I am my mom's caregiver.  She lives in senior home apartments, [facility] [employer].  Came in May.  Moving here to be her caregiver since her fall.  They only allow someone here two weeks maximum.  A letter will allow me to remain here to take care of her all day, everyday.  Return number [phone]."

## 2017-05-04 ENCOUNTER — Encounter: Payer: Self-pay | Admitting: Hematology

## 2017-05-04 NOTE — Telephone Encounter (Signed)
Letter is ready for her, thanks   Truitt Merle MD

## 2017-05-05 NOTE — Telephone Encounter (Signed)
Notified patient daughter, letter is ready.  "I'll come now for pick up."

## 2017-05-11 ENCOUNTER — Telehealth: Payer: Self-pay | Admitting: *Deleted

## 2017-05-11 NOTE — Telephone Encounter (Signed)
Judeen Hammans, RN from [facility] called reporting that pt has ankles and feet edema  2 - 3 + .  Pt had tried : Lasix  20 mg  X 5 days  -  No relief. Lasix  40 mg  X 5 days  -  Little better. Judeen Hammans wanted to know what Dr. Burr Medico would recommend for pt. Sherry's    Phone     [phone].

## 2017-05-19 ENCOUNTER — Telehealth: Payer: Self-pay | Admitting: *Deleted

## 2017-05-19 NOTE — Telephone Encounter (Signed)
Spoke with daughter Severiano Gilbert, and was informed re:  Pt needs a letter of harship - stating : 1.    Stage of cancer. 2.    Pt has illness with disability and her current state of health. Charisse needs this letter to prevent foreclosure on pt's house.  Charisse stated the family has decided to give the house back to the bank so pt will not be responsible for the foreclosure. Charisse's   Phone      [phone].

## 2017-05-23 ENCOUNTER — Telehealth: Payer: Self-pay

## 2017-05-23 NOTE — Telephone Encounter (Signed)
Daughter called for 1) she needs a letter stating what mother's condition is. They will be giving the pt's house back to the bank. This is f/u from 8/9 phone note  2) can she travel to [place] to visit

## 2017-05-24 ENCOUNTER — Telehealth: Payer: Self-pay | Admitting: *Deleted

## 2017-05-24 NOTE — Telephone Encounter (Signed)
"  Margaret Rubio [phone]) calling for status of letter requested last week with my mom's condition.  Trying to take care of her financial situation.  The bank will hold her responsible for the foreclosure of the home unless we provide a Letter of Hardship 'To whom it may concern' with her diagnosis, stage, condition indicating she is not able to manage or care for a home.  She has stage IV cancer, in a senior facility.  Hospice is involved and she doesn't have long to live.  Family providing care.  She is unable to get to the bathroom without assistance of her daughters.  We needed the letter last week."  Please call me when letter is ready."  Noted 05-20-2015 phone documentation.  However today's communication reveals mortgage payment is due despite foreclosure.  Family needs to quickly clear mom of this debt.  This nurse understands a "Deficiency Judgement" after foreclosure for property worth in effect for lender to obtain amount owed on mortgage from [name] which is beyond house worth at time of foreclosure.

## 2017-05-26 ENCOUNTER — Other Ambulatory Visit: Payer: Self-pay | Admitting: Oncology

## 2017-05-26 NOTE — Progress Notes (Unsigned)
I was called today by hospice nurse Grant (from [facility]) regarding this patient. She tells me that they called here yesterday and obtained an order for Roxanol which they have on hand, but that the family does not want to give the patient morphine. They are requesting oxycodone.  This nurse does not know the patient or the patient's family personally; she does not know if there is drug diversion going on; she could not give me a reason why the patient could not receive morphine. She says the family just doesn't want to do it because the patient at some point did not want to receive morphine.  I do not find Roxanol in the patient's list of medications and I do not find a note here yesterday or at any time recently requesting Roxanol or writing Roxanol for the patient.  This is not a case that I am acquainted with. I am concerned that this request really needs to be better evaluated either by home visit or by someone who knows the family. I asked the nurse to call her hospice director for assistance.

## 2017-05-27 ENCOUNTER — Telehealth: Payer: Self-pay | Admitting: *Deleted

## 2017-05-27 NOTE — Telephone Encounter (Signed)
Lattie Haw, RN @ [facility] called informing Dr. Burr Medico re:  Pt is actively dying - has been taking Roxanol 5 - 10 mg every 2 hours for SOB, pain.  Yesterday, there was a conflict between daughters about pt taking Roxanol.   Daughters requested pain med to be changed to Oxyfast  5 - 10 mg every 2 hours for comfort.   Dr. Burr Medico notified.  Spoke with hospice nurse Lattie Haw, and gave her ok verbal order to switch pain med as per Dr. Burr Medico. Lisa's    Phone     [phone].

## 2017-05-29 ENCOUNTER — Other Ambulatory Visit: Payer: Self-pay | Admitting: Oncology

## 2017-06-01 ENCOUNTER — Other Ambulatory Visit: Payer: Self-pay | Admitting: *Deleted

## 2017-06-01 MED ORDER — OXYCODONE HCL 20 MG/ML PO CONC: ORAL | 0 refills | Status: AC

## 2017-06-02 ENCOUNTER — Telehealth: Payer: Self-pay | Admitting: *Deleted

## 2017-06-02 NOTE — Telephone Encounter (Signed)
Received vm call from pt's daughter, Shona Needles stating that pt passed Tues hs & would like to speak with Dr Burr Medico. Message given to Dr Burr Medico.  Call back # is [phone].

## 2017-06-02 NOTE — Telephone Encounter (Signed)
I called pt's daughter Severiano Gilbert back, and expressed my condolences to her. Patient received great care from hospice program, and passed away peacefully at home. Charisse expressed her great appreciate to our care to her mother, and was grateful that we referred her to hospice program.   Truitt Merle  06/02/2017

## 2017-06-11 DEATH — deceased

## 2017-07-16 ENCOUNTER — Other Ambulatory Visit: Payer: Self-pay | Admitting: Nurse Practitioner

## 2017-12-12 IMAGING — PT NM PET TUM IMG RESTAG (PS) SKULL BASE T - THIGH
8 series · 20 of 25 positions shown · non-contrast
Comparison: 07/17/2015

CLINICAL DATA: Subsequent treatment strategy for breast cancer.

EXAM:
NUCLEAR MEDICINE PET SKULL BASE TO THIGH
TECHNIQUE: 9.7 mCi F-18 FDG was injected intravenously. Full-ring PET imaging
was performed from the skull base to thigh after the radiotracer. CT
data was obtained and used for attenuation correction and anatomic
localization.
FASTING BLOOD GLUCOSE:  Value: 92 mg/dl

[Series 3: pet sk_thigh ac · axial · 5.0mm · 4.07mm/px · z∈[-508,+372]mm · 3 of 221 slices shown]
[im 1/221]
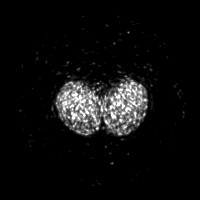
[im 74/221]
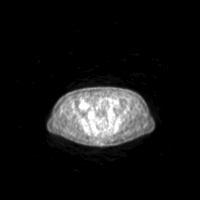
[im 221/221]
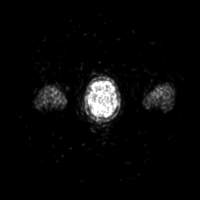

[Series 4: ct sk_thigh 5.0 b31f · axial · 5.0mm · 0.98mm/px · z∈[-508,+372]mm · 4 of 221 slices shown]
[im 1/221  brain]
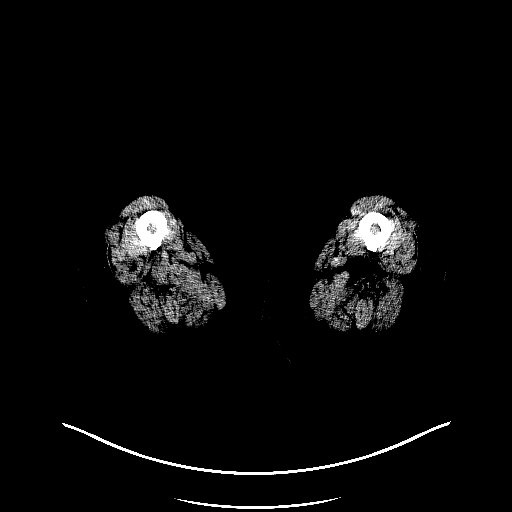
[im 56/221]
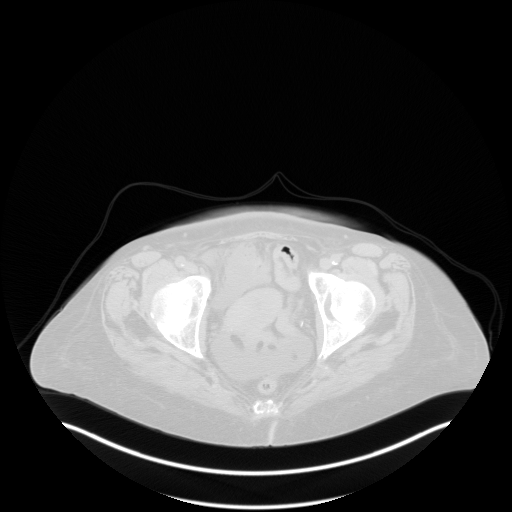
[im 111/221  brain]
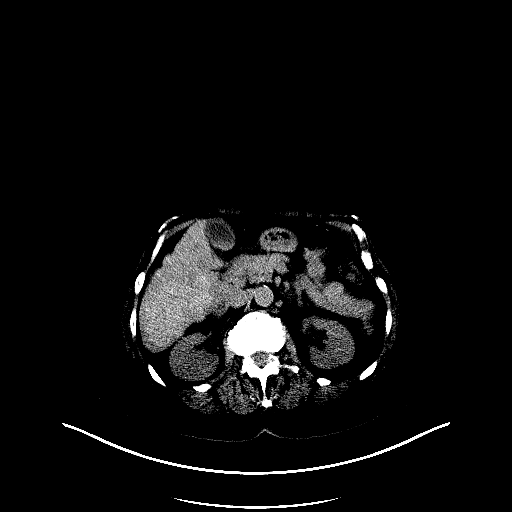
[im 221/221  brain]
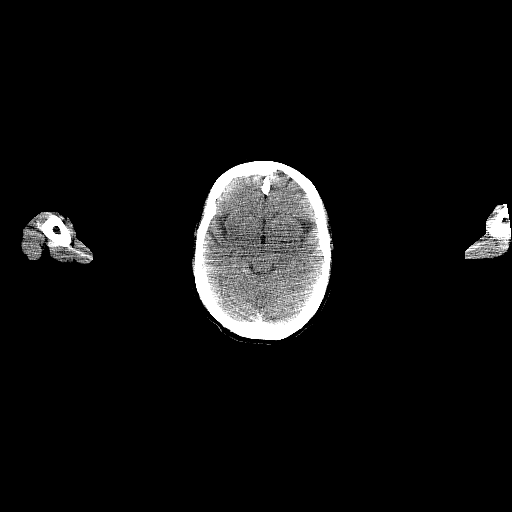

[Series 7: pet sk_thigh nac · axial · 5.0mm · 4.07mm/px · z∈[-508,+372]mm · 4 of 221 slices shown]
[im 1/221]
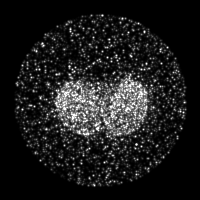
[im 56/221]
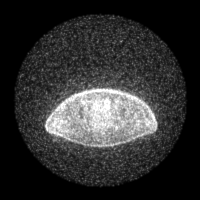
[im 111/221]
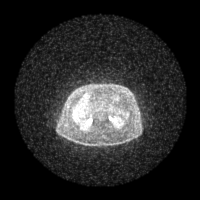
[im 221/221]
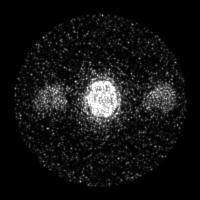

[Series 8: ct sk_thigh 5.0 b70f (id)_bone · axial · 5.0mm · 0.62mm/px · z∈[-24,+240]mm · 2 of 67 slices shown]
[im 1/67  bone]
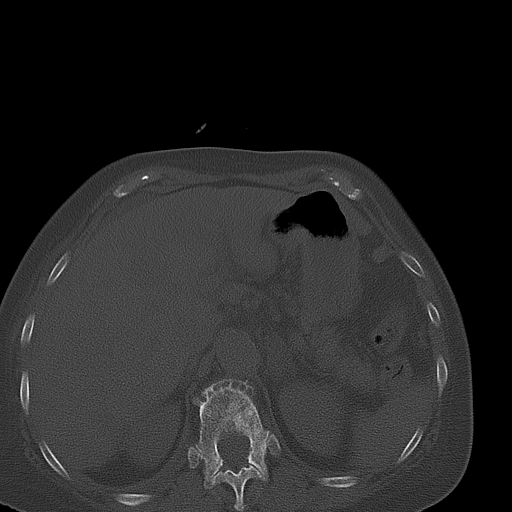
[im 67/67  bone]
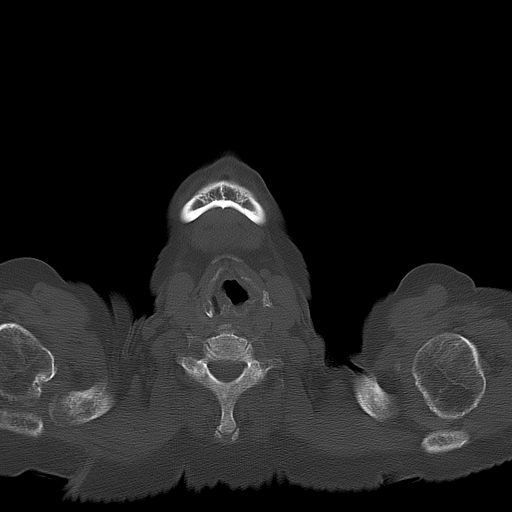

[Series 603: mip collection · coronal · 1.83mm/px · 1 of 32 slices shown]
[im 1/32]
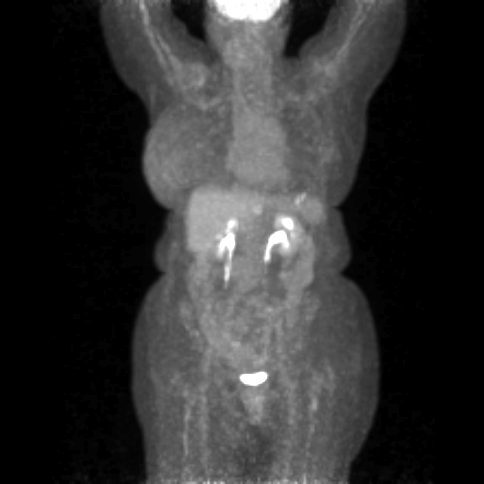

[Series 604: range-ct sk_thigh 5.0 (id)<alpha range> · 1 of 70 slices shown (1 of 2)]
[im 70/70]
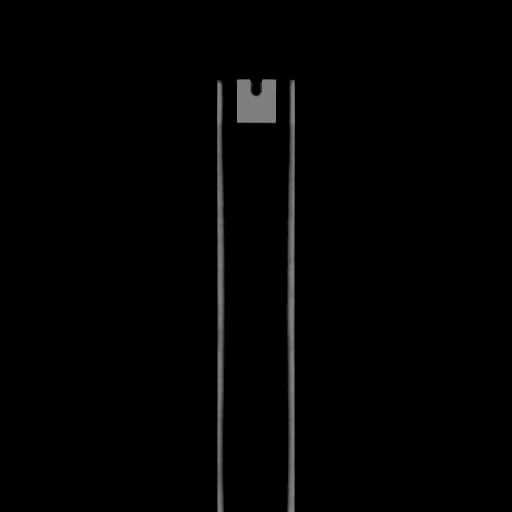

[Series 605: range-ct sk_thigh 5.0 (id)<alpha range> · 4 of 204 slices shown (2 of 2)]
[im 1/204]
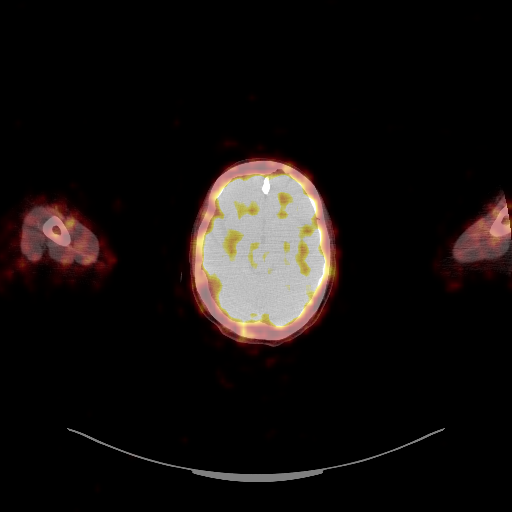
[im 51/204]
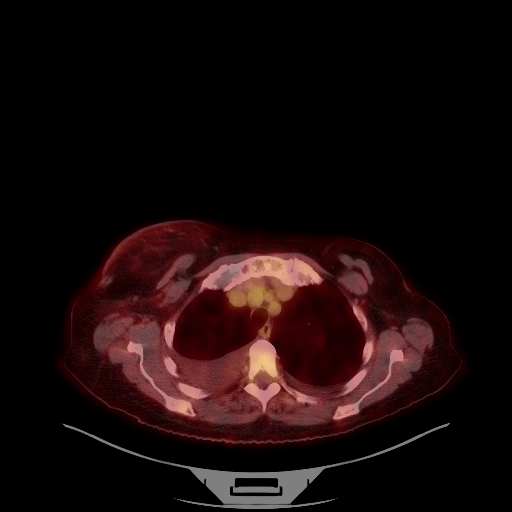
[im 102/204]
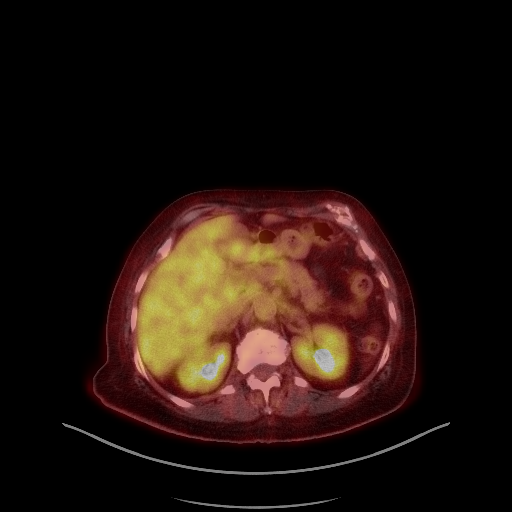
[im 204/204]
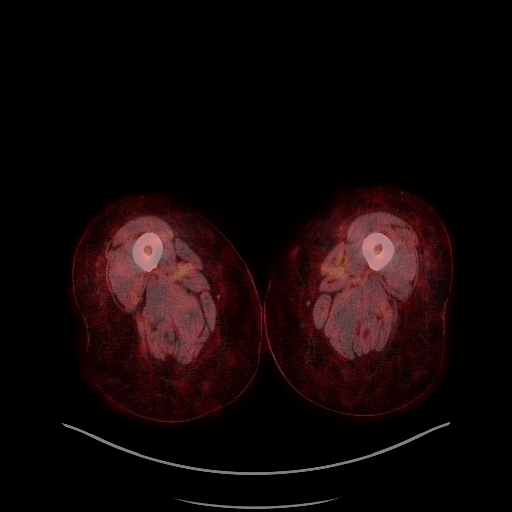

[Series 1032: results mm oncology reading · 0.50mm/px · 1 of 1 slices shown]
[im 1/1]
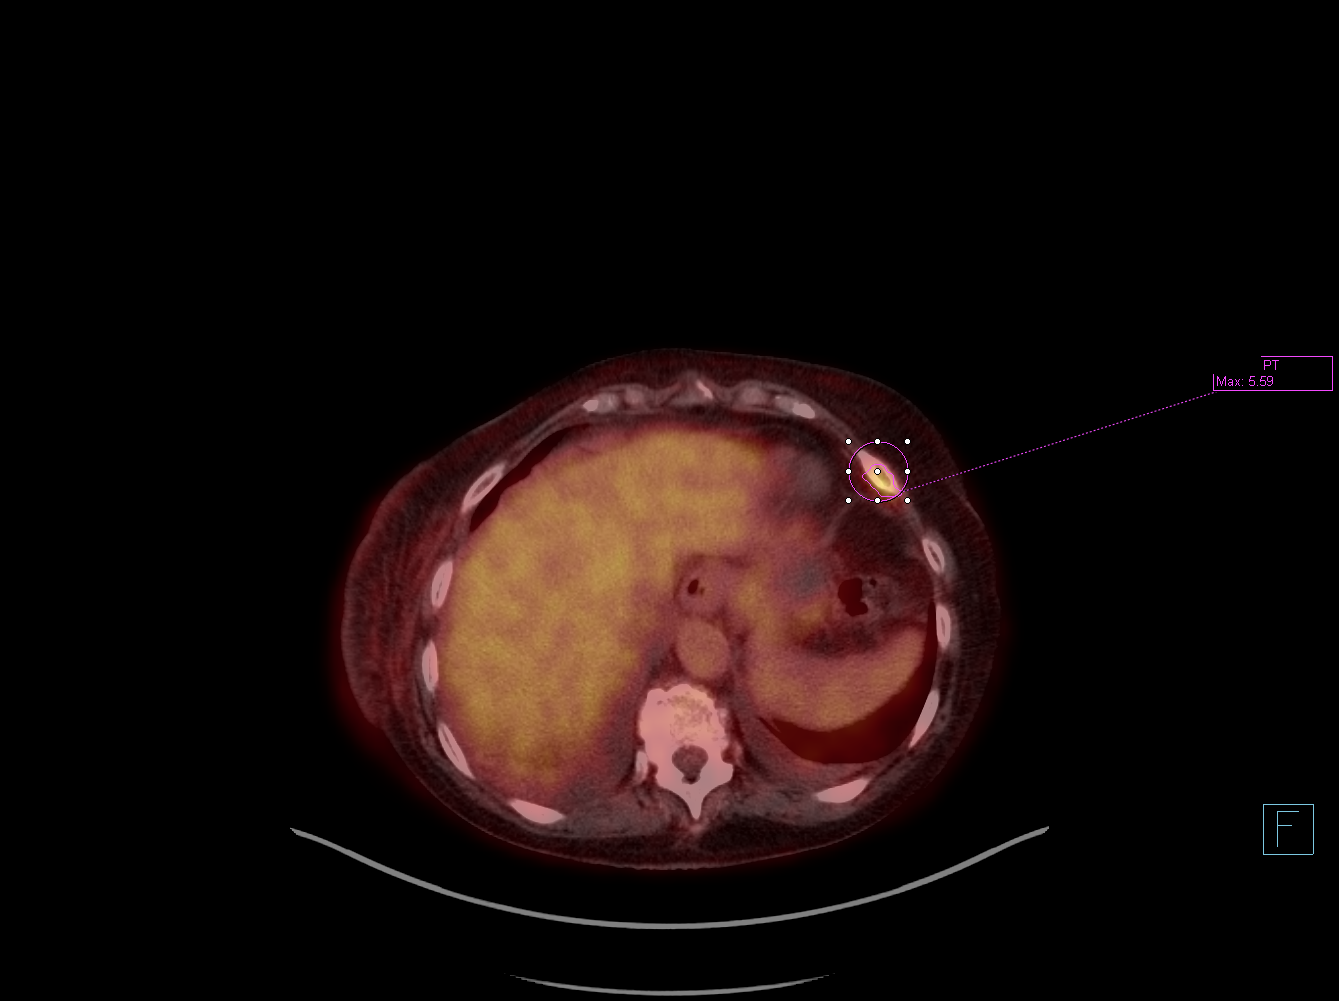

[20 of 25 positions shown; findings below may reference images not displayed]

FINDINGS: NECK

No hypermetabolic lymph nodes in the neck.

CHEST

There is diffuse asymmetric skin thickening and subcutaneous soft
tissue stranding/infiltration throughout the right breast. The
appearance is similar to 07/17/2015. No focal hypermetabolic mass
identified. No hypermetabolic axillary or supraclavicular
adenopathy.

No hypermetabolic mediastinal or hilar lymph nodes. Aortic
atherosclerosis noted.

Small bilateral pleural effusions are identified, right greater than
left. No hypermetabolic pulmonary nodule or mass.

ABDOMEN/PELVIS

Macro nodular appearance of the liver compatible with cirrhosis. No
focal areas of hypermetabolism to suggest metastatic disease. The
gallbladder is normal. No biliary dilatation. Unremarkable
appearance of the pancreas, spleen and adrenal glands. No
hypermetabolic abdominal or pelvic adenopathy. No hypermetabolic
inguinal adenopathy. Aortic atherosclerosis noted. No aneurysm.

SKELETON

Bone metastases are identified. Most of these are non FDG avid with
the exception of a focal area of hypermetabolism localizing to
anterior aspect of the left 6 rib. SUV max equals
IMPRESSION: 1. Similar appearance of abnormal asymmetric skin thickening and
soft tissue infiltration involving the right breast compatible with
known multifocal breast cancer. Some of these changes may reflect
treatment related changes.
2. No hypermetabolic soft tissue mass or adenopathy identified.
3. There is a focal area of abnormal asymmetric increased uptake
localizing to the anterior aspect of the left sixth rib. If there is
a recent history of trauma to this area findings may reflect
posttraumatic uptake. Metabolically active focal bone metastasis not
excluded. Clinical correlation advise.
4. Persistent bilateral pleural effusions.
5. Aortic atherosclerosis

## 2018-01-01 IMAGING — CR DG ABDOMEN ACUTE W/ 1V CHEST
3 series · 3 of 3 positions shown · non-contrast
Comparison: 02/01/2017 chest CT

CLINICAL DATA: Mid abdominal pain with nausea 24 hours ago

EXAM:
DG ABDOMEN ACUTE W/ 1V CHEST

[w abdomen decub]
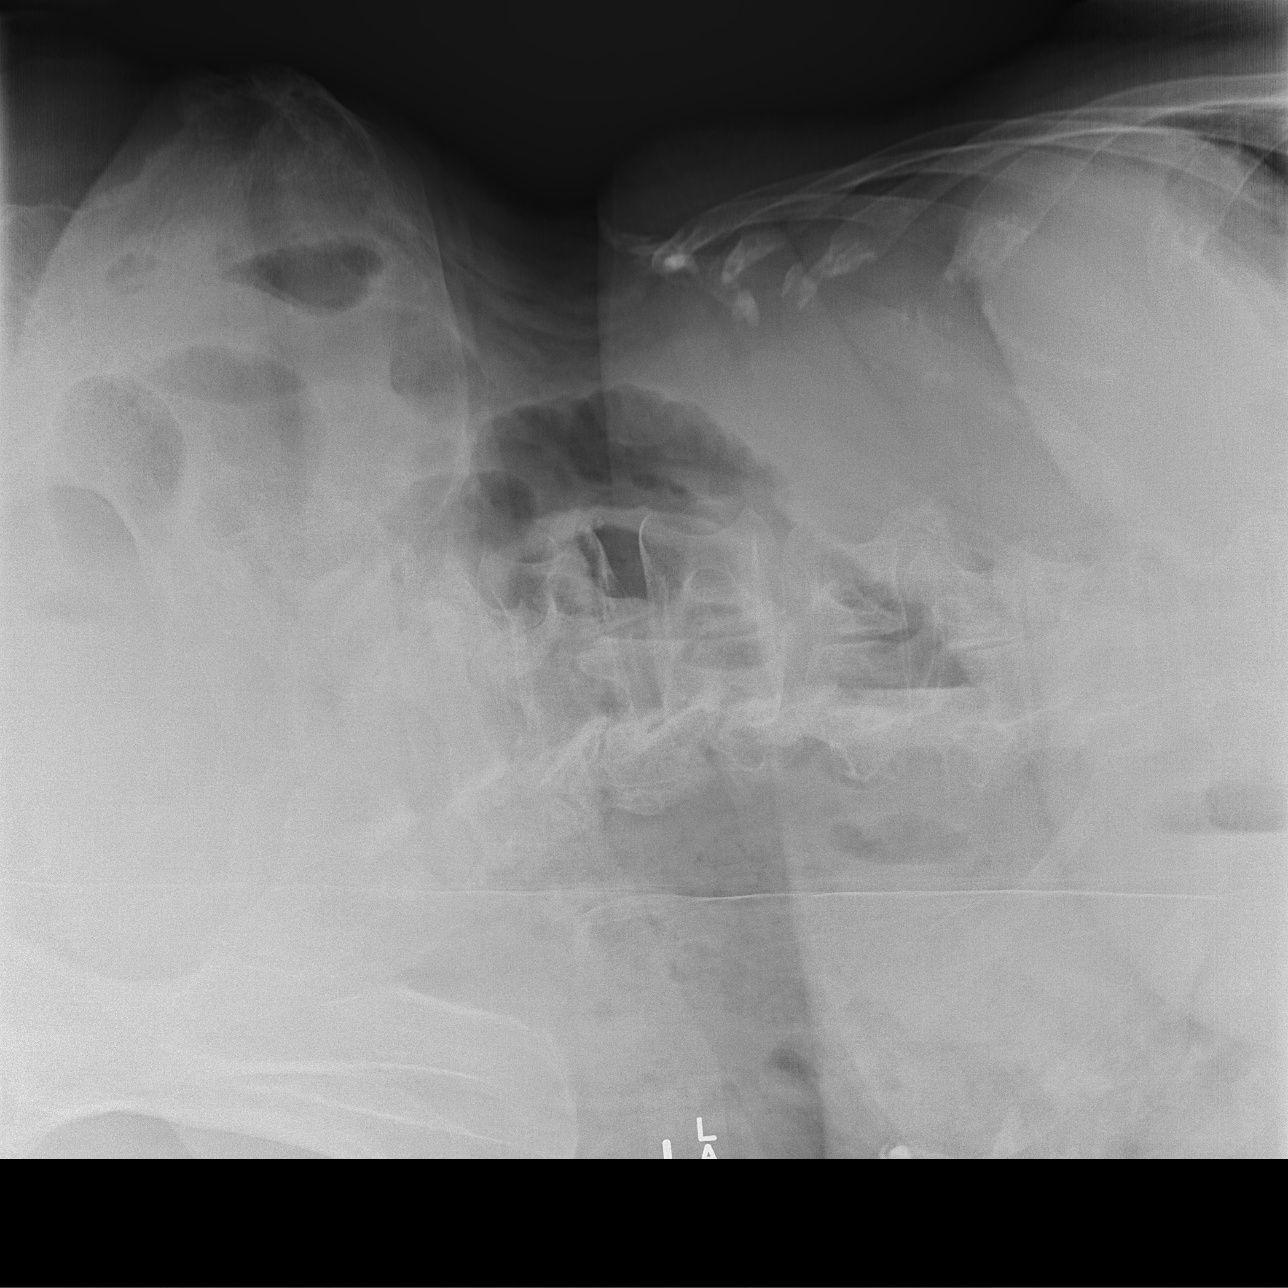

[x abdomen supine]
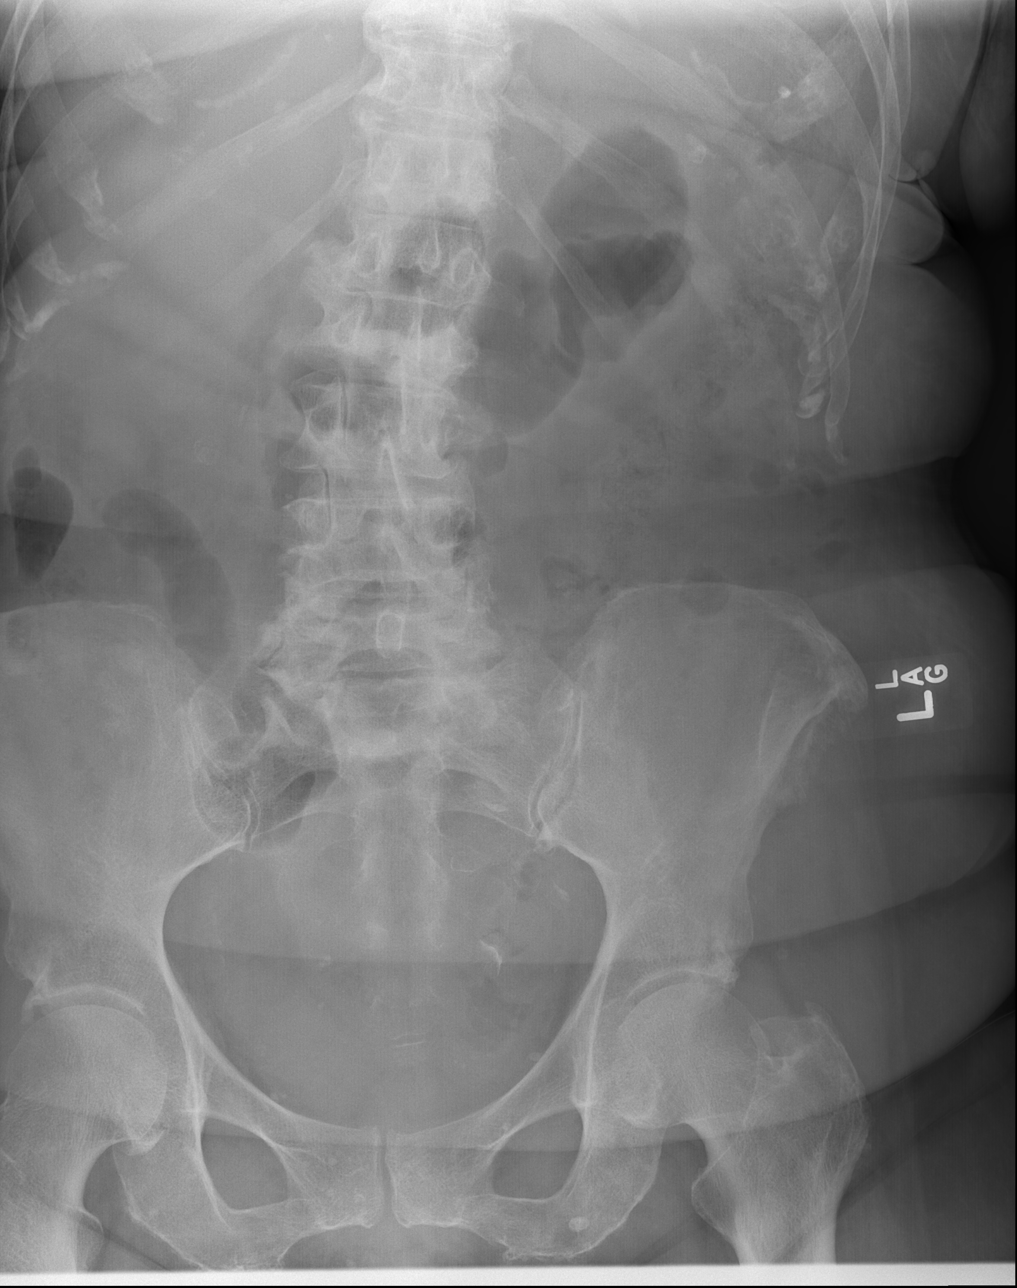

[x chest ap]
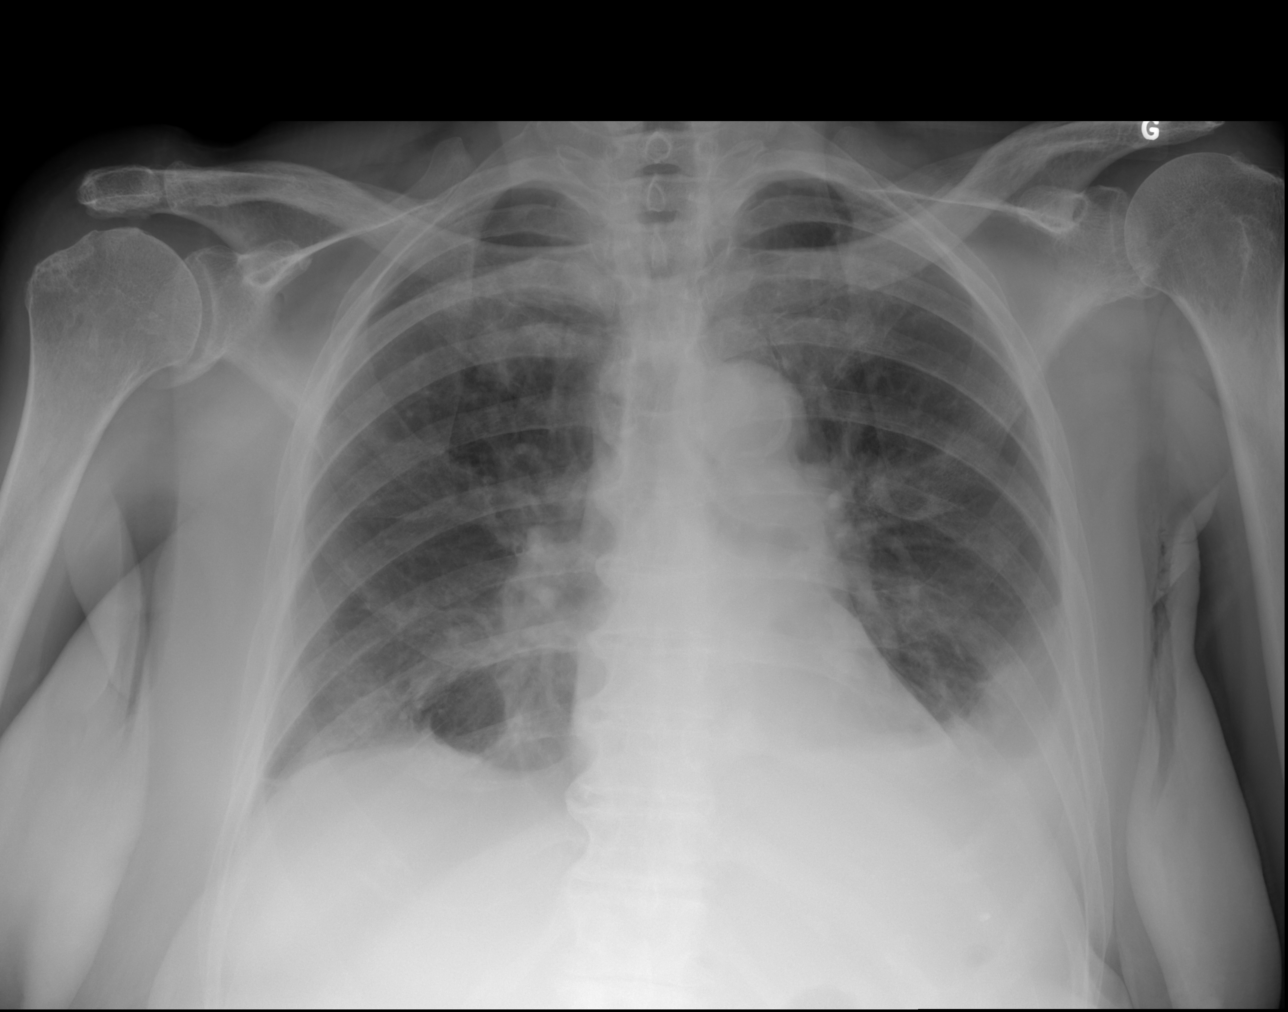

[3 of 3 positions shown; findings below may reference images not displayed]

FINDINGS: Top normal-sized cardiac silhouette with aortic atherosclerosis.
Mild pulmonary vascular congestion with left greater than right
pleural effusions. Osteoarthritis of the AC and glenohumeral joints.
No acute nor suspicious osseous abnormality.
IMPRESSION: Mild pulmonary vascular congestion with bilateral left greater than
right pleural effusions. Aortic atherosclerosis.
